# Patient Record
Sex: Female | Born: 1940 | Race: White | Hispanic: No | Smoking: Never smoker
Health system: Southern US, Community
[De-identification: ages and names within clinical notes are randomized; demographics above are authoritative.]

## PROBLEM LIST (undated history)

## (undated) DIAGNOSIS — D568 Other thalassemias: Secondary | ICD-10-CM

## (undated) DIAGNOSIS — Z9289 Personal history of other medical treatment: Secondary | ICD-10-CM

## (undated) DIAGNOSIS — E785 Hyperlipidemia, unspecified: Secondary | ICD-10-CM

## (undated) DIAGNOSIS — I350 Nonrheumatic aortic (valve) stenosis: Secondary | ICD-10-CM

## (undated) DIAGNOSIS — R0600 Dyspnea, unspecified: Secondary | ICD-10-CM

## (undated) DIAGNOSIS — I10 Essential (primary) hypertension: Secondary | ICD-10-CM

## (undated) DIAGNOSIS — D649 Anemia, unspecified: Secondary | ICD-10-CM

## (undated) DIAGNOSIS — Z85118 Personal history of other malignant neoplasm of bronchus and lung: Secondary | ICD-10-CM

## (undated) DIAGNOSIS — Z8489 Family history of other specified conditions: Secondary | ICD-10-CM

## (undated) DIAGNOSIS — M818 Other osteoporosis without current pathological fracture: Secondary | ICD-10-CM

## (undated) DIAGNOSIS — F419 Anxiety disorder, unspecified: Secondary | ICD-10-CM

## (undated) HISTORY — PX: LUMBAR DISC SURGERY: SHX700

## (undated) HISTORY — PX: PARTIAL HYSTERECTOMY: SHX80

## (undated) HISTORY — DX: Hyperlipidemia, unspecified: E78.5

## (undated) HISTORY — DX: Other osteoporosis without current pathological fracture: M81.8

## (undated) HISTORY — DX: Personal history of other malignant neoplasm of bronchus and lung: Z85.118

## (undated) HISTORY — DX: Nonrheumatic aortic (valve) stenosis: I35.0

## (undated) HISTORY — DX: Other thalassemias: D56.8

## (undated) HISTORY — DX: Essential (primary) hypertension: I10

## (undated) HISTORY — PX: INGUINAL HERNIA REPAIR: SUR1180

## (undated) HISTORY — PX: COLONOSCOPY: SHX174

---

## 1998-03-20 ENCOUNTER — Ambulatory Visit: Admission: RE | Admit: 1998-03-20 | Discharge: 1998-03-20 | Payer: Self-pay | Admitting: Internal Medicine

## 1999-09-17 ENCOUNTER — Ambulatory Visit (HOSPITAL_COMMUNITY): Admission: RE | Admit: 1999-09-17 | Discharge: 1999-09-17 | Payer: Self-pay | Admitting: Internal Medicine

## 1999-09-17 ENCOUNTER — Encounter: Payer: Self-pay | Admitting: Internal Medicine

## 2001-04-01 ENCOUNTER — Encounter: Payer: Self-pay | Admitting: Internal Medicine

## 2001-04-01 ENCOUNTER — Ambulatory Visit (HOSPITAL_COMMUNITY): Admission: RE | Admit: 2001-04-01 | Discharge: 2001-04-01 | Payer: Self-pay | Admitting: Internal Medicine

## 2001-08-25 ENCOUNTER — Emergency Department (HOSPITAL_COMMUNITY): Admission: EM | Admit: 2001-08-25 | Discharge: 2001-08-26 | Payer: Self-pay | Admitting: Emergency Medicine

## 2001-08-26 ENCOUNTER — Encounter: Payer: Self-pay | Admitting: Emergency Medicine

## 2002-08-24 ENCOUNTER — Ambulatory Visit (HOSPITAL_COMMUNITY): Admission: RE | Admit: 2002-08-24 | Discharge: 2002-08-24 | Payer: Self-pay | Admitting: Internal Medicine

## 2002-08-24 ENCOUNTER — Encounter: Payer: Self-pay | Admitting: Internal Medicine

## 2003-11-23 ENCOUNTER — Encounter: Payer: Self-pay | Admitting: Internal Medicine

## 2003-11-23 ENCOUNTER — Ambulatory Visit (HOSPITAL_COMMUNITY): Admission: RE | Admit: 2003-11-23 | Discharge: 2003-11-23 | Payer: Self-pay | Admitting: Internal Medicine

## 2004-09-11 ENCOUNTER — Ambulatory Visit (HOSPITAL_COMMUNITY): Admission: RE | Admit: 2004-09-11 | Discharge: 2004-09-11 | Payer: Self-pay | Admitting: Infectious Diseases

## 2006-10-13 LAB — HM COLONOSCOPY: HM Colonoscopy: NEGATIVE

## 2006-10-20 ENCOUNTER — Emergency Department (HOSPITAL_COMMUNITY): Admission: EM | Admit: 2006-10-20 | Discharge: 2006-10-20 | Payer: Self-pay | Admitting: Emergency Medicine

## 2007-12-27 ENCOUNTER — Encounter (INDEPENDENT_AMBULATORY_CARE_PROVIDER_SITE_OTHER): Payer: Self-pay | Admitting: Gastroenterology

## 2007-12-27 ENCOUNTER — Ambulatory Visit (HOSPITAL_COMMUNITY): Admission: RE | Admit: 2007-12-27 | Discharge: 2007-12-27 | Payer: Self-pay | Admitting: Gastroenterology

## 2009-10-15 ENCOUNTER — Encounter: Payer: Self-pay | Admitting: Internal Medicine

## 2009-11-27 ENCOUNTER — Ambulatory Visit: Payer: Self-pay | Admitting: Internal Medicine

## 2009-11-27 DIAGNOSIS — I10 Essential (primary) hypertension: Secondary | ICD-10-CM | POA: Insufficient documentation

## 2009-11-27 DIAGNOSIS — M542 Cervicalgia: Secondary | ICD-10-CM | POA: Insufficient documentation

## 2009-11-27 DIAGNOSIS — D568 Other thalassemias: Secondary | ICD-10-CM | POA: Insufficient documentation

## 2009-11-27 DIAGNOSIS — M818 Other osteoporosis without current pathological fracture: Secondary | ICD-10-CM | POA: Insufficient documentation

## 2009-11-29 ENCOUNTER — Encounter: Payer: Self-pay | Admitting: Internal Medicine

## 2009-12-11 ENCOUNTER — Telehealth: Payer: Self-pay | Admitting: Internal Medicine

## 2009-12-12 ENCOUNTER — Ambulatory Visit: Payer: Self-pay | Admitting: Infectious Diseases

## 2009-12-17 LAB — CONVERTED CEMR LAB
ALT: 11 units/L (ref 0–35)
AST: 23 units/L (ref 0–37)
Albumin: 4.4 g/dL (ref 3.5–5.2)
Alkaline Phosphatase: 52 units/L (ref 39–117)
BUN: 17 mg/dL (ref 6–23)
CO2: 29 meq/L (ref 19–32)
Calcium: 10.2 mg/dL (ref 8.4–10.5)
Chloride: 101 meq/L (ref 96–112)
Cholesterol: 227 mg/dL — ABNORMAL HIGH (ref 0–200)
Creatinine, Ser: 0.75 mg/dL (ref 0.40–1.20)
Glucose, Bld: 88 mg/dL (ref 70–99)
HCT: 37.8 % (ref 36.0–46.0)
HDL: 69 mg/dL (ref >39)
Hemoglobin: 12 g/dL (ref 12.0–15.0)
LDL Cholesterol: 134 mg/dL — ABNORMAL HIGH (ref 0–99)
MCHC: 31.7 g/dL (ref 30.0–36.0)
MCV: 64.5 fL — ABNORMAL LOW (ref >=78.0)
Platelets: 298 10*3/uL (ref 150–400)
Potassium: 4.1 meq/L (ref 3.5–5.3)
RBC: 5.86 M/uL — ABNORMAL HIGH (ref 3.87–5.11)
RDW: 16.2 % — ABNORMAL HIGH (ref 11.5–15.5)
Sodium: 139 meq/L (ref 135–145)
Total Bilirubin: 1.3 mg/dL — ABNORMAL HIGH (ref 0.3–1.2)
Total CHOL/HDL Ratio: 3.3
Total Protein: 7 g/dL (ref 6.0–8.3)
Triglycerides: 121 mg/dL (ref <150)
VLDL: 24 mg/dL (ref 0–40)
WBC: 6.1 10*3/uL (ref 4.0–10.5)

## 2009-12-21 ENCOUNTER — Encounter: Payer: Self-pay | Admitting: Internal Medicine

## 2010-02-22 LAB — HM MAMMOGRAPHY: HM Mammogram: NEGATIVE

## 2010-03-12 ENCOUNTER — Ambulatory Visit (HOSPITAL_COMMUNITY): Admission: RE | Admit: 2010-03-12 | Discharge: 2010-03-12 | Payer: Self-pay | Admitting: Internal Medicine

## 2010-05-14 ENCOUNTER — Ambulatory Visit: Payer: Self-pay | Admitting: Internal Medicine

## 2010-07-22 ENCOUNTER — Ambulatory Visit: Payer: Self-pay | Admitting: Internal Medicine

## 2010-09-03 ENCOUNTER — Ambulatory Visit: Payer: Self-pay | Admitting: Internal Medicine

## 2010-09-03 DIAGNOSIS — M549 Dorsalgia, unspecified: Secondary | ICD-10-CM | POA: Insufficient documentation

## 2010-09-04 LAB — CONVERTED CEMR LAB
ALT: 10 units/L (ref 0–35)
AST: 27 units/L (ref 0–37)
Albumin: 4.3 g/dL (ref 3.5–5.2)
Alkaline Phosphatase: 50 units/L (ref 39–117)
BUN: 17 mg/dL (ref 6–23)
CO2: 28 meq/L (ref 19–32)
Calcium: 9.7 mg/dL (ref 8.4–10.5)
Chloride: 99 meq/L (ref 96–112)
Creatinine, Ser: 0.8 mg/dL (ref 0.40–1.20)
Glucose, Bld: 87 mg/dL (ref 70–99)
Potassium: 4.3 meq/L (ref 3.5–5.3)
Sodium: 135 meq/L (ref 135–145)
Total Bilirubin: 1.3 mg/dL — ABNORMAL HIGH (ref 0.3–1.2)
Total Protein: 7 g/dL (ref 6.0–8.3)

## 2010-11-14 NOTE — Assessment & Plan Note (Signed)
Summary: FLU SHOT/CH  Nurse Visit   Allergies: No Known Drug Allergies  Immunizations Administered:  Influenza Vaccine # 1:    Vaccine Type: Fluvax MCR    Site: right deltoid    Mfr: GlaxoSmithKline    Dose: 0.5 ml    Route: IM    Given by: Angelina Ok RN    Exp. Date: 04/12/2011    Lot #: [id number]    VIS given: 05/07/10 version given July 23, 2010.  Flu Vaccine Consent Questions:    Do you have a history of severe allergic reactions to this vaccine? no    Any prior history of allergic reactions to egg and/or gelatin? no    Do you have a sensitivity to the preservative Thimersol? no    Do you have a past history of Guillan-Barre Syndrome? no    Do you currently have an acute febrile illness? no    Have you ever had a severe reaction to latex? no    Vaccine information given and explained to patient? yes    Are you currently pregnant? no  Orders Added: 1)  Influenza Vaccine MCR [00025]

## 2010-11-14 NOTE — Letter (Signed)
Summary: MAMMOGRAM  MAMMOGRAM   Imported By: Margie Billet 02/14/2010 11:43:21  _____________________________________________________________________  External Attachment:    Type:   Image     Comment:   External Document

## 2010-11-14 NOTE — Assessment & Plan Note (Signed)
Summary: CHECKUP/SB.   Vital Signs:  Patient profile:   70 year old female Height:      61 inches (154.94 cm) Weight:      126.0 pounds (57.27 kg) BMI:     23.89 Temp:     97.0 degrees F (36.11 degrees C) oral Pulse rate:   64 / minute BP sitting:   132 / 84  (left arm) Cuff size:   regular  Vitals Entered By: Cynda Familia Duncan Dull) (September 03, 2010 11:30 AM) CC: back pain  Is Patient Diabetic? No Pain Assessment Patient in pain? yes     Location: back pain Intensity: 4 Type: sharp Onset of pain  off and on x Nutritional Status BMI of 19 -24 = normal  Have you ever been in a relationship where you felt threatened, hurt or afraid?No   Does patient need assistance? Functional Status Self care Ambulation Normal   CC:  back pain .  History of Present Illness: 70 yr old who comes in for follow up and with new concerns.  1. HTN: She is just on HCTZ which usually controls it, but it was up a little when she first came in today.  2. Back pain: Both upper and lower back pain: Dawn Lin's husband decided to get more therapy for his cancer(s) and they have had side effects. Therefore she has been doing some lifting and a lot of cleaning of the bed, etc. She has a remote history of L5 disc surgery and had had some recurrent pain in that area. Now she has some pain in low back, L>R buttock, C spine. Lately this will sometimes keep her from getting a good night's sleep. No numbness or weakness in her arms or legs. She bought some aleve but wanted to make sure it was o.k. if she took it.   3. 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 As well she has not been exercising and she feels much better, mentally and physically, when she is exercising.   Depression History:      The patient denies a depressed mood most of the day and a diminished interest in her usual daily activities.         Preventive  Screening-Counseling & Management  Alcohol-Tobacco     Alcohol drinks/day: 0     Smoking Status: never  Current Medications (verified): 1)  Hydrochlorothiazide 25 Mg Tabs (Hydrochlorothiazide) .... One A Day 2)  Cyclobenzaprine Hcl 10 Mg Tabs (Cyclobenzaprine Hcl) .Marland Kitchen.. 1 Three Times A Day As Needed  Allergies (verified): No Known Drug Allergies  Past History:  Past Medical History: Reviewed history from 11/27/2009 and no changes required. unremarkablr and reviewed  Past Surgical History: Reviewed history from 11/27/2009 and no changes required. 1970's partial hysterectomy for bleeding and prolapse 1980's lumbar discectomy for severe pain and sciatica  Social History: Retired Married Never Smoked Alcohol use-no  Review of Systems General:  Denies fatigue, malaise, and weakness. CV:  Denies chest pain or discomfort and palpitations. Resp:  Denies cough and shortness of breath.  Physical Exam  General:  alert and well-developed.   Head:  normocephalic and atraumatic.   Eyes:  vision grossly intact.   Neck:  supple and no masses.   Lungs:  normal respiratory effort and normal breath sounds.   Heart:  normal rate, regular rhythm, and no murmur.   Abdomen:  soft, non-tender, and normal bowel sounds.   Msk:  normal ROM.   Extremities:  no edema   Detailed Back/Spine Exam  General:    no pain along the spinous processes, minimal tendernous along the paraspinal muscles in the C spine. negative SLR bilaterally   Impression & Recommendations:  Problem # 1:  BACK PAIN (ICD-724.5) I suspect this is secondary to some lifting and muscle spasm. She will begin flexeril 1 at night which will also help her to get a good night's sleep. As well starting back on regular aerobic exercise will help as well. I will see her back in a few weeks to see how she is doing. Her updated medication list for this problem includes:    Cyclobenzaprine Hcl 10 Mg Tabs (Cyclobenzaprine hcl) .Marland Kitchen...  1 three times a day as needed  Problem # 2:  ESSENTIAL HYPERTENSION, BENIGN (ICD-401.1) BP was up a little when first checked but on recheck it was normal. I have talked to her at length today about the importance of self care. She plans to start walking again today or tomorrow and to start back to [facility] on 09/12/10. Greater than 40 minutes spent with >50% spent with counseling and discussing the importance of this. Her updated medication list for this problem includes:    Hydrochlorothiazide 25 Mg Tabs (Hydrochlorothiazide) ..... One a day  Orders: T-CMP with Estimated GFR ([id number])  Complete Medication List: 1)  Hydrochlorothiazide 25 Mg Tabs (Hydrochlorothiazide) .... One a day 2)  Cyclobenzaprine Hcl 10 Mg Tabs (Cyclobenzaprine hcl) .Marland Kitchen.. 1 three times a day as needed  Patient Instructions: 1)  Please schedule a follow-up appointment in 5-6 weeks. 2)  Please take a flexeril every night for back pain and muscle spasm. 3)  Happy [employer] and walking. 4)  Take care! Prescriptions: HYDROCHLOROTHIAZIDE 25 MG TABS (HYDROCHLOROTHIAZIDE) one a day  #90 x 3   Entered and Authorized by:   Zoila Shutter MD   Signed by:   Zoila Shutter MD on 09/03/2010   Method used:   Electronically to        [facility] [phone]* (retail)       [address]       [place], [place]  [address]       Ph: [phone] or [phone]       Fax: [phone]   RxID:   [id number] CYCLOBENZAPRINE HCL 10 MG TABS (CYCLOBENZAPRINE HCL) 1 three times a day as needed  #40 x 4   Entered and Authorized by:   Zoila Shutter MD   Signed by:   Zoila Shutter MD on 09/03/2010   Method used:   Electronically to        [facility] [phone]* (retail)       [address]       [place], [place]  [address]       Ph: [phone] or [phone]       Fax: [phone]   RxID:   [id number]    Orders Added: 1)  T-CMP with Estimated GFR [[id number]] 2)  Est. Patient Level V  [28315] 3)  Est. Patient Level V [17616]   Process Orders Check Orders Results:     [facility]: ABN not required for this insurance Tests Sent for requisitioning (September 03, 2010 4:17 PM):     09/03/2010: [facility] -- T-CMP with Estimated GFR [[id number]] (signed)     Prevention & Chronic  Care Immunizations   Influenza vaccine: Fluvax MCR  (07/22/2010)    Tetanus booster: Not documented   Td booster deferral: Deferred  (11/27/2009)    Pneumococcal vaccine: Pneumovax  (11/27/2009)    H. zoster vaccine: Not documented  Colorectal Screening   Hemoccult: Not documented    Colonoscopy: Not documented  Other Screening   Pap smear: Not documented   Pap smear action/deferral: Not indicated S/P hysterectomy  (11/27/2009)    Mammogram: ASSESSMENT: Negative - BI-RADS 1^MM DIGITAL SCREENING  (03/12/2010)    DXA bone density scan: Not documented   Smoking status: never  (09/03/2010)  Lipids   Total Cholesterol: 227  (12/12/2009)   LDL: 134  (12/12/2009)   LDL Direct: Not documented   HDL: 69  (12/12/2009)   Triglycerides: 121  (12/12/2009)  Hypertension   Last Blood Pressure: 132 / 84  (09/03/2010)   Serum creatinine: 0.75  (12/12/2009)   BMP action: Deferred   Serum potassium 4.1  (12/12/2009)    Hypertension flowsheet reviewed?: Yes   Progress toward BP goal: At goal  Self-Management Support :   Personal Goals (by the next clinic visit) :      Personal blood pressure goal: 140/90  (05/14/2010)   Patient will work on the following items until the next clinic visit to reach self-care goals:     Medications and monitoring: take my medicines every day  (09/03/2010)     Eating: eat foods that are low in salt, eat baked foods instead of fried foods  (09/03/2010)     Activity: join a walking program  (05/14/2010)    Hypertension self-management support: Written self-care plan  (09/03/2010)   Hypertension self-care plan  printed.   Process Orders Check Orders Results:     [facility]: ABN not required for this insurance Tests Sent for requisitioning (September 03, 2010 4:17 PM):     09/03/2010: [facility] -- T-CMP with Estimated GFR [[id number]] (signed)

## 2010-11-14 NOTE — Progress Notes (Signed)
  Phone Note Call from Patient   Reason for Call: Lab or Test Results Summary of Call: Patient called in and sch an appointment for tomorrow for her labs.  Did not see a new order for any labs.  Patient states at last visit she was instructed to come back for some fasting labs.  Please put order in if needed. Initial call taken by: Shon Hough,  December 11, 2009 2:25 PM  Follow-up for Phone Call        will put in Follow-up by: Zoila Shutter MD,  December 11, 2009 4:50 PM

## 2010-11-14 NOTE — Miscellaneous (Signed)
Summary: RELEASE OF INFORMATION FORM  RELEASE OF INFORMATION FORM   Imported By: Margie Billet 01/10/2010 15:47:24  _____________________________________________________________________  External Attachment:    Type:   Image     Comment:   External Document

## 2010-11-14 NOTE — Miscellaneous (Signed)
Summary: HIPAA Restrictions  HIPAA Restrictions   Imported By: Florinda Marker 11/27/2009 14:53:39  _____________________________________________________________________  External Attachment:    Type:   Image     Comment:   External Document

## 2010-11-14 NOTE — Assessment & Plan Note (Signed)
Summary: NEW PT/OK'D BY MD/DS   Vital Signs:  Patient profile:   70 year old female Height:      61 inches (154.94 cm) Weight:      121.0 pounds (55.00 kg) BMI:     22.95 Temp:     96.7 degrees F oral Pulse rate:   60 / minute BP sitting:   136 / 76  (left arm)  Vitals Entered By: Chinita Pester RN (November 27, 2009 10:15 AM) CC: New  to MD/clinic.  Check-up. Neck stiffiness.  Hx ob back pain., Hypertension Management Is Patient Diabetic? No Pain Assessment Patient in pain? no      Nutritional Status BMI of 19 -24 = normal  Have you ever been in a relationship where you felt threatened, hurt or afraid?No   Does patient need assistance? Functional Status Self care Ambulation Normal   CC:  New  to MD/clinic.  Check-up. Neck stiffiness.  Hx ob back pain. and Hypertension Management.  History of Present Illness: 70 yr old patient who comes in to establish primary care. I know her because I care for her husband, Leoma Folds. As well, her sister is Harriett Sine in our office.  She has a history of HTN for about 20 years. She has beta thalassemia trait.  She also has osteoporosis and was given a medicine from [employer] that she got free and administered Subcutaneously every day for 13 months a couple of years ago. She did have a follow up bone density but said that the results were never clearly given to her.  She had a partial hysterectomy in the 70's, for bleeding and prolapse. in the 80's she had a lumbar diskectomy for severe pain and sciatica.  Her husband has been ill 6 years. He has metastic prostatic cancer and multiple myeloma. 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 She has alot of family support. She is sleeping, eating with WW help, likes to walk and plans to restart in the spring.  She is doing fairly well as far as her husbands terminal illness.  She did sign for old records from her last pratice but we have not been able to locate them.  She does  have some neck stiffness that she thinks is stress. There are no associated neurologic symptoms. Heat helps it feel better.  Hypertension History:      Positive major cardiovascular risk factors include female age 72 years old or older and hypertension.  Negative major cardiovascular risk factors include non-tobacco-user status.    Preventive Screening-Counseling & Management  Alcohol-Tobacco     Alcohol drinks/day: 0     Smoking Status: never  Caffeine-Diet-Exercise     Does Patient Exercise: yes     Type of exercise: walking  Current Medications (verified): 1)  Hydrochlorothiazide 25 Mg Tabs (Hydrochlorothiazide) .... One A Day  Allergies (verified): No Known Drug Allergies  Past History:  Past Medical History: unremarkablr and reviewed  Past Surgical History: 1970's partial hysterectomy for bleeding and prolapse 1980's lumbar discectomy for severe pain and sciatica  Family History: Reviewed history and no changes required.  Social History: Retired Married Never Smoked Alcohol use-no Smoking Status:  never Does Patient Exercise:  yes  Review of Systems General:  Denies fatigue, loss of appetite, sleep disorder, and weight loss. CV:  Denies chest pain or discomfort, palpitations, and shortness of breath with exertion. Resp:  Denies chest pain with inspiration, cough, and shortness of breath. GI:  Denies abdominal  pain, bloody stools, constipation, nausea, and vomiting. GU:  Denies abnormal vaginal bleeding. MS:  occasional neck pain helped with heat. Psych:  Denies anxiety and depression.  Physical Exam  General:  well-developed. well-healed scar beneath lower lip-faint  Head:  normocephalic and atraumatic.   Eyes:  vision grossly intact, pupils equal, and pupils round.   Ears:  no external deformities.   Nose:  no external deformity and no nasal discharge.   Mouth:  good dentition.   Neck:  supple and full ROM.   Lungs:  normal breath sounds.   Heart:   normal rate, regular rhythm, and no murmur.   Abdomen:  soft, non-tender, and normal bowel sounds.   Msk:  normal ROM. nopain along the C-spine but bilat. paraspinal muscle tenderness along C-spine Extremities:  no edema Neurologic:  alert & oriented X3.   Psych:  Oriented X3, normally interactive, and not depressed appearing.      Impression & Recommendations:  Problem # 1:  ESSENTIAL HYPERTENSION, BENIGN (ICD-401.1)  Greater than spent with >50% spent face to face with counseling and coordination of care discussing patients care, need for preventive medicine care, her past medical history, and especially discussing her self-care in the face of her husband's terminal illness and being told a couple months ago that he had less than 6 months to live. I think that she is doing well and she will let me know if I can help during this difficult time Her BP is well-controlled. She will return for fasting blood work. HCTZ was refilled for 1 year. Her updated medication list for this problem includes:    Hydrochlorothiazide 25 Mg Tabs (Hydrochlorothiazide) ..... 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 has beta thalassemia trait and can run a Hb around 11.   Orders: T-Lipid Profile ([id number]) T-CBC No Diff ([id number])  Problem # 3:  OTHER OSTEOPOROSIS (ICD-733.09) It sounds like she was treated aggressively for this. I will await old records to see results of last bone density and when it was done.  Problem # 4:  Preventive Health Care (ICD-V70.0) Flu shot and pneumovax done today. She has had a tetanus shot in the last 10 years.  She had a colonoscopy in 2008-2009 and was told she did not need another for 10years. She is due for a mammogram and will try to schedule it herself. She is s/p partial hysterectomy so does not need a pap.  Problem # 5:  NECK PAIN  (ICD-723.1) This is muscle strain and spasm and is probably related to stress, as this is a common area for it to go in woman. I have encouraged her to use the heat and do stretches.  Complete Medication List: 1)  Hydrochlorothiazide 25 Mg Tabs (Hydrochlorothiazide) .... One a day  Other Orders: Admin 1st Vaccine (63875) Flu Vaccine 14yrs + (64332) Pneumococcal Vaccine (95188) Admin of Any Addtl Vaccine (41660)  Hypertension Assessment/Plan:      The patient's hypertensive risk group is category B: At least one risk factor (excluding diabetes) with no target organ damage.  Today's blood pressure is 136/76.     Patient Instructions: 1)  Please schedule a follow-up appointment in 3 months. 2)  You got a flu shot and a pneumonia shot today. 3)  We will fax the hydrochlorothiazide presciption in with refills for a year. 4)  Let us know if you are unableto schedule your mammogram. 5)  We will wait for your old records to see when your last bone density was done. 6)  Keep taking good care of yourself.  7)  Come back in sometime soon for fasting bllod work. Prescriptions: HYDROCHLOROTHIAZIDE 25 MG TABS (HYDROCHLOROTHIAZIDE) one a day  #90 x 3   Entered and Authorized by:   Zoila Shutter MD   Signed by:   Zoila Shutter MD on 11/27/2009   Method used:   Faxed to ...       [facility] (mail-order)       [address]       [place], [place]  [address]       Ph: [phone]       Fax: [phone]   RxID:   [id number]   Prevention & Chronic Care Immunizations   Influenza vaccine: Fluvax 3+  (11/27/2009)    Tetanus booster: Not documented   Td booster deferral: Deferred  (11/27/2009)    Pneumococcal vaccine: Pneumovax  (11/27/2009)    H. zoster vaccine: Not documented  Colorectal Screening   Hemoccult: Not documented    Colonoscopy: Not documented  Other Screening   Pap smear: Not documented   Pap smear action/deferral: Not indicated S/P  hysterectomy  (11/27/2009)    Mammogram: Not documented    DXA bone density scan: Not documented   Smoking status: never  (11/27/2009)  Lipids   Total Cholesterol: Not documented   LDL: Not documented   LDL Direct: Not documented   HDL: Not documented   Triglycerides: Not documented  Hypertension   Last Blood Pressure: 136 / 76  (11/27/2009)   Serum creatinine: Not documented   BMP action: Deferred   Serum potassium Not documented CMP ordered     Hypertension flowsheet reviewed?: Yes   Progress toward BP goal: At goal  Self-Management Support :    Hypertension self-management support: Not documented   Nursing Instructions: Give Flu vaccine today Give Pneumovax today   Process Orders Check Orders Results:     [facility]: ABN not required for this insurance Tests Sent for requisitioning (November 27, 2009 12:28 PM):     11/27/2009: [facility] -- T-Comprehensive Metabolic Panel [[id number]] (signed)     11/27/2009: [facility] -- T-Lipid Profile [id number] (signed)     11/27/2009: [facility] -- T-CBC No Diff [[id number]] (signed)       Flu Vaccine Consent Questions     Do you have a history of severe allergic reactions to this vaccine? no    Any prior history of allergic reactions to egg and/or gelatin? no    Do you have a sensitivity to the preservative Thimersol? no    Do you have a past history of Guillan-Barre Syndrome? no    Do you currently have an acute febrile illness? no    Have you ever had a severe reaction to latex? no    Vaccine information given and explained to patient? yes    Are you currently pregnant? no    Lot [id number]   Exp Date:01/10/2010   Manufacturer: Novartis    Site Given  Left Deltoid IMchsflu Chinita Pester RN  November 27, 2009 11:35 AM    Pneumovax Vaccine    Vaccine Type: Pneumovax    Site: right deltoid    Mfr: Merck    Dose: 0.5 ml     Route: IM    Given by: Chinita Pester RN    Exp. Date: 05/18/2011  Lot #: [id number]    VIS given: 05/10/96 version given November 27, 2009.

## 2010-11-14 NOTE — Consult Note (Signed)
Summary: [facility]  [facility]   Imported By: Florinda Marker 11/30/2009 14:57:38  _____________________________________________________________________  External Attachment:    Type:   Image     Comment:   External Document  Appended Document: [facility] Can you let Dawn Lin know that I reviewed her records from her previous MD and they did not include the previous bone densities-which is the main thing tha we were waiting for. Therefore she probably needs to recontact them about those specifically.  Chart review: She did take Forteo in 2009, Simvastatin in the past, in 9/09 (on simvastatin) chol-244, LDL-153, HDL-66, TG-126.

## 2010-11-14 NOTE — Assessment & Plan Note (Signed)
Summary: Est-ck/fu/meds/cfb   Vital Signs:  Patient profile:   70 year old female Height:      61 inches (154.94 cm) Weight:      123.3 pounds (56.05 kg) BMI:     23.38 Temp:     97.2 degrees F (36.22 degrees C) oral Pulse rate:   63 / minute BP sitting:   136 / 70  (left arm) Cuff size:   regular  Vitals Entered By: Cynda Familia Duncan Dull) (May 14, 2010 9:26 AM) Is Patient Diabetic? No Pain Assessment Patient in pain? no      Nutritional Status BMI of 19 -24 = normal  Have you ever been in a relationship where you felt threatened, hurt or afraid?No   Does patient need assistance? Functional Status Self care Ambulation Normal   History of Present Illness: 70 year old who comes in for follow up of HTN, ostoeporosis, and situational stressors involving her  husband who has been ill with 2 cancers for 4 years.  We spent alot of time today talking about how she is doing. I am prould of her. [facility], leading [employer] classes, going to church, gardening and generally taking good care of herself.  Her hypertension is well controlled on just HCTZ. I went over her lipids with her today. Her HDL is good and I do not think she needs medication as her cholesterol/HDL ratio puts her at 1/2 the risk for CAD  disease.  As far as her osteoporosis we have still not gotten complete old records. From what I can tell, she was started on Forteo 6/09 and she says she took it for 13 months. The only bone density report that we received is from 9/09 which shows: spine-t score of -1.51, and femur-t score of -2.52. She is positive she had another bone density done after the course of forteo and she was told it was better. However at [facility] point she does not want to go on a bisphosphonate so she will just continue taking her calcium and doing upper body exercises.  Depression History:      The patient denies a depressed mood most of the day and a diminished interest in  her usual daily activities.         Current Medications (verified): 1)  Hydrochlorothiazide 25 Mg Tabs (Hydrochlorothiazide) .... One A Day  Allergies (verified): No Known Drug Allergies  Past History:  Past Medical History: Reviewed history from 11/27/2009 and no changes required. unremarkablr and reviewed  Past Surgical History: Reviewed history from 11/27/2009 and no changes required. 1970's partial hysterectomy for bleeding and prolapse 1980's lumbar discectomy for severe pain and sciatica  Social History: Retired Married Never Smoked Alcohol use-no 8/11-going to the [employer] and ecercising  Review of Systems       as per HPI  Physical Exam  General:  lovely, well-appearing Eyes:  vision grossly intact.   Neck:  supple, full ROM, and no masses.   Lungs:  normal respiratory effort and normal breath sounds.   Heart:  normal rate, regular rhythm, no murmur, and no gallop.   Abdomen:  soft, non-tender, and normal bowel sounds.   Extremities:  no edema   Impression & Recommendations:  Problem # 1:  ESSENTIAL HYPERTENSION, BENIGN (ICD-401.1) BP well controlled. continue just one agent. Her updated medication list for this problem includes:    Hydrochlorothiazide 25 Mg Tabs (Hydrochlorothiazide) ..... One a day  Problem # 2:  OTHER  OSTEOPOROSIS (ICD-733.09) As per note in HPI, will just continue calcium and upper body exercises for now, she is not interested in taking bisphosphonates.  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  Problem # 4:  Preventive Health Care (ICD-V70.0) She is up to date. She just had her mammogram done. Colonoscopy up to date. Will check fasting labs when she returns.  Complete Medication List: 1)  Hydrochlorothiazide 25 Mg Tabs (Hydrochlorothiazide) .... One a day  Patient Instructions: 1)  Please schedule a follow-up appointment in 3 months.   Prevention & Chronic Care Immunizations   Influenza vaccine: Fluvax 3+   (11/27/2009)    Tetanus booster: Not documented   Td booster deferral: Deferred  (11/27/2009)    Pneumococcal vaccine: Pneumovax  (11/27/2009)    H. zoster vaccine: Not documented  Colorectal Screening   Hemoccult: Not documented    Colonoscopy: Not documented  Other Screening   Pap smear: Not documented   Pap smear action/deferral: Not indicated S/P hysterectomy  (11/27/2009)    Mammogram: ASSESSMENT: Negative - BI-RADS 1^MM DIGITAL SCREENING  (03/12/2010)    DXA bone density scan: Not documented   Smoking status: never  (11/27/2009)  Lipids   Total Cholesterol: 227  (12/12/2009)   LDL: 134  (12/12/2009)   LDL Direct: Not documented   HDL: 69  (12/12/2009)   Triglycerides: 121  (12/12/2009)  Hypertension   Last Blood Pressure: 136 / 70  (05/14/2010)   Serum creatinine: 0.75  (12/12/2009)   BMP action: Deferred   Serum potassium 4.1  (12/12/2009)    Hypertension flowsheet reviewed?: Yes   Progress toward BP goal: At goal  Self-Management Support :   Personal Goals (by the next clinic visit) :      Personal blood pressure goal: 140/90  (05/14/2010)   Patient will work on the following items until the next clinic visit to reach self-care goals:     Medications and monitoring: take my medicines every day  (05/14/2010)     Eating: use fresh or frozen vegetables, eat foods that are low in salt  (05/14/2010)     Activity: join a walking program  (05/14/2010)    Hypertension self-management support: Pre-printed educational material  (05/14/2010)

## 2010-11-15 NOTE — Miscellaneous (Signed)
Summary: [facility]  [facility]   Imported By: Margie Billet 02/22/2010 11:58:00  _____________________________________________________________________  External Attachment:    Type:   Image     Comment:   External Document

## 2010-11-21 ENCOUNTER — Encounter: Payer: Self-pay | Admitting: Internal Medicine

## 2010-11-26 ENCOUNTER — Encounter: Payer: Self-pay | Admitting: Internal Medicine

## 2010-11-26 ENCOUNTER — Ambulatory Visit (INDEPENDENT_AMBULATORY_CARE_PROVIDER_SITE_OTHER): Payer: Medicare HMO | Admitting: Internal Medicine

## 2010-11-26 VITALS — BP 148/86 | HR 59 | Temp 97.0°F | Ht 60.0 in | Wt 128.7 lb

## 2010-11-26 DIAGNOSIS — M549 Dorsalgia, unspecified: Secondary | ICD-10-CM

## 2010-11-26 DIAGNOSIS — M542 Cervicalgia: Secondary | ICD-10-CM

## 2010-11-26 DIAGNOSIS — I10 Essential (primary) hypertension: Secondary | ICD-10-CM

## 2010-11-26 DIAGNOSIS — M818 Other osteoporosis without current pathological fracture: Secondary | ICD-10-CM

## 2010-11-26 DIAGNOSIS — E785 Hyperlipidemia, unspecified: Secondary | ICD-10-CM

## 2010-11-26 NOTE — Patient Instructions (Signed)
Remember you need to take care of YOU!!! PRESCRIPTION: [employer] at least 3 times a week! Get someone to stay with Ed while you Relax some as well.

## 2010-11-26 NOTE — Assessment & Plan Note (Signed)
This is improved and I think regular exercise will help as well. Continue flexeril prn.

## 2010-11-26 NOTE — Assessment & Plan Note (Signed)
Greater than 25 minutes spent with >50% spent with counseling discussing how important it is for Dawn Lin to take care of herself. Both her weight and her blood pressure are up. She is not taking time to care for herself because she is afraid to leave her husband alone. However I have told her I definitely think it is starting to affect her health. She has committed to going to the [employer] at least 3 times a week and getting someone to stay with her husband so she does not need to worry. She will continue to go to and lead her [employer]. She will follow up in 1 month for a quick visit to see how she doing and check her weight and blood pressure.

## 2010-11-26 NOTE — Progress Notes (Signed)
  Subjective:    Patient ID: Nicoletta Ba, female    DOB: 1941-07-13, 70 y.o.   MRN: [id number]  HPI 70 year old who comes in for follow up. Unfortunately she has not felt that she can take time for herself as her husband is chronically ill and he wants only her to care for her. She does have friends and family   This has started affecting her health. Both her weight and blood pressure are up today. She has been in and lead weight watchers for years and her weight is "almost 129" today which is very upsetting to her. Her BP which has always been well controlled but is also up today.    Her neck and back pain are better and the flexeril helps. She uses it prn. She still needs to lift her husband at times.     Review of Systems  Constitutional: Negative for fatigue.  Respiratory: Negative for shortness of breath.   Cardiovascular: Negative for chest pain.  Gastrointestinal: Negative for nausea, vomiting and abdominal pain.       Objective:   Physical Exam  Constitutional: She appears well-developed. No distress.  HENT:  Head: Normocephalic and atraumatic.  Cardiovascular: Normal rate, regular rhythm and normal heart sounds.   No murmur heard. Pulmonary/Chest: Breath sounds normal.  Abdominal: Soft. Bowel sounds are normal. There is no tenderness.  Musculoskeletal: Normal range of motion. She exhibits no edema.  Skin: Skin is warm and dry.  Psychiatric: She has a normal mood and affect.          Assessment & Plan:

## 2010-12-24 ENCOUNTER — Ambulatory Visit (INDEPENDENT_AMBULATORY_CARE_PROVIDER_SITE_OTHER): Payer: Medicare HMO | Admitting: Internal Medicine

## 2010-12-24 VITALS — BP 124/72 | HR 60 | Temp 97.1°F | Wt 129.2 lb

## 2010-12-24 DIAGNOSIS — I10 Essential (primary) hypertension: Secondary | ICD-10-CM

## 2010-12-24 NOTE — Progress Notes (Signed)
  Subjective:    Patient ID: Dawn Lin, female    DOB: 1941/04/02, 70 y.o.   MRN: [id number]  HPI Dawn Lin comes in for 1 month follow up. She is doing much better. She has been going to the [place] 3 times a week. She has been doing [employer], but is still "eating too many sweets!" Her husband has been fine at home. Her whole countenance is better.    Review of Systems  Constitutional: Negative for fatigue.  Respiratory: Negative for shortness of breath.   Cardiovascular: Negative for chest pain.  Gastrointestinal: Negative for nausea, vomiting and abdominal pain.       Objective:   Physical Exam  Constitutional: She appears well-developed. No distress.  HENT:  Head: Normocephalic and atraumatic.  Cardiovascular: Normal rate, regular rhythm and normal heart sounds.   No murmur heard. Pulmonary/Chest: Breath sounds normal.  Abdominal: Soft. Bowel sounds are normal. There is no tenderness.  Musculoskeletal: Normal range of motion. She exhibits no edema.  Skin: Skin is warm and dry.  Psychiatric: She has a normal mood and affect.          Assessment & Plan:

## 2010-12-24 NOTE — Patient Instructions (Signed)
Return to the clinic in 3 months. Please come fasting, nothing to eat after midnight. You can drink water, and black coffee if you like it. I am proud of you for taking care of yourself!!!

## 2010-12-24 NOTE — Assessment & Plan Note (Signed)
Dawn Lin's blood pressure is much better today, I got 124/72. I think a lot of this has to do with the fact that she is taking time to take care of herself. She is urged to continue doing so and I think that she will. Continue HCTZ. Labs were last checked 4 months ago. She will return in 3 months for follow up. I have asked her to come fasting that day.

## 2011-02-25 ENCOUNTER — Other Ambulatory Visit: Payer: Self-pay | Admitting: Internal Medicine

## 2011-02-25 DIAGNOSIS — Z139 Encounter for screening, unspecified: Secondary | ICD-10-CM

## 2011-02-25 NOTE — Op Note (Signed)
Dawn Lin, Dawn Lin           ACCOUNT NO.:  [id number]   MEDICAL RECORD NO.:  [id number]          PATIENT TYPE:  AMB   LOCATION:  ENDO                         FACILITY:  [facility]   PHYSICIAN:  Anselmo Rod, M.D.  DATE OF BIRTH:  1940-12-02   DATE OF PROCEDURE:  12/27/2007  DATE OF DISCHARGE:                               OPERATIVE REPORT   PROCEDURE PERFORMED:  Colonoscopy with cold biopsies x 5.   ENDOSCOPIST:  Dr. Anselmo Rod.   INSTRUMENT USED:  Pentax video colonoscope.   INDICATIONS FOR PROCEDURE:  A 70 year old, white female undergoing  screening colonoscopy. The patient has had worsening diarrhea in the  recent past.  She is not sure whether this is due to stress or GI  pathology.   PREPROCEDURE PREPARATION:  Informed consent was procured from the  patient. The patient fasted for 4 hours prior to the procedure and  prepped with 20 Osmoprep prep pills the night of and 12 OsmoPrep pills  the morning of the procedure. The risks and benefits of the procedure  including a 10% miss rate of cancer and polyp were discussed with the  patient as well.   PREPROCEDURE PHYSICAL:  The patient had stable vital signs.  Neck  supple.  Chest clear to auscultation.  S1, S2 regular.  Abdomen soft  with normal bowel sounds.   DESCRIPTION OF PROCEDURE:  The patient was placed in the left lateral  decubitus position and sedated with 70 mcg Fentanyl and 6 mg of Versed  given intravenously in slow incremental doses. Once the patient was  adequately sedated and maintained on low-flow oxygen and continuous  cardiac monitoring, the Pentax video colonoscope was advanced from the  rectum to the cecum with difficulty. The patient's position was changed  from the left lateral to the supine and the right lateral positions and  then back to the supine and the left lateral position with gentle  application of abdominal pressure to reach the cecal base. The  appendiceal orifice and cecal valve  were visualized and photographed  after multiple washes. There was some residual stool in the colon.  No  masses, polyps, erosions, ulcerations or diverticula were seen. Small  lesions could be missed. Cold biopsies were done x5 to rule out  microscopic colitis. The patient tolerated the procedure well and  without immediate complications.   IMPRESSION:  1. Very redundant tortuous colon. No masses, polyps, erosions,      ulcerations or diverticula noted.  2. Random biopsies done to rule out microscopic colitis.   RECOMMENDATIONS.:  1. Continue a high-fiber diet with liberal fluid intake.  2. Avoid all nonsteroidals including aspirin for the next 2 weeks.  3. Await pathology results.  4. Outpatient follow-up as need arises in the future.  5. Repeat colonoscopy has been recommended in the next 10 years unless      the patient has any abnormal symptoms in interim in which case she      should contact the office immediately for further recommendations.      Anselmo Rod, M.D.  Electronically Signed     JNM/MEDQ  D:  12/27/2007  T:  12/27/2007  Job:  [id number]   cc:   Merlene Laughter. Renae Gloss, M.D.  Fax: [phone]

## 2011-03-17 ENCOUNTER — Ambulatory Visit (HOSPITAL_COMMUNITY): Admission: RE | Admit: 2011-03-17 | Payer: Medicare HMO | Source: Ambulatory Visit

## 2011-03-25 ENCOUNTER — Ambulatory Visit: Payer: Medicare HMO | Admitting: Internal Medicine

## 2011-04-08 ENCOUNTER — Encounter: Payer: Self-pay | Admitting: Internal Medicine

## 2011-04-08 ENCOUNTER — Ambulatory Visit (INDEPENDENT_AMBULATORY_CARE_PROVIDER_SITE_OTHER): Payer: Medicare HMO | Admitting: Internal Medicine

## 2011-04-08 VITALS — BP 131/86 | HR 66 | Temp 97.8°F | Ht 60.0 in | Wt 127.7 lb

## 2011-04-08 DIAGNOSIS — F4321 Adjustment disorder with depressed mood: Secondary | ICD-10-CM | POA: Insufficient documentation

## 2011-04-08 DIAGNOSIS — I10 Essential (primary) hypertension: Secondary | ICD-10-CM

## 2011-04-08 NOTE — Assessment & Plan Note (Signed)
Blood pressure was well controlled when I rechecked it. Continue hydrochlorothiazide.

## 2011-04-08 NOTE — Assessment & Plan Note (Signed)
Dawn Lin Bible is adjusting well and seems to be doing well with an excellent support system. She will use the Flexeril for sleep. She plans to continue with her weight watchers and getting back to [employer] for exercise. I will see her back in 6 weeks to see how she's doing.

## 2011-04-08 NOTE — Progress Notes (Signed)
  Subjective:    Patient ID: Dawn Lin, female    DOB: [birth year], 70 y.o.   MRN: [id number]  HPIPat comes in today for followup. Her husband just passed away one week ago. He did have metastatic prostate cancer and multiple myeloma. He has lived over a year past which have been expected. He has had hospice at home and died comfortably. She is doing well she has not been sleeping too well at night but has Flexeril and that puts her to sleep well and she will use this for sleep. She has a lot of support with her 2 sisters and children as well as many friends and is doing well. Her husband has been very ill for 4 years so she has been grieving for a long time.   We spent a long time today talking about what she is going to do to take care of herself.    Review of Systems  Constitutional: Negative for fatigue.  Respiratory: Negative for shortness of breath.   Cardiovascular: Negative for chest pain.  Gastrointestinal: Negative for nausea, vomiting and abdominal pain.       Objective:   Physical Exam  Constitutional: She appears well-developed and well-nourished.  HENT:  Head: Normocephalic and atraumatic.  Pulmonary/Chest: Breath sounds normal.          Assessment & Plan:

## 2011-04-08 NOTE — Patient Instructions (Signed)
I will see you in 6 weeks. Take good care of yourself.

## 2011-05-27 ENCOUNTER — Ambulatory Visit (HOSPITAL_COMMUNITY)
Admission: RE | Admit: 2011-05-27 | Discharge: 2011-05-27 | Disposition: A | Discharge status: Home / Self Care | Payer: Medicare HMO | Source: Ambulatory Visit | Attending: Internal Medicine | Admitting: Internal Medicine

## 2011-05-27 DIAGNOSIS — Z1231 Encounter for screening mammogram for malignant neoplasm of breast: Secondary | ICD-10-CM | POA: Insufficient documentation

## 2011-05-27 DIAGNOSIS — Z139 Encounter for screening, unspecified: Secondary | ICD-10-CM

## 2011-06-03 ENCOUNTER — Ambulatory Visit (INDEPENDENT_AMBULATORY_CARE_PROVIDER_SITE_OTHER): Payer: Medicare HMO | Admitting: Internal Medicine

## 2011-06-03 ENCOUNTER — Encounter: Payer: Self-pay | Admitting: Internal Medicine

## 2011-06-03 VITALS — BP 158/76 | HR 77 | Temp 97.4°F | Wt 129.0 lb

## 2011-06-03 DIAGNOSIS — W57XXXA Bitten or stung by nonvenomous insect and other nonvenomous arthropods, initial encounter: Secondary | ICD-10-CM

## 2011-06-03 DIAGNOSIS — T148XXA Other injury of unspecified body region, initial encounter: Secondary | ICD-10-CM

## 2011-06-03 DIAGNOSIS — T148 Other injury of unspecified body region: Secondary | ICD-10-CM

## 2011-06-03 MED ORDER — HYDROCORTISONE 2.5 % EX CREA: TOPICAL_CREAM | Freq: Two times a day (BID) | CUTANEOUS | Status: DC

## 2011-06-03 NOTE — Patient Instructions (Signed)
You were seen today by Dr. Dorise Hiss for insect bites, they are caused by chiggers. To avoid further bites, after being outside take a hot shower within 4 hours. Try to avoid itching, use the hydrocortisone cream to reduce the itching. You have an appointment with your regular doctor, Dr. Coralee Pesa next Tuesday. Keep this appointment to check up on your other medical problems. If you have fevers, or worsening of your rash please feel free to call our office anytime. Our number is [phone].

## 2011-06-03 NOTE — Progress Notes (Signed)
  Subjective:    Patient ID: Dawn Lin, female    DOB: 03-Feb-1941, 70 y.o.   MRN: [id number]  HPI: Should the patient is a 70 year old female comes in today for an acute visit for some insect bites. She noticed about a week ago. They have been very pruritic. They are painless until she starts itching and then she gets a burning after spinach and them for a while. She was working outside before prior to getting these bites. She is not exposed any flea ridden dogs or cats. She has not had any other acute issues. Her husband did die recently and she's been working through that. She does appear to be doing well with the grief. She's had a lot of family support and friends. She has not had any fevers or chills at home.   Review of Systems  Constitutional: Negative for fever, chills, activity change, appetite change, fatigue and unexpected weight change.  HENT: Negative.   Eyes: Negative.   Respiratory: Negative.  Negative for cough, choking, chest tightness and shortness of breath.   Cardiovascular: Negative.  Negative for chest pain.  Gastrointestinal: Negative.  Negative for nausea, vomiting, diarrhea and constipation.  Musculoskeletal: Negative.   Skin: Positive for wound.  Neurological: Negative.   Hematological: Negative.   Psychiatric/Behavioral: Negative.     Vitals: Blood pressure: 158/76 Pulse: 77 Temperature 97.83F     Objective:   Physical Exam  Constitutional: She is oriented to person, place, and time. She appears well-developed and well-nourished.  HENT:  Head: Normocephalic and atraumatic.  Eyes: EOM are normal. Pupils are equal, round, and reactive to light.  Neck: Normal range of motion. Neck supple. No tracheal deviation present. No thyromegaly present.  Cardiovascular: Normal rate, regular rhythm and normal heart sounds.   Abdominal: Soft. Bowel sounds are normal. She exhibits no distension. There is no tenderness. There is no rebound and no guarding.    Musculoskeletal: Normal range of motion. She exhibits no edema and no tenderness.  Lymphadenopathy:    She has no cervical adenopathy.  Neurological: She is alert and oriented to person, place, and time. No cranial nerve deficit.  Skin: Skin is warm and dry.     Psychiatric: She has a normal mood and affect. Her behavior is normal. Judgment and thought content normal.          Assessment & Plan:   1. Insect bites-insect bites most likely caused by chiggers. I have advised the patient to take a really hot shower after exposure to the outdoors. I also advised her to clean any bedding or places that may have been exposed to bugs. I have given her hydrocortisone 2.5% cream to apply to the area to reduce the itching. I've advised her not to scratch if at all possible. She will followup with her PCP Dr. Coralee Pesa in one week for regular followup visit. I have told her that if she has increased fevers, chills, or worsening of the rash/bug bites she should call our office. I have given her our number.   2. Other medical problem is not addressed at this visit-hypertension, thalassemia trait, hyperlipidemia, neck/back pain.

## 2011-06-05 NOTE — Progress Notes (Signed)
Patient seen and examined. I agree with Dr. Lavonna Monarch assessment and plan.

## 2011-06-10 ENCOUNTER — Ambulatory Visit (INDEPENDENT_AMBULATORY_CARE_PROVIDER_SITE_OTHER): Payer: Medicare HMO | Admitting: Internal Medicine

## 2011-06-10 ENCOUNTER — Encounter: Payer: Self-pay | Admitting: Internal Medicine

## 2011-06-10 VITALS — BP 124/78 | HR 68 | Wt 128.6 lb

## 2011-06-10 DIAGNOSIS — E785 Hyperlipidemia, unspecified: Secondary | ICD-10-CM

## 2011-06-10 DIAGNOSIS — Z0000 Encounter for general adult medical examination without abnormal findings: Secondary | ICD-10-CM | POA: Insufficient documentation

## 2011-06-10 DIAGNOSIS — M818 Other osteoporosis without current pathological fracture: Secondary | ICD-10-CM

## 2011-06-10 DIAGNOSIS — I10 Essential (primary) hypertension: Secondary | ICD-10-CM

## 2011-06-10 DIAGNOSIS — Z23 Encounter for immunization: Secondary | ICD-10-CM

## 2011-06-10 NOTE — Assessment & Plan Note (Signed)
We discussed her doing upper body exercises as well at the [facility], in addition to her swimming, which will help with her bones. Again she is not interested in medication for this.

## 2011-06-10 NOTE — Patient Instructions (Signed)
I will see you back in 3 months. Happy travels!

## 2011-06-10 NOTE — Assessment & Plan Note (Signed)
Well controlled on 1agent. Also Weight watchers and exercise are helping. She is commended on good health habits and to continue.

## 2011-06-10 NOTE — Progress Notes (Signed)
  Subjective:    Patient ID: Dawn Lin, female    DOB: September 18, 1941, 70 y.o.   MRN: [id number]  HPI Dawn Lin returns for follow up of HTN. As well she was seen last week for a rash that was probably secondary to chiggers. The rash has resolved. It was helped symptomatically with the steroid cream.  As well she is doing well with her grieving. Her husband had been sick for many years. She has many trips planned and is very active with [employer] and the [facility].  As far as health maintenance she has not had a tetanus shot for at least 8 years and does want a flu shot today. She had been in a short study for forteo at her last md's office but does not want to go back on medication for that. She is otherwise up to date. She is fasting today and lipids have not been checked since 3/11.   Review of Systems  Constitutional: Negative for fatigue.  Respiratory: Negative for shortness of breath.   Cardiovascular: Negative for chest pain.  Gastrointestinal: Negative for nausea, vomiting and abdominal pain.       Objective:   Physical Exam  Constitutional: She appears well-developed. No distress.  HENT:  Head: Normocephalic and atraumatic.  Cardiovascular: Normal rate, regular rhythm and normal heart sounds.   No murmur heard. Pulmonary/Chest: Breath sounds normal.  Abdominal: Soft. Bowel sounds are normal. There is no tenderness.  Musculoskeletal: Normal range of motion. She exhibits no edema.  Skin: Skin is warm and dry.  Psychiatric: She has a normal mood and affect.          Assessment & Plan:

## 2011-06-10 NOTE — Assessment & Plan Note (Signed)
Tdap and flu shot given today. Fasting lipid profile and Cmet drawn. Follow up in 3 months.

## 2011-06-11 ENCOUNTER — Encounter: Payer: Self-pay | Admitting: *Deleted

## 2011-06-11 LAB — LIPID PANEL
Cholesterol: 222 mg/dL — ABNORMAL HIGH (ref 0–200)
HDL: 70 mg/dL (ref >39)
LDL Cholesterol: 134 mg/dL — ABNORMAL HIGH (ref 0–99)
Total CHOL/HDL Ratio: 3.2 Ratio
Triglycerides: 90 mg/dL (ref <150)
VLDL: 18 mg/dL (ref 0–40)

## 2011-06-11 LAB — COMPREHENSIVE METABOLIC PANEL
ALT: 9 U/L (ref 0–35)
AST: 23 U/L (ref 0–37)
Albumin: 4.3 g/dL (ref 3.5–5.2)
Alkaline Phosphatase: 48 U/L (ref 39–117)
BUN: 15 mg/dL (ref 6–23)
CO2: 25 mEq/L (ref 19–32)
Calcium: 9.5 mg/dL (ref 8.4–10.5)
Chloride: 104 mEq/L (ref 96–112)
Creat: 0.78 mg/dL (ref 0.50–1.10)
Glucose, Bld: 87 mg/dL (ref 70–99)
Potassium: 3.8 mEq/L (ref 3.5–5.3)
Sodium: 140 mEq/L (ref 135–145)
Total Bilirubin: 1.4 mg/dL — ABNORMAL HIGH (ref 0.3–1.2)
Total Protein: 6.7 g/dL (ref 6.0–8.3)

## 2011-07-23 ENCOUNTER — Telehealth: Payer: Self-pay | Admitting: *Deleted

## 2011-07-23 NOTE — Telephone Encounter (Signed)
Pt calls and states for appr 2 weeks she has had low back R hip pain, causing sleep disturbance. She request an appt and is given 10/18 w/ dr Loistine Chance.

## 2011-07-28 ENCOUNTER — Ambulatory Visit (INDEPENDENT_AMBULATORY_CARE_PROVIDER_SITE_OTHER): Payer: Medicare HMO | Admitting: Internal Medicine

## 2011-07-28 ENCOUNTER — Encounter: Payer: Self-pay | Admitting: Internal Medicine

## 2011-07-28 VITALS — BP 157/74 | HR 64 | Temp 97.6°F | Ht 60.0 in

## 2011-07-28 DIAGNOSIS — M5430 Sciatica, unspecified side: Secondary | ICD-10-CM

## 2011-07-28 DIAGNOSIS — B351 Tinea unguium: Secondary | ICD-10-CM | POA: Insufficient documentation

## 2011-07-28 MED ORDER — NAPROXEN 250 MG PO TABS: 250.0000 mg | ORAL_TABLET | Freq: Two times a day (BID) | ORAL | Status: AC

## 2011-07-28 NOTE — Progress Notes (Signed)
  Subjective:   Patient ID: Dawn Lin female   DOB: 03-16-1941 70 y.o.   MRN: [id number]  HPI: Dawn Lin is a 70 y.o. with PMH significant as outlined who presented to the clinic with  Buttock pain.  The pain started worsening since 2 weeks after she was working in the yard pulling ivy. The pain is  aching in character and radiates down to the right leg. She further noticed tingling in her foot. No fevers or chills, weakness. No changes in bowel habits or urinary habits. No recent history trauma. Taking aleve  which gave her some relieve.  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 The pain at that time was so sever that she had to crawl on the floor.  Right thumb: Noted some discoloration on the nail likely fungus infection.     Past Medical History  Diagnosis Date  . 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 Other osteoporosis   . Other thalassemia    Current Outpatient Prescriptions  Medication Sig Dispense Refill  . cyclobenzaprine (FLEXERIL) 10 MG tablet Take 10 mg by mouth 3 (three) times daily as needed.        . hydrochlorothiazide 25 MG tablet Take 25 mg by mouth daily.        . hydrocortisone (HYTONE) 2.5 % cream Apply topically 2 (two) times daily.  30 g  0   Review of Systems: Constitutional: Denies fever, chills, diaphoresis, appetite change and fatigue.  HEENT: Denies photophobia, eye pain, redness, hearing loss, ear pain, congestion, sore throat, rhinorrhea, sneezing, mouth sores, trouble swallowing, neck pain, neck stiffness and tinnitus.   Respiratory: Denies SOB, DOE, cough, chest tightness,  and wheezing.   Cardiovascular: Denies chest pain, palpitations and leg swelling.  Gastrointestinal: Denies nausea, vomiting, abdominal pain, diarrhea, constipation, blood in stool and abdominal distention.  Genitourinary: Denies dysuria, urgency, frequency, hematuria, flank pain and difficulty urinating.    Skin: Denies pallor, rash and wound.  Neurological: Denies dizziness, seizures, syncope, weakness, light-headedness, numbness and headaches.  Hematological: Denies adenopathy. Easy bruising, personal or family bleeding history    Objective:  Physical Exam: Filed Vitals:   07/28/11 1351  BP: 157/74  Pulse: 64  Temp: 97.6 F (36.4 C)  TempSrc: Oral  Height: 5' (1.524 m)   Constitutional: Vital signs reviewed.  Patient is a well-developed and well-nourished in no acute distress and cooperative with exam. Alert and oriented x3.   Neck: Supple.  Cardiovascular: RRR, S1 normal, S2 normal, no MRG, pulses symmetric and intact bilaterally Pulmonary/Chest: CTAB, no wheezes, rales, or rhonchi Abdominal: Soft. Non-tender, non-distended, bowel sounds are normal, no masses, organomegaly, or guarding present.  GU: no CVA tenderness Musculoskeletal: tenderness on palpation in the right buttocks area, positive straight leg test , no weakness noted.  Neurological: A&O x3, Strenght is normal and symmetric bilaterally, no focal motor deficit, sensory intact to light touch bilaterally.  Skin: Warm, dry and intact. No rash, cyanosis, or clubbing.

## 2011-07-28 NOTE — Patient Instructions (Signed)
Sciatica with Rehab   The sciatic nerve runs from the back down the leg and is responsible for sensation and control of the muscles in the back (posterior) side of the thigh, lower leg, and foot. Sciatica is a condition that is characterized by inflammation of this nerve.     SYMPTOMS  Signs of nerve damage, including numbness and/or weakness along the posterior side of the lower extremity.  Pain in the back of the thigh that may also travel down the leg.  Pain that worsens when sitting for long periods of time.  Occasionally, pain in the back or buttock.   CAUSES Inflammation of the sciatic nerve is the cause of sciatica. The inflammation is due to something irritating the nerve. Common sources of irritation include:  Sitting for long periods of time.  Direct trauma to the nerve.  Arthritis of the spine.  Herniated or ruptured disk.  Slipping of the vertebrae (spondylolithesis )  Pressure from soft tissues, such as muscles or ligament-like tissue (fascia).   RISK INCREASES WITH    Sports that place pressure or stress on the spine (football or weightlifting).  Poor strength and flexibility.  Failure to warm-up properly before activity.  Family history of low back pain or disk disorders.  Previous back injury or surgery.  Poor body mechanics, especially when lifting, or poor posture.   PREVENTIVE MEASURES    Warm up and stretch properly before activity.  Maintain physical fitness: l Strength, flexibility, and endurance.  Cardiovascular fitness.  Learn and use proper technique, especially with posture and lifting. When possible, have coach correct improper technique.  Avoid activities that place stress on the spine.   EXPECTED OUTCOME If treated properly, then sciatica usually resolves within 6 weeks. However, occasionally surgery is necessary.     POSSIBLE COMPLICATIONS    Permanent nerve damage, including pain, numbness, tingle, or weakness.  Chronic  back pain.  Risks of surgery: infection, bleeding, nerve damage, or damage to surrounding tissues.   GENERAL TREATMENT CONSIDERATIONS   Treatment initially involves resting from any activities that aggravate your symptoms. The use of ice and medication may help reduce pain and inflammation. The use of strengthening and stretching exercises may help reduce pain with activity. These exercises may be performed at home or with referral to a therapist. A therapist may recommend further treatments, such as transcutaneous electronic nerve stimulation (TENS) or ultrasound.  Your caregiver may recommend corticosteroid injections to help reduce inflammation of the sciatic nerve. If symptoms persist despite non-surgical (conservative) treatment, then surgery may be recommended.   MEDICATION:    If pain medication is necessary, then nonsteroidal anti-inflammatory medications, such as aspirin and ibuprofen, or other minor pain relievers, such as acetaminophen, are often recommended.    Do not take pain medication for 7 days before surgery.    Prescription pain relievers may be given if deemed necessary by your caregiver. Use only as directed and only as much as you need.  Ointments applied to the skin may be helpful.  Corticosteroid injections may be given by your caregiver. These injections should be reserved for the most serious cases, because they may only be given a certain number of times.   HEAT AND COLD:    Cold treatment (icing) relieves pain and reduces inflammation. Cold treatment should be applied for 10 to 15 minutes every 2 to 3 hours for inflammation and pain and immediately after any activity that aggravates your symptoms. Use ice packs or massage the area with a  piece of ice (ice massage).  Heat treatment may be used prior to performing the stretching and strengthening activities prescribed by your caregiver, physical therapist, or athletic trainer. Use a heat pack or soak the injury in  warm water.   SEEK MEDICAL TREATMENT IF:  Treatment seems to offer no benefit, or the condition worsens.  Any medications produce adverse side effects.   EXERCISES    RANGE OF MOTION AND STRETCHING EXERCISES - Sciatica Most people with sciatic will find that their symptoms worsen with either excessive bending forward (flexion) or arching at the low back (extension). The exercises which will help resolve your symptoms will focus on the opposite motion. Your physician, physical therapist or athletic trainer will help you determine which exercises will be most helpful to resolve your low back pain. Do not complete any exercises without first consulting with your clinician. Discontinue any exercises which worsen your symptoms until you speak to your clinician. If you have pain, numbness or tingling which travels down into your buttocks, leg or foot, the goal of the therapy is for these symptoms to move closer to your back and eventually resolve. Occasionally, these leg symptoms will get better, but your low back pain may worsen; this is typically an indication of progress in your rehabilitation. Be certain to be very alert to any changes in your symptoms and the activities in which you participated in the 24 hours prior to the change. Sharing this information with your clinician will allow him/her to most efficiently treat your condition.   These exercises may help you when beginning to rehabilitate your injury. Your symptoms may resolve with or without further involvement from your physician, physical therapist or athletic trainer. While completing these exercises, remember:    Restoring tissue flexibility helps normal motion to return to the joints. This allows healthier, less painful movement and activity.  An effective stretch should be held for at least 30 seconds.  A stretch should never be painful. You should only feel a gentle lengthening or release in the stretched tissue.       FLEXION  RANGE OF MOTION AND STRETCHING EXERCISES:   STRETCH - Flexion, Single Knee to Chest   Lie on a firm bed or floor with both legs extended in front of you.  Keeping one leg in contact with the floor, bring your opposite knee to your chest. Hold your leg in place by either grabbing behind your thigh or at your knee.  Pull until you feel a gentle stretch in your low back. Hold __________ seconds.  Slowly release your grasp and repeat the exercise with the opposite side. Repeat __________ times. Complete this exercise __________ times per day.       STRETCH - Flexion, Double Knee to Chest    Lie on a firm bed or floor with both legs extended in front of you.  Keeping one leg in contact with the floor, bring your opposite knee to your chest.    Tense your stomach muscles to support your back and then lift your other knee to your chest. Hold your legs in place by either grabbing behind your thighs or at your knees.  Pull both knees toward your chest until you feel a gentle stretch in your low back. Hold __________ seconds.  Tense your stomach muscles and slowly return one leg at a time to the floor. Repeat __________ times. Complete this exercise __________ times per day.       STRETCH - Low Trunk Rotation  Lie on a firm bed or floor. Keeping your legs in front of you, bend your knees so they are both pointed toward the ceiling and your feet are flat on the floor.  Extend your arms out to the side. This will stabilize your upper body by keeping your shoulders in contact with the floor.  Gently and slowly drop both knees together to one side until you feel a gentle stretch in your low back. Hold for __________ seconds.    Tense your stomach muscles to support your low back as you bring your knees back to the starting position.  Repeat the exercise to the other side. Repeat __________ times. Complete this exercise __________ times per day       EXTENSION RANGE OF MOTION AND  FLEXIBILITY EXERCISES:   STRETCH - Extension, Prone on Elbows    Lie on your stomach on the floor, a bed will be too soft. Place your palms about shoulder width apart and at the height of your head.  Place your elbows under your shoulders. If this is too painful, stack pillows under your chest.  Allow your body to relax so that your hips drop lower and make contact more completely with the floor.  Hold this position for __________ seconds.  Slowly return to lying flat on the floor. Repeat __________ times. Complete this exercise __________ times per day.       RANGE OF MOTION - Extension, Prone Press Ups    Lie on your stomach on the floor, a bed will be too soft. Place your palms about shoulder width apart and at the height of your head.  Keeping your back as relaxed as possible, slowly straighten your elbows while keeping your hips on the floor. You may adjust the placement of your hands to maximize your comfort. As you gain motion, your hands will come more underneath your shoulders.  Hold this position __________ seconds.  Slowly return to lying flat on the floor. Repeat __________ times. Complete this exercise __________ times per day.       STRENGTHENING EXERCISES - Sciatica  These exercises may help you when beginning to rehabilitate your injury. These exercises should be done near your "sweet spot." This is the neutral, low-back arch, somewhere between fully rounded and fully arched, that is your least painful position. When performed in this safe range of motion, these exercises can be used for people who have either a flexion or extension based injury. These exercises may resolve your symptoms with or without further involvement from your physician, physical therapist or athletic trainer. While completing these exercises, remember:    Muscles can gain both the endurance and the strength needed for everyday activities through controlled exercises.  Complete these exercises  as instructed by your physician, physical therapist or athletic trainer. Progress with the resistance and repetition exercises only as your caregiver advises.  You may experience muscle soreness or fatigue, but the pain or discomfort you are trying to eliminate should never worsen during these exercises.  If this pain does worsen, stop and make certain you are following the directions exactly. If the pain is still present after adjustments, discontinue the exercise until you can discuss the trouble with your clinician.    STRENGTHENING - Deep Abdominals, Pelvic Tilt   Lie on a firm bed or floor. Keeping your legs in front of you, bend your knees so they are both pointed toward the ceiling and your feet are flat on the floor.  Tense your lower  abdominal muscles to press your low back into the floor. This motion will rotate your pelvis so that your tail bone is scooping upwards rather than pointing at your feet or into the floor.  With a gentle tension and even breathing, hold this position for __________ seconds. Repeat __________ times. Complete this exercise __________ times per day.       STRENGTHENING - Abdominals, Crunches   Lie on a firm bed or floor. Keeping your legs in front of you, bend your knees so they are both pointed toward the ceiling and your feet are flat on the floor. Cross your arms over your chest.    Slightly tip your chin down without bending your neck.  Tense your abdominals and slowly lift your trunk high enough to just clear your shoulder blades. Lifting higher can put excessive stress on the low back and does not further strengthen your abdominal muscles.  Control your return to the starting position. Repeat __________ times. Complete this exercise __________ times per day.       STRENGTHENING - Quadruped, Opposite UE/LE Lift    Assume a hands and knees position on a firm surface. Keep your hands under your shoulders and your knees under your hips. You may place  padding under your knees for comfort.    Find your neutral spine and gently tense your abdominal muscles so that you can maintain this position. Your shoulders and hips should form a rectangle that is parallel with the floor and is not twisted.    Keeping your trunk steady, lift your right hand no higher than your shoulder and then your left leg no higher than your hip. Make sure you are not holding your breath. Hold this position __________ seconds.  Continuing to keep your abdominal muscles tense and your back steady, slowly return to your starting position. Repeat with the opposite arm and leg. Repeat __________ times. Complete this exercise __________ times per day.       STRENGTHENING - Abdominals and Quadriceps, Straight Leg Raise   Lie on a firm bed or floor with both legs extended in front of you.  Keeping one leg in contact with the floor, bend the other knee so that your foot can rest flat on the floor.  Find your neutral spine, and tense your abdominal muscles to maintain your spinal position throughout the exercise.  Slowly lift your straight leg off the floor about 6 inches for a count of 15, making sure to not hold your breath.  Still keeping your neutral spine, slowly lower your leg all the way to the floor.   Repeat this exercise with each leg __________ times. Complete this exercise __________ times per day.     POSTURE AND BODY MECHANICS CONSIDERATIONS - Sciatica Keeping correct posture when sitting, standing or completing your activities will reduce the stress put on different body tissues, allowing injured tissues a chance to heal and limiting painful experiences. The following are general guidelines for improved posture. Your physician or physical therapist will provide you with any instructions specific to your needs. While reading these guidelines, remember:  The exercises prescribed by your provider will help you have the flexibility and strength to maintain correct  postures.  The correct posture provides the optimal environment for your joints to work. All of your joints have less wear and tear when properly supported by a spine with good posture. This means you will experience a healthier, less painful body.  Correct posture must be practiced with all  of your activities, especially prolonged sitting and standing.  Correct posture is as important when doing repetitive low-stress activities (typing) as it is when doing a single heavy-load activity (lifting).       RESTING POSITIONS Consider which positions are most painful for you when choosing a resting position. If you have pain with flexion-based activities (sitting, bending, stooping, squatting), choose a position that allows you to rest in a less flexed posture. You would want to avoid curling into a fetal position on your side. If your pain worsens with extension-based activities (prolonged standing, working overhead), avoid resting in an extended position such as sleeping on your stomach. Most people will find more comfort when they rest with their spine in a more neutral position, neither too rounded nor too arched. Lying on a non-sagging bed on your side with a pillow between your knees, or on your back with a pillow under your knees will often provide some relief. Keep in mind, being in any one position for a prolonged period of time, no matter how correct your posture, can still lead to stiffness.  PROPER SITTING POSTURE In order to minimize stress and discomfort on your spine, you must sit with correct posture Sitting with good posture should be effortless for a healthy body.  Returning to good posture is a gradual process. Many people can work toward this most comfortably by using various supports until they have the flexibility and strength to maintain this posture on their own.   When sitting with proper posture, your ears will fall over your shoulders and your shoulders will fall over your hips. You  should use the back of the chair to support your upper back. Your low back will be in a neutral position, just slightly arched. You may place a small pillow or folded towel at the base of your low back for support.     When working at a desk, create an environment that supports good, upright posture. Without extra support, muscles fatigue and lead to excessive strain on joints and other tissues.  Keep these recommendations in mind:  CHAIR:    A chair should be able to slide under your desk when your back makes contact with the back of the chair. This allows you to work closely.  The chair's height should allow your eyes to be level with the upper part of your monitor and your hands to be slightly lower than your elbows.  BODY POSITION  Your feet should make contact with the floor. If this is not possible, use a foot rest.  Keep your ears over your shoulders. This will reduce stress on your neck and low back.     INCORRECT SITTING POSTURES   If you are feeling tired and unable to assume a healthy sitting posture, do not slouch or slump. This puts excessive strain on your back tissues, causing more damage and pain. Healthier options include:  Using more support, like a lumbar pillow.  Switching tasks to something that requires you to be upright or walking.  Talking a brief walk.  Lying down to rest in a neutral-spine position.  PROLONGED STANDING WHILE SLIGHTLY LEANING FORWARD  When completing a task that requires you to lean forward while standing in one place for a long time, place either foot up on a stationary 2-4 inch high object to help maintain the best posture. When both feet are on the ground, the low back tends to lose its slight inward curve.  If this curve flattens (  or becomes too large), then the back and your other joints will experience too much stress, fatigue more quickly and can cause pain.    CORRECT STANDING POSTURES Proper standing posture should be assumed with  all daily activities, even if they only take a few moments, like when brushing your teeth. As in sitting, your ears should fall over your shoulders and your shoulders should fall over your hips. You should keep a slight tension in your abdominal muscles to brace your spine. Your tailbone should point down to the ground, not behind your body, resulting in an over-extended swayback posture.     INCORRECT STANDING POSTURES  Common incorrect standing postures include a forward head, locked knees and/or an excessive swayback.  WALKING Walk with an upright posture. Your ears, shoulders and hips should all line-up.  PROLONGED ACTIVITY IN A FLEXED POSITION When completing a task that requires you to bend forward at your waist or lean over a low surface, try to find a way to stabilize 3 of 4 of your limbs. You can place a hand or elbow on your thigh or rest a knee on the surface you are reaching across. This will provide you more stability so that your muscles do not fatigue as quickly. By keeping your knees relaxed, or slightly bent, you will also reduce stress across your low back.    CORRECT LIFTING TECHNIQUES DO :   Assume a wide stance. This will provide you more stability and the opportunity to get as close as possible to the object which you are lifting.  Tense your abdominals to brace your spine; then bend at the knees and hips. Keeping your back locked in a neutral-spine position, lift using your leg muscles. Lift with your legs, keeping your back straight.  Test the weight of unknown objects before attempting to lift them.  Try to keep your elbows locked down at your sides in order get the best strength from your shoulders when carrying an object.  Always ask for help when lifting heavy or awkward objects.  INCORRECT LIFTING TECHNIQUES DO NOT:   Lock your knees when lifting, even if it is a small object.  Bend and twist. Pivot at your feet or move your feet when needing to change  directions.  Assume that you cannot safely pick up a paperclip without proper posture.    Document Released: 09/29/2005  Document Re-Released: 10/21/2009 [facility] Patient Information 2011 [place], [place].

## 2011-07-28 NOTE — Progress Notes (Deleted)
  Subjective:    Patient ID: Dawn Lin, female    DOB: 01/31/1941, 70 y.o.   MRN: [id number]  HPI    Review of Systems     Objective:   Physical Exam        Assessment & Plan:

## 2011-07-28 NOTE — Assessment & Plan Note (Signed)
Symptoms likely Sciatica . Will treat conservatively . Start naproxen BID and flexeril for 2 weeks. Provided exercise information. Will evaluated in 2 weeks. Informed the patient if she notices worsening symptoms , weakness changes in urinary or bowel habits she needs to be evaluated immediately either in the clinic or in the ED.

## 2011-07-28 NOTE — Assessment & Plan Note (Signed)
Will treat conservatively.

## 2011-08-12 ENCOUNTER — Encounter: Payer: Self-pay | Admitting: Internal Medicine

## 2011-08-12 ENCOUNTER — Ambulatory Visit (INDEPENDENT_AMBULATORY_CARE_PROVIDER_SITE_OTHER): Payer: Medicare HMO | Admitting: Internal Medicine

## 2011-08-12 VITALS — BP 161/80 | HR 77 | Temp 97.1°F | Wt 130.8 lb

## 2011-08-12 DIAGNOSIS — M818 Other osteoporosis without current pathological fracture: Secondary | ICD-10-CM

## 2011-08-12 DIAGNOSIS — M545 Low back pain, unspecified: Secondary | ICD-10-CM | POA: Insufficient documentation

## 2011-08-12 DIAGNOSIS — I10 Essential (primary) hypertension: Secondary | ICD-10-CM

## 2011-08-12 MED ORDER — HYDROCODONE-ACETAMINOPHEN 5-500 MG PO TABS: 1.0000 | ORAL_TABLET | Freq: Four times a day (QID) | ORAL | Status: AC | PRN

## 2011-08-12 NOTE — Assessment & Plan Note (Signed)
Patient has low back pain, right leg weakness that caused her to stumble, and pain that is causing her to awaken at night. She also has a history of osteoporosis. I am concerned about possible disc herniation, sacral insufficiency fracture, and feel she needs MRI of L-S spine and sacrum SOON to evaluate this. I am giving her some vicodin in the meantime so she can get some relief and rest. She may need referral to neurosurgery or orthopedics depending on the findings. I will see her back in one week for follow up.

## 2011-08-12 NOTE — Assessment & Plan Note (Signed)
Blood pressure is up a little today because of pain. Will recheck next week.

## 2011-08-12 NOTE — Assessment & Plan Note (Signed)
Depending on evaluation under back pain may need to treat this as well.

## 2011-08-12 NOTE — Progress Notes (Signed)
  Subjective:    Patient ID: Dawn Lin, female    DOB: 04/04/1941, 70 y.o.   MRN: [id number]  HPI 70 yr old who comes in with few week history of 3 weeks of low back/R leg pain that is causing her to awaken at night, right leg weakness leading her to stumble. She was seen 2 weeks ago and has been taking flexeril 10mg  three times a day as well as ibuprofen 2 200mg  pilll three times a day.    In the 1990's she had lumbar disc surgery. Though she is walking a bit better the pain is much worse and these medications really are not helping. She gets tingling in her left foot when she sits or stands for any amount of time. Laying down is the most comfortable position.  It has been uncomfortable turning over as well in bed.     Review of Systems  Constitutional: Positive for fatigue.  Respiratory: Negative for shortness of breath.   Cardiovascular: Negative for chest pain.  Musculoskeletal: Positive for back pain and arthralgias.  Neurological: Positive for weakness.       Objective:   Physical Exam  Cardiovascular: Normal rate and regular rhythm.   Pulmonary/Chest: Breath sounds normal.  Abdominal: Bowel sounds are normal.  Musculoskeletal: She exhibits no edema.  motor exam of lower extremities +5/5 bilaterally, sensory normal to light touch and pinprick, SLR-negative on left and equivocal on right today, however she is very slow getting up and down because of grabbing pain in right buttock, groin, low back area, tenderness along L-S spine, in right S-I joint area        Assessment & Plan:

## 2011-08-12 NOTE — Patient Instructions (Signed)
I will see you back in 1 week. We are schedluing an MRI of your back and sacrum. I am giving you vicodin for pain. Drink plenty of water.

## 2011-08-15 ENCOUNTER — Other Ambulatory Visit: Payer: Self-pay | Admitting: Internal Medicine

## 2011-08-15 ENCOUNTER — Ambulatory Visit (HOSPITAL_COMMUNITY)
Admission: RE | Admit: 2011-08-15 | Discharge: 2011-08-15 | Disposition: A | Discharge status: Home / Self Care | Payer: Medicare HMO | Source: Ambulatory Visit | Attending: Internal Medicine | Admitting: Internal Medicine

## 2011-08-15 DIAGNOSIS — M545 Low back pain, unspecified: Secondary | ICD-10-CM

## 2011-08-15 DIAGNOSIS — M25559 Pain in unspecified hip: Secondary | ICD-10-CM | POA: Insufficient documentation

## 2011-08-15 DIAGNOSIS — M79609 Pain in unspecified limb: Secondary | ICD-10-CM | POA: Insufficient documentation

## 2011-08-15 DIAGNOSIS — M47817 Spondylosis without myelopathy or radiculopathy, lumbosacral region: Secondary | ICD-10-CM | POA: Insufficient documentation

## 2011-08-15 DIAGNOSIS — M51379 Other intervertebral disc degeneration, lumbosacral region without mention of lumbar back pain or lower extremity pain: Secondary | ICD-10-CM | POA: Insufficient documentation

## 2011-08-15 DIAGNOSIS — M5137 Other intervertebral disc degeneration, lumbosacral region: Secondary | ICD-10-CM | POA: Insufficient documentation

## 2011-08-15 DIAGNOSIS — Z9889 Other specified postprocedural states: Secondary | ICD-10-CM | POA: Insufficient documentation

## 2011-08-15 DIAGNOSIS — M549 Dorsalgia, unspecified: Secondary | ICD-10-CM

## 2011-08-18 ENCOUNTER — Ambulatory Visit
Admission: RE | Admit: 2011-08-18 | Discharge: 2011-08-18 | Disposition: A | Discharge status: Home / Self Care | Payer: No Typology Code available for payment source | Source: Ambulatory Visit | Attending: Internal Medicine | Admitting: Internal Medicine

## 2011-08-18 DIAGNOSIS — M549 Dorsalgia, unspecified: Secondary | ICD-10-CM

## 2011-08-18 MED ORDER — METHYLPREDNISOLONE ACETATE 40 MG/ML INJ SUSP (RADIOLOG
120.0000 mg | Freq: Once | INTRAMUSCULAR | Status: AC
  Administered 2011-08-18: 120 mg via EPIDURAL

## 2011-08-18 MED ORDER — IOHEXOL 180 MG/ML  SOLN
1.0000 mL | Freq: Once | INTRAMUSCULAR | Status: AC | PRN
  Administered 2011-08-18: 1 mL via EPIDURAL

## 2011-08-18 NOTE — Patient Instructions (Signed)

## 2011-08-19 ENCOUNTER — Encounter: Payer: Medicare HMO | Admitting: Internal Medicine

## 2011-09-11 ENCOUNTER — Other Ambulatory Visit: Payer: Self-pay | Admitting: *Deleted

## 2011-09-11 MED ORDER — HYDROCHLOROTHIAZIDE 25 MG PO TABS: 25.0000 mg | ORAL_TABLET | Freq: Every day | ORAL | Status: DC

## 2011-10-31 ENCOUNTER — Ambulatory Visit (INDEPENDENT_AMBULATORY_CARE_PROVIDER_SITE_OTHER): Payer: Medicare Other | Admitting: Internal Medicine

## 2011-10-31 ENCOUNTER — Encounter: Payer: Self-pay | Admitting: Internal Medicine

## 2011-10-31 DIAGNOSIS — B351 Tinea unguium: Secondary | ICD-10-CM

## 2011-10-31 DIAGNOSIS — D568 Other thalassemias: Secondary | ICD-10-CM

## 2011-10-31 DIAGNOSIS — M549 Dorsalgia, unspecified: Secondary | ICD-10-CM

## 2011-10-31 DIAGNOSIS — I10 Essential (primary) hypertension: Secondary | ICD-10-CM

## 2011-10-31 DIAGNOSIS — E785 Hyperlipidemia, unspecified: Secondary | ICD-10-CM

## 2011-10-31 MED ORDER — LISINOPRIL 10 MG PO TABS
10.0000 mg | ORAL_TABLET | Freq: Every day | ORAL | Status: DC
Start: 2011-10-31 — End: 2011-11-28

## 2011-10-31 NOTE — Patient Instructions (Signed)
Please return to clinic in 1 month for a blood pressure check

## 2011-11-01 LAB — CBC WITH DIFFERENTIAL/PLATELET
Basophils Absolute: 0.1 K/uL (ref 0.0–0.1)
Basophils Relative: 2 % — ABNORMAL HIGH (ref 0–1)
Eosinophils Absolute: 0.1 K/uL (ref 0.0–0.7)
Eosinophils Relative: 2 % (ref 0–5)
HCT: 37.1 % (ref 36.0–46.0)
Hemoglobin: 11.9 g/dL — ABNORMAL LOW (ref 12.0–15.0)
Lymphocytes Relative: 31 % (ref 12–46)
Lymphs Abs: 1.9 K/uL (ref 0.7–4.0)
MCH: 20.2 pg — ABNORMAL LOW (ref 26.0–34.0)
MCHC: 32.1 g/dL (ref 30.0–36.0)
MCV: 63.1 fL — ABNORMAL LOW (ref 78.0–100.0)
Monocytes Absolute: 0.6 K/uL (ref 0.1–1.0)
Monocytes Relative: 9 % (ref 3–12)
Neutro Abs: 3.5 K/uL (ref 1.7–7.7)
Neutrophils Relative %: 56 % (ref 43–77)
Platelets: 323 K/uL (ref 150–400)
RBC: 5.88 MIL/uL — ABNORMAL HIGH (ref 3.87–5.11)
RDW: 17.3 % — ABNORMAL HIGH (ref 11.5–15.5)
WBC: 6.2 K/uL (ref 4.0–10.5)

## 2011-11-01 LAB — BASIC METABOLIC PANEL WITH GFR
BUN: 16 mg/dL (ref 6–23)
CO2: 28 mEq/L (ref 19–32)
Calcium: 10.6 mg/dL — ABNORMAL HIGH (ref 8.4–10.5)
Chloride: 101 mEq/L (ref 96–112)
Creat: 0.71 mg/dL (ref 0.50–1.10)
GFR, Est African American: >89 mL/min
GFR, Est Non African American: 87 mL/min
Glucose, Bld: 84 mg/dL (ref 70–99)
Potassium: 4.3 mEq/L (ref 3.5–5.3)
Sodium: 139 mEq/L (ref 135–145)

## 2011-11-03 ENCOUNTER — Encounter: Payer: Self-pay | Admitting: Internal Medicine

## 2011-11-03 NOTE — Assessment & Plan Note (Signed)
CBC today shows Hgb similar to 2 years ago.

## 2011-11-03 NOTE — Assessment & Plan Note (Signed)
Pressure has been too high.  Will add low dose lisinopril.  At follow-up, if she tolerates lisinopril, this likely will need to be changed to higher dose of lisinopril, at which a point combination pill can be prescribed.

## 2011-11-03 NOTE — Assessment & Plan Note (Signed)
Given her lipid profile and lack of a lot of risk factors, no medication at this time.

## 2011-11-03 NOTE — Assessment & Plan Note (Signed)
Suggested physical therapy but patient wants to stick to exercises given by Chiropractor for now.  Consider offering PT if she is having trouble in the future.  Continue current use of ibuprofen.

## 2011-11-03 NOTE — Progress Notes (Signed)
Subjective:   Patient ID: Dawn Lin female   DOB: October 08, 1941 71 y.o.   MRN: [id number]  HPI: Ms.Devanee Lacretia Nicks Lin is a 71 y.o. with HTN and chronic back pain here for follow-up.  She had injection of R L5-S1 joint on 08/18/11 by interventional radiology.  This resolved her sciatica pain within 10 days.  However,  Her chronic lumbar soreness and chronic R buttocks pain remains.  Her right buttocks pain is 3-4/10 when back.  She exacerbates it with certain activities.  She went to a chiropractor Dr. Arneta Cliche in [place].  He gave her a regimen of core strengthening exercises that she says have started to help her a lot.  She takes ibuprofen 400mg  PO once per day or twice per day, never more often.      Past Medical History  Diagnosis Date  . 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 Other osteoporosis   . Other thalassemia    Current Outpatient Prescriptions  Medication Sig Dispense Refill  . cyclobenzaprine (FLEXERIL) 10 MG tablet Take 10 mg by mouth 3 (three) times daily as needed.        . hydrochlorothiazide (HYDRODIURIL) 25 MG tablet Take 1 tablet (25 mg total) by mouth daily.  93 tablet  4  . lisinopril (PRINIVIL,ZESTRIL) 10 MG tablet Take 1 tablet (10 mg total) by mouth daily.  30 tablet  6  . naproxen (NAPROSYN) 250 MG tablet Take 1 tablet (250 mg total) by mouth 2 (two) times daily with a meal.  30 tablet  0   No family history on file. History   Social History  . Marital Status: Married    Spouse Name: N/A    Number of Children: N/A  . Years of Education: N/A   Occupational History  . retired    Social History Main Topics  . Smoking status: Never Smoker   . Smokeless tobacco: None  . Alcohol Use: No  . Drug Use: No  . Sexually Active: No   Other Topics Concern  . None   Social History Narrative   Dawn Lin is married. Her husband has been chronically ill for 6 years (2011). He has metastatic prostate cancer and multiple myeloma. 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 She has a lot of family support.    Review of Systems: Constitutional: Denies fever, chills, diaphoresis, appetite change and fatigue.  Respiratory: Denies SOB, DOE, cough, chest tightness,  and wheezing.   Cardiovascular: Denies chest pain, palpitations and leg swelling.  Gastrointestinal: Denies nausea, vomiting, abdominal pain, diarrhea, constipation, blood in stool and abdominal distention.   Skin: Denies pallor, rash and wound.  Neurological: Denies dizziness, seizures, syncope, weakness, light-headedness, numbness and headaches.     Objective:  Physical Exam: Filed Vitals:   10/31/11 1140  BP: 174/83  Pulse: 67  Temp: 96.5 F (35.8 C)  TempSrc: Oral  Height: 5' (1.524 m)  Weight: 129 lb 3.2 oz (58.605 kg)  SpO2: 100%   Constitutional: Vital signs reviewed.  Patient is a well-developed and well-nourished woman in no acute distress and cooperative with exam. Alert and oriented x3.  Head: Normocephalic and atraumatic Mouth: no erythema or exudates, MMM Eyes: PERRL, EOMI, conjunctivae normal, No scleral icterus.  Neck: No JVD Cardiovascular: RRR, S1 normal, S2 normal, no MRG, pulses symmetric and intact bilaterally Pulmonary/Chest: CTAB, no wheezes, rales, or rhonchi   Musculoskeletal:  Tender to palpation in lower lumbar area.  No R buttocks  tenderness.  Negative straight leg raise bilaterally.    Neurological: A&O x3, Strength is normal and symmetric bilaterally, cranial nerve II-XII are grossly intact, no focal motor deficit, sensory intact to light touch bilaterally.  Skin: Warm, dry and intact. No rash, cyanosis, or clubbing.     Assessment & Plan:

## 2011-11-03 NOTE — Assessment & Plan Note (Signed)
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  Discussed oral therapy and possible side effects with patient.  She decided to continue observation for now.

## 2011-11-05 NOTE — Progress Notes (Signed)
I saw patient and discussed her care with resident Dr. Yaakov Guthrie.  I agree with the plans as outlined in his note.

## 2011-11-28 ENCOUNTER — Encounter: Payer: Self-pay | Admitting: Internal Medicine

## 2011-11-28 ENCOUNTER — Ambulatory Visit (INDEPENDENT_AMBULATORY_CARE_PROVIDER_SITE_OTHER): Payer: Medicare Other | Admitting: Internal Medicine

## 2011-11-28 VITALS — BP 125/71 | HR 61 | Temp 97.5°F | Ht 60.0 in | Wt 128.9 lb

## 2011-11-28 DIAGNOSIS — M818 Other osteoporosis without current pathological fracture: Secondary | ICD-10-CM

## 2011-11-28 DIAGNOSIS — I10 Essential (primary) hypertension: Secondary | ICD-10-CM

## 2011-11-28 MED ORDER — LISINOPRIL 10 MG PO TABS: 10.0000 mg | ORAL_TABLET | Freq: Every day | ORAL | Status: DC

## 2011-11-28 NOTE — Patient Instructions (Signed)
Please schedule a follow up appointment in 1- 2 months whenever your PCP is available. . Please bring your medication bottles with your next appointment. Please take your medicines as prescribed. I will call you with your lab results if anything will be abnormal.

## 2011-11-28 NOTE — Progress Notes (Signed)
  Subjective:    Patient ID: Dawn Lin, female    DOB: [birth year], 71 y.o.   MRN: [id number]  HPI: 71 year old man with past medical history significant for hypertension, osteoporosis comes to the clinic for blood pressure followup.  1) HTN: She was seen about 2-3 weeks ago in the clinic for high blood pressure and she was started on lisinopril in addition to her HCTZ. She is tolerating lisinopril well, without any side effects. Also denies any headaches, weakness, numbness.  2) Osteoporosis: She states that when she was diagnosed with osteoporosis - she took forteo shots for a year. Her last dexa scan about 2 years ago. She was just wondering if she can get another scan. She continues to have hip pain but is trying weight bearing exercises, which is helping her.  She also wanted to discuss her labs from last visit.  Review of Systems  HENT: Negative for nosebleeds, rhinorrhea and sneezing.   Respiratory: Negative for cough, choking and wheezing.   Cardiovascular: Negative for chest pain, palpitations and leg swelling.  Neurological: Negative for numbness and headaches.       Objective:   Physical Exam  Constitutional: She is oriented to person, place, and time. She appears well-developed and well-nourished.  HENT:  Head: Normocephalic and atraumatic.  Mouth/Throat: No oropharyngeal exudate.  Eyes: Conjunctivae and EOM are normal. Pupils are equal, round, and reactive to light.  Neck: Normal range of motion. Neck supple. No JVD present. No tracheal deviation present. No thyromegaly present.  Cardiovascular: Normal rate, normal heart sounds and intact distal pulses.  Exam reveals no friction rub.   Pulmonary/Chest: Effort normal and breath sounds normal. No stridor. No respiratory distress. She has no wheezes. She has no rales. She exhibits no tenderness.  Abdominal: Bowel sounds are normal. She exhibits no distension. There is no tenderness. There is no rebound.    Musculoskeletal: Normal range of motion. She exhibits no edema and no tenderness.  Lymphadenopathy:    She has no cervical adenopathy.  Neurological: She is alert and oriented to person, place, and time. She has normal reflexes. She displays normal reflexes. No cranial nerve deficit.  Skin: Skin is warm.          Assessment & Plan:

## 2011-11-28 NOTE — Assessment & Plan Note (Signed)
Lab Results  Component Value Date   NA 139 10/31/2011   K 4.3 10/31/2011   CL 101 10/31/2011   CO2 28 10/31/2011   BUN 16 10/31/2011   CREATININE 0.71 10/31/2011   CREATININE 0.80 09/03/2010    BP Readings from Last 3 Encounters:  11/28/11 125/71  10/31/11 174/83  08/18/11 180/87    Assessment: Hypertension control:  controlled  Progress toward goals:  at goal Barriers to meeting goals:  no barriers identified  Plan: Hypertension treatment:  continue current medications

## 2011-11-28 NOTE — Assessment & Plan Note (Signed)
She wanted to get a repeat DEXA scan. Her old scan from 2009 was reviewed and it showed a T score of -2 .5 in the left femoral neck region,T score of -1.2 in lumbar spine. If the patient has osteopenia, then there is an indication for a repeat scan in 3-5 years to see the progression.  But if they have osteoporosis , then there is no  indication to repeat a scan. With her T score being borderline at the cutoff between osteopenia and osteoporosis we may consider getting a repeat scan. Patient was offered to get one, as per her request but then she wanted to discuss with her insurance company if they would be paying for the scan. -Advised her to continue using calcium and vitamin D. -Advised her to continue weight bearing exercises.

## 2012-03-12 ENCOUNTER — Encounter: Payer: Medicare Other | Admitting: Internal Medicine

## 2012-04-16 ENCOUNTER — Other Ambulatory Visit (HOSPITAL_COMMUNITY): Payer: Self-pay | Admitting: Internal Medicine

## 2012-04-16 DIAGNOSIS — Z139 Encounter for screening, unspecified: Secondary | ICD-10-CM

## 2012-05-26 ENCOUNTER — Other Ambulatory Visit: Payer: Self-pay | Admitting: *Deleted

## 2012-05-28 ENCOUNTER — Ambulatory Visit (HOSPITAL_COMMUNITY)
Admission: RE | Admit: 2012-05-28 | Discharge: 2012-05-28 | Disposition: A | Discharge status: Home / Self Care | Payer: Medicare Other | Source: Ambulatory Visit | Attending: Internal Medicine | Admitting: Internal Medicine

## 2012-05-28 DIAGNOSIS — Z139 Encounter for screening, unspecified: Secondary | ICD-10-CM

## 2012-05-28 DIAGNOSIS — Z1231 Encounter for screening mammogram for malignant neoplasm of breast: Secondary | ICD-10-CM | POA: Insufficient documentation

## 2012-09-14 ENCOUNTER — Other Ambulatory Visit: Payer: Self-pay | Admitting: Internal Medicine

## 2012-09-14 NOTE — Telephone Encounter (Signed)
This is not our patient.  Deny

## 2013-05-23 ENCOUNTER — Other Ambulatory Visit (HOSPITAL_COMMUNITY): Payer: Self-pay | Admitting: Internal Medicine

## 2013-05-23 DIAGNOSIS — Z139 Encounter for screening, unspecified: Secondary | ICD-10-CM

## 2013-05-30 ENCOUNTER — Ambulatory Visit (HOSPITAL_COMMUNITY): Payer: Medicare Other

## 2013-07-26 ENCOUNTER — Ambulatory Visit (HOSPITAL_COMMUNITY)
Admission: RE | Admit: 2013-07-26 | Discharge: 2013-07-26 | Disposition: A | Discharge status: Home / Self Care | Payer: Medicare Other | Source: Ambulatory Visit | Attending: Internal Medicine | Admitting: Internal Medicine

## 2013-07-26 DIAGNOSIS — Z139 Encounter for screening, unspecified: Secondary | ICD-10-CM

## 2013-07-26 DIAGNOSIS — Z1231 Encounter for screening mammogram for malignant neoplasm of breast: Secondary | ICD-10-CM | POA: Insufficient documentation

## 2014-07-10 ENCOUNTER — Other Ambulatory Visit (HOSPITAL_COMMUNITY): Payer: Self-pay | Admitting: Internal Medicine

## 2014-07-10 DIAGNOSIS — Z139 Encounter for screening, unspecified: Secondary | ICD-10-CM

## 2014-07-28 ENCOUNTER — Ambulatory Visit (HOSPITAL_COMMUNITY)
Admission: RE | Admit: 2014-07-28 | Discharge: 2014-07-28 | Disposition: A | Discharge status: Home / Self Care | Payer: Medicare HMO | Source: Ambulatory Visit | Attending: Internal Medicine | Admitting: Internal Medicine

## 2014-07-28 DIAGNOSIS — Z1231 Encounter for screening mammogram for malignant neoplasm of breast: Secondary | ICD-10-CM | POA: Diagnosis present

## 2014-07-28 DIAGNOSIS — Z139 Encounter for screening, unspecified: Secondary | ICD-10-CM

## 2015-01-01 ENCOUNTER — Encounter (HOSPITAL_COMMUNITY): Payer: Self-pay | Admitting: *Deleted

## 2015-01-01 ENCOUNTER — Emergency Department (HOSPITAL_COMMUNITY): Payer: Medicare HMO

## 2015-01-01 ENCOUNTER — Emergency Department (HOSPITAL_COMMUNITY)
Admission: EM | Admit: 2015-01-01 | Discharge: 2015-01-01 | Disposition: A | Discharge status: Home / Self Care | Payer: Medicare HMO | Attending: Emergency Medicine | Admitting: Emergency Medicine

## 2015-01-01 DIAGNOSIS — R5383 Other fatigue: Secondary | ICD-10-CM | POA: Insufficient documentation

## 2015-01-01 DIAGNOSIS — I10 Essential (primary) hypertension: Secondary | ICD-10-CM | POA: Diagnosis not present

## 2015-01-01 DIAGNOSIS — M818 Other osteoporosis without current pathological fracture: Secondary | ICD-10-CM | POA: Diagnosis not present

## 2015-01-01 DIAGNOSIS — Z862 Personal history of diseases of the blood and blood-forming organs and certain disorders involving the immune mechanism: Secondary | ICD-10-CM | POA: Diagnosis not present

## 2015-01-01 DIAGNOSIS — Z79899 Other long term (current) drug therapy: Secondary | ICD-10-CM | POA: Diagnosis not present

## 2015-01-01 DIAGNOSIS — R51 Headache: Secondary | ICD-10-CM | POA: Insufficient documentation

## 2015-01-01 DIAGNOSIS — R531 Weakness: Secondary | ICD-10-CM | POA: Insufficient documentation

## 2015-01-01 DIAGNOSIS — R509 Fever, unspecified: Secondary | ICD-10-CM | POA: Diagnosis present

## 2015-01-01 DIAGNOSIS — J40 Bronchitis, not specified as acute or chronic: Secondary | ICD-10-CM

## 2015-01-01 DIAGNOSIS — Z7982 Long term (current) use of aspirin: Secondary | ICD-10-CM | POA: Insufficient documentation

## 2015-01-01 DIAGNOSIS — H9209 Otalgia, unspecified ear: Secondary | ICD-10-CM | POA: Insufficient documentation

## 2015-01-01 DIAGNOSIS — J209 Acute bronchitis, unspecified: Secondary | ICD-10-CM | POA: Insufficient documentation

## 2015-01-01 LAB — RAPID STREP SCREEN (MED CTR MEBANE ONLY): Streptococcus, Group A Screen (Direct): NEGATIVE

## 2015-01-01 MED ORDER — ALBUTEROL SULFATE HFA 108 (90 BASE) MCG/ACT IN AERS
2.0000 | INHALATION_SPRAY | Freq: Once | RESPIRATORY_TRACT | Status: AC
  Administered 2015-01-01: 2 via RESPIRATORY_TRACT
  Filled 2015-01-01: qty 6.7

## 2015-01-01 MED ORDER — AZITHROMYCIN 250 MG PO TABS
500.0000 mg | ORAL_TABLET | Freq: Once | ORAL | Status: AC
  Administered 2015-01-01: 500 mg via ORAL
  Filled 2015-01-01: qty 2

## 2015-01-01 MED ORDER — HYDROCOD POLST-CHLORPHEN POLST 10-8 MG/5ML PO LQCR
5.0000 mL | Freq: Once | ORAL | Status: AC
  Administered 2015-01-01: 5 mL via ORAL
  Filled 2015-01-01: qty 5

## 2015-01-01 MED ORDER — AZITHROMYCIN 250 MG PO TABS: ORAL_TABLET | ORAL | Status: DC

## 2015-01-01 MED ORDER — HYDROCOD POLST-CHLORPHEN POLST 10-8 MG/5ML PO LQCR: 5.0000 mL | Freq: Two times a day (BID) | ORAL | Status: DC | PRN

## 2015-01-01 NOTE — Discharge Instructions (Signed)
Chest x-ray shows no pneumonia.  Increase fluids. Alternate Tylenol and ibuprofen every 3 hours. Prescription for antibiotic and cough syrup.  Use inhaler every 3-4 hours.

## 2015-01-01 NOTE — ED Provider Notes (Signed)
CSN: [id number]     Arrival date & time 01/01/15  2009 History   First MD Initiated Contact with Patient 01/01/15 2211    This chart was scribed for Nat Christen, MD by Terressa Koyanagi, ED Scribe. This patient was seen in room APA11/APA11 and the patient's care was started at 10:25 PM.  Chief Complaint  Patient presents with  . URI   The history is provided by the patient. No language interpreter was used.   PCP: [name]., MD HPI Comments: Dawn Lin is a 74 y.o. female, with PMH noted below, who presents to the Emergency Department complaining of URI including fever, chills, generalized body aches, sore throat, mild ear pain, facial congestion, weakness, fatigue, HA and cough. Pt denies receiving a flu shot this year. Pt reports eating and drinking today. Pt confirms she is able to ambulate. Pt denies abd pain. No stiff neck  Past Medical History  Diagnosis Date  . 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 Other osteoporosis   . Other thalassemia    Past Surgical History  Procedure Laterality Date  . Partial hysterectomy  1970's    for bleeding and prolapse  . Lumbar disc surgery  1990's    for severe pain and sciatica   History reviewed. No pertinent family history. History  Substance Use Topics  . Smoking status: Never Smoker   . Smokeless tobacco: Not on file  . Alcohol Use: No   OB History    No data available     Review of Systems  Constitutional: Positive for fever, chills and fatigue.  HENT: Positive for congestion, ear pain and sore throat.   Respiratory: Positive for cough.   Gastrointestinal: Negative for abdominal pain.  Musculoskeletal:       Generalized body aches  Neurological: Positive for weakness and headaches.  Psychiatric/Behavioral: Negative for confusion.    Allergies  Review of patient's allergies indicates no known allergies.  Home Medications   Prior to Admission medications   Medication Sig Start Date End Date Taking?  Authorizing Provider  amLODipine (NORVASC) 5 MG tablet Take 5 mg by mouth daily. 10/26/14  Yes Historical Provider, MD  aspirin EC 81 MG tablet Take 81 mg by mouth daily.   Yes Historical Provider, MD  Calcium-Phosphorus-Vitamin D [phone] MG-MG-UNIT CHEW Chew by mouth 2 (two) times daily.   Yes Historical Provider, MD  hydrochlorothiazide (HYDRODIURIL) 25 MG tablet Take 1 tablet (25 mg total) by mouth daily. 09/11/11  Yes Burman Freestone, MD  loratadine (CLARITIN) 10 MG tablet Take 10 mg by mouth daily.   Yes Historical Provider, MD  Multiple Vitamin (MULTIVITAMIN WITH MINERALS) TABS tablet Take 1 tablet by mouth daily.   Yes Historical Provider, MD  azithromycin (ZITHROMAX) 250 MG tablet 1 tablet daily starting Tuesday evening for 4 days 01/01/15   Nat Christen, MD  chlorpheniramine-HYDROcodone [facility]) 10-8 MG/5ML [facility] Take 5 mLs by mouth every 12 (twelve) hours as needed for cough. 01/01/15   Nat Christen, MD  lisinopril (PRINIVIL,ZESTRIL) 10 MG tablet Take 1 tablet (10 mg total) by mouth daily. 11/28/11 11/27/12  Pedro Earls, MD   Triage Vitals: BP 164/67 mmHg  Pulse 103  Temp(Src) 99.2 F (37.3 C) (Oral)  Resp 20  Ht 5' (1.524 m)  Wt 137 lb (62.143 kg)  BMI 26.76 kg/m2  SpO2 97% Physical Exam  Constitutional: She is oriented to person, place, and time. She appears well-developed and well-nourished.  coughing  HENT:  Head: Normocephalic  and atraumatic.  Eyes: Conjunctivae and EOM are normal. Pupils are equal, round, and reactive to light.  Neck: Normal range of motion. Neck supple.  Cardiovascular: Normal rate and regular rhythm.   Pulmonary/Chest: Effort normal and breath sounds normal.  Abdominal: Soft. Bowel sounds are normal.  Musculoskeletal: Normal range of motion.  Neurological: She is alert and oriented to person, place, and time.  Skin: Skin is warm and dry.  Psychiatric: She has a normal mood and affect. Her behavior is normal.  Nursing note and vitals  reviewed.   ED Course  Procedures (including critical care time) DIAGNOSTIC STUDIES: Oxygen Saturation is 97% on RA, nl by my interpretation.    COORDINATION OF CARE: 10:29 PM-Discussed treatment plan which includes antibiotics, fluids, alternating between ibuprofen and tylenol, inhaler, and cough syrup with pt at bedside and pt agreed to plan.   Labs Review Labs Reviewed  RAPID STREP SCREEN  CULTURE, GROUP A STREP    Imaging Review Dg Chest 2 View  01/01/2015   CLINICAL DATA:  Fever cough and generalized body aches  EXAM: CHEST  2 VIEW  COMPARISON:  09/11/2004  FINDINGS: There is mild unchanged right hemidiaphragm elevation. Lungs are clear. Hilar and mediastinal contours are normal. Heart size is normal and unchanged. There are no effusions.  IMPRESSION: No active cardiopulmonary disease.   Electronically Signed   By: Andreas Newport M.D.   On: 01/01/2015 21:36     EKG Interpretation None      MDM   Final diagnoses:  Bronchitis    History and physical consistent with viral illness. Chest x-ray negative. Patient is elderly and slightly immunocompromised. Will start antibiotic to prevent bacterial superinfection.  Also prescriptions for Tussionex and albuterol inhaler  I personally performed the services described in this documentation, which was scribed in my presence. The recorded information has been reviewed and is accurate.    Nat Christen, MD 01/01/15 [phone]

## 2015-01-01 NOTE — ED Notes (Signed)
Pt alert & oriented x4, stable gait. Patient given discharge instructions, paperwork & prescription(s). Patient informed not to drive, operate any equipment & handel any important documents 4 hours after taking pain medication. Patient  instructed to stop at the registration desk to finish any additional paperwork. Patient  verbalized understanding. Pt left department in wheelchair w/ no further questions.

## 2015-01-01 NOTE — ED Notes (Signed)
Pt reporting fever, cough and generalized body aches today.  Tylenol taken today, last dose aprox 6pm. Pt does have appointment with PCP tomorrow.

## 2015-01-04 LAB — CULTURE, GROUP A STREP: Strep A Culture: NEGATIVE

## 2015-12-18 DIAGNOSIS — M26602 Left temporomandibular joint disorder, unspecified: Secondary | ICD-10-CM | POA: Diagnosis not present

## 2015-12-18 DIAGNOSIS — Z299 Encounter for prophylactic measures, unspecified: Secondary | ICD-10-CM | POA: Diagnosis not present

## 2015-12-25 DIAGNOSIS — H04123 Dry eye syndrome of bilateral lacrimal glands: Secondary | ICD-10-CM | POA: Diagnosis not present

## 2015-12-25 DIAGNOSIS — H2513 Age-related nuclear cataract, bilateral: Secondary | ICD-10-CM | POA: Diagnosis not present

## 2015-12-25 DIAGNOSIS — H11153 Pinguecula, bilateral: Secondary | ICD-10-CM | POA: Diagnosis not present

## 2015-12-26 DIAGNOSIS — H6980 Other specified disorders of Eustachian tube, unspecified ear: Secondary | ICD-10-CM | POA: Diagnosis not present

## 2015-12-26 DIAGNOSIS — Z299 Encounter for prophylactic measures, unspecified: Secondary | ICD-10-CM | POA: Diagnosis not present

## 2015-12-27 ENCOUNTER — Other Ambulatory Visit (HOSPITAL_COMMUNITY): Payer: Self-pay | Admitting: Internal Medicine

## 2015-12-27 DIAGNOSIS — Z1231 Encounter for screening mammogram for malignant neoplasm of breast: Secondary | ICD-10-CM

## 2016-01-02 DIAGNOSIS — M159 Polyosteoarthritis, unspecified: Secondary | ICD-10-CM | POA: Diagnosis not present

## 2016-01-02 DIAGNOSIS — I10 Essential (primary) hypertension: Secondary | ICD-10-CM | POA: Diagnosis not present

## 2016-01-03 ENCOUNTER — Ambulatory Visit (HOSPITAL_COMMUNITY)
Admission: RE | Admit: 2016-01-03 | Discharge: 2016-01-03 | Disposition: A | Discharge status: Home / Self Care | Payer: Medicare HMO | Source: Ambulatory Visit | Attending: Internal Medicine | Admitting: Internal Medicine

## 2016-01-03 DIAGNOSIS — Z1231 Encounter for screening mammogram for malignant neoplasm of breast: Secondary | ICD-10-CM

## 2016-01-10 DIAGNOSIS — H25812 Combined forms of age-related cataract, left eye: Secondary | ICD-10-CM | POA: Diagnosis not present

## 2016-01-10 DIAGNOSIS — H2511 Age-related nuclear cataract, right eye: Secondary | ICD-10-CM | POA: Diagnosis not present

## 2016-01-10 DIAGNOSIS — H10413 Chronic giant papillary conjunctivitis, bilateral: Secondary | ICD-10-CM | POA: Diagnosis not present

## 2016-01-23 DIAGNOSIS — M26622 Arthralgia of left temporomandibular joint: Secondary | ICD-10-CM | POA: Diagnosis not present

## 2016-01-23 DIAGNOSIS — M26621 Arthralgia of right temporomandibular joint: Secondary | ICD-10-CM | POA: Insufficient documentation

## 2016-01-23 DIAGNOSIS — J302 Other seasonal allergic rhinitis: Secondary | ICD-10-CM | POA: Diagnosis not present

## 2016-01-23 DIAGNOSIS — H9202 Otalgia, left ear: Secondary | ICD-10-CM | POA: Diagnosis not present

## 2016-01-30 DIAGNOSIS — M1990 Unspecified osteoarthritis, unspecified site: Secondary | ICD-10-CM | POA: Diagnosis not present

## 2016-01-30 DIAGNOSIS — I10 Essential (primary) hypertension: Secondary | ICD-10-CM | POA: Diagnosis not present

## 2016-01-30 DIAGNOSIS — M159 Polyosteoarthritis, unspecified: Secondary | ICD-10-CM | POA: Diagnosis not present

## 2016-03-07 DIAGNOSIS — I10 Essential (primary) hypertension: Secondary | ICD-10-CM | POA: Diagnosis not present

## 2016-03-07 DIAGNOSIS — M159 Polyosteoarthritis, unspecified: Secondary | ICD-10-CM | POA: Diagnosis not present

## 2016-03-19 DIAGNOSIS — H2512 Age-related nuclear cataract, left eye: Secondary | ICD-10-CM | POA: Diagnosis not present

## 2016-03-19 DIAGNOSIS — H269 Unspecified cataract: Secondary | ICD-10-CM | POA: Diagnosis not present

## 2016-04-11 DIAGNOSIS — M159 Polyosteoarthritis, unspecified: Secondary | ICD-10-CM | POA: Diagnosis not present

## 2016-04-11 DIAGNOSIS — I10 Essential (primary) hypertension: Secondary | ICD-10-CM | POA: Diagnosis not present

## 2016-04-18 DIAGNOSIS — M159 Polyosteoarthritis, unspecified: Secondary | ICD-10-CM | POA: Diagnosis not present

## 2016-04-18 DIAGNOSIS — I10 Essential (primary) hypertension: Secondary | ICD-10-CM | POA: Diagnosis not present

## 2016-05-05 DIAGNOSIS — H2511 Age-related nuclear cataract, right eye: Secondary | ICD-10-CM | POA: Diagnosis not present

## 2016-05-07 DIAGNOSIS — H25811 Combined forms of age-related cataract, right eye: Secondary | ICD-10-CM | POA: Diagnosis not present

## 2016-05-07 DIAGNOSIS — H2589 Other age-related cataract: Secondary | ICD-10-CM | POA: Diagnosis not present

## 2016-05-07 DIAGNOSIS — H2511 Age-related nuclear cataract, right eye: Secondary | ICD-10-CM | POA: Diagnosis not present

## 2016-05-08 DIAGNOSIS — Z9889 Other specified postprocedural states: Secondary | ICD-10-CM | POA: Diagnosis not present

## 2016-05-08 DIAGNOSIS — H579 Unspecified disorder of eye and adnexa: Secondary | ICD-10-CM | POA: Diagnosis not present

## 2016-05-08 DIAGNOSIS — H2181 Floppy iris syndrome: Secondary | ICD-10-CM | POA: Diagnosis not present

## 2016-05-08 DIAGNOSIS — H59091 Other disorders of the right eye following cataract surgery: Secondary | ICD-10-CM | POA: Diagnosis not present

## 2016-06-05 DIAGNOSIS — Z79899 Other long term (current) drug therapy: Secondary | ICD-10-CM | POA: Diagnosis not present

## 2016-06-05 DIAGNOSIS — E559 Vitamin D deficiency, unspecified: Secondary | ICD-10-CM | POA: Diagnosis not present

## 2016-06-05 DIAGNOSIS — E7800 Pure hypercholesterolemia, unspecified: Secondary | ICD-10-CM | POA: Diagnosis not present

## 2016-06-05 DIAGNOSIS — Z0000 Encounter for general adult medical examination without abnormal findings: Secondary | ICD-10-CM | POA: Diagnosis not present

## 2016-06-05 DIAGNOSIS — Z299 Encounter for prophylactic measures, unspecified: Secondary | ICD-10-CM | POA: Diagnosis not present

## 2016-06-05 DIAGNOSIS — Z1389 Encounter for screening for other disorder: Secondary | ICD-10-CM | POA: Diagnosis not present

## 2016-06-05 DIAGNOSIS — Z1211 Encounter for screening for malignant neoplasm of colon: Secondary | ICD-10-CM | POA: Diagnosis not present

## 2016-06-05 DIAGNOSIS — R5383 Other fatigue: Secondary | ICD-10-CM | POA: Diagnosis not present

## 2016-06-05 DIAGNOSIS — Z7189 Other specified counseling: Secondary | ICD-10-CM | POA: Diagnosis not present

## 2016-07-30 DIAGNOSIS — M159 Polyosteoarthritis, unspecified: Secondary | ICD-10-CM | POA: Diagnosis not present

## 2016-07-30 DIAGNOSIS — I10 Essential (primary) hypertension: Secondary | ICD-10-CM | POA: Diagnosis not present

## 2016-08-19 DIAGNOSIS — H5213 Myopia, bilateral: Secondary | ICD-10-CM | POA: Diagnosis not present

## 2016-08-19 DIAGNOSIS — Z961 Presence of intraocular lens: Secondary | ICD-10-CM | POA: Diagnosis not present

## 2016-08-25 DIAGNOSIS — R69 Illness, unspecified: Secondary | ICD-10-CM | POA: Diagnosis not present

## 2016-09-03 DIAGNOSIS — M26629 Arthralgia of temporomandibular joint, unspecified side: Secondary | ICD-10-CM | POA: Diagnosis not present

## 2016-09-03 DIAGNOSIS — E7800 Pure hypercholesterolemia, unspecified: Secondary | ICD-10-CM | POA: Diagnosis not present

## 2016-09-03 DIAGNOSIS — M549 Dorsalgia, unspecified: Secondary | ICD-10-CM | POA: Diagnosis not present

## 2016-09-03 DIAGNOSIS — Z299 Encounter for prophylactic measures, unspecified: Secondary | ICD-10-CM | POA: Diagnosis not present

## 2016-09-03 DIAGNOSIS — R51 Headache: Secondary | ICD-10-CM | POA: Diagnosis not present

## 2016-09-10 DIAGNOSIS — E7800 Pure hypercholesterolemia, unspecified: Secondary | ICD-10-CM | POA: Diagnosis not present

## 2016-10-23 DIAGNOSIS — Z6827 Body mass index (BMI) 27.0-27.9, adult: Secondary | ICD-10-CM | POA: Diagnosis not present

## 2016-10-23 DIAGNOSIS — N952 Postmenopausal atrophic vaginitis: Secondary | ICD-10-CM | POA: Diagnosis not present

## 2016-10-23 DIAGNOSIS — E2839 Other primary ovarian failure: Secondary | ICD-10-CM | POA: Diagnosis not present

## 2016-10-23 DIAGNOSIS — Z299 Encounter for prophylactic measures, unspecified: Secondary | ICD-10-CM | POA: Diagnosis not present

## 2016-10-23 DIAGNOSIS — Z713 Dietary counseling and surveillance: Secondary | ICD-10-CM | POA: Diagnosis not present

## 2016-11-05 DIAGNOSIS — M159 Polyosteoarthritis, unspecified: Secondary | ICD-10-CM | POA: Diagnosis not present

## 2016-11-05 DIAGNOSIS — I10 Essential (primary) hypertension: Secondary | ICD-10-CM | POA: Diagnosis not present

## 2016-11-25 DIAGNOSIS — D649 Anemia, unspecified: Secondary | ICD-10-CM | POA: Diagnosis not present

## 2016-11-25 DIAGNOSIS — R2681 Unsteadiness on feet: Secondary | ICD-10-CM | POA: Diagnosis not present

## 2016-11-25 DIAGNOSIS — Z299 Encounter for prophylactic measures, unspecified: Secondary | ICD-10-CM | POA: Diagnosis not present

## 2016-11-25 DIAGNOSIS — Z713 Dietary counseling and surveillance: Secondary | ICD-10-CM | POA: Diagnosis not present

## 2016-11-25 DIAGNOSIS — R69 Illness, unspecified: Secondary | ICD-10-CM | POA: Diagnosis not present

## 2016-11-25 DIAGNOSIS — R079 Chest pain, unspecified: Secondary | ICD-10-CM | POA: Diagnosis not present

## 2016-11-25 DIAGNOSIS — E663 Overweight: Secondary | ICD-10-CM | POA: Diagnosis not present

## 2016-11-25 DIAGNOSIS — I10 Essential (primary) hypertension: Secondary | ICD-10-CM | POA: Diagnosis not present

## 2016-12-22 DIAGNOSIS — M159 Polyosteoarthritis, unspecified: Secondary | ICD-10-CM | POA: Diagnosis not present

## 2016-12-22 DIAGNOSIS — I10 Essential (primary) hypertension: Secondary | ICD-10-CM | POA: Diagnosis not present

## 2016-12-26 DIAGNOSIS — Z713 Dietary counseling and surveillance: Secondary | ICD-10-CM | POA: Diagnosis not present

## 2016-12-26 DIAGNOSIS — Z299 Encounter for prophylactic measures, unspecified: Secondary | ICD-10-CM | POA: Diagnosis not present

## 2016-12-26 DIAGNOSIS — Z6827 Body mass index (BMI) 27.0-27.9, adult: Secondary | ICD-10-CM | POA: Diagnosis not present

## 2016-12-26 DIAGNOSIS — Z1389 Encounter for screening for other disorder: Secondary | ICD-10-CM | POA: Diagnosis not present

## 2016-12-26 DIAGNOSIS — R69 Illness, unspecified: Secondary | ICD-10-CM | POA: Diagnosis not present

## 2016-12-26 DIAGNOSIS — I10 Essential (primary) hypertension: Secondary | ICD-10-CM | POA: Diagnosis not present

## 2017-01-02 DIAGNOSIS — M25461 Effusion, right knee: Secondary | ICD-10-CM | POA: Diagnosis not present

## 2017-01-02 DIAGNOSIS — Z299 Encounter for prophylactic measures, unspecified: Secondary | ICD-10-CM | POA: Diagnosis not present

## 2017-01-02 DIAGNOSIS — E785 Hyperlipidemia, unspecified: Secondary | ICD-10-CM | POA: Diagnosis not present

## 2017-01-02 DIAGNOSIS — I10 Essential (primary) hypertension: Secondary | ICD-10-CM | POA: Diagnosis not present

## 2017-01-02 DIAGNOSIS — Z6826 Body mass index (BMI) 26.0-26.9, adult: Secondary | ICD-10-CM | POA: Diagnosis not present

## 2017-01-02 DIAGNOSIS — Z713 Dietary counseling and surveillance: Secondary | ICD-10-CM | POA: Diagnosis not present

## 2017-01-05 DIAGNOSIS — E785 Hyperlipidemia, unspecified: Secondary | ICD-10-CM | POA: Diagnosis not present

## 2017-01-12 DIAGNOSIS — M25561 Pain in right knee: Secondary | ICD-10-CM | POA: Diagnosis not present

## 2017-01-12 DIAGNOSIS — S8001XA Contusion of right knee, initial encounter: Secondary | ICD-10-CM | POA: Diagnosis not present

## 2017-01-19 DIAGNOSIS — M79675 Pain in left toe(s): Secondary | ICD-10-CM | POA: Diagnosis not present

## 2017-01-19 DIAGNOSIS — L851 Acquired keratosis [keratoderma] palmaris et plantaris: Secondary | ICD-10-CM | POA: Diagnosis not present

## 2017-01-19 DIAGNOSIS — M2042 Other hammer toe(s) (acquired), left foot: Secondary | ICD-10-CM | POA: Diagnosis not present

## 2017-02-08 DIAGNOSIS — R110 Nausea: Secondary | ICD-10-CM | POA: Diagnosis not present

## 2017-02-08 DIAGNOSIS — M25511 Pain in right shoulder: Secondary | ICD-10-CM | POA: Diagnosis not present

## 2017-02-08 DIAGNOSIS — R42 Dizziness and giddiness: Secondary | ICD-10-CM | POA: Diagnosis not present

## 2017-02-08 DIAGNOSIS — Z79899 Other long term (current) drug therapy: Secondary | ICD-10-CM | POA: Diagnosis not present

## 2017-02-08 DIAGNOSIS — R51 Headache: Secondary | ICD-10-CM | POA: Diagnosis not present

## 2017-02-08 DIAGNOSIS — I10 Essential (primary) hypertension: Secondary | ICD-10-CM | POA: Diagnosis not present

## 2017-02-08 DIAGNOSIS — R531 Weakness: Secondary | ICD-10-CM | POA: Diagnosis not present

## 2017-02-08 DIAGNOSIS — Z7982 Long term (current) use of aspirin: Secondary | ICD-10-CM | POA: Diagnosis not present

## 2017-02-08 DIAGNOSIS — M542 Cervicalgia: Secondary | ICD-10-CM | POA: Diagnosis not present

## 2017-02-08 DIAGNOSIS — M1990 Unspecified osteoarthritis, unspecified site: Secondary | ICD-10-CM | POA: Diagnosis not present

## 2017-02-09 DIAGNOSIS — Z713 Dietary counseling and surveillance: Secondary | ICD-10-CM | POA: Diagnosis not present

## 2017-02-09 DIAGNOSIS — I10 Essential (primary) hypertension: Secondary | ICD-10-CM | POA: Diagnosis not present

## 2017-02-09 DIAGNOSIS — R911 Solitary pulmonary nodule: Secondary | ICD-10-CM | POA: Diagnosis not present

## 2017-02-09 DIAGNOSIS — Z6827 Body mass index (BMI) 27.0-27.9, adult: Secondary | ICD-10-CM | POA: Diagnosis not present

## 2017-02-09 DIAGNOSIS — Z299 Encounter for prophylactic measures, unspecified: Secondary | ICD-10-CM | POA: Diagnosis not present

## 2017-02-16 DIAGNOSIS — I10 Essential (primary) hypertension: Secondary | ICD-10-CM | POA: Diagnosis not present

## 2017-02-16 DIAGNOSIS — M159 Polyosteoarthritis, unspecified: Secondary | ICD-10-CM | POA: Diagnosis not present

## 2017-02-17 DIAGNOSIS — Z961 Presence of intraocular lens: Secondary | ICD-10-CM | POA: Diagnosis not present

## 2017-02-17 DIAGNOSIS — H43393 Other vitreous opacities, bilateral: Secondary | ICD-10-CM | POA: Diagnosis not present

## 2017-02-18 DIAGNOSIS — I2510 Atherosclerotic heart disease of native coronary artery without angina pectoris: Secondary | ICD-10-CM | POA: Diagnosis not present

## 2017-02-18 DIAGNOSIS — I700 Atherosclerosis of aorta: Secondary | ICD-10-CM | POA: Diagnosis not present

## 2017-02-18 DIAGNOSIS — R911 Solitary pulmonary nodule: Secondary | ICD-10-CM | POA: Diagnosis not present

## 2017-02-24 DIAGNOSIS — M810 Age-related osteoporosis without current pathological fracture: Secondary | ICD-10-CM | POA: Diagnosis not present

## 2017-02-24 DIAGNOSIS — R05 Cough: Secondary | ICD-10-CM | POA: Diagnosis not present

## 2017-02-24 DIAGNOSIS — G8929 Other chronic pain: Secondary | ICD-10-CM | POA: Diagnosis not present

## 2017-02-24 DIAGNOSIS — E663 Overweight: Secondary | ICD-10-CM | POA: Diagnosis not present

## 2017-02-24 DIAGNOSIS — R69 Illness, unspecified: Secondary | ICD-10-CM | POA: Diagnosis not present

## 2017-02-24 DIAGNOSIS — Z299 Encounter for prophylactic measures, unspecified: Secondary | ICD-10-CM | POA: Diagnosis not present

## 2017-02-24 DIAGNOSIS — R911 Solitary pulmonary nodule: Secondary | ICD-10-CM | POA: Diagnosis not present

## 2017-02-24 DIAGNOSIS — Z90710 Acquired absence of both cervix and uterus: Secondary | ICD-10-CM | POA: Diagnosis not present

## 2017-02-24 DIAGNOSIS — I10 Essential (primary) hypertension: Secondary | ICD-10-CM | POA: Diagnosis not present

## 2017-03-05 ENCOUNTER — Encounter: Payer: Self-pay | Admitting: Cardiothoracic Surgery

## 2017-03-05 ENCOUNTER — Institutional Professional Consult (permissible substitution) (INDEPENDENT_AMBULATORY_CARE_PROVIDER_SITE_OTHER): Payer: Medicare HMO | Admitting: Cardiothoracic Surgery

## 2017-03-05 ENCOUNTER — Other Ambulatory Visit: Payer: Self-pay | Admitting: *Deleted

## 2017-03-05 VITALS — BP 182/78 | HR 80 | Resp 16 | Ht 59.0 in | Wt 135.0 lb

## 2017-03-05 DIAGNOSIS — D381 Neoplasm of uncertain behavior of trachea, bronchus and lung: Secondary | ICD-10-CM | POA: Diagnosis not present

## 2017-03-05 DIAGNOSIS — R911 Solitary pulmonary nodule: Secondary | ICD-10-CM

## 2017-03-05 NOTE — Patient Instructions (Signed)
Pulmonary Nodule A pulmonary nodule is a small, round growth of tissue in the lung. Pulmonary nodules can range in size from less than 1/5 inch (4 mm) to a little bigger than an inch (25 mm). Most pulmonary nodules are detected when imaging tests of the lung are being performed for a different problem. Pulmonary nodules are usually not cancerous (benign). However, some pulmonary nodules are cancerous (malignant). Follow-up treatment or testing is based on the size of the pulmonary nodule and your risk of getting lung cancer. What are the causes? Benign pulmonary nodules can be caused by various things. Some of the causes include:  Bacterial, fungal, or viral infections. This is usually an old infection that is no longer active, but it can sometimes be a current, active infection.  A benign mass of tissue.  Inflammation from conditions such as rheumatoid arthritis.  Abnormal blood vessels in the lungs. Malignant pulmonary nodules can result from lung cancer or from cancers that spread to the lung from other places in the body. What are the signs or symptoms? Pulmonary nodules usually do not cause symptoms. How is this diagnosed? Most often, pulmonary nodules are found incidentally when an X-ray or CT scan is performed to look for some other problem in the lung area. To help determine whether a pulmonary nodule is benign or malignant, your health care provider will take a medical history and order a variety of tests. Tests done may include:  Blood tests.  A skin test called a tuberculin test. This test is used to determine if you have been exposed to the germ that causes tuberculosis.  Chest X-rays. If possible, a new X-ray may be compared with X-rays you have had in the past.  CT scan. This test shows smaller pulmonary nodules more clearly than an X-ray.  Positron emission tomography (PET) scan. In this test, a safe amount of a radioactive substance is injected into the bloodstream. Then,  the scan takes a picture of the pulmonary nodule. The radioactive substance is eliminated from your body in your urine.  Biopsy. A tiny piece of the pulmonary nodule is removed so it can be checked under a microscope. How is this treated? Pulmonary nodules that are benign normally do not require any treatment because they usually do not cause symptoms or breathing problems. Your health care provider may want to monitor the pulmonary nodule through follow-up CT scans. The frequency of these CT scans will vary based on the size of the nodule and the risk factors for lung cancer. For example, CT scans will need to be done more frequently if the pulmonary nodule is larger and if you have a history of smoking and a family history of cancer. Further testing or biopsies may be done if any follow-up CT scan shows that the size of the pulmonary nodule has increased. Follow these instructions at home:  Only take over-the-counter or prescription medicines as directed by your health care provider.  Keep all follow-up appointments with your health care provider. Contact a health care provider if:  You have trouble breathing when you are active.  You feel sick or unusually tired.  You do not feel like eating.  You lose weight without trying to.  You develop chills or night sweats. Get help right away if:  You cannot catch your breath, or you begin wheezing.  You cannot stop coughing.  You cough up blood.  You become dizzy or feel like you are going to pass out.  You have sudden  chest pain.  You have a fever or persistent symptoms for more than 2-3 days.  You have a fever and your symptoms suddenly get worse. This information is not intended to replace advice given to you by your health care provider. Make sure you discuss any questions you have with your health care provider. Document Released: 07/27/2009 Document Revised: 03/06/2016 Document Reviewed: 03/21/2013 Elsevier Interactive Patient  Education  2017 [place] Lung cancer occurs when abnormal cells in the lung grow out of control and form a mass (tumor). There are several types of lung cancer. The two most common types are:  Non-small cell. In this type of lung cancer, abnormal cells are larger and grow more slowly than those of small cell lung cancer.  Small cell. In this type of lung cancer, abnormal cells are smaller than those of non-small cell lung cancer. Small cell lung cancer gets worse faster than non-small cell lung cancer. What are the causes? The leading cause of lung cancer is smoking tobacco. The second leading cause is radon exposure. What increases the risk?  Smoking tobacco.  Exposure to secondhand tobacco smoke.  Exposure to radon gas.  Exposure to asbestos.  Exposure to arsenic in drinking water.  Air pollution.  Family or personal history of lung cancer.  Lung radiation therapy.  Being older than 64 years. What are the signs or symptoms? In the early stages, symptoms may not be present. As the cancer progresses, symptoms may include:  A lasting cough, possibly with blood.  Fatigue.  Unexplained weight loss.  Shortness of breath.  Wheezing.  Chest pain.  Loss of appetite. Symptoms of advanced lung cancer include:  Hoarseness.  Bone or joint pain.  Weakness.  Nail problems.  Face or arm swelling.  Paralysis of the face.  Drooping eyelids. How is this diagnosed? Lung cancer can be identified with a physical exam and with tests such as:  A chest X-ray.  A CT scan.  Blood tests.  A biopsy. After a diagnosis is made, you will have more tests to determine the stage of the cancer. The stages of non-small cell lung cancer are:  Stage 0, also called carcinoma in situ. At this stage, abnormal cells are found in the inner lining of your lung or lungs.  Stage I. At this stage, abnormal cells have grown into a tumor that is no larger than 5 cm  across. The cancer has entered the deeper lung tissue but has not yet entered the lymph nodes or other parts of the body.  Stage II. At this stage, the tumor is 7 cm across or smaller and has entered nearby lymph nodes. Or, the tumor is 5 cm across or smaller and has invaded surrounding tissue but is not found in nearby lymph nodes. There may be more than one tumor present.  Stage III. At this stage, the tumor may be any size. There may be more than one tumor in the lungs. The cancer cells have spread to the lymph nodes and possibly to other organs.  Stage IV. At this stage, there are tumors in both lungs and the cancer has spread to other areas of the body. The stages of small cell lung cancer are:  Limited. At this stage, the cancer is found only on one side of the chest.  Extensive. At this stage, the cancer is in the lungs and in tissues on the other side of the chest. The cancer has spread to other organs or  is found in the fluid between the layers of your lungs. How is this treated? Depending on the type and stage of your lung cancer, you may be treated with:  Surgery. This is done to remove a tumor.  Radiation therapy. This treatment destroys cancer cells using X-rays or other types of radiation.  Chemotherapy. This treatment uses medicines to destroy cancer cells.  Targeted therapy. This treatment aims to destroy only cancer cells instead of all cells as other therapies do. You may also have a combination of treatments. Follow these instructions at home:  Do not use any tobacco products. This includes cigarettes, chewing tobacco, and electronic cigarettes. If you need help quitting, ask your health care provider.  Take medicines only as directed by your health care provider.  Eat a healthy diet. Work with a dietitian to make sure you are getting the nutrition you need.  Consider joining a support group or seeking counseling to help you cope with the stress of having lung  cancer.  Let your cancer specialist (oncologist) know if you are admitted to the hospital.  Keep all follow-up visits as directed by your health care provider. This is important. Contact a health care provider if:  You lose weight without trying.  You have a persistent cough and wheezing.  You feel short of breath.  You tire easily.  You experience bone or joint pain.  You have difficulty swallowing.  You feel hoarse or notice your voice changing.  Your pain medicine is not helping. Get help right away if:  You cough up blood.  You have new breathing problems.  You develop chest pain.  You develop swelling in:  One or both ankles or legs.  Your face, neck, or arms.  You are confused.  You experience paralysis in your face or a drooping eyelid. This information is not intended to replace advice given to you by your health care provider. Make sure you discuss any questions you have with your health care provider. Document Released: 01/05/2001 Document Revised: 03/06/2016 Document Reviewed: 02/02/2014 Elsevier Interactive Patient Education  2017 [employer].

## 2017-03-05 NOTE — Progress Notes (Signed)
[address]       [place] [address]             [place] Record #[id number] Date of Birth: 04-08-1941  Referring: [name], MD Primary Care: [name], MD  Chief Complaint:    Chief Complaint  Patient presents with  . Lung Lesion    RULobe...CT CHEST 02/18/17 @ [facility]    History of Present Illness:    Dawn Lin 76 y.o. female is seen in the office  today for Right upper lobe lung nodule. The patient is a lifelong nonsmoker who recently presented to the [facility] with feeling weak flashing lights in her eyes the left eye. A CT scan of the head and neck was performed. No intracranial lesions were noted. The patient did have a right upper lobe lung nodule noted on CT of the neck. Subsequent CT of the chest was performed. She is now referred for evaluation. The patient although a lifelong nonsmoker does have a strong family history of COPD. She notes that she does get short of breath with exertion, denies any specific anginal chest pain. She's had no known cardiac history. She does have extensive calcification of the coronary arteries on the CT scan of the chest.  Patient has had no hemoptysis. Denies frequent pulmonary infections  Current Activity/ Functional Status:  Patient is independent with mobility/ambulation, transfers, ADL's, IADL's.   Zubrod Score: At the time of surgery this patient's most appropriate activity status/level should be described as: '[]'     0    Normal activity, no symptoms '[x]'     1    Restricted in physical strenuous activity but ambulatory, able to do out light work '[]'     2    Ambulatory and capable of self care, unable to do work activities, up and about               >50 % of waking hours                              '[]'     3    Only limited self care, in bed greater than 50% of waking hours '[]'     4    Completely disabled, no self care,  confined to bed or chair '[]'     5    Moribund   Past Medical History:  Diagnosis Date  . 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 Other osteoporosis   . Other thalassemia [facility])     Past Surgical History:  Procedure Laterality Date  . LUMBAR DISC SURGERY  1990's   for severe pain and sciatica  . PARTIAL HYSTERECTOMY  1970's   for bleeding and prolapse    Family History: Patient is on aware of her hours medical history, she does have 1 sister with breast cancer, has multiple sisters and aunts who had COPD or expired from COPD or smokers, patient's mother died of congestive heart failure age 67  Social History   Social History  . Marital status: Married    Spouse name: N/A  . Number of children: N/A  . Years of education: N/A   Occupational History  . retired    Social History Main Topics  . Smoking status: Never Smoker  .  Smokeless tobacco: Never Used  . Alcohol use No  . Drug use: No  . Sexual activity: No   Other Topics Concern  . Not on file   Social History Narrative   Dawn Lin is married. Her husband has been chronically ill for 6 years (2011). He has metastatic prostate cancer and multiple myeloma. 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 She has a lot of family support.     History  Smoking Status  . Never Smoker  Smokeless Tobacco  . Never Used    History  Alcohol Use No     No Known Allergies  Current Outpatient Prescriptions  Medication Sig Dispense Refill  . amLODipine (NORVASC) 10 MG tablet Take 10 mg by mouth daily.    Marland Kitchen aspirin EC 81 MG tablet Take 81 mg by mouth daily.    . benzonatate (TESSALON) 200 MG capsule Take 200 mg by mouth 3 (three) times daily as needed for cough.    . Calcium-Phosphorus-Vitamin D [id number] MG-MG-UNIT CHEW Chew by mouth 2 (two) times daily.    Marland Kitchen escitalopram (LEXAPRO) 10 MG tablet Take 10 mg by mouth daily.    Marland Kitchen glucosamine-chondroitin 500-400 MG tablet Take 1 tablet by mouth daily.     . hydrochlorothiazide (HYDRODIURIL) 25 MG tablet Take 1 tablet (25 mg total) by mouth daily. 93 tablet 4  . Krill Oil 350 MG CAPS Take 1 capsule by mouth daily.    . Multiple Vitamin (MULTIVITAMIN WITH MINERALS) TABS tablet Take 1 tablet by mouth daily.     No current facility-administered medications for this visit.       Review of Systems:     Cardiac Review of Systems: Y or N  Chest Pain [ n   ]  Resting SOB [ n  ] Exertional SOB  [  y]  Orthopnea [ n ]   Pedal Edema [ n  ]    Palpitations [ n ] Syncope  [n  ]   Presyncope [ y  ]  General Review of Systems: [Y] = yes [  ]=no Constitional: recent weight change [n  ];  Wt loss over the last 3 months [   ] anorexia [  ]; fatigue [  ]; nausea [  ]; night sweats [  ]; fever [  ]; or chills [  ];          Dental: poor dentition[  ]; Last Dentist visit:   Eye : blurred vision [ n ]; diplopia [  n ]; vision changes [n  ];  Amaurosis fugax[ n ]; Resp: cough [ y ];  wheezing[ n ];  hemoptysis[  ]; shortness of breath[  ]; paroxysmal nocturnal dyspnea[  ]; dyspnea on exertion[  ]; or orthopnea[  ];  GI:  gallstones[  ], vomiting[ n ];  dysphagia[  ]; melena[  ];  hematochezia [  ]; heartburn[  ];   Hx of  Colonoscopy[  ]; GU: kidney stones [  ]; hematuria[  ];   dysuria [  ];  nocturia[  ];  history of     obstruction [  ]; urinary frequency [  ]             Skin: rash, swelling[  ];, hair loss[  ];  peripheral edema[  ];  or itching[  ]; Musculosketetal: myalgias[  ];  joint swelling[  ];  joint erythema[  ];  joint pain[  ];  back pain[  ];  Heme/Lymph: bruising[  ];  bleeding[  ];  anemia[  ];  Neuro: TIA[ n ];  headaches[y  ];  stroke[  n];  vertigo[  ];  seizures[  ];   paresthesias[  ];  difficulty walking[  ];  Psych:depression[  ]; anxiety[  ];  Endocrine: diabetes[n  ];  thyroid dysfunction[ n ];  Immunizations: Flu up to date [ y ]; Pneumococcal up to date Blue.Reese  ];  Other:  Physical Exam: BP (!) 182/78 (BP Location: Right Arm,  Patient Position: Sitting, Cuff Size: Large)   Pulse 80   Resp 16   Ht '4\' 11"'  (1.499 m)   Wt 135 lb (61.2 kg)   SpO2 97% Comment: ON RA  BMI 27.27 kg/m   PHYSICAL EXAMINATION: General appearance: alert, cooperative, appears stated age and no distress Head: Normocephalic, without obvious abnormality, atraumatic Neck: no adenopathy, no carotid bruit, no JVD, supple, symmetrical, trachea midline and thyroid not enlarged, symmetric, no tenderness/mass/nodules Lymph nodes: Cervical, supraclavicular, and axillary nodes normal. Resp: clear to auscultation bilaterally Back: symmetric, no curvature. ROM normal. No CVA tenderness. Cardio: regular rate and rhythm, S1, S2 normal, no murmur, click, rub or gallop GI: soft, non-tender; bowel sounds normal; no masses,  no organomegaly Extremities: extremities normal, atraumatic, no cyanosis or edema and Homans sign is negative, no sign of DVT Neurologic: Grossly normal Patient has no cervical or supraclavicular adenopathy Diagnostic Studies & Laboratory data:     Recent Radiology Findings:   Final Report  CLINICAL DATA: 76 year old female with history of right upper lobe pulmonary nodule. Followup study.  EXAM: CT CHEST WITH CONTRAST  TECHNIQUE: Multidetector CT imaging of the chest was performed during intravenous contrast administration.  CONTRAST: 60 mL of Isovue 370.  COMPARISON: No prior chest CT. Cervical spine CT 02/08/2017.  FINDINGS: Cardiovascular: Heart size is normal. There is no significant pericardial fluid, thickening or pericardial calcification. There is aortic atherosclerosis, as well as atherosclerosis of the great vessels of the mediastinum and the coronary arteries, including calcified atherosclerotic plaque in the left anterior descending coronary artery.  Mediastinum/Nodes: No pathologically enlarged mediastinal or hilar lymph nodes. Esophagus is unremarkable in appearance. No  axillary lymphadenopathy.  Lungs/Pleura: In the periphery of the right upper lobe near the apex there is a 1.8 x 1.4 x 1.2 cm macrolobulated nodule with spiculated margins (axial image 21 of series 4 and coronal image 51 of series 8030), which is highly suspicious for primary pulmonary neoplasm. This lesion makes contact with the overlying pleura superiorly. No other definite suspicious appearing pulmonary nodules or masses. No acute consolidative airspace disease. No pleural effusions. Scattered areas of mild linear scarring are noted throughout the lung bases bilaterally.  Upper Abdomen: Diffuse low attenuation throughout the visualized hepatic parenchyma, suspicious for hepatic steatosis (difficult to say for certain on today's contrast enhanced study). Calcified granuloma in the left lobe of the liver. Aortic atherosclerosis.  Musculoskeletal: There are no aggressive appearing lytic or blastic lesions noted in the visualized portions of the skeleton.  IMPRESSION: 1. 1.8 x 1.4 x 1.2 cm macrolobulated nodule with spiculated margins making contact with the overlying pleura in the right upper lobe, highly suspicious for primary pulmonary neoplasm. No mediastinal or hilar lymphadenopathy is noted at this time. Further evaluation with PET-CT is recommended in the near future for diagnostic and staging purposes. 2. Aortic atherosclerosis, in addition to left anterior descending coronary artery disease. Assessment for potential risk factor 3. Probable hepatic steatosis.  modification, dietary therapy or pharmacologic  therapy may be warranted, if clinically indicated.These results will be called to the ordering clinician or representative by the Radiologist Assistant, and communication documented in the PACS or zVision Dashboard.   Electronically Signed By: Vinnie Langton M.D. On: 02/19/2017 09:05   I have independently reviewed the above radiology studies  and reviewed the  findings with the patient.   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 We've arranged to proceed with pulmonary function studies and a PET scan. Following this we'll see her back and if suitable for surgical resection we'll have her seen by cardiology for preop clearance.   I  spent 40 minutes counseling the patient face to face and 50% or more the  time was spent in counseling and coordination of care. The total time spent in the appointment was 60 minutes.  Grace Isaac MD      [address] [place] [address] Office [phone]   Beeper [phone]  03/05/2017 2:28 PM

## 2017-03-18 ENCOUNTER — Ambulatory Visit (HOSPITAL_COMMUNITY)
Admission: RE | Admit: 2017-03-18 | Discharge: 2017-03-18 | Disposition: A | Discharge status: Home / Self Care | Payer: Medicare HMO | Source: Ambulatory Visit | Attending: Cardiothoracic Surgery | Admitting: Cardiothoracic Surgery

## 2017-03-18 DIAGNOSIS — R911 Solitary pulmonary nodule: Secondary | ICD-10-CM

## 2017-03-18 DIAGNOSIS — I700 Atherosclerosis of aorta: Secondary | ICD-10-CM | POA: Diagnosis not present

## 2017-03-18 DIAGNOSIS — I2510 Atherosclerotic heart disease of native coronary artery without angina pectoris: Secondary | ICD-10-CM | POA: Insufficient documentation

## 2017-03-18 DIAGNOSIS — J984 Other disorders of lung: Secondary | ICD-10-CM | POA: Insufficient documentation

## 2017-03-18 LAB — GLUCOSE, CAPILLARY: Glucose-Capillary: 99 mg/dL (ref 65–99)

## 2017-03-18 LAB — PULMONARY FUNCTION TEST
DL/VA % pred: 100 %
DL/VA: 4.1 ml/min/mmHg/L
DLCO unc % pred: 72 %
DLCO unc: 12.75 ml/min/mmHg
FEF 25-75 Post: 2.73 L/sec
FEF 25-75 Pre: 2 L/sec
FEF2575-%Change-Post: 37 %
FEF2575-%Pred-Post: 200 %
FEF2575-%Pred-Pre: 146 %
FEV1-%Change-Post: 9 %
FEV1-%Pred-Post: 122 %
FEV1-%Pred-Pre: 111 %
FEV1-Post: 2 L
FEV1-Pre: 1.83 L
FEV1FVC-%Change-Post: 2 %
FEV1FVC-%Pred-Pre: 108 %
FEV6-%Change-Post: 7 %
FEV6-%Pred-Post: 114 %
FEV6-%Pred-Pre: 106 %
FEV6-Post: 2.39 L
FEV6-Pre: 2.23 L
FEV6FVC-%Pred-Post: 105 %
FEV6FVC-%Pred-Pre: 105 %
FVC-%Change-Post: 6 %
FVC-%Pred-Post: 108 %
FVC-%Pred-Pre: 101 %
FVC-Post: 2.39 L
FVC-Pre: 2.24 L
Post FEV1/FVC ratio: 84 %
Post FEV6/FVC ratio: 100 %
Pre FEV1/FVC ratio: 82 %
Pre FEV6/FVC Ratio: 100 %
RV % pred: 86 %
RV: 1.77 L
TLC % pred: 98 %
TLC: 4.23 L

## 2017-03-18 MED ORDER — ALBUTEROL SULFATE (2.5 MG/3ML) 0.083% IN NEBU
2.5000 mg | INHALATION_SOLUTION | Freq: Once | RESPIRATORY_TRACT | Status: AC
  Administered 2017-03-18: 2.5 mg via RESPIRATORY_TRACT

## 2017-03-18 MED ORDER — FLUDEOXYGLUCOSE F - 18 (FDG) INJECTION
6.6900 | Freq: Once | INTRAVENOUS | Status: AC | PRN
  Administered 2017-03-18: 6.69 via INTRAVENOUS

## 2017-03-24 ENCOUNTER — Ambulatory Visit (INDEPENDENT_AMBULATORY_CARE_PROVIDER_SITE_OTHER): Payer: Medicare HMO | Admitting: Cardiothoracic Surgery

## 2017-03-24 ENCOUNTER — Encounter: Payer: Self-pay | Admitting: Cardiothoracic Surgery

## 2017-03-24 ENCOUNTER — Other Ambulatory Visit: Payer: Self-pay | Admitting: *Deleted

## 2017-03-24 VITALS — BP 168/77 | HR 68 | Resp 16 | Ht 59.0 in | Wt 135.0 lb

## 2017-03-24 DIAGNOSIS — R911 Solitary pulmonary nodule: Secondary | ICD-10-CM

## 2017-03-24 DIAGNOSIS — D381 Neoplasm of uncertain behavior of trachea, bronchus and lung: Secondary | ICD-10-CM | POA: Diagnosis not present

## 2017-03-24 NOTE — Progress Notes (Signed)
[address]       [place], [id number]             [place] Record #[id number] Date of Birth: Dec 30, 1940  Referring: [name], MD Primary Care: [name], MD  Chief Complaint:    Chief Complaint  Patient presents with  . Follow-up    after PET/PFT 03/18/17    History of Present Illness:    Dawn Lin 76 y.o. female is seen in the office  today for Right upper lobe lung nodule. The patient is a lifelong nonsmoker who recently presented to the [facility] with feeling weak flashing lights in her eyes the left eye. A CT scan of the head and neck was performed. No intracranial lesions were noted. The patient did have a right upper lobe lung nodule noted on CT of the neck. Subsequent CT of the chest was performed. She is now referred for evaluation. The patient although a lifelong nonsmoker does have a strong family history of COPD. She notes that she does get short of breath with exertion, denies any specific anginal chest pain. She's had no known cardiac history. She does have extensive calcification of the coronary arteries on the CT scan of the chest.  Patient has had no hemoptysis. Denies frequent pulmonary infections  Current Activity/ Functional Status:  Patient is independent with mobility/ambulation, transfers, ADL's, IADL's.   Zubrod Score: At the time of surgery this patient's most appropriate activity status/level should be described as: '[]'     0    Normal activity, no symptoms '[x]'     1    Restricted in physical strenuous activity but ambulatory, able to do out light work '[]'     2    Ambulatory and capable of self care, unable to do work activities, up and about               >50 % of waking hours                              '[]'     3    Only limited self care, in bed greater than 50% of waking hours '[]'     4    Completely disabled, no self care, confined to bed or chair '[]'      5    Moribund   Past Medical History:  Diagnosis Date  . 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 Other osteoporosis   . Other thalassemia [facility])     Past Surgical History:  Procedure Laterality Date  . LUMBAR DISC SURGERY  1990's   for severe pain and sciatica  . PARTIAL HYSTERECTOMY  1970's   for bleeding and prolapse    Family History: Patient is on aware of her hours medical history, she does have 1 sister with breast cancer, has multiple sisters and aunts who had COPD or expired from COPD or smokers, patient's mother died of congestive heart failure age 66  Social History   Social History  . Marital status: Married    Spouse name: N/A  . Number of children: N/A  . Years of education: N/A   Occupational History  . retired    Social History Main Topics  . Smoking status: Never Smoker  . Smokeless tobacco:  Never Used  . Alcohol use No  . Drug use: No  . Sexual activity: No   Other Topics Concern  . Not on file   Social History Narrative   Mrs. Dionisio is married. Her husband has been chronically ill for 6 years (2011). He has metastatic prostate cancer and multiple myeloma. 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 She has a lot of family support.     History  Smoking Status  . Never Smoker  Smokeless Tobacco  . Never Used    History  Alcohol Use No     No Known Allergies  Current Outpatient Prescriptions  Medication Sig Dispense Refill  . amLODipine (NORVASC) 10 MG tablet Take 10 mg by mouth daily.    Marland Kitchen aspirin EC 81 MG tablet Take 81 mg by mouth daily.    . Calcium-Phosphorus-Vitamin D [phone] MG-MG-UNIT CHEW Chew by mouth 2 (two) times daily.    Marland Kitchen escitalopram (LEXAPRO) 10 MG tablet Take 10 mg by mouth daily.    Marland Kitchen glucosamine-chondroitin 500-400 MG tablet Take 1 tablet by mouth daily.    . hydrochlorothiazide (HYDRODIURIL) 25 MG tablet Take 1 tablet (25 mg total) by mouth daily. 93 tablet 4  . Krill Oil 350 MG CAPS  Take 1 capsule by mouth daily.    . Multiple Vitamin (MULTIVITAMIN WITH MINERALS) TABS tablet Take 1 tablet by mouth daily.    . benzonatate (TESSALON) 200 MG capsule Take 200 mg by mouth 3 (three) times daily as needed for cough.     No current facility-administered medications for this visit.       Review of Systems:     Cardiac Review of Systems: Y or N  Chest Pain [ n   ]  Resting SOB [ n  ] Exertional SOB  [  y]  Orthopnea [ n ]   Pedal Edema [ n  ]    Palpitations [ n ] Syncope  [n  ]   Presyncope [ y  ]  General Review of Systems: [Y] = yes [  ]=no Constitional: recent weight change [n  ];  Wt loss over the last 3 months [   ] anorexia [  ]; fatigue [  ]; nausea [  ]; night sweats [  ]; fever [  ]; or chills [  ];          Dental: poor dentition[  ]; Last Dentist visit:   Eye : blurred vision [ n ]; diplopia [  n ]; vision changes [n  ];  Amaurosis fugax[ n ]; Resp: cough [ y ];  wheezing[ n ];  hemoptysis[  ]; shortness of breath[  ]; paroxysmal nocturnal dyspnea[  ]; dyspnea on exertion[  ]; or orthopnea[  ];  GI:  gallstones[  ], vomiting[ n ];  dysphagia[  ]; melena[  ];  hematochezia [  ]; heartburn[  ];   Hx of  Colonoscopy[  ]; GU: kidney stones [  ]; hematuria[  ];   dysuria [  ];  nocturia[  ];  history of     obstruction [  ]; urinary frequency [  ]             Skin: rash, swelling[  ];, hair loss[  ];  peripheral edema[  ];  or itching[  ]; Musculosketetal: myalgias[  ];  joint swelling[  ];  joint erythema[  ];  joint pain[  ];  back pain[  ];  Heme/Lymph: bruising[  ];  bleeding[  ];  anemia[  ];  Neuro: TIA[ n ];  headaches[y  ];  stroke[  n];  vertigo[  ];  seizures[  ];   paresthesias[  ];  difficulty walking[  ];  Psych:depression[  ]; anxiety[  ];  Endocrine: diabetes[n  ];  thyroid dysfunction[ n ];  Immunizations: Flu up to date [ y ]; Pneumococcal up to date Blue.Reese  ];  Other:  Physical Exam: BP (!) 168/77 (BP Location: Left Arm, Patient Position: Sitting, Cuff  Size: Normal)   Pulse 68   Resp 16   Ht '4\' 11"'  (1.499 m)   Wt 135 lb (61.2 kg)   SpO2 97% Comment: ON RA  BMI 27.27 kg/m   PHYSICAL EXAMINATION: General appearance: alert, cooperative, appears stated age and no distress Head: normal Neck: no adenopathy, no carotid bruit, no JVD, supple, symmetrical, trachea midline and thyroid not enlarged, symmetric, no tenderness/mass/nodules Lymph nodes: No enlarged cervical or supraventricular axillary lymph nodes are present Resp: clear to auscultation bilaterally Cardio: regular rate and rhythm, S1, S2 normal, no murmur, click, rub or gallop, no rub GI: soft, non-tender; bowel sounds normal; no masses,  no organomegaly Extremities: extremities normal, atraumatic, no cyanosis or edema and Homans sign is negative, no sign of DVT Neurologic: Grossly normal   Diagnostic Studies & Laboratory data:     Recent Radiology Findings:  Nm Pet Image Initial (pi) Skull Base To Thigh  Result Date: 03/18/2017 CLINICAL DATA:  Initial treatment strategy for right upper lobe pulmonary nodule. EXAM: NUCLEAR MEDICINE PET SKULL BASE TO THIGH TECHNIQUE: 6.7 mCi F-18 FDG was injected intravenously. Full-ring PET imaging was performed from the skull base to thigh after the radiotracer. CT data was obtained and used for attenuation correction and anatomic localization. FASTING BLOOD GLUCOSE:  Value: 99 mg/dl COMPARISON:  Chest CT on 02/18/2017 FINDINGS: NECK No hypermetabolic lymph nodes in the neck. CHEST No hypermetabolic mediastinal or hilar nodes. 1.7 cm pulmonary nodule in the superior right upper lobe shows FDG uptake, with SUV max of 3.3. No other suspicious pulmonary nodules seen on CT. No evidence of pleural effusion. Aortic and coronary artery atherosclerosis noted. ABDOMEN/PELVIS No abnormal hypermetabolic activity within the liver, pancreas, adrenal glands, or spleen. No hypermetabolic lymph nodes in the abdomen or pelvis. Prior hysterectomy noted.  Aortic  atherosclerosis. SKELETON No focal hypermetabolic activity to suggest skeletal metastasis. IMPRESSION: 1.7 cm superior right upper lobe pulmonary nodule shows FDG uptake, highly suspicious for primary bronchogenic carcinoma. No evidence of thoracic nodal or distant metastatic disease. Aortic and coronary artery atherosclerosis. Electronically Signed   By: Earle Gell M.D.   On: 03/18/2017 14:42      Final Report  CLINICAL DATA: 76 year old female with history of right upper lobe pulmonary nodule. Followup study.  EXAM: CT CHEST WITH CONTRAST  TECHNIQUE: Multidetector CT imaging of the chest was performed during intravenous contrast administration.  CONTRAST: 60 mL of Isovue 370.  COMPARISON: No prior chest CT. Cervical spine CT 02/08/2017.  FINDINGS: Cardiovascular: Heart size is normal. There is no significant pericardial fluid, thickening or pericardial calcification. There is aortic atherosclerosis, as well as atherosclerosis of the great vessels of the mediastinum and the coronary arteries, including calcified atherosclerotic plaque in the left anterior descending coronary artery.  Mediastinum/Nodes: No pathologically enlarged mediastinal or hilar lymph nodes. Esophagus is unremarkable in appearance. No axillary lymphadenopathy.  Lungs/Pleura: In the periphery of the right upper lobe near the apex there is a 1.8  x 1.4 x 1.2 cm macrolobulated nodule with spiculated margins (axial image 21 of series 4 and coronal image 51 of series 8030), which is highly suspicious for primary pulmonary neoplasm. This lesion makes contact with the overlying pleura superiorly. No other definite suspicious appearing pulmonary nodules or masses. No acute consolidative airspace disease. No pleural effusions. Scattered areas of mild linear scarring are noted throughout the lung bases bilaterally.  Upper Abdomen: Diffuse low attenuation throughout the visualized hepatic parenchyma, suspicious  for hepatic steatosis (difficult to say for certain on today's contrast enhanced study). Calcified granuloma in the left lobe of the liver. Aortic atherosclerosis.  Musculoskeletal: There are no aggressive appearing lytic or blastic lesions noted in the visualized portions of the skeleton.  IMPRESSION: 1. 1.8 x 1.4 x 1.2 cm macrolobulated nodule with spiculated margins making contact with the overlying pleura in the right upper lobe, highly suspicious for primary pulmonary neoplasm. No mediastinal or hilar lymphadenopathy is noted at this time. Further evaluation with PET-CT is recommended in the near future for diagnostic and staging purposes. 2. Aortic atherosclerosis, in addition to left anterior descending coronary artery disease. Assessment for potential risk factor 3. Probable hepatic steatosis.  modification, dietary therapy or pharmacologic therapy may be warranted, if clinically indicated.These results will be called to the ordering clinician or representative by the Radiologist Assistant, and communication documented in the PACS or zVision Dashboard.   Electronically Signed By: Vinnie Langton M.D. On: 02/19/2017 09:05   I have independently reviewed the above radiology studies  and reviewed the findings with the patient.  PFT's  FEV1 1.83 111% DLCO 12.75 72% Interpretation: The FVC, FEV1, FEV1/FVC ratio and FEF25-75% are within normal limits. . While the TLC and RV are with normal limits, the FRC is reduced. Following administration of bronchodilators, there is no significant response. The reduced diffusing capacity indicates a minimal loss of functional alveolar capillary surface. However, the diffusing capacity was not corrected for the patient's hemoglobin. 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 If this is a carcinoma of the lung would be stage I. I discussed the treatment options with the patient and her family. Before proceeding with surgical resection she would like to know pathologic diagnosis. In this regard we will arrange for a CT-guided needle biopsy of the right upper lobe lung lesion and if it is malignant proceed with surgical resection. She will hold her aspirin and Krill oil pending biopsy.  I will see her soon after the needle biopsy is performed  in discuss the next phase of treatment, possibly involving right video-assisted thoracoscopy and lobectomy with node dissection.  #2  calcified coronary arteries with no previous cardiac history #3 hypercholesterolemia Grace Isaac MD      [address] [place] [address] Office [phone]   Beeper [phone]  03/24/2017 3:38 PM

## 2017-04-03 DIAGNOSIS — E785 Hyperlipidemia, unspecified: Secondary | ICD-10-CM | POA: Diagnosis not present

## 2017-04-03 DIAGNOSIS — I10 Essential (primary) hypertension: Secondary | ICD-10-CM | POA: Diagnosis not present

## 2017-04-03 DIAGNOSIS — Z6826 Body mass index (BMI) 26.0-26.9, adult: Secondary | ICD-10-CM | POA: Diagnosis not present

## 2017-04-03 DIAGNOSIS — Z713 Dietary counseling and surveillance: Secondary | ICD-10-CM | POA: Diagnosis not present

## 2017-04-03 DIAGNOSIS — R911 Solitary pulmonary nodule: Secondary | ICD-10-CM | POA: Diagnosis not present

## 2017-04-03 DIAGNOSIS — Z299 Encounter for prophylactic measures, unspecified: Secondary | ICD-10-CM | POA: Diagnosis not present

## 2017-04-03 DIAGNOSIS — M810 Age-related osteoporosis without current pathological fracture: Secondary | ICD-10-CM | POA: Diagnosis not present

## 2017-04-03 DIAGNOSIS — R69 Illness, unspecified: Secondary | ICD-10-CM | POA: Diagnosis not present

## 2017-04-04 ENCOUNTER — Other Ambulatory Visit: Payer: Self-pay | Admitting: Radiology

## 2017-04-06 ENCOUNTER — Other Ambulatory Visit: Payer: Self-pay | Admitting: General Surgery

## 2017-04-06 ENCOUNTER — Other Ambulatory Visit: Payer: Self-pay | Admitting: Student

## 2017-04-07 ENCOUNTER — Encounter (HOSPITAL_COMMUNITY): Payer: Self-pay

## 2017-04-07 ENCOUNTER — Ambulatory Visit (HOSPITAL_COMMUNITY)
Admission: RE | Admit: 2017-04-07 | Discharge: 2017-04-07 | Disposition: A | Discharge status: Home / Self Care | Payer: Medicare HMO | Source: Ambulatory Visit | Attending: Cardiothoracic Surgery | Admitting: Cardiothoracic Surgery

## 2017-04-07 DIAGNOSIS — Z7982 Long term (current) use of aspirin: Secondary | ICD-10-CM | POA: Diagnosis not present

## 2017-04-07 DIAGNOSIS — M818 Other osteoporosis without current pathological fracture: Secondary | ICD-10-CM | POA: Diagnosis not present

## 2017-04-07 DIAGNOSIS — C3410 Malignant neoplasm of upper lobe, unspecified bronchus or lung: Secondary | ICD-10-CM | POA: Insufficient documentation

## 2017-04-07 DIAGNOSIS — C3411 Malignant neoplasm of upper lobe, right bronchus or lung: Secondary | ICD-10-CM | POA: Diagnosis not present

## 2017-04-07 DIAGNOSIS — R911 Solitary pulmonary nodule: Secondary | ICD-10-CM

## 2017-04-07 DIAGNOSIS — I10 Essential (primary) hypertension: Secondary | ICD-10-CM | POA: Diagnosis not present

## 2017-04-07 DIAGNOSIS — J939 Pneumothorax, unspecified: Secondary | ICD-10-CM | POA: Diagnosis not present

## 2017-04-07 DIAGNOSIS — Z90710 Acquired absence of both cervix and uterus: Secondary | ICD-10-CM | POA: Diagnosis not present

## 2017-04-07 DIAGNOSIS — R918 Other nonspecific abnormal finding of lung field: Secondary | ICD-10-CM | POA: Diagnosis present

## 2017-04-07 LAB — CBC
HCT: 33.6 % — ABNORMAL LOW (ref 36.0–46.0)
Hemoglobin: 10.8 g/dL — ABNORMAL LOW (ref 12.0–15.0)
MCH: 20.3 pg — ABNORMAL LOW (ref 26.0–34.0)
MCHC: 32.1 g/dL (ref 30.0–36.0)
MCV: 63.2 fL — ABNORMAL LOW (ref 78.0–100.0)
Platelets: 235 10*3/uL (ref 150–400)
RBC: 5.32 MIL/uL — ABNORMAL HIGH (ref 3.87–5.11)
RDW: 16.6 % — ABNORMAL HIGH (ref 11.5–15.5)
WBC: 5.9 10*3/uL (ref 4.0–10.5)

## 2017-04-07 LAB — PROTIME-INR
INR: 0.98
Prothrombin Time: 13 seconds (ref 11.4–15.2)

## 2017-04-07 LAB — APTT: aPTT: 28 seconds (ref 24–36)

## 2017-04-07 MED ORDER — FENTANYL CITRATE (PF) 100 MCG/2ML IJ SOLN
INTRAMUSCULAR | Status: AC | PRN
  Administered 2017-04-07: 50 ug via INTRAVENOUS

## 2017-04-07 MED ORDER — MIDAZOLAM HCL 2 MG/2ML IJ SOLN
INTRAMUSCULAR | Status: AC
  Filled 2017-04-07: qty 2

## 2017-04-07 MED ORDER — FENTANYL CITRATE (PF) 100 MCG/2ML IJ SOLN
INTRAMUSCULAR | Status: AC
  Filled 2017-04-07: qty 2

## 2017-04-07 MED ORDER — MIDAZOLAM HCL 2 MG/2ML IJ SOLN
INTRAMUSCULAR | Status: AC | PRN
  Administered 2017-04-07 (×2): 0.5 mg via INTRAVENOUS

## 2017-04-07 MED ORDER — SODIUM CHLORIDE 0.9 % IV SOLN: INTRAVENOUS | Status: DC

## 2017-04-07 MED ORDER — LIDOCAINE HCL (PF) 1 % IJ SOLN
INTRAMUSCULAR | Status: AC
  Filled 2017-04-07: qty 30

## 2017-04-07 NOTE — Procedures (Signed)
RUL NODULE   S/P CT BX  SMALL apical ptx EBL 5CC PATH PENDING FULL REPORT IN pacs

## 2017-04-07 NOTE — Sedation Documentation (Signed)
Patient denies pain and is resting comfortably.  

## 2017-04-07 NOTE — Discharge Instructions (Signed)
Needle Biopsy of the Lung, Care After °This sheet gives you information about how to care for yourself after your procedure. Your health care provider may also give you more specific instructions. If you have problems or questions, contact your health care provider. °What can I expect after the procedure? °After the procedure, it is common to have: °· Soreness, pain, and tenderness where a tissue sample was taken (biopsy site). °· A cough. °· A sore throat. ° °Follow these instructions at home: °Biopsy site care °· Follow instructions from your health care provider about when to remove the bandage that was placed on the biopsy site. °· Keep the bandage dry until it has been removed. °· Check your biopsy site every day for signs of infection. Check for: °? More redness, swelling, or pain. °? More fluid or blood. °? Warmth to the touch. °? Pus or a bad smell. °General instructions °· Rest as directed by your health care provider. Ask your health care provider what activities are safe for you. °· Do not take baths, swim, or use a hot tub until your health care provider approves. °· Take over-the-counter and prescription medicines only as told by your health care provider. °· If you have airplane travel scheduled, talk with your health care provider about when it is safe for you to travel by airplane. °· It is up to you to get the results of your procedure. Ask your health care provider, or the department that is doing the procedure, when your results will be ready. °· Keep all follow-up visits as told by your health care provider. This is important. °Contact a health care provider if: °· You have more redness, swelling, or pain around your biopsy site. °· You have more fluid or blood coming from your biopsy site. °· Your biopsy site feels warm to the touch. °· You have pus or a bad smell coming from your biopsy site. °· You have a fever. °· You have pain that does not get better with medicine. °Get help right away  if: °· You have problems breathing. °· You have chest pain. °· You cough up blood. °· You faint. °· You have a fast heart rate. °Summary °· After a needle biopsy of the lung, it is common to have a cough, a sore throat, or soreness, pain, and tenderness where a tissue sample was taken (biopsy site). °· You should check your biopsy area every day for signs of infection, including pus or a bad smell, warmth, more fluid or blood, or more redness, swelling, or pain. °· You should not take baths, swim, or use a hot tub until your health care provider approves. °· It is up to you to get the results of your procedure. Ask your health care provider, or the department that is doing the procedure, when your results will be ready. °This information is not intended to replace advice given to you by your health care provider. Make sure you discuss any questions you have with your health care provider. °Document Released: 07/27/2007 Document Revised: 08/20/2016 Document Reviewed: 08/20/2016 °Elsevier Interactive Patient Education © 2017 [employer] ° °Moderate Conscious Sedation, Adult, Care After °These instructions provide you with information about caring for yourself after your procedure. Your health care provider may also give you more specific instructions. Your treatment has been planned according to current medical practices, but problems sometimes occur. Call your health care provider if you have any problems or questions after your procedure. °What can I expect after the   procedure? °After your procedure, it is common: °· To feel sleepy for several hours. °· To feel clumsy and have poor balance for several hours. °· To have poor judgment for several hours. °· To vomit if you eat too soon. ° °Follow these instructions at home: °For at least 24 hours after the procedure: ° °· Do not: °? Participate in activities where you could fall or become injured. °? Drive. °? Use heavy machinery. °? Drink alcohol. °? Take sleeping  pills or medicines that cause drowsiness. °? Make important decisions or sign legal documents. °? Take care of children on your own. °· Rest. °Eating and drinking °· Follow the diet recommended by your health care provider. °· If you vomit: °? Drink water, juice, or soup when you can drink without vomiting. °? Make sure you have little or no nausea before eating solid foods. °General instructions °· Have a responsible adult stay with you until you are awake and alert. °· Take over-the-counter and prescription medicines only as told by your health care provider. °· If you smoke, do not smoke without supervision. °· Keep all follow-up visits as told by your health care provider. This is important. °Contact a health care provider if: °· You keep feeling nauseous or you keep vomiting. °· You feel light-headed. °· You develop a rash. °· You have a fever. °Get help right away if: °· You have trouble breathing. °This information is not intended to replace advice given to you by your health care provider. Make sure you discuss any questions you have with your health care provider. °Document Released: 07/20/2013 Document Revised: 03/03/2016 Document Reviewed: 01/19/2016 °Elsevier Interactive Patient Education © 2018 [employer] ° °

## 2017-04-07 NOTE — Sedation Documentation (Signed)
Pt. Presents small pneumo, <20%, on final scan. Follow up cxr in 1hour. Stay on 2Lnc. No distress or pain reported.

## 2017-04-07 NOTE — H&P (Signed)
Chief Complaint: Patient was seen in consultation today for right lung mass biopsy at the request of Gerhardt,Edward B  Referring Physician(s): Grace Isaac  Supervising Physician: Daryll Brod  Patient Status: [facility] - Out-pt  History of Present Illness: Dawn Lin is a 76 y.o. female   Non smoker Presented to [facility] with weakness and flashing lights in eyes. Work up showed incidental finding of RUL mass CT confirmed finding.  6/6/PET: IMPRESSION: 1.7 cm superior right upper lobe pulmonary nodule shows FDG uptake, highly suspicious for primary bronchogenic carcinoma. No evidence of thoracic nodal or distant metastatic disease. Aortic and coronary artery atherosclerosis.  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 If this is a carcinoma of the lung would be stage I. I discussed the treatment options with the patient and her family. Before proceeding with surgical resection she would like to know pathologic diagnosis. In this regard we will arrange for a CT-guided needle biopsy of the right upper lobe lung lesion and if it is malignant proceed with surgical resection. She will hold her aspirin and Krill oil pending biopsy. I will see her soon after the needle biopsy is performed in discuss the next phase of treatment, possibly involving right video-assisted thoracoscopy and lobectomy with node dissection.  Scheduled now for Right lung mass biopsy   Past Medical History:  Diagnosis Date  . 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 Other  osteoporosis   . Other thalassemia [facility])     Past Surgical History:  Procedure Laterality Date  . LUMBAR DISC SURGERY  1990's   for severe pain and sciatica  . PARTIAL HYSTERECTOMY  1970's   for bleeding and prolapse    Allergies: Patient has no known allergies.  Medications: Prior to Admission medications   Medication Sig Start Date End Date Taking? Authorizing Provider  amLODipine (NORVASC) 10 MG tablet Take 10 mg by mouth daily.   Yes [provider]  aspirin EC 81 MG tablet Take 81 mg by mouth daily.   Yes [provider]  benzonatate (TESSALON) 200 MG capsule Take 200 mg by mouth 3 (three) times daily as needed for cough.   Yes [provider]  Calcium-Phosphorus-Vitamin D [id number] MG-MG-UNIT CHEW Chew 1 each by mouth 2 (two) times daily.    Yes [provider]  escitalopram (LEXAPRO) 10 MG tablet Take 10 mg by mouth daily.   Yes [provider]  hydrochlorothiazide (HYDRODIURIL) 25 MG tablet Take 1 tablet (25 mg total) by mouth daily. 09/11/11  Yes Burman Freestone, MD  Krill Oil 350 MG CAPS Take 1 capsule by mouth daily.   Yes [provider]  Multiple Vitamin (MULTIVITAMIN WITH MINERALS) TABS tablet Take 1 tablet by mouth daily.   Yes [provider]     History reviewed. No pertinent family history.  Social History   Social History  . Marital status: Married    Spouse name: N/A  . Number of children: N/A  . Years of education: N/A   Occupational History  . retired    Social History Main Topics  .  Smoking status: Never Smoker  . Smokeless tobacco: Never Used  . Alcohol use No  . Drug use: No  . Sexual activity: No   Other Topics Concern  . None   Social History Narrative   Dawn Lin is married. Her husband has been chronically ill for 6 years (2011). He has metastatic prostate cancer and multiple myeloma. 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 She has a lot of family  support.      Review of Systems: A 12 point ROS discussed and pertinent positives are indicated in the HPI above.  All other systems are negative.  Review of Systems  Constitutional: Positive for fatigue. Negative for activity change, appetite change and fever.  Respiratory: Negative for cough and shortness of breath.   Gastrointestinal: Negative for abdominal pain.  Neurological: Positive for weakness.  Psychiatric/Behavioral: Negative for behavioral problems and confusion.    Vital Signs: BP (!) 150/63   Pulse 60   Temp 97.9 F (36.6 C)   Resp 20   Ht '4\' 11"'  (1.499 m)   Wt 131 lb (59.4 kg)   SpO2 100%   BMI 26.46 kg/m   Physical Exam  Constitutional: She is oriented to person, place, and time. She appears well-nourished.  Cardiovascular: Normal rate, regular rhythm and normal heart sounds.   Pulmonary/Chest: Effort normal and breath sounds normal.  Abdominal: Soft. Bowel sounds are normal.  Musculoskeletal: Normal range of motion.  Neurological: She is alert and oriented to person, place, and time.  Skin: Skin is warm and dry.  Psychiatric: She has a normal mood and affect. Her behavior is normal. Judgment and thought content normal.  Nursing note and vitals reviewed.   Mallampati Score:  MD Evaluation Airway: WNL Heart: WNL Abdomen: WNL Chest/ Lungs: WNL Mallampati/Airway Score: Two  Imaging: Nm Pet Image Initial (pi) Skull Base To Thigh  Result Date: 03/18/2017 CLINICAL DATA:  Initial treatment strategy for right upper lobe pulmonary nodule. EXAM: NUCLEAR MEDICINE PET SKULL BASE TO THIGH TECHNIQUE: 6.7 mCi F-18 FDG was injected intravenously. Full-ring PET imaging was performed from the skull base to thigh after the radiotracer. CT data was obtained and used for attenuation correction and anatomic localization. FASTING BLOOD GLUCOSE:  Value: 99 mg/dl COMPARISON:  Chest CT on 02/18/2017 FINDINGS: NECK No hypermetabolic lymph nodes in the neck. CHEST No  hypermetabolic mediastinal or hilar nodes. 1.7 cm pulmonary nodule in the superior right upper lobe shows FDG uptake, with SUV max of 3.3. No other suspicious pulmonary nodules seen on CT. No evidence of pleural effusion. Aortic and coronary artery atherosclerosis noted. ABDOMEN/PELVIS No abnormal hypermetabolic activity within the liver, pancreas, adrenal glands, or spleen. No hypermetabolic lymph nodes in the abdomen or pelvis. Prior hysterectomy noted.  Aortic atherosclerosis. SKELETON No focal hypermetabolic activity to suggest skeletal metastasis. IMPRESSION: 1.7 cm superior right upper lobe pulmonary nodule shows FDG uptake, highly suspicious for primary bronchogenic carcinoma. No evidence of thoracic nodal or distant metastatic disease. Aortic and coronary artery atherosclerosis. Electronically Signed   By: Earle Gell M.D.   On: 03/18/2017 14:42    Labs:  CBC:  Recent Labs  04/07/17 0932  WBC 5.9  HGB 10.8*  HCT 33.6*  PLT 235    COAGS: No results for input(s): INR, APTT in the last 8760 hours.  BMP: No results for input(s): NA, K, CL, CO2, GLUCOSE, BUN, CALCIUM, CREATININE, GFRNONAA, GFRAA in the last 8760 hours.  Invalid input(s): CMP  LIVER FUNCTION TESTS:  No results for input(s): BILITOT, AST, ALT, ALKPHOS, PROT, ALBUMIN in the last 8760 hours.  TUMOR MARKERS: No results for input(s): AFPTM, CEA, CA199, CHROMGRNA in the last 8760 hours.  Assessment and Plan:  Right lung mass +PET Dr Servando Snare requesting biopsy Risks and Benefits discussed with the patient including, but not limited to bleeding, hemoptysis, respiratory failure requiring intubation, infection, pneumothorax requiring chest tube placement, stroke from air embolism or even death. All of the patient's questions were answered, patient is agreeable to proceed. Consent signed and in chart.   Thank you for this interesting consult.  I greatly enjoyed meeting Dawn Lin and look forward to  participating in their care.  A copy of this report was sent to the requesting provider on this date.  Electronically Signed: Lavonia Drafts, PA-C 04/07/2017, 10:18 AM   I spent a total of  30 Minutes   in face to face in clinical consultation, greater than 50% of which was counseling/coordinating care for right lung mass biopsy

## 2017-04-07 NOTE — Sedation Documentation (Signed)
Patient is resting comfortably. 

## 2017-04-09 DIAGNOSIS — M159 Polyosteoarthritis, unspecified: Secondary | ICD-10-CM | POA: Diagnosis not present

## 2017-04-09 DIAGNOSIS — I10 Essential (primary) hypertension: Secondary | ICD-10-CM | POA: Diagnosis not present

## 2017-04-10 ENCOUNTER — Encounter: Payer: Self-pay | Admitting: Cardiothoracic Surgery

## 2017-04-10 ENCOUNTER — Ambulatory Visit (INDEPENDENT_AMBULATORY_CARE_PROVIDER_SITE_OTHER): Payer: Medicare HMO | Admitting: Cardiothoracic Surgery

## 2017-04-10 ENCOUNTER — Other Ambulatory Visit: Payer: Self-pay | Admitting: *Deleted

## 2017-04-10 VITALS — BP 158/76 | HR 82 | Resp 20 | Ht 59.0 in | Wt 130.0 lb

## 2017-04-10 DIAGNOSIS — H538 Other visual disturbances: Secondary | ICD-10-CM

## 2017-04-10 DIAGNOSIS — Z9889 Other specified postprocedural states: Secondary | ICD-10-CM | POA: Diagnosis not present

## 2017-04-10 DIAGNOSIS — C3411 Malignant neoplasm of upper lobe, right bronchus or lung: Secondary | ICD-10-CM

## 2017-04-10 DIAGNOSIS — D381 Neoplasm of uncertain behavior of trachea, bronchus and lung: Secondary | ICD-10-CM | POA: Diagnosis not present

## 2017-04-10 DIAGNOSIS — C7931 Secondary malignant neoplasm of brain: Secondary | ICD-10-CM

## 2017-04-10 NOTE — Patient Instructions (Signed)
Lung Resection A lung resection is a procedure to remove part or all of a lung. When an entire lung is removed, the procedure is called a pneumonectomy. When only part of a lung is removed, the procedure is called a lobectomy. A lung resection is typically done to get rid of a tumor or cancer, but it may be done to treat other conditions. This procedure can help relieve some or all of your symptoms and can also help keep the problem from getting worse. Lung resection may provide the best chance for curing your disease. However, the procedure may not necessarily cure lung cancer if that is the problem. Tell a health care provider about:  Any allergies you have.  All medicines you are taking, including vitamins, herbs, eye drops, creams, and over-the-counter medicines.  Any problems you or family members have had with anesthetic medicines.  Any blood disorders you have.  Any surgeries you have had.  Any medical conditions you have. What are the risks? Generally, lung resection is a safe procedure. However, problems can occur and include:  Excessive bleeding.  Infection.  Inability to breathe without a ventilator.  Persistent shortness of breath.  Heart problems, including abnormal rhythms and a risk of heart attack or heart failure.  Blood clots.  Injury to a blood vessel.  Injury to a nerve.  Failure to heal properly.  Stroke.  Bronchopleural fistula. This is a small hole between one of the main breathing tubes (bronchus) and the lining of the lungs. This is rare.  Reaction to anesthesia.  What happens before the procedure? You may have tests done before the procedure, including:  Blood tests.  Urine tests.  X-rays.  Other imaging tests (such as CT scans, MRI scans, and PET scans). These tests are done to find the exact size and location of the condition being treated with this surgery.  Pulmonary function tests. These are breathing tests to assess the function of  your lungs before surgery and to decide how to best help your breathing after surgery.  Heart testing. This is done to make sure your heart is strong enough for the procedure.  Bronchoscopy. This is a technique that allows your health care provider to look at the inside of your airways. This is done using a soft, flexible tube (bronchoscope). Along with imaging tests, this can help your health care provider know the exact location and size of the area that will be removed during surgery.  Lymph node sampling. This may need to be done to see if the tumor has spread. It may be done as a separate surgery or right before your lung resection procedure.  What happens during the procedure?  An IV tube will be placed in your arm. You will be given a medicine that makes you fall asleep (general anesthetic). You may also get pain medicine through a thin, flexible tube (catheter) in your back.  A breathing tube will be placed in your throat.  Once the surgical team has prepared you for surgery, your surgeon will make an incision on your side. Some resections are done through large incisions, while others can be done through small incisions using smaller instruments and assisted with small cameras (laparoscopic surgery).  Your surgeon will carefully cut the veins, arteries, and bronchus leading to your lung. After being cut, each of these pieces will be sewn or stapled closed. The lung or part of the lung will then be removed.  Your surgeon will check inside your chest to  make sure there is no bleeding in or around the lungs. Lymph nodes near the lung may also be removed for later tests.  Your surgeon may put tubes into your chest to drain extra fluid and air after surgery.  Your incision will be closed. This may be done using: ? Stitches that absorb into your body and do not need to be removed. ? Stitches that must be removed. ? Staples that must be removed. What happens after the procedure?  You  will be taken to the recovery area and your progress will be monitored. You may still have a breathing tube and other tubes or catheters in your body immediately after surgery. These will be removed during your recovery. You may be put on a respirator following surgery if some assistance is needed to help your breathing. When you are awake and not experiencing immediate problems from surgery, you will be moved to the intensive care unit (ICU) where you will continue your recovery.  You may feel pain in your chest and throat. Sometimes during recovery, patients may shiver or feel nauseous. You will be given medicine to help with pain and nausea.  The breathing tube will be taken out as soon as your health care providers feel you can breathe on your own. For most people, this happens on the same day as the surgery.  If your surgery and time in the ICU go well, most of the tubes and equipment will be taken out within 1-2 days after surgery. This is about how long most people stay in the ICU. You may need to stay longer, depending on how you are doing.  You should also start respiratory therapy in the ICU. This therapy uses breathing exercises to help your other lung stay healthy and get stronger.  As you improve, you will be moved to a regular hospital room for continued respiratory therapy, help with your bladder and bowels, and to continue medicines.  After your lung or part of your lung is taken out, there will be a space inside your chest. This space will often fill up with fluid over time. The amount of time this takes is different for each person.  You will receive care until you are doing well and your health care provider feels it is safe for you to go home or to transfer to an extended care facility. This information is not intended to replace advice given to you by your health care provider. Make sure you discuss any questions you have with your health care provider. Document Released:  12/20/2002 Document Revised: 03/06/2016 Document Reviewed: 11/18/2013 Elsevier Interactive Patient Education  2018 [employer] Lung Resection, Care After Refer to this sheet in the next few weeks. These instructions provide you with information on caring for yourself after your procedure. Your health care provider may also give you more specific instructions. Your treatment has been planned according to current medical practices, but problems sometimes occur. Call your health care provider if you have any problems or questions after your procedure. What can I expect after the procedure? After your procedure, it is typical to have the following:  You may feel pain in your chest and throat.  Patients may sometimes shiver or feel nauseous during recovery.  Follow these instructions at home:  You may resume a normal diet and activities as directed by your health care provider.  Do not use any tobacco products, including cigarettes, chewing tobacco, or electronic cigarettes. If you need help quitting, ask your health  care provider.  There are many different ways to close and cover an incision, including stitches, skin glue, and adhesive strips. Follow your health care provider's instructions on: ? Incision care. ? Bandage (dressing) changes and removal. ? Incision closure removal.  Take medicines only as directed by your health care provider.  Keep all follow-up visits as directed by your health care provider. This is important.  Try to breathe deeply and cough as directed. Holding a pillow firmly over your ribs may help with discomfort.  If you were given an incentive spirometer in the hospital, continue to use it as directed by your health care provider.  Walk as directed by your health care provider.  You may take a shower and gently wash the area of your incision with water and soap as directed by your health care provider. Do not use anything else to clean your incision except as  directed by your health care provider. Do not take baths, swim, or use a hot tub until your health care provider approves. Contact a health care provider if:  You notice redness, swelling, or increasing pain at the incision site.  You are bleeding at the incision site.  You see pus coming from the incision site.  You notice a bad smell coming from the incision site or bandage.  Your incision breaks open.  You cough up blood or pus, or you develop a cough that produces bad-smelling sputum.  You have pain or swelling in your legs.  You have increasing pain that is not controlled with medicine.  You have trouble managing any of the tubes that have been left in place after surgery.  You have fever or chills. Get help right away if:  You have chest pain or an irregular or rapid heartbeat.  You have dizzy episodes or faint.  You have shortness of breath or difficulty breathing.  You have persistent nausea or vomiting.  You have a rash. This information is not intended to replace advice given to you by your health care provider. Make sure you discuss any questions you have with your health care provider. Document Released: 04/18/2005 Document Revised: 03/06/2016 Document Reviewed: 11/18/2013 Elsevier Interactive Patient Education  2018 [place] Lung cancer occurs when abnormal cells in the lung grow out of control and form a mass (tumor). There are several types of lung cancer. The two most common types are:  Non-small cell. In this type of lung cancer, abnormal cells are larger and grow more slowly than those of small cell lung cancer.  Small cell. In this type of lung cancer, abnormal cells are smaller than those of non-small cell lung cancer. Small cell lung cancer gets worse faster than non-small cell lung cancer.  What are the causes? The leading cause of lung cancer is smoking tobacco. The second leading cause is radon exposure. What increases the  risk?  Smoking tobacco.  Exposure to secondhand tobacco smoke.  Exposure to radon gas.  Exposure to asbestos.  Exposure to arsenic in drinking water.  Air pollution.  Family or personal history of lung cancer.  Lung radiation therapy.  Being older than 86 years. What are the signs or symptoms? In the early stages, symptoms may not be present. As the cancer progresses, symptoms may include:  A lasting cough, possibly with blood.  Fatigue.  Unexplained weight loss.  Shortness of breath.  Wheezing.  Chest pain.  Loss of appetite.  Symptoms of advanced lung cancer include:  Hoarseness.  Bone or joint pain.  Weakness.  Nail problems.  Face or arm swelling.  Paralysis of the face.  Drooping eyelids.  How is this diagnosed? Lung cancer can be identified with a physical exam and with tests such as:  A chest X-ray.  A CT scan.  Blood tests.  A biopsy.  After a diagnosis is made, you will have more tests to determine the stage of the cancer. The stages of non-small cell lung cancer are:  Stage 0, also called carcinoma in situ. At this stage, abnormal cells are found in the inner lining of your lung or lungs.  Stage I. At this stage, abnormal cells have grown into a tumor that is no larger than 5 cm across. The cancer has entered the deeper lung tissue but has not yet entered the lymph nodes or other parts of the body.  Stage II. At this stage, the tumor is 7 cm across or smaller and has entered nearby lymph nodes. Or, the tumor is 5 cm across or smaller and has invaded surrounding tissue but is not found in nearby lymph nodes. There may be more than one tumor present.  Stage III. At this stage, the tumor may be any size. There may be more than one tumor in the lungs. The cancer cells have spread to the lymph nodes and possibly to other organs.  Stage IV. At this stage, there are tumors in both lungs and the cancer has spread to other areas of the  body.  The stages of small cell lung cancer are:  Limited. At this stage, the cancer is found only on one side of the chest.  Extensive. At this stage, the cancer is in the lungs and in tissues on the other side of the chest. The cancer has spread to other organs or is found in the fluid between the layers of your lungs.  How is this treated? Depending on the type and stage of your lung cancer, you may be treated with:  Surgery. This is done to remove a tumor.  Radiation therapy. This treatment destroys cancer cells using X-rays or other types of radiation.  Chemotherapy. This treatment uses medicines to destroy cancer cells.  Targeted therapy. This treatment aims to destroy only cancer cells instead of all cells as other therapies do.  You may also have a combination of treatments. Follow these instructions at home:  Do not use any tobacco products. This includes cigarettes, chewing tobacco, and electronic cigarettes. If you need help quitting, ask your health care provider.  Take medicines only as directed by your health care provider.  Eat a healthy diet. Work with a dietitian to make sure you are getting the nutrition you need.  Consider joining a support group or seeking counseling to help you cope with the stress of having lung cancer.  Let your cancer specialist (oncologist) know if you are admitted to the hospital.  Keep all follow-up visits as directed by your health care provider. This is important. Contact a health care provider if:  You lose weight without trying.  You have a persistent cough and wheezing.  You feel short of breath.  You tire easily.  You experience bone or joint pain.  You have difficulty swallowing.  You feel hoarse or notice your voice changing.  Your pain medicine is not helping. Get help right away if:  You cough up blood.  You have new breathing problems.  You develop chest pain.  You develop swelling in: ? One  or both  ankles or legs. ? Your face, neck, or arms.  You are confused.  You experience paralysis in your face or a drooping eyelid. This information is not intended to replace advice given to you by your health care provider. Make sure you discuss any questions you have with your health care provider. Document Released: 01/05/2001 Document Revised: 03/06/2016 Document Reviewed: 02/02/2014 Elsevier Interactive Patient Education  2017 [employer].

## 2017-04-10 NOTE — Progress Notes (Signed)
[address]       [place] [address]             [facility]en Record #[id number] Date of Birth: 05-19-1941  Referring: [name], MD Primary Care: [name], MD  Chief Complaint:    Chief Complaint  Patient presents with  . Lung Lesion    f/u after CT Biopsy 04/07/17, review results    History of Present Illness:    Dawn Lin 76 y.o. female is seen in the office  today for Follow-up after needle biopsy of Right upper lobe lung nodule.   The patient is a lifelong nonsmoker who recently presented to the [facility] with feeling weak flashing lights in her eyes the left eye. A CT scan of the head and neck was performed. No intracranial lesions were noted. The patient did have a right upper lobe lung nodule noted on CT of the neck. Subsequent CT of the chest was performed. She is now referred for evaluation. The patient although a lifelong nonsmoker does have a strong family history of COPD. She notes that she does get short of breath with exertion, denies any specific anginal chest pain. She's had no known cardiac history. She does have extensive calcification of the coronary arteries on the CT scan of the chest.  Patient has had no hemoptysis. Denies frequent pulmonary infections  Current Activity/ Functional Status:  Patient is independent with mobility/ambulation, transfers, ADL's, IADL's.   Zubrod Score: At the time of surgery this patient's most appropriate activity status/level should be described as: '[]'     0    Normal activity, no symptoms '[x]'     1    Restricted in physical strenuous activity but ambulatory, able to do out light work '[]'     2    Ambulatory and capable of self care, unable to do work activities, up and about               >50 % of waking hours                              '[]'     3    Only limited self care, in bed greater than 50% of waking hours '[]'     4     Completely disabled, no self care, confined to bed or chair '[]'     5    Moribund   Past Medical History:  Diagnosis Date  . 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 Other osteoporosis   . Other thalassemia [facility])     Past Surgical History:  Procedure Laterality Date  . LUMBAR DISC SURGERY  1990's   for severe pain and sciatica  . PARTIAL HYSTERECTOMY  1970's   for bleeding and prolapse    Family History:Family history is reviewed no further details are added Patient is on aware of her hours medical history, she does have 1 sister with breast cancer, has multiple sisters and aunts who had COPD or expired from COPD or smokers, patient's mother died of congestive heart failure age 28  Social History   Social History  . Marital status: Married    Spouse name: N/A  . Number of children: N/A  . Years of education: N/A   Occupational History  .  retired    Social History Main Topics  . Smoking status: Never Smoker  . Smokeless tobacco: Never Used  . Alcohol use No  . Drug use: No  . Sexual activity: No   Other Topics Concern  . Not on file   Social History Narrative   [name] is married. Her husband has been chronically ill for 6 years (2011). He has metastatic prostate cancer and multiple myeloma. 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 She has a lot of family support.     History  Smoking Status  . Never Smoker  Smokeless Tobacco  . Never Used    History  Alcohol Use No     No Known Allergies  Current Outpatient Prescriptions  Medication Sig Dispense Refill  . amLODipine (NORVASC) 10 MG tablet Take 10 mg by mouth daily.    Marland Kitchen aspirin EC 81 MG tablet Take 81 mg by mouth daily.    . benzonatate (TESSALON) 200 MG capsule Take 200 mg by mouth 3 (three) times daily as needed for cough.    . Calcium-Phosphorus-Vitamin D [id number] MG-MG-UNIT CHEW Chew 1 each by mouth 2 (two) times daily.     Marland Kitchen escitalopram (LEXAPRO) 10 MG tablet Take  10 mg by mouth daily.    . hydrochlorothiazide (HYDRODIURIL) 25 MG tablet Take 1 tablet (25 mg total) by mouth daily. 93 tablet 4  . Krill Oil 350 MG CAPS Take 1 capsule by mouth daily.    . Multiple Vitamin (MULTIVITAMIN WITH MINERALS) TABS tablet Take 1 tablet by mouth daily.     No current facility-administered medications for this visit.       Review of Systems:     Cardiac Review of Systems: Y or N  Chest Pain [ n   ]  Resting SOB [ n  ] Exertional SOB  [  y]  Orthopnea [ n ]   Pedal Edema [ n  ]    Palpitations [ n ] Syncope  [n  ]   Presyncope [ y  ]  General Review of Systems: [Y] = yes [  ]=no Constitional: recent weight change [n  ];  Wt loss over the last 3 months [   ] anorexia [  ]; fatigue [  ]; nausea [  ]; night sweats [  ]; fever [  ]; or chills [  ];          Dental: poor dentition[  ]; Last Dentist visit:   Eye : blurred vision [ n ]; diplopia [  n ]; vision changes [n  ];  Amaurosis fugax[ n ]; Resp: cough [ y ];  wheezing[ n ];  hemoptysis[  ]; shortness of breath[  ]; paroxysmal nocturnal dyspnea[  ]; dyspnea on exertion[  ]; or orthopnea[  ];  GI:  gallstones[  ], vomiting[ n ];  dysphagia[  ]; melena[  ];  hematochezia [  ]; heartburn[  ];   Hx of  Colonoscopy[  ]; GU: kidney stones [  ]; hematuria[  ];   dysuria [  ];  nocturia[  ];  history of     obstruction [  ]; urinary frequency [  ]             Skin: rash, swelling[  ];, hair loss[  ];  peripheral edema[  ];  or itching[  ]; Musculosketetal: myalgias[  ];  joint swelling[  ];  joint erythema[  ];  joint pain[  ];  back pain[  ];  Heme/Lymph: bruising[  ];  bleeding[  ];  anemia[  ];  Neuro: TIA[ n ];  headaches[y  ];  stroke[  n];  vertigo[  ];  seizures[  ];   paresthesias[  ];  difficulty walking[  ];  Psych:depression[  ]; anxiety[  ];  Endocrine: diabetes[n  ];  thyroid dysfunction[ n ];  Immunizations: Flu up to date [ y ]; Pneumococcal up to date Blue.Reese  ];  Other:  Physical Exam: Ht '4\' 11"'  (1.499 m)    Wt 130 lb (59 kg)   BMI 26.26 kg/m   PHYSICAL EXAMINATION: General appearance: alert, cooperative, appears stated age and no distress Head: normal Neck: no adenopathy, no carotid bruit, no JVD, supple, symmetrical, trachea midline and thyroid not enlarged, symmetric, no tenderness/mass/nodules Lymph nodes: No enlarged cervical or supraventricular axillary lymph nodes are present Resp: clear to auscultation bilaterally Cardio: regular rate and rhythm, S1, S2 normal, no murmur, click, rub or gallop, no rub GI: soft, non-tender; bowel sounds normal; no masses,  no organomegaly Extremities: extremities normal, atraumatic, no cyanosis or edema and Homans sign is negative, no sign of DVT Neurologic: Grossly normal   Diagnostic Studies & Laboratory data:     Recent Radiology Findings:  Nm Pet Image Initial (pi) Skull Base To Thigh  Result Date: 03/18/2017 CLINICAL DATA:  Initial treatment strategy for right upper lobe pulmonary nodule. EXAM: NUCLEAR MEDICINE PET SKULL BASE TO THIGH TECHNIQUE: 6.7 mCi F-18 FDG was injected intravenously. Full-ring PET imaging was performed from the skull base to thigh after the radiotracer. CT data was obtained and used for attenuation correction and anatomic localization. FASTING BLOOD GLUCOSE:  Value: 99 mg/dl COMPARISON:  Chest CT on 02/18/2017 FINDINGS: NECK No hypermetabolic lymph nodes in the neck. CHEST No hypermetabolic mediastinal or hilar nodes. 1.7 cm pulmonary nodule in the superior right upper lobe shows FDG uptake, with SUV max of 3.3. No other suspicious pulmonary nodules seen on CT. No evidence of pleural effusion. Aortic and coronary artery atherosclerosis noted. ABDOMEN/PELVIS No abnormal hypermetabolic activity within the liver, pancreas, adrenal glands, or spleen. No hypermetabolic lymph nodes in the abdomen or pelvis. Prior hysterectomy noted.  Aortic atherosclerosis. SKELETON No focal hypermetabolic activity to suggest skeletal metastasis.  IMPRESSION: 1.7 cm superior right upper lobe pulmonary nodule shows FDG uptake, highly suspicious for primary bronchogenic carcinoma. No evidence of thoracic nodal or distant metastatic disease. Aortic and coronary artery atherosclerosis. Electronically Signed   By: Earle Gell M.D.   On: 03/18/2017 14:42      Final Report  CLINICAL DATA: 76 year old female with history of right upper lobe pulmonary nodule. Followup study.  EXAM: CT CHEST WITH CONTRAST  TECHNIQUE: Multidetector CT imaging of the chest was performed during intravenous contrast administration.  CONTRAST: 60 mL of Isovue 370.  COMPARISON: No prior chest CT. Cervical spine CT 02/08/2017.  FINDINGS: Cardiovascular: Heart size is normal. There is no significant pericardial fluid, thickening or pericardial calcification. There is aortic atherosclerosis, as well as atherosclerosis of the great vessels of the mediastinum and the coronary arteries, including calcified atherosclerotic plaque in the left anterior descending coronary artery.  Mediastinum/Nodes: No pathologically enlarged mediastinal or hilar lymph nodes. Esophagus is unremarkable in appearance. No axillary lymphadenopathy.  Lungs/Pleura: In the periphery of the right upper lobe near the apex there is a 1.8 x 1.4 x 1.2 cm macrolobulated nodule with spiculated margins (axial image 21 of series 4 and coronal image 51 of series 8030), which  is highly suspicious for primary pulmonary neoplasm. This lesion makes contact with the overlying pleura superiorly. No other definite suspicious appearing pulmonary nodules or masses. No acute consolidative airspace disease. No pleural effusions. Scattered areas of mild linear scarring are noted throughout the lung bases bilaterally.  Upper Abdomen: Diffuse low attenuation throughout the visualized hepatic parenchyma, suspicious for hepatic steatosis (difficult to say for certain on today's contrast enhanced study).  Calcified granuloma in the left lobe of the liver. Aortic atherosclerosis.  Musculoskeletal: There are no aggressive appearing lytic or blastic lesions noted in the visualized portions of the skeleton.  IMPRESSION: 1. 1.8 x 1.4 x 1.2 cm macrolobulated nodule with spiculated margins making contact with the overlying pleura in the right upper lobe, highly suspicious for primary pulmonary neoplasm. No mediastinal or hilar lymphadenopathy is noted at this time. Further evaluation with PET-CT is recommended in the near future for diagnostic and staging purposes. 2. Aortic atherosclerosis, in addition to left anterior descending coronary artery disease. Assessment for potential risk factor 3. Probable hepatic steatosis.  modification, dietary therapy or pharmacologic therapy may be warranted, if clinically indicated.These results will be called to the ordering clinician or representative by the Radiologist Assistant, and communication documented in the PACS or zVision Dashboard.   Electronically Signed By: Vinnie Langton M.D. On: 02/19/2017 09:05   I have independently reviewed the above radiology studies  and reviewed the findings with the patient.  PFT's  FEV1 1.83 111% DLCO 12.75 72% Interpretation: The FVC, FEV1, FEV1/FVC ratio and FEF25-75% are within normal limits. . While the TLC and RV are with normal limits, the FRC is reduced. Following administration of bronchodilators, there is no significant response. The reduced diffusing capacity indicates a minimal loss of functional alveolar capillary surface. However, the diffusing capacity was not corrected for the patient's hemoglobin. Pulmonary Function Diagnosis: Minimal Diffusion Defect   Recent Lab Findings: Lab Results  Component Value Date   WBC 5.9 04/07/2017   HGB 10.8 (L) 04/07/2017   HCT 33.6 (L) 04/07/2017   PLT 235 04/07/2017   GLUCOSE 84 10/31/2011   CHOL 222 (H) 06/10/2011   TRIG 90 06/10/2011   HDL 70  06/10/2011   LDLCALC 134 (H) 06/10/2011   ALT 9 06/10/2011   AST 23 06/10/2011   NA 139 10/31/2011   K 4.3 10/31/2011   CL 101 10/31/2011   CREATININE 0.71 10/31/2011   BUN 16 10/31/2011   CO2 28 10/31/2011   INR 0.98 04/07/2017   PATH: FINAL DIAGNOSIS Diagnosis Lung, needle/core biopsy(ies), Right Upper Lobe - ADENOCARCINOMA. Microscopic Comment There is sufficient material for ancillary testing if requested (Block 1A). Dr. Lyndon Code has reviewed the case. Dr. Julien Nordmann was paged on 04/08/2017. 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 Appears to be clinical stage I disease.  #2  calcified coronary arteries with no previous cardiac history  #3 hypercholesterolemia   I discussed with the patient and reviewed with her the needle biopsy results confirming adenocarcinoma the lung and a lifelong nonsmoker. We discussed surgical resection versus stereotactic radiotherapy. The patient has adequate functional reserve and pulmonary function studies to tolerate  surgical resection. I have recommended to her that would be the preferred treatment. We did discuss alternative therapy with stereotactic radiotherapy. Because of the patient's neurologic symptoms that originally brought to light her right lung mass we will obtain an MRI to be sure there is no brain metastasis, though most likely this is stage I carcinoma of the lung.  Patient is willing to proceed and we'll plan for surgery on July 9, her sister and son were here with her.  The patient has no previous cardiac history and is a nonsmoker, but with  her age and extensive calcification of her coronary arteries we will have her see cardiology for clearance.     Grace Isaac MD      [address] [place] [address] Office [phone]   Beeper [phone]  04/10/2017 3:32 PM

## 2017-04-13 ENCOUNTER — Other Ambulatory Visit: Payer: Self-pay | Admitting: *Deleted

## 2017-04-13 ENCOUNTER — Encounter: Payer: Self-pay | Admitting: *Deleted

## 2017-04-13 DIAGNOSIS — C3411 Malignant neoplasm of upper lobe, right bronchus or lung: Secondary | ICD-10-CM | POA: Insufficient documentation

## 2017-04-13 DIAGNOSIS — R911 Solitary pulmonary nodule: Secondary | ICD-10-CM

## 2017-04-14 ENCOUNTER — Ambulatory Visit
Admission: RE | Admit: 2017-04-14 | Discharge: 2017-04-14 | Disposition: A | Discharge status: Home / Self Care | Payer: Medicare HMO | Source: Ambulatory Visit | Attending: Cardiothoracic Surgery | Admitting: Cardiothoracic Surgery

## 2017-04-14 DIAGNOSIS — R918 Other nonspecific abnormal finding of lung field: Secondary | ICD-10-CM | POA: Diagnosis not present

## 2017-04-14 DIAGNOSIS — C7931 Secondary malignant neoplasm of brain: Secondary | ICD-10-CM

## 2017-04-14 DIAGNOSIS — C3411 Malignant neoplasm of upper lobe, right bronchus or lung: Secondary | ICD-10-CM

## 2017-04-14 DIAGNOSIS — H538 Other visual disturbances: Secondary | ICD-10-CM

## 2017-04-14 MED ORDER — GADOBENATE DIMEGLUMINE 529 MG/ML IV SOLN
13.0000 mL | Freq: Once | INTRAVENOUS | Status: AC | PRN
  Administered 2017-04-14: 13 mL via INTRAVENOUS

## 2017-04-14 NOTE — Pre-Procedure Instructions (Signed)
Dawn Lin  04/14/2017      [id number] - [place], [place] - [address] [place] [address] Phone: [phone] Fax: [phone]    Your procedure is scheduled on April 20, 2017  Report to [facility] Admitting at 5:30 A.M.  Call this number if you have problems the morning of surgery:  [phone]   Remember:  Do not eat food or drink liquids after midnight April 19, 2017  Take these medicines the morning of surgery with A SIP OF WATER  amLODipine (NORVASC) and escitalopram (LEXAPRO). Stop taking all Aspirin, Vitamins, Fish oils, and Herbal medicaitons. Also all NSAIDS i.e. Advil, Motrin, Aleve, Anaprox, Naproxen, BC and Goody Powders.    Do not wear jewelry, make-up or nail polish.  Do not wear lotions, powders, or perfumes, or deoderant.  Do not shave 48 hours prior to surgery.   Do not bring valuables to the hospital.  [facility] is not responsible for any belongings or valuables.  Contacts, dentures or bridgework may not be worn into surgery.  Leave your suitcase in the car.  After surgery it may be brought to your room.  For patients admitted to the hospital, discharge time will be determined by your treatment team.   Special instructions:    [facility]- Preparing For Surgery  Before surgery, you can play an important role. Because skin is not sterile, your skin needs to be as free of germs as possible. You can reduce the number of germs on your skin by washing with CHG (chlorahexidine gluconate) Soap before surgery.  CHG is an antiseptic cleaner which kills germs and bonds with the skin to continue killing germs even after washing.  Please do not use if you have an allergy to CHG or antibacterial soaps. If your skin becomes reddened/irritated stop using the CHG.  Do not shave (including legs and underarms) for at least 48 hours prior to first CHG shower. It is OK to shave your face.  Please follow  these instructions carefully.   1. Shower the NIGHT BEFORE SURGERY and the MORNING OF SURGERY with CHG.   2. If you chose to wash your hair, wash your hair first as usual with your normal shampoo.  3. After you shampoo, rinse your hair and body thoroughly to remove the shampoo.  4. Use CHG as you would any other liquid soap. You can apply CHG directly to the skin and wash gently with a scrungie or a clean washcloth.   5. Apply the CHG Soap to your body ONLY FROM THE NECK DOWN.  Do not use on open wounds or open sores. Avoid contact with your eyes, ears, mouth and genitals (private parts). Wash genitals (private parts) with your normal soap.  6. Wash thoroughly, paying special attention to the area where your surgery will be performed.  7. Thoroughly rinse your body with warm water from the neck down.  8. DO NOT shower/wash with your normal soap after using and rinsing off the CHG Soap.  9. Pat yourself dry with a CLEAN TOWEL.   10. Wear CLEAN PAJAMAS   11. Place CLEAN SHEETS on your bed the night of your first shower and DO NOT SLEEP WITH PETS.  Day of Surgery: Do not apply any deodorants/lotions. Please wear clean clothes to the hospital/surgery center.    Please read over the following fact sheets that you were given. Pain Booklet, Coughing and Deep Breathing and Surgical  Site Infection Prevention

## 2017-04-16 ENCOUNTER — Encounter (HOSPITAL_COMMUNITY)
Admission: RE | Admit: 2017-04-16 | Discharge: 2017-04-16 | Disposition: A | Discharge status: Home / Self Care | Payer: Medicare HMO | Source: Ambulatory Visit | Attending: Cardiothoracic Surgery | Admitting: Cardiothoracic Surgery

## 2017-04-16 ENCOUNTER — Encounter (HOSPITAL_COMMUNITY): Payer: Self-pay

## 2017-04-16 ENCOUNTER — Ambulatory Visit: Payer: Medicare HMO | Admitting: Cardiothoracic Surgery

## 2017-04-16 DIAGNOSIS — Z01812 Encounter for preprocedural laboratory examination: Secondary | ICD-10-CM | POA: Insufficient documentation

## 2017-04-16 DIAGNOSIS — R911 Solitary pulmonary nodule: Secondary | ICD-10-CM | POA: Diagnosis not present

## 2017-04-16 HISTORY — DX: Personal history of other medical treatment: Z92.89

## 2017-04-16 HISTORY — DX: Family history of other specified conditions: Z84.89

## 2017-04-16 HISTORY — DX: Anxiety disorder, unspecified: F41.9

## 2017-04-16 HISTORY — DX: Dyspnea, unspecified: R06.00

## 2017-04-16 LAB — URINALYSIS, ROUTINE W REFLEX MICROSCOPIC
Bilirubin Urine: NEGATIVE
Glucose, UA: NEGATIVE mg/dL
Hgb urine dipstick: NEGATIVE
Ketones, ur: NEGATIVE mg/dL
Leukocytes, UA: NEGATIVE
Nitrite: NEGATIVE
Protein, ur: NEGATIVE mg/dL
Specific Gravity, Urine: 1.005 (ref 1.005–1.030)
pH: 6 (ref 5.0–8.0)

## 2017-04-16 LAB — BLOOD GAS, ARTERIAL
Acid-Base Excess: 0.5 mmol/L (ref 0.0–2.0)
Bicarbonate: 23.8 mmol/L (ref 20.0–28.0)
Drawn by: 449841
O2 Saturation: 95.7 %
Patient temperature: 98.6
pCO2 arterial: 32.7 mmHg (ref 32.0–48.0)
pH, Arterial: 7.475 — ABNORMAL HIGH (ref 7.350–7.450)
pO2, Arterial: 78.7 mmHg — ABNORMAL LOW (ref 83.0–108.0)

## 2017-04-16 LAB — COMPREHENSIVE METABOLIC PANEL
ALT: 9 U/L — ABNORMAL LOW (ref 14–54)
AST: 23 U/L (ref 15–41)
Albumin: 4.3 g/dL (ref 3.5–5.0)
Alkaline Phosphatase: 56 U/L (ref 38–126)
Anion gap: 8 (ref 5–15)
BUN: 17 mg/dL (ref 6–20)
CO2: 28 mmol/L (ref 22–32)
Calcium: 9.4 mg/dL (ref 8.9–10.3)
Chloride: 104 mmol/L (ref 101–111)
Creatinine, Ser: 0.77 mg/dL (ref 0.44–1.00)
GFR calc Af Amer: >60 mL/min (ref >60)
GFR calc non Af Amer: >60 mL/min (ref >60)
Glucose, Bld: 94 mg/dL (ref 65–99)
Potassium: 3.7 mmol/L (ref 3.5–5.1)
Sodium: 140 mmol/L (ref 135–145)
Total Bilirubin: 1.3 mg/dL — ABNORMAL HIGH (ref 0.3–1.2)
Total Protein: 7.2 g/dL (ref 6.5–8.1)

## 2017-04-16 LAB — CBC
HCT: 34.7 % — ABNORMAL LOW (ref 36.0–46.0)
Hemoglobin: 11.1 g/dL — ABNORMAL LOW (ref 12.0–15.0)
MCH: 20.1 pg — ABNORMAL LOW (ref 26.0–34.0)
MCHC: 32 g/dL (ref 30.0–36.0)
MCV: 63 fL — ABNORMAL LOW (ref 78.0–100.0)
Platelets: 280 10*3/uL (ref 150–400)
RBC: 5.51 MIL/uL — ABNORMAL HIGH (ref 3.87–5.11)
RDW: 16.2 % — ABNORMAL HIGH (ref 11.5–15.5)
WBC: 6.4 10*3/uL (ref 4.0–10.5)

## 2017-04-16 LAB — SURGICAL PCR SCREEN
MRSA, PCR: NEGATIVE
Staphylococcus aureus: NEGATIVE

## 2017-04-16 LAB — TYPE AND SCREEN
ABO/RH(D): O POS
Antibody Screen: NEGATIVE

## 2017-04-16 LAB — PROTIME-INR
INR: 0.96
Prothrombin Time: 12.7 seconds (ref 11.4–15.2)

## 2017-04-16 LAB — ABO/RH: ABO/RH(D): O POS

## 2017-04-16 LAB — APTT: aPTT: 29 seconds (ref 24–36)

## 2017-04-16 NOTE — Progress Notes (Signed)
New Outpatient Visit Date: 04/17/2017  Referring Provider: Grace Isaac, MD [address] [place], [place] [address]  Chief Complaint: Preoperative evaluation  HPI:  Dawn Lin is a 76 y.o. female who is being seen today for preoperative cardiovascular evaluation at the request of Dr. Servando Snare in anticipation of lung cancer surgery. 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 The mass was incidentally found during hospitalization at [facility] this spring. Patient had experienced flashing lights in her left eye as well as generalized fatigue. CT of the neck and head revealed the right upper lobe nodule and ultimately led to further evaluation by Dr. Servando Snare. Biopsy last month confirmed adenocarcinoma with no evidence of spread by PETCT and brain MRI.  Dawn Lin reports feeling relatively well. Her weakness has resolved, though she continues to have some exertional dyspnea that has progressed over the last year. She and her family attribute this to decreased activity as well as stress. She denies chest pain, palpitations, lightheadedness, edema, orthopnea, and PND. She reports undergoing stripping a stress test many years ago, which he believes was normal. She does not exercise on a regular basis but works out in her yard without  --------------------------------------------------------------------------------------------------  Cardiovascular History & Procedures: Cardiovascular Problems:  Fatigue and dyspnea on exertion  Risk Factors:  Coronary artery calcification on chest CT, hypertension, hyperlipidemia, family history, and age greater than 45  Cath/PCI:  None  CV Surgery:  None  EP Procedures and Devices:  None  Non-Invasive Evaluation(s):  None  Recent CV Pertinent Labs: Lab Results  Component Value Date   CHOL 222 (H) 06/10/2011   HDL 70 06/10/2011   LDLCALC 134 (H)  06/10/2011   TRIG 90 06/10/2011   CHOLHDL 3.2 06/10/2011   INR 0.96 04/16/2017   K 3.7 04/16/2017   BUN 17 04/16/2017   CREATININE 0.77 04/16/2017   CREATININE 0.71 10/31/2011    --------------------------------------------------------------------------------------------------  Past Medical History:  Diagnosis Date  . Anxiety   . Dyspnea    with exertion  . 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 Family history of adverse reaction to anesthesia    Children - N/V  . History of blood transfusion   . Hyperlipidemia   . Other osteoporosis   . Other thalassemia [facility])     Past Surgical History:  Procedure Laterality Date  . COLONOSCOPY    . INGUINAL HERNIA REPAIR Left   . LUMBAR DISC SURGERY  1990's   for severe pain and sciatica  . PARTIAL HYSTERECTOMY  1970's   for bleeding and prolapse    Current Meds  Medication Sig  . amLODipine (NORVASC) 10 MG tablet Take 10 mg by mouth daily.  Marland Kitchen aspirin EC 81 MG tablet Take 81 mg by mouth daily.  . benzonatate (TESSALON) 200 MG capsule Take 200 mg by mouth 3 (three) times daily as needed for cough.  . Calcium-Phosphorus-Vitamin D [id number] MG-MG-UNIT CHEW Chew 1 each by mouth 2 (two) times daily.   Marland Kitchen escitalopram (LEXAPRO) 10 MG tablet Take 10 mg by mouth daily.  . hydrochlorothiazide (HYDRODIURIL) 25 MG tablet Take 1 tablet (25 mg total) by mouth daily.  Javier Docker Oil 350 MG CAPS Take 1 capsule by mouth daily.  . Multiple Vitamin (MULTIVITAMIN WITH MINERALS) TABS tablet Take 1 tablet by mouth daily.    Allergies: Patient has no known allergies.  Social History   Social History  . Marital status: Widowed  Spouse name: N/A  . Number of children: N/A  . Years of education: N/A   Occupational History  . retired    Social History Main Topics  . Smoking status: Never Smoker  . Smokeless tobacco: Never Used  . Alcohol use No  . Drug use: No  . Sexual activity: No   Other Topics Concern  . Not on file    Social History Narrative   Dawn Lin is married. Her husband has been chronically ill for 6 years (2011). He has metastatic prostate cancer and multiple myeloma. 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 She has a lot of family support.     Family History  Problem Relation Age of Onset  . Hypertension Mother   . Heart attack Sister 64    Review of Systems: A 12-system review of systems was performed and was negative except as noted in the HPI.  --------------------------------------------------------------------------------------------------  Physical Exam: BP (!) 142/82 (BP Location: Left Arm)   Pulse 69   Ht '4\' 11"'  (1.499 m)   Wt 129 lb 6.4 oz (58.7 kg)   LMP  (LMP Unknown)   BMI 26.14 kg/m   General:  Well-developed, well-nourished, elderly woman, seated comfortably in the exam room. She is accompanied by her son and daughter-in-law. HEENT: No conjunctival pallor or scleral icterus. Moist mucous membranes. OP clear. Neck: Supple without lymphadenopathy, thyromegaly, JVD, or HJR. No carotid bruit. Lungs: Normal work of breathing. Clear to auscultation bilaterally without wheezes or crackles. Heart: Regular rate and rhythm without murmurs, rubs, or gallops. Non-displaced PMI. Abd: Bowel sounds present. Soft, NT/ND without hepatosplenomegaly Ext: No lower extremity edema. Radial, PT, and DP pulses are 2+ bilaterally Skin: Warm and dry without rash. Neuro: CNIII-XII intact. Strength and fine-touch sensation intact in upper and lower extremities bilaterally. Psych: Normal mood and affect.  I personally observed the patient climbed 2 flights of stairs, which she did without angina. Mild shortness of breath noted.  EKG:  Normal sinus rhythm without abnormalities.  Lab Results  Component Value Date   WBC 6.4 04/16/2017   HGB 11.1 (L) 04/16/2017   HCT 34.7 (L) 04/16/2017   MCV 63.0 (L) 04/16/2017   PLT 280 04/16/2017    Lab Results  Component Value Date    NA 140 04/16/2017   K 3.7 04/16/2017   CL 104 04/16/2017   CO2 28 04/16/2017   BUN 17 04/16/2017   CREATININE 0.77 04/16/2017   GLUCOSE 94 04/16/2017   ALT 9 (L) 04/16/2017    Lab Results  Component Value Date   CHOL 222 (H) 06/10/2011   HDL 70 06/10/2011   LDLCALC 134 (H) 06/10/2011   TRIG 90 06/10/2011   CHOLHDL 3.2 06/10/2011   Outside lipid panel (01/07/17): Total cholesterol 202, triglyceride 93, HDL 66, LDL 117  --------------------------------------------------------------------------------------------------  ASSESSMENT AND PLAN: Preoperative cardiovascular risk assessment in anticipation of surgical resection of lung cancer Ms. Febres has not reported any unstable cardiovascular symptoms. She notes some fatigue and exertional shortness of breath that has developed gradually over the last year. She is able to perform more than 4 METs of activity without angina or significant dyspnea. I feel that it is reasonable to proceed with lung cancer surgery as planned without additional cardiac testing, as additional testing/intervention is unlikely to mitigate her perioperative cardiovascular risk. Based on the combined clinical/surgical risk calculator, her risk for perioperative cardiovascular complications less than 1%.  Dyspnea on exertion and fatigue This is been gradually  progressing over several months. If she continues to have progression of symptoms or develops chest pain, ischemia evaluation will need to be considered, particularly given coronary artery calcification noted on prior PET/CT.  Coronary artery calcification This was incidentally noted on recent PET/CT. I think the patient would benefit from addition of a statin, given her mildly elevated LDL. However, we will defer this until after completion of her lung cancer surgery.  Hypertension Blood pressure is mildly elevated today. I will defer medication changes to Dr. Woody Seller.  Follow-up: Return to clinic in 3  months.  Dawn Bush, MD 04/17/2017 3:21 PM

## 2017-04-16 NOTE — Progress Notes (Signed)
Mrs Fales denies chest pain.  Patient has an appointment with Dr End at [facility] tomorrow- - due to aortic and coronary artery atherosclerosis was seen on PET scan.  Patient had an EKG in February 2018 at PCP Dr Woody Seller' office, I requested records.

## 2017-04-16 NOTE — Pre-Procedure Instructions (Signed)
Dawn Lin  04/16/2017      Your procedure is scheduled on April 20, 2017  Report to [facility] Admitting at 5:30 A.M.                Your surgery or procedure is scheduled for 7:30 AM   Call this number if you have problems the morning of surgery:[phone]                    Remember:  Do not eat food or drink liquids after midnight April 19, 2017   Take these medicines the morning of surgery with A SIP OF WATER :amLODipine (NORVASC) and escitalopram (LEXAPRO).  Stop taking all Aspirin, Vitamins, Fish oils, and Herbal medicaitons. Also all NSAIDS i.e. Advil, Motrin, Aleve, Anaprox, Naproxen, BC and Goody Powders.    Do not wear jewelry, make-up or nail polish.  Do not wear lotions, powders, or perfumes, or deodorant.  Do not shave 48 hours prior to surgery.   Do not bring valuables to the hospital.  [facility] is not responsible for any belongings or valuables.  Contacts, dentures or bridgework may not be worn into surgery.  Leave your suitcase in the car.  After surgery it may be brought to your room.  For patients admitted to the hospital, discharge time will be determined by your treatment team.   Special instructions:    [place]- Preparing For Surgery  Before surgery, you can play an important role. Because skin is not sterile, your skin needs to be as free of germs as possible. You can reduce the number of germs on your skin by washing with CHG (chlorahexidine gluconate) Soap before surgery.  CHG is an antiseptic cleaner which kills germs and bonds with the skin to continue killing germs even after washing.  Please do not use if you have an allergy to CHG or antibacterial soaps. If your skin becomes reddened/irritated stop using the CHG.  Do not shave (including legs and underarms) for at least 48 hours prior to first CHG shower. It is OK to shave your face.  Please follow these instructions carefully.   1. Shower the NIGHT BEFORE SURGERY and  the MORNING OF SURGERY with CHG.   2. If you chose to wash your hair, wash your hair first as usual with your normal shampoo.  3. After you shampoo, rinse your hair and body thoroughly to remove the shampoo.  4. Use CHG as you would any other liquid soap. You can apply CHG directly to the skin and wash gently with a scrungie or a clean washcloth.   5. Apply the CHG Soap to your body ONLY FROM THE NECK DOWN.  Do not use on open wounds or open sores. Avoid contact with your eyes, ears, mouth and genitals (private parts). Wash genitals (private parts) with your normal soap.  6. Wash thoroughly, paying special attention to the area where your surgery will be performed.  7. Thoroughly rinse your body with warm water from the neck down.  8. DO NOT shower/wash with your normal soap after using and rinsing off the CHG Soap.  9. Pat yourself dry with a CLEAN TOWEL.   10. Wear CLEAN PAJAMAS   11. Place CLEAN SHEETS on your bed the night of your first shower and DO NOT SLEEP WITH PETS.  Day of Surgery: Do not apply any deodorants/lotions. Please wear clean clothes to the hospital/surgery center.    Please read over the  following fact sheets that you were given. Pain Booklet, Coughing and Deep Breathing and Surgical Site Infection Prevention

## 2017-04-17 ENCOUNTER — Encounter: Payer: Self-pay | Admitting: Internal Medicine

## 2017-04-17 ENCOUNTER — Ambulatory Visit (INDEPENDENT_AMBULATORY_CARE_PROVIDER_SITE_OTHER): Payer: Medicare HMO | Admitting: Internal Medicine

## 2017-04-17 VITALS — BP 142/82 | HR 69 | Ht 59.0 in | Wt 129.4 lb

## 2017-04-17 DIAGNOSIS — I2510 Atherosclerotic heart disease of native coronary artery without angina pectoris: Secondary | ICD-10-CM

## 2017-04-17 DIAGNOSIS — I2584 Coronary atherosclerosis due to calcified coronary lesion: Secondary | ICD-10-CM | POA: Diagnosis not present

## 2017-04-17 DIAGNOSIS — R0600 Dyspnea, unspecified: Secondary | ICD-10-CM

## 2017-04-17 DIAGNOSIS — R0609 Other forms of dyspnea: Secondary | ICD-10-CM

## 2017-04-17 DIAGNOSIS — Z01810 Encounter for preprocedural cardiovascular examination: Secondary | ICD-10-CM | POA: Diagnosis not present

## 2017-04-17 DIAGNOSIS — I10 Essential (primary) hypertension: Secondary | ICD-10-CM

## 2017-04-17 DIAGNOSIS — C3411 Malignant neoplasm of upper lobe, right bronchus or lung: Secondary | ICD-10-CM

## 2017-04-17 NOTE — Patient Instructions (Signed)
Medication Instructions:  Your physician recommends that you continue on your current medications as directed. Please refer to the Current Medication list given to you today.  Labwork: No new orders.   Testing/Procedures: No new orders.   Follow-Up: Your physician recommends that you schedule a follow-up appointment in: 3 MONTHS with Dr End   Any Other Special Instructions Will Be Listed Below (If Applicable).     If you need a refill on your cardiac medications before your next appointment, please call your pharmacy.

## 2017-04-17 NOTE — Progress Notes (Signed)
Anesthesia Chart Review:  Pt is a 76 year old female scheduled for video bronchoscopy, VATS, R upper lobectomy on 04/20/2017 with Lanelle Bal, MD  - PCP is Jerene Bears, MD - Pt saw cardiologist Nelva Bush, MD for pre-op eval 04/17/17 and was cleared for surgery.   PMH includes:  HTN, thalassemia. Never smoker. BMI 26.5  Medications include: Amlodipine, ASA 81 mg, HCTZ. Last dose ASA 04/10/17.   Preoperative labs reviewed.    1 view CXR 04/07/17: Small less than 10% right apical pneumothorax after biopsy.  EKG 04/17/17 (done at Dr. Darnelle Bos office, not yet scanned into Epic): NSR  If no changes, I anticipate pt can proceed with surgery as scheduled.   Willeen Cass, FNP-BC [facility] Short Stay Surgical Center/Anesthesiology Phone: [phone] 04/17/2017 3:45 PM

## 2017-04-19 NOTE — Anesthesia Preprocedure Evaluation (Addendum)
Anesthesia Evaluation  Patient identified by MRN, date of birth, ID band Patient awake    Reviewed: Allergy & Precautions, NPO status , Patient's Chart, lab work & pertinent test results  Airway Mallampati: II  TM Distance: >3 FB Neck ROM: Full    Dental  (+) Dental Advisory Given   Pulmonary shortness of breath,  RUL lung mass   breath sounds clear to auscultation       Cardiovascular hypertension, Pt. on medications  Rhythm:Regular Rate:Normal     Neuro/Psych Anxiety negative neurological ROS     GI/Hepatic negative GI ROS, Neg liver ROS,   Endo/Other  negative endocrine ROS  Renal/GU negative Renal ROS     Musculoskeletal   Abdominal   Peds  Hematology negative hematology ROS (+) anemia ,   Anesthesia Other Findings   Reproductive/Obstetrics                            Lab Results  Component Value Date   WBC 6.4 04/16/2017   HGB 11.1 (L) 04/16/2017   HCT 34.7 (L) 04/16/2017   MCV 63.0 (L) 04/16/2017   PLT 280 04/16/2017   Lab Results  Component Value Date   CREATININE 0.77 04/16/2017   BUN 17 04/16/2017   NA 140 04/16/2017   K 3.7 04/16/2017   CL 104 04/16/2017   CO2 28 04/16/2017    Anesthesia Physical Anesthesia Plan  ASA: III  Anesthesia Plan: General   Post-op Pain Management:    Induction: Intravenous  PONV Risk Score and Plan: 4 or greater and Ondansetron, Dexamethasone, Propofol, Treatment may vary due to age or medical condition and Midazolam  Airway Management Planned: Double Lumen EBT  Additional Equipment: Arterial line, CVP and Ultrasound Guidance Line Placement  Intra-op Plan:   Post-operative Plan: Extubation in OR and Possible Post-op intubation/ventilation  Informed Consent: I have reviewed the patients History and Physical, chart, labs and discussed the procedure including the risks, benefits and alternatives for the proposed anesthesia with  the patient or authorized representative who has indicated his/her understanding and acceptance.   Dental advisory given  Plan Discussed with: CRNA  Anesthesia Plan Comments:        Anesthesia Quick Evaluation

## 2017-04-20 ENCOUNTER — Inpatient Hospital Stay (HOSPITAL_COMMUNITY): Payer: Medicare HMO | Admitting: Certified Registered Nurse Anesthetist

## 2017-04-20 ENCOUNTER — Inpatient Hospital Stay (HOSPITAL_COMMUNITY): Payer: Medicare HMO

## 2017-04-20 ENCOUNTER — Inpatient Hospital Stay (HOSPITAL_COMMUNITY)
Admission: RE | Admit: 2017-04-20 | Discharge: 2017-05-04 | DRG: 163 | Disposition: A | Discharge status: Home Health | Payer: Medicare HMO | Source: Ambulatory Visit | Attending: Cardiothoracic Surgery | Admitting: Cardiothoracic Surgery

## 2017-04-20 ENCOUNTER — Encounter (HOSPITAL_COMMUNITY)
Admission: RE | Disposition: A | Discharge status: Home Health | Payer: Self-pay | Source: Ambulatory Visit | Attending: Cardiothoracic Surgery

## 2017-04-20 ENCOUNTER — Encounter (HOSPITAL_COMMUNITY): Payer: Self-pay | Admitting: *Deleted

## 2017-04-20 DIAGNOSIS — J60 Coalworker's pneumoconiosis: Secondary | ICD-10-CM | POA: Diagnosis not present

## 2017-04-20 DIAGNOSIS — D62 Acute posthemorrhagic anemia: Secondary | ICD-10-CM | POA: Diagnosis not present

## 2017-04-20 DIAGNOSIS — R0602 Shortness of breath: Secondary | ICD-10-CM | POA: Diagnosis not present

## 2017-04-20 DIAGNOSIS — J939 Pneumothorax, unspecified: Secondary | ICD-10-CM | POA: Diagnosis not present

## 2017-04-20 DIAGNOSIS — J869 Pyothorax without fistula: Secondary | ICD-10-CM | POA: Diagnosis not present

## 2017-04-20 DIAGNOSIS — R911 Solitary pulmonary nodule: Secondary | ICD-10-CM

## 2017-04-20 DIAGNOSIS — Z09 Encounter for follow-up examination after completed treatment for conditions other than malignant neoplasm: Secondary | ICD-10-CM

## 2017-04-20 DIAGNOSIS — B9562 Methicillin resistant Staphylococcus aureus infection as the cause of diseases classified elsewhere: Secondary | ICD-10-CM | POA: Diagnosis not present

## 2017-04-20 DIAGNOSIS — C3411 Malignant neoplasm of upper lobe, right bronchus or lung: Principal | ICD-10-CM | POA: Diagnosis present

## 2017-04-20 DIAGNOSIS — I10 Essential (primary) hypertension: Secondary | ICD-10-CM | POA: Diagnosis present

## 2017-04-20 DIAGNOSIS — E876 Hypokalemia: Secondary | ICD-10-CM | POA: Diagnosis present

## 2017-04-20 DIAGNOSIS — Z4682 Encounter for fitting and adjustment of non-vascular catheter: Secondary | ICD-10-CM

## 2017-04-20 DIAGNOSIS — E785 Hyperlipidemia, unspecified: Secondary | ICD-10-CM | POA: Diagnosis not present

## 2017-04-20 DIAGNOSIS — D568 Other thalassemias: Secondary | ICD-10-CM | POA: Diagnosis not present

## 2017-04-20 DIAGNOSIS — T814XXA Infection following a procedure, initial encounter: Secondary | ICD-10-CM | POA: Diagnosis not present

## 2017-04-20 DIAGNOSIS — I700 Atherosclerosis of aorta: Secondary | ICD-10-CM | POA: Diagnosis not present

## 2017-04-20 DIAGNOSIS — E7800 Pure hypercholesterolemia, unspecified: Secondary | ICD-10-CM | POA: Diagnosis not present

## 2017-04-20 DIAGNOSIS — Z902 Acquired absence of lung [part of]: Secondary | ICD-10-CM

## 2017-04-20 DIAGNOSIS — Z8249 Family history of ischemic heart disease and other diseases of the circulatory system: Secondary | ICD-10-CM

## 2017-04-20 DIAGNOSIS — J449 Chronic obstructive pulmonary disease, unspecified: Secondary | ICD-10-CM | POA: Diagnosis not present

## 2017-04-20 DIAGNOSIS — Z9689 Presence of other specified functional implants: Secondary | ICD-10-CM | POA: Diagnosis not present

## 2017-04-20 DIAGNOSIS — J9811 Atelectasis: Secondary | ICD-10-CM | POA: Diagnosis not present

## 2017-04-20 DIAGNOSIS — I2510 Atherosclerotic heart disease of native coronary artery without angina pectoris: Secondary | ICD-10-CM | POA: Diagnosis present

## 2017-04-20 DIAGNOSIS — R0600 Dyspnea, unspecified: Secondary | ICD-10-CM | POA: Diagnosis not present

## 2017-04-20 DIAGNOSIS — Z01811 Encounter for preprocedural respiratory examination: Secondary | ICD-10-CM

## 2017-04-20 DIAGNOSIS — T8182XA Emphysema (subcutaneous) resulting from a procedure, initial encounter: Secondary | ICD-10-CM | POA: Diagnosis not present

## 2017-04-20 DIAGNOSIS — Z803 Family history of malignant neoplasm of breast: Secondary | ICD-10-CM | POA: Diagnosis not present

## 2017-04-20 DIAGNOSIS — Z9889 Other specified postprocedural states: Secondary | ICD-10-CM

## 2017-04-20 DIAGNOSIS — R918 Other nonspecific abnormal finding of lung field: Secondary | ICD-10-CM | POA: Diagnosis present

## 2017-04-20 DIAGNOSIS — R079 Chest pain, unspecified: Secondary | ICD-10-CM | POA: Diagnosis not present

## 2017-04-20 DIAGNOSIS — Z452 Encounter for adjustment and management of vascular access device: Secondary | ICD-10-CM | POA: Diagnosis not present

## 2017-04-20 DIAGNOSIS — D569 Thalassemia, unspecified: Secondary | ICD-10-CM | POA: Diagnosis not present

## 2017-04-20 DIAGNOSIS — D72829 Elevated white blood cell count, unspecified: Secondary | ICD-10-CM | POA: Diagnosis not present

## 2017-04-20 DIAGNOSIS — R0789 Other chest pain: Secondary | ICD-10-CM | POA: Diagnosis not present

## 2017-04-20 DIAGNOSIS — T8182XS Emphysema (subcutaneous) resulting from a procedure, sequela: Secondary | ICD-10-CM

## 2017-04-20 HISTORY — PX: VIDEO ASSISTED THORACOSCOPY (VATS)/ LOBECTOMY: SHX6169

## 2017-04-20 HISTORY — PX: VIDEO BRONCHOSCOPY: SHX5072

## 2017-04-20 LAB — GLUCOSE, CAPILLARY: Glucose-Capillary: 208 mg/dL — ABNORMAL HIGH (ref 65–99)

## 2017-04-20 SURGERY — BRONCHOSCOPY, VIDEO-ASSISTED
Anesthesia: General | Site: Chest | Laterality: Right

## 2017-04-20 MED ORDER — DEXTROSE-NACL 5-0.9 % IV SOLN
INTRAVENOUS | Status: DC
  Administered 2017-04-20 – 2017-04-21 (×2): via INTRAVENOUS

## 2017-04-20 MED ORDER — SODIUM CHLORIDE 0.9% FLUSH: 9.0000 mL | INTRAVENOUS | Status: DC | PRN

## 2017-04-20 MED ORDER — DIPHENHYDRAMINE HCL 12.5 MG/5ML PO ELIX: 12.5000 mg | ORAL_SOLUTION | Freq: Four times a day (QID) | ORAL | Status: DC | PRN

## 2017-04-20 MED ORDER — LIDOCAINE 2% (20 MG/ML) 5 ML SYRINGE
INTRAMUSCULAR | Status: DC | PRN
  Administered 2017-04-20: 60 mg via INTRAVENOUS

## 2017-04-20 MED ORDER — PROPOFOL 10 MG/ML IV BOLUS
INTRAVENOUS | Status: DC | PRN
  Administered 2017-04-20: 20 mg via INTRAVENOUS
  Administered 2017-04-20: 30 mg via INTRAVENOUS
  Administered 2017-04-20: 150 mg via INTRAVENOUS
  Administered 2017-04-20: 50 mg via INTRAVENOUS
  Administered 2017-04-20: 20 mg via INTRAVENOUS

## 2017-04-20 MED ORDER — PANTOPRAZOLE SODIUM 40 MG PO TBEC
40.0000 mg | DELAYED_RELEASE_TABLET | Freq: Every day | ORAL | Status: DC
  Administered 2017-04-21 – 2017-05-04 (×14): 40 mg via ORAL
  Filled 2017-04-20 (×14): qty 1

## 2017-04-20 MED ORDER — FENTANYL CITRATE (PF) 250 MCG/5ML IJ SOLN
INTRAMUSCULAR | Status: AC
  Filled 2017-04-20: qty 5

## 2017-04-20 MED ORDER — BUPIVACAINE 0.5 % ON-Q PUMP SINGLE CATH 400 ML
400.0000 mL | INJECTION | Status: AC
  Administered 2017-04-20: 400 mL
  Filled 2017-04-20: qty 400

## 2017-04-20 MED ORDER — TRAMADOL HCL 50 MG PO TABS
50.0000 mg | ORAL_TABLET | Freq: Four times a day (QID) | ORAL | Status: DC | PRN
  Administered 2017-04-23 – 2017-04-24 (×2): 100 mg via ORAL
  Administered 2017-04-25 (×2): 50 mg via ORAL
  Administered 2017-04-25 – 2017-05-03 (×12): 100 mg via ORAL
  Filled 2017-04-20: qty 2
  Filled 2017-04-20: qty 1
  Filled 2017-04-20 (×5): qty 2
  Filled 2017-04-20: qty 1
  Filled 2017-04-20 (×8): qty 2

## 2017-04-20 MED ORDER — HEMOSTATIC AGENTS (NO CHARGE) OPTIME
TOPICAL | Status: DC | PRN
  Administered 2017-04-20: 1 via TOPICAL

## 2017-04-20 MED ORDER — ALBUTEROL SULFATE (2.5 MG/3ML) 0.083% IN NEBU
2.5000 mg | INHALATION_SOLUTION | RESPIRATORY_TRACT | Status: DC
  Administered 2017-04-20 – 2017-04-21 (×3): 2.5 mg via RESPIRATORY_TRACT
  Filled 2017-04-20 (×3): qty 3

## 2017-04-20 MED ORDER — SUGAMMADEX SODIUM 200 MG/2ML IV SOLN
INTRAVENOUS | Status: DC | PRN
  Administered 2017-04-20: 200 mg via INTRAVENOUS

## 2017-04-20 MED ORDER — ONDANSETRON HCL 4 MG/2ML IJ SOLN
INTRAMUSCULAR | Status: AC
  Filled 2017-04-20: qty 2

## 2017-04-20 MED ORDER — MIDAZOLAM HCL 2 MG/2ML IJ SOLN
INTRAMUSCULAR | Status: AC
  Filled 2017-04-20: qty 2

## 2017-04-20 MED ORDER — DEXTROSE 5 % IV SOLN
1.5000 g | INTRAVENOUS | Status: DC
  Filled 2017-04-20: qty 1.5

## 2017-04-20 MED ORDER — ESCITALOPRAM OXALATE 10 MG PO TABS
10.0000 mg | ORAL_TABLET | Freq: Every day | ORAL | Status: DC
  Administered 2017-04-21 – 2017-05-04 (×14): 10 mg via ORAL
  Filled 2017-04-20 (×14): qty 1

## 2017-04-20 MED ORDER — MIDAZOLAM HCL 5 MG/5ML IJ SOLN
INTRAMUSCULAR | Status: DC | PRN
  Administered 2017-04-20 (×2): .5 mg via INTRAVENOUS

## 2017-04-20 MED ORDER — DEXTROSE 5 % IV SOLN
1.5000 g | INTRAVENOUS | Status: AC
  Administered 2017-04-20 (×2): 1.5 g via INTRAVENOUS
  Filled 2017-04-20: qty 1.5

## 2017-04-20 MED ORDER — BUPIVACAINE HCL (PF) 0.5 % IJ SOLN
INTRAMUSCULAR | Status: AC
  Filled 2017-04-20: qty 10

## 2017-04-20 MED ORDER — ONDANSETRON HCL 4 MG/2ML IJ SOLN
4.0000 mg | Freq: Four times a day (QID) | INTRAMUSCULAR | Status: DC | PRN
  Administered 2017-04-21: 4 mg via INTRAVENOUS

## 2017-04-20 MED ORDER — ONDANSETRON HCL 4 MG/2ML IJ SOLN
4.0000 mg | Freq: Four times a day (QID) | INTRAMUSCULAR | Status: DC | PRN
  Filled 2017-04-20: qty 2

## 2017-04-20 MED ORDER — BUPIVACAINE HCL (PF) 0.5 % IJ SOLN
INTRAMUSCULAR | Status: DC | PRN
  Administered 2017-04-20: 5 mL

## 2017-04-20 MED ORDER — LACTATED RINGERS IV SOLN
INTRAVENOUS | Status: DC | PRN
  Administered 2017-04-20 (×2): via INTRAVENOUS

## 2017-04-20 MED ORDER — SENNOSIDES-DOCUSATE SODIUM 8.6-50 MG PO TABS
1.0000 | ORAL_TABLET | Freq: Every day | ORAL | Status: DC
  Administered 2017-04-20 – 2017-05-02 (×12): 1 via ORAL
  Filled 2017-04-20 (×13): qty 1

## 2017-04-20 MED ORDER — PHENYLEPHRINE 40 MCG/ML (10ML) SYRINGE FOR IV PUSH (FOR BLOOD PRESSURE SUPPORT)
PREFILLED_SYRINGE | INTRAVENOUS | Status: DC | PRN
  Administered 2017-04-20 (×3): 40 ug via INTRAVENOUS
  Administered 2017-04-20: 20 ug via INTRAVENOUS

## 2017-04-20 MED ORDER — DEXAMETHASONE SODIUM PHOSPHATE 10 MG/ML IJ SOLN
INTRAMUSCULAR | Status: DC | PRN
  Administered 2017-04-20: 10 mg via INTRAVENOUS

## 2017-04-20 MED ORDER — LIDOCAINE HCL (CARDIAC) 20 MG/ML IV SOLN
INTRAVENOUS | Status: AC
Start: 2017-04-20 — End: 2017-04-20
  Filled 2017-04-20: qty 5

## 2017-04-20 MED ORDER — HYDROCHLOROTHIAZIDE 25 MG PO TABS
25.0000 mg | ORAL_TABLET | Freq: Every day | ORAL | Status: DC
  Administered 2017-04-21 – 2017-05-04 (×14): 25 mg via ORAL
  Filled 2017-04-20 (×14): qty 1

## 2017-04-20 MED ORDER — PROMETHAZINE HCL 25 MG/ML IJ SOLN: 6.2500 mg | INTRAMUSCULAR | Status: DC | PRN

## 2017-04-20 MED ORDER — NALOXONE HCL 0.4 MG/ML IJ SOLN: 0.4000 mg | INTRAMUSCULAR | Status: DC | PRN

## 2017-04-20 MED ORDER — ACETAMINOPHEN 500 MG PO TABS
1000.0000 mg | ORAL_TABLET | Freq: Four times a day (QID) | ORAL | Status: DC
  Administered 2017-04-20 – 2017-04-24 (×16): 1000 mg via ORAL
  Filled 2017-04-20 (×18): qty 2

## 2017-04-20 MED ORDER — DEXTROSE 5 % IV SOLN
1.5000 g | Freq: Two times a day (BID) | INTRAVENOUS | Status: AC
  Administered 2017-04-21 (×2): 1.5 g via INTRAVENOUS
  Filled 2017-04-20 (×2): qty 1.5

## 2017-04-20 MED ORDER — EPHEDRINE SULFATE-NACL 50-0.9 MG/10ML-% IV SOSY
PREFILLED_SYRINGE | INTRAVENOUS | Status: DC | PRN
  Administered 2017-04-20: 10 mg via INTRAVENOUS
  Administered 2017-04-20 (×4): 5 mg via INTRAVENOUS

## 2017-04-20 MED ORDER — EPHEDRINE 5 MG/ML INJ
INTRAVENOUS | Status: AC
  Filled 2017-04-20: qty 10

## 2017-04-20 MED ORDER — ASPIRIN EC 81 MG PO TBEC
81.0000 mg | DELAYED_RELEASE_TABLET | Freq: Every day | ORAL | Status: DC
  Administered 2017-04-21 – 2017-05-04 (×14): 81 mg via ORAL
  Filled 2017-04-20 (×14): qty 1

## 2017-04-20 MED ORDER — FENTANYL CITRATE (PF) 100 MCG/2ML IJ SOLN
INTRAMUSCULAR | Status: DC | PRN
  Administered 2017-04-20: 25 ug via INTRAVENOUS
  Administered 2017-04-20: 50 ug via INTRAVENOUS
  Administered 2017-04-20 (×2): 25 ug via INTRAVENOUS
  Administered 2017-04-20: 75 ug via INTRAVENOUS
  Administered 2017-04-20: 25 ug via INTRAVENOUS

## 2017-04-20 MED ORDER — AMLODIPINE BESYLATE 10 MG PO TABS
10.0000 mg | ORAL_TABLET | Freq: Every day | ORAL | Status: DC
  Administered 2017-04-21 – 2017-05-04 (×14): 10 mg via ORAL
  Filled 2017-04-20 (×14): qty 1

## 2017-04-20 MED ORDER — FENTANYL 40 MCG/ML IV SOLN
INTRAVENOUS | Status: DC
  Administered 2017-04-20: 30 ug via INTRAVENOUS
  Administered 2017-04-20: 1000 ug via INTRAVENOUS
  Administered 2017-04-21: 20 ug via INTRAVENOUS
  Administered 2017-04-21 (×2): 40 ug via INTRAVENOUS
  Administered 2017-04-21: 10 ug via INTRAVENOUS
  Administered 2017-04-21: 60 ug via INTRAVENOUS
  Administered 2017-04-21: 0 ug via INTRAVENOUS
  Administered 2017-04-21: 40 ug via INTRAVENOUS
  Administered 2017-04-22: 30 ug via INTRAVENOUS
  Administered 2017-04-22: 0 ug via INTRAVENOUS
  Administered 2017-04-22: 20 ug via INTRAVENOUS
  Administered 2017-04-22: 10 ug via INTRAVENOUS
  Administered 2017-04-22 – 2017-04-23 (×3): 30 ug via INTRAVENOUS
  Administered 2017-04-23: 10 ug via INTRAVENOUS
  Filled 2017-04-20: qty 25

## 2017-04-20 MED ORDER — POTASSIUM CHLORIDE 10 MEQ/50ML IV SOLN
10.0000 meq | Freq: Every day | INTRAVENOUS | Status: DC | PRN
  Administered 2017-04-24 (×3): 10 meq via INTRAVENOUS
  Filled 2017-04-20: qty 50

## 2017-04-20 MED ORDER — DEXAMETHASONE SODIUM PHOSPHATE 10 MG/ML IJ SOLN
INTRAMUSCULAR | Status: AC
  Filled 2017-04-20: qty 1

## 2017-04-20 MED ORDER — ROCURONIUM BROMIDE 50 MG/5ML IV SOLN
INTRAVENOUS | Status: AC
  Filled 2017-04-20: qty 1

## 2017-04-20 MED ORDER — ONDANSETRON HCL 4 MG/2ML IJ SOLN
INTRAMUSCULAR | Status: DC | PRN
  Administered 2017-04-20: 4 mg via INTRAVENOUS

## 2017-04-20 MED ORDER — BISACODYL 5 MG PO TBEC
10.0000 mg | DELAYED_RELEASE_TABLET | Freq: Every day | ORAL | Status: DC
  Administered 2017-04-20 – 2017-05-04 (×14): 10 mg via ORAL
  Filled 2017-04-20 (×14): qty 2

## 2017-04-20 MED ORDER — ROCURONIUM BROMIDE 10 MG/ML (PF) SYRINGE
PREFILLED_SYRINGE | INTRAVENOUS | Status: DC | PRN
  Administered 2017-04-20 (×6): 5 mg via INTRAVENOUS
  Administered 2017-04-20: 40 mg via INTRAVENOUS
  Administered 2017-04-20: 10 mg via INTRAVENOUS
  Administered 2017-04-20: 5 mg via INTRAVENOUS

## 2017-04-20 MED ORDER — HYDROMORPHONE HCL 1 MG/ML IJ SOLN: 0.2500 mg | INTRAMUSCULAR | Status: DC | PRN

## 2017-04-20 MED ORDER — PROPOFOL 10 MG/ML IV BOLUS
INTRAVENOUS | Status: AC
  Filled 2017-04-20: qty 20

## 2017-04-20 MED ORDER — ACETAMINOPHEN 160 MG/5ML PO SOLN: 1000.0000 mg | Freq: Four times a day (QID) | ORAL | Status: DC

## 2017-04-20 MED ORDER — DEXTROSE 5 % IV SOLN
INTRAVENOUS | Status: DC | PRN
  Administered 2017-04-20: 15 ug/min via INTRAVENOUS

## 2017-04-20 MED ORDER — DIPHENHYDRAMINE HCL 50 MG/ML IJ SOLN: 12.5000 mg | Freq: Four times a day (QID) | INTRAMUSCULAR | Status: DC | PRN

## 2017-04-20 MED ORDER — 0.9 % SODIUM CHLORIDE (POUR BTL) OPTIME
TOPICAL | Status: DC | PRN
  Administered 2017-04-20: 2000 mL

## 2017-04-20 MED ORDER — LACTATED RINGERS IV SOLN
INTRAVENOUS | Status: DC | PRN
  Administered 2017-04-20: 07:00:00 via INTRAVENOUS

## 2017-04-20 SURGICAL SUPPLY — 92 items
APPLICATOR TIP EXT COSEAL (VASCULAR PRODUCTS) IMPLANT
APPLIER CLIP 5 13 M/L LIGAMAX5 (MISCELLANEOUS) ×3
BLADE SURG 11 STRL SS (BLADE) ×3 IMPLANT
BRUSH CYTOL CELLEBRITY 1.5X140 (MISCELLANEOUS) IMPLANT
CANISTER SUCT 3000ML PPV (MISCELLANEOUS) ×3 IMPLANT
CATH KIT ON Q 5IN SLV (PAIN MANAGEMENT) IMPLANT
CATH KIT ON-Q SILVERSOAK 5IN (CATHETERS) ×3 IMPLANT
CATH THORACIC 28FR (CATHETERS) ×3 IMPLANT
CATH THORACIC 36FR (CATHETERS) IMPLANT
CATH THORACIC 36FR RT ANG (CATHETERS) IMPLANT
CLIP APPLIE 5 13 M/L LIGAMAX5 (MISCELLANEOUS) ×2 IMPLANT
CONN ST 1/4X3/8  BEN (MISCELLANEOUS)
CONN ST 1/4X3/8 BEN (MISCELLANEOUS) IMPLANT
CONN Y 3/8X3/8X3/8  BEN (MISCELLANEOUS)
CONN Y 3/8X3/8X3/8 BEN (MISCELLANEOUS) IMPLANT
CONT SPEC 4OZ CLIKSEAL STRL BL (MISCELLANEOUS) ×21 IMPLANT
COVER BACK TABLE 60X90IN (DRAPES) ×3 IMPLANT
COVER SURGICAL LIGHT HANDLE (MISCELLANEOUS) ×3 IMPLANT
DERMABOND ADVANCED (GAUZE/BANDAGES/DRESSINGS) ×1
DERMABOND ADVANCED .7 DNX12 (GAUZE/BANDAGES/DRESSINGS) ×2 IMPLANT
DRAIN CHANNEL 28F RND 3/8 FF (WOUND CARE) ×3 IMPLANT
DRAPE LAPAROSCOPIC ABDOMINAL (DRAPES) ×3 IMPLANT
DRAPE WARM FLUID 44X44 (DRAPE) ×3 IMPLANT
DRILL BIT 7/64X5 (BIT) IMPLANT
ELECT BLADE 4.0 EZ CLEAN MEGAD (MISCELLANEOUS) ×3
ELECT BLADE 6.5 EXT (BLADE) ×3 IMPLANT
ELECT REM PT RETURN 9FT ADLT (ELECTROSURGICAL) ×3
ELECTRODE BLDE 4.0 EZ CLN MEGD (MISCELLANEOUS) ×2 IMPLANT
ELECTRODE REM PT RTRN 9FT ADLT (ELECTROSURGICAL) ×2 IMPLANT
FORCEPS BIOP RJ4 1.8 (CUTTING FORCEPS) IMPLANT
GAUZE SPONGE 4X4 12PLY STRL (GAUZE/BANDAGES/DRESSINGS) ×3 IMPLANT
GLOVE BIO SURGEON STRL SZ 6.5 (GLOVE) ×6 IMPLANT
GLOVE BIOGEL M 6.5 STRL (GLOVE) ×6 IMPLANT
GLOVE BIOGEL M 7.0 STRL (GLOVE) ×3 IMPLANT
GLOVE BIOGEL M STRL SZ7.5 (GLOVE) ×3 IMPLANT
GLOVE BIOGEL PI IND STRL 6.5 (GLOVE) ×2 IMPLANT
GLOVE BIOGEL PI INDICATOR 6.5 (GLOVE) ×1
GOWN STRL REUS W/ TWL LRG LVL3 (GOWN DISPOSABLE) ×8 IMPLANT
GOWN STRL REUS W/TWL LRG LVL3 (GOWN DISPOSABLE) ×4
KIT BASIN OR (CUSTOM PROCEDURE TRAY) ×3 IMPLANT
KIT CLEAN ENDO COMPLIANCE (KITS) ×3 IMPLANT
KIT ROOM TURNOVER OR (KITS) ×3 IMPLANT
MARKER SKIN DUAL TIP RULER LAB (MISCELLANEOUS) IMPLANT
NS IRRIG 1000ML POUR BTL (IV SOLUTION) ×9 IMPLANT
OIL SILICONE PENTAX (PARTS (SERVICE/REPAIRS)) ×3 IMPLANT
PACK CHEST (CUSTOM PROCEDURE TRAY) ×3 IMPLANT
PAD ARMBOARD 7.5X6 YLW CONV (MISCELLANEOUS) ×6 IMPLANT
PASSER SUT SWANSON 36MM LOOP (INSTRUMENTS) IMPLANT
POUCH ENDO CATCH II 15MM (MISCELLANEOUS) ×3 IMPLANT
RELOAD GREEN ECHELON 45 (STAPLE) ×3 IMPLANT
SEALANT PROGEL (MISCELLANEOUS) IMPLANT
SEALANT SURG COSEAL 4ML (VASCULAR PRODUCTS) IMPLANT
SEALANT SURG COSEAL 8ML (VASCULAR PRODUCTS) IMPLANT
SOLUTION ANTI FOG 6CC (MISCELLANEOUS) ×3 IMPLANT
SPONGE INTESTINAL PEANUT (DISPOSABLE) ×6 IMPLANT
STAPLE RELOAD 2.5MM WHITE (STAPLE) ×12 IMPLANT
STAPLE RELOAD 45MM GOLD (STAPLE) ×12 IMPLANT
STAPLER ECHELON POWERED (MISCELLANEOUS) ×3 IMPLANT
STAPLER VASCULAR ECHELON 35 (CUTTER) ×3 IMPLANT
SUT PROLENE 3 0 SH DA (SUTURE) IMPLANT
SUT PROLENE 4 0 RB 1 (SUTURE)
SUT PROLENE 4-0 RB1 .5 CRCL 36 (SUTURE) IMPLANT
SUT SILK  1 MH (SUTURE) ×5
SUT SILK 1 MH (SUTURE) ×10 IMPLANT
SUT SILK 1 TIES 10X30 (SUTURE) IMPLANT
SUT SILK 2 0 SH (SUTURE) ×3 IMPLANT
SUT SILK 2 0SH CR/8 30 (SUTURE) IMPLANT
SUT SILK 3 0SH CR/8 30 (SUTURE) ×3 IMPLANT
SUT VIC AB 1 CTX 18 (SUTURE) ×3 IMPLANT
SUT VIC AB 1 CTX 36 (SUTURE)
SUT VIC AB 1 CTX36XBRD ANBCTR (SUTURE) IMPLANT
SUT VIC AB 2-0 CTX 36 (SUTURE) ×3 IMPLANT
SUT VIC AB 3-0 SH 27 (SUTURE) ×3
SUT VIC AB 3-0 SH 27X BRD (SUTURE) ×6 IMPLANT
SUT VIC AB 3-0 X1 27 (SUTURE) ×3 IMPLANT
SUT VICRYL 0 UR6 27IN ABS (SUTURE) IMPLANT
SUT VICRYL 2 TP 1 (SUTURE) IMPLANT
SYR 20ML ECCENTRIC (SYRINGE) ×3 IMPLANT
SYSTEM SAHARA CHEST DRAIN RE-I (WOUND CARE) ×3 IMPLANT
TAPE CLOTH 4X10 WHT NS (GAUZE/BANDAGES/DRESSINGS) ×3 IMPLANT
TAPE UMBILICAL COTTON 1/8X30 (MISCELLANEOUS) ×3 IMPLANT
TIP APPLICATOR SPRAY EXTEND 16 (VASCULAR PRODUCTS) IMPLANT
TOWEL GREEN STERILE (TOWEL DISPOSABLE) ×12 IMPLANT
TOWEL GREEN STERILE FF (TOWEL DISPOSABLE) ×6 IMPLANT
TOWEL OR 17X24 6PK STRL BLUE (TOWEL DISPOSABLE) ×6 IMPLANT
TOWEL OR 17X26 10 PK STRL BLUE (TOWEL DISPOSABLE) ×6 IMPLANT
TRAP SPECIMEN MUCOUS 40CC (MISCELLANEOUS) IMPLANT
TRAY FOLEY W/METER SILVER 16FR (SET/KITS/TRAYS/PACK) ×3 IMPLANT
TROCAR BLADELESS 12MM (ENDOMECHANICALS) ×3 IMPLANT
TUBE CONNECTING 20X1/4 (TUBING) ×3 IMPLANT
TUNNELER SHEATH ON-Q 11GX8 DSP (PAIN MANAGEMENT) ×3 IMPLANT
WATER STERILE IRR 1000ML POUR (IV SOLUTION) ×6 IMPLANT

## 2017-04-20 NOTE — Anesthesia Postprocedure Evaluation (Signed)
Anesthesia Post Note  Patient: [name]  Procedure(s) Performed: Procedure(s) (LRB): VIDEO BRONCHOSCOPY (N/A) VIDEO ASSISTED THORACOSCOPY (VATS)/RIGHT UPPER LOBECTOMY (Right)     Patient location during evaluation: PACU Anesthesia Type: General Level of consciousness: awake and alert Pain management: pain level controlled Vital Signs Assessment: post-procedure vital signs reviewed and stable Respiratory status: spontaneous breathing, nonlabored ventilation, respiratory function stable and patient connected to nasal cannula oxygen Cardiovascular status: blood pressure returned to baseline and stable Postop Assessment: no signs of nausea or vomiting Anesthetic complications: no    Last Vitals:  Vitals:   04/20/17 1345 04/20/17 1400  BP: (!) 97/49 (!) 97/49  Pulse: 76 74  Resp: 16 16  Temp:      Last Pain:  Vitals:   04/20/17 1400  TempSrc:   PainSc: Tyler Deis

## 2017-04-20 NOTE — Anesthesia Procedure Notes (Signed)
Arterial Line Insertion Performed by: Kathrin Ruddy W  Patient location: Pre-op. radial was placed  Attempts: 2 Procedure performed without using ultrasound guided technique. Following insertion, dressing applied and Biopatch. Post procedure assessment: unchanged  Patient tolerated the procedure well with no immediate complications. Additional procedure comments: Performed by diana huggins srna.

## 2017-04-20 NOTE — Transfer of Care (Signed)
Immediate Anesthesia Transfer of Care Note  Patient: Dawn Lin  Procedure(s) Performed: Procedure(s): VIDEO BRONCHOSCOPY (N/A) VIDEO ASSISTED THORACOSCOPY (VATS)/RIGHT UPPER LOBECTOMY (Right)  Patient Location: PACU  Anesthesia Type:General  Level of Consciousness: drowsy  Airway & Oxygen Therapy: Patient Spontanous Breathing and Patient connected to face mask oxygen  Post-op Assessment: Report given to RN and Post -op Vital signs reviewed and stable  Post vital signs: Reviewed and stable  Last Vitals:  Vitals:   04/20/17 1305 04/20/17 1315  BP: (!) 116/57   Pulse: 74 74  Resp: (!) 24 18  Temp:      Last Pain:  Vitals:   04/20/17 0617  TempSrc: Oral      Patients Stated Pain Goal: 3 (68/12/75 1700)  Complications: No apparent anesthesia complications

## 2017-04-20 NOTE — Anesthesia Procedure Notes (Signed)
Central Venous Catheter Insertion Performed by: Suzette Battiest, anesthesiologist Start/End7/06/2017 7:00 AM, 04/20/2017 7:15 AM Patient location: Pre-op. Preanesthetic checklist: patient identified, IV checked, site marked, risks and benefits discussed, surgical consent, monitors and equipment checked, pre-op evaluation, timeout performed and anesthesia consent Position: Trendelenburg Lidocaine 1% used for infiltration and patient sedated Hand hygiene performed , maximum sterile barriers used  and Seldinger technique used Catheter size: 8 Fr Total catheter length 16. Central line was placed.Double lumen Procedure performed using ultrasound guided technique. Ultrasound Notes:anatomy identified, needle tip was noted to be adjacent to the nerve/plexus identified, no ultrasound evidence of intravascular and/or intraneural injection and image(s) printed for medical record Attempts: 1 Following insertion, dressing applied, line sutured and Biopatch. Post procedure assessment: blood return through all ports  Patient tolerated the procedure well with no immediate complications.

## 2017-04-20 NOTE — Anesthesia Procedure Notes (Addendum)
Procedure Name: Intubation Date/Time: 04/20/2017 8:02 AM Performed by: Merdis Delay Pre-anesthesia Checklist: Patient identified, Emergency Drugs available, Suction available, Patient being monitored and Timeout performed Patient Re-evaluated:Patient Re-evaluated prior to inductionOxygen Delivery Method: Circle system utilized Preoxygenation: Pre-oxygenation with 100% oxygen Intubation Type: IV induction Ventilation: Mask ventilation without difficulty and Oral airway inserted - appropriate to patient size Laryngoscope Size: Mac and 3 Grade View: Grade III Tube type: Oral Tube size: 8.5 mm Number of attempts: 1 Airway Equipment and Method: Stylet Placement Confirmation: ETT inserted through vocal cords under direct vision,  positive ETCO2 and breath sounds checked- equal and bilateral Secured at: 22 cm Tube secured with: Tape Dental Injury: Teeth and Oropharynx as per pre-operative assessment  Difficulty Due To: Difficulty was unanticipated Comments: Performed by dianna huggins srna

## 2017-04-20 NOTE — Progress Notes (Signed)
CT surgery p.m. Rounds  Patient had a right lobectomy today Resting comfortably Mild air leak with cough minimal serosanguineous drainage from tubes Saturation 98% Good breath sounds on right side

## 2017-04-20 NOTE — Brief Op Note (Signed)
04/20/2017  12:30 PM  PATIENT:  Dawn Lin  75 y.o. female  PRE-OPERATIVE DIAGNOSIS:  RUL LUNG NODULE  POST-OPERATIVE DIAGNOSIS:  RUL LUNG NODULE  PROCEDURE:  Procedure(s): VIDEO BRONCHOSCOPY (N/A) VIDEO ASSISTED THORACOSCOPY (VATS)/RIGHT UPPER LOBECTOMY (Right)  SURGEON:  Surgeon(s) and Role:    * Grace Isaac, MD - Primary  PHYSICIAN ASSISTANT: Brison Fiumara PA-C  ANESTHESIA:   general  EBL:  Total I/O In: 1000 [I.V.:1000] Out: 580 [Urine:330; Blood:250]  BLOOD ADMINISTERED:none  DRAINS: 2 Chest Tube(s) in the RIGHT HEMITHORAX   LOCAL MEDICATIONS USED:  MARCAINE     SPECIMEN:  Source of Specimen:  RUL AND LN SAMPLES  DISPOSITION OF SPECIMEN:  PATHOLOGY  COUNTS:  YES  TOURNIQUET:  * No tourniquets in log *  DICTATION: .Other Dictation: Dictation Number [id number]  PLAN OF CARE: Admit to inpatient   PATIENT DISPOSITION:  ICU - intubated and hemodynamically stable.   Delay start of Pharmacological VTE agent (>24hrs) due to surgical blood loss or risk of bleeding: yes \ COMPLICATIONS: NO KNOWN  FROZEN, ADENOCARCINOMA, NEG MARGINS

## 2017-04-20 NOTE — H&P (Signed)
[address]       [place] [address]             [place] Record #[id number] Date of Birth: [birth year]  Referring: No ref. provider found Primary Care: [name], MD  Chief Complaint:    Right Lung Mass     History of Present Illness:    Dawn Lin 76 y.o. female with right lung mass.  Follow-up after needle biopsy of Right upper lobe lung nodule was done last week.   The patient is a lifelong nonsmoker who recently presented to the [facility] in February with feeling weak flashing lights in her eyes the left eye. A CT scan of the head and neck was performed. No intracranial lesions were noted. The patient did have a right upper lobe lung nodule noted on CT of the neck. Subsequent CT of the chest was performed.  The patient although a lifelong nonsmoker does have a strong family history of COPD. She notes that she does get short of breath with exertion, denies any specific anginal chest pain. She's had no known cardiac history. She does have extensive calcification of the coronary arteries on the CT scan of the chest.  Patient has had no hemoptysis. Denies frequent pulmonary infections  Current Activity/ Functional Status:  Patient is independent with mobility/ambulation, transfers, ADL's, IADL's.   Zubrod Score: At the time of surgery this patient's most appropriate activity status/level should be described as: '[]'     0    Normal activity, no symptoms '[x]'     1    Restricted in physical strenuous activity but ambulatory, able to do out light work '[]'     2    Ambulatory and capable of self care, unable to do work activities, up and about               >50 % of waking hours                              '[]'     3    Only limited self care, in bed greater than 50% of waking hours '[]'     4    Completely disabled, no self care, confined to bed or chair '[]'     5    Moribund   Past Medical  History:  Diagnosis Date  . Anxiety   . Dyspnea    with exertion  . 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 Family history of adverse reaction to anesthesia    Children - N/V  . History of blood transfusion   . Hyperlipidemia   . Other osteoporosis   . Other thalassemia [facility])     Past Surgical History:  Procedure Laterality Date  . COLONOSCOPY    . INGUINAL HERNIA REPAIR Left   . LUMBAR DISC SURGERY  1990's   for severe pain and sciatica  . PARTIAL HYSTERECTOMY  1970's   for bleeding and prolapse    Family History:Family history is reviewed no further details are added Patient is on aware of her hours medical history, she does have 1 sister with breast cancer, has multiple sisters and aunts who had COPD or expired from COPD or smokers, patient's mother died of congestive heart failure age 103  Social  History   Social History  . Marital status: Widowed    Spouse name: N/A  . Number of children: N/A  . Years of education: N/A   Occupational History  . retired    Social History Main Topics  . Smoking status: Never Smoker  . Smokeless tobacco: Never Used  . Alcohol use No  . Drug use: No  . Sexual activity: No   Other Topics Concern  . Not on file   Social History Narrative   Dawn Lin is married. Her husband has been chronically ill for 6 years (2011). He has metastatic prostate cancer and multiple myeloma. 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 She has a lot of family support.     History  Smoking Status  . Never Smoker  Smokeless Tobacco  . Never Used    History  Alcohol Use No     No Known Allergies  Current Facility-Administered Medications  Medication Dose Route Frequency Provider Last Rate Last Dose  . cefUROXime (ZINACEF) 1.5 g in dextrose 5 % 50 mL IVPB  1.5 g Intravenous 60 min Pre-Op Grace Isaac, MD       Facility-Administered Medications Ordered in Other Encounters  Medication Dose Route Frequency  Provider Last Rate Last Dose  . fentaNYL (SUBLIMAZE) injection    Anesthesia Intra-op Merdis Delay, CRNA   25 mcg at 04/20/17 0645  . lactated ringers infusion   Intravenous Continuous PRN Merdis Delay, CRNA      . midazolam (VERSED) 5 MG/5ML injection   Intravenous Anesthesia Intra-op Merdis Delay, CRNA   0.5 mg at 04/20/17 0701      Review of Systems:     Cardiac Review of Systems: Y or N  Chest Pain [ n   ]  Resting SOB [ n  ] Exertional SOB  [  y]  Orthopnea [ n ]   Pedal Edema [ n  ]    Palpitations [ n ] Syncope  [n  ]   Presyncope [ y  ]  General Review of Systems: [Y] = yes [  ]=no Constitional: recent weight change [n  ];  Wt loss over the last 3 months [   ] anorexia [  ]; fatigue [  ]; nausea [  ]; night sweats [  ]; fever [  ]; or chills [  ];          Dental: poor dentition[  ]; Last Dentist visit:   Eye : blurred vision [ n ]; diplopia [  n ]; vision changes [n  ];  Amaurosis fugax[ n ]; Resp: cough [ y ];  wheezing[ n ];  hemoptysis[  ]; shortness of breath[  ]; paroxysmal nocturnal dyspnea[  ]; dyspnea on exertion[  ]; or orthopnea[  ];  GI:  gallstones[  ], vomiting[ n ];  dysphagia[  ]; melena[  ];  hematochezia [  ]; heartburn[  ];   Hx of  Colonoscopy[  ]; GU: kidney stones [  ]; hematuria[  ];   dysuria [  ];  nocturia[  ];  history of     obstruction [  ]; urinary frequency [  ]             Skin: rash, swelling[  ];, hair loss[  ];  peripheral edema[  ];  or itching[  ]; Musculosketetal: myalgias[  ];  joint swelling[  ];  joint erythema[  ];  joint pain[  ];  back pain[  ];  Heme/Lymph: bruising[  ];  bleeding[  ];  anemia[  ];  Neuro: TIA[ n ];  headaches[y  ];  stroke[  n];  vertigo[  ];  seizures[  ];   paresthesias[  ];  difficulty walking[  ];  Psych:depression[  ]; anxiety[  ];  Endocrine: diabetes[n  ];  thyroid dysfunction[ n ];  Immunizations: Flu up to date [ y ]; Pneumococcal up to date Blue.Reese  ];  Other:  Physical Exam: BP (!) 156/66    Pulse 62   Temp 97.6 F (36.4 C) (Oral)   Resp 18   Ht '4\' 11"'  (1.499 m)   Wt 129 lb (58.5 kg)   SpO2 100%   BMI 26.05 kg/m   PHYSICAL EXAMINATION: General appearance: alert, cooperative, appears stated age and no distress Head: normal Neck: no adenopathy, no carotid bruit, no JVD, supple, symmetrical, trachea midline and thyroid not enlarged, symmetric, no tenderness/mass/nodules Lymph nodes: No enlarged cervical or supraventricular axillary lymph nodes are present Resp: clear to auscultation bilaterally Cardio: regular rate and rhythm, S1, S2 normal, no murmur, click, rub or gallop, no rub GI: soft, non-tender; bowel sounds normal; no masses,  no organomegaly Extremities: extremities normal, atraumatic, no cyanosis or edema and Homans sign is negative, no sign of DVT Neurologic: Grossly normal   Diagnostic Studies & Laboratory data:     Recent Radiology Findings:  Nm Pet Image Initial (pi) Skull Base To Thigh  Result Date: 03/18/2017 CLINICAL DATA:  Initial treatment strategy for right upper lobe pulmonary nodule. EXAM: NUCLEAR MEDICINE PET SKULL BASE TO THIGH TECHNIQUE: 6.7 mCi F-18 FDG was injected intravenously. Full-ring PET imaging was performed from the skull base to thigh after the radiotracer. CT data was obtained and used for attenuation correction and anatomic localization. FASTING BLOOD GLUCOSE:  Value: 99 mg/dl COMPARISON:  Chest CT on 02/18/2017 FINDINGS: NECK No hypermetabolic lymph nodes in the neck. CHEST No hypermetabolic mediastinal or hilar nodes. 1.7 cm pulmonary nodule in the superior right upper lobe shows FDG uptake, with SUV max of 3.3. No other suspicious pulmonary nodules seen on CT. No evidence of pleural effusion. Aortic and coronary artery atherosclerosis noted. ABDOMEN/PELVIS No abnormal hypermetabolic activity within the liver, pancreas, adrenal glands, or spleen. No hypermetabolic lymph nodes in the abdomen or pelvis. Prior hysterectomy noted.  Aortic  atherosclerosis. SKELETON No focal hypermetabolic activity to suggest skeletal metastasis. IMPRESSION: 1.7 cm superior right upper lobe pulmonary nodule shows FDG uptake, highly suspicious for primary bronchogenic carcinoma. No evidence of thoracic nodal or distant metastatic disease. Aortic and coronary artery atherosclerosis. Electronically Signed   By: Earle Gell M.D.   On: 03/18/2017 14:42      Final Report  CLINICAL DATA: 76 year old female with history of right upper lobe pulmonary nodule. Followup study.  EXAM: CT CHEST WITH CONTRAST  TECHNIQUE: Multidetector CT imaging of the chest was performed during intravenous contrast administration.  CONTRAST: 60 mL of Isovue 370.  COMPARISON: No prior chest CT. Cervical spine CT 02/08/2017.  FINDINGS: Cardiovascular: Heart size is normal. There is no significant pericardial fluid, thickening or pericardial calcification. There is aortic atherosclerosis, as well as atherosclerosis of the great vessels of the mediastinum and the coronary arteries, including calcified atherosclerotic plaque in the left anterior descending coronary artery.  Mediastinum/Nodes: No pathologically enlarged mediastinal or hilar lymph nodes. Esophagus is unremarkable in appearance. No axillary lymphadenopathy.  Lungs/Pleura: In the periphery of the right upper lobe near the apex there is a  1.8 x 1.4 x 1.2 cm macrolobulated nodule with spiculated margins (axial image 21 of series 4 and coronal image 51 of series 8030), which is highly suspicious for primary pulmonary neoplasm. This lesion makes contact with the overlying pleura superiorly. No other definite suspicious appearing pulmonary nodules or masses. No acute consolidative airspace disease. No pleural effusions. Scattered areas of mild linear scarring are noted throughout the lung bases bilaterally.  Upper Abdomen: Diffuse low attenuation throughout the visualized hepatic parenchyma, suspicious  for hepatic steatosis (difficult to say for certain on today's contrast enhanced study). Calcified granuloma in the left lobe of the liver. Aortic atherosclerosis.  Musculoskeletal: There are no aggressive appearing lytic or blastic lesions noted in the visualized portions of the skeleton.  IMPRESSION: 1. 1.8 x 1.4 x 1.2 cm macrolobulated nodule with spiculated margins making contact with the overlying pleura in the right upper lobe, highly suspicious for primary pulmonary neoplasm. No mediastinal or hilar lymphadenopathy is noted at this time. Further evaluation with PET-CT is recommended in the near future for diagnostic and staging purposes. 2. Aortic atherosclerosis, in addition to left anterior descending coronary artery disease. Assessment for potential risk factor 3. Probable hepatic steatosis.  modification, dietary therapy or pharmacologic therapy may be warranted, if clinically indicated.These results will be called to the ordering clinician or representative by the Radiologist Assistant, and communication documented in the PACS or zVision Dashboard.   Electronically Signed By: Vinnie Langton M.D. On: 02/19/2017 09:05 Dg Chest 2 View  Result Date: 04/20/2017 CLINICAL DATA:  76 year old female with preop exam. Right lung nodule. EXAM: CHEST  2 VIEW COMPARISON:  Chest radiograph dated 04/07/2017, CT dated 04/07/2017 and PETCT dated 03/18/2017. FINDINGS: There is a 16 mm right upper lobe nodule corresponding to the nodule seen on the prior CT. The lungs are otherwise clear. There is no pleural effusion or pneumothorax. The cardiac silhouette is within normal limits. No acute osseous pathology. IMPRESSION: 1. No acute cardiopulmonary process. 2. Right upper lobe pulmonary nodule. Electronically Signed   By: Anner Crete M.D.   On: 04/20/2017 06:21   Mr Jeri Cos GL Contrast  Result Date: 04/14/2017 CLINICAL DATA:  Right upper lobe pulmonary mass.  Staging. EXAM: MRI HEAD  WITHOUT AND WITH CONTRAST TECHNIQUE: Multiplanar, multiecho pulse sequences of the brain and surrounding structures were obtained without and with intravenous contrast. CONTRAST:  27m MULTIHANCE GADOBENATE DIMEGLUMINE 529 MG/ML IV SOLN COMPARISON:  08/19/2013 FINDINGS: Brain: Diffusion imaging does not show any acute or subacute infarction. There is mild generalized brain atrophy with mild chronic small-vessel ischemic change of the mesenteric white matter. No sign of primary or metastatic mass lesion. No hemorrhage or hydrocephalus. Ventricular prominence felt secondary to central atrophy. Vascular: Major vessels at the base of the brain show flow. Skull and upper cervical spine: Negative Sinuses/Orbits: Clear/normal Other: None significant IMPRESSION: No evidence of metastatic disease. 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 Electronically Signed   By: MNelson ChimesM.D.   On: 04/14/2017 08:24     I have independently reviewed the above radiology studies  and reviewed the findings with the patient.  PFT's  FEV1 1.83 111% DLCO 12.75 72% Interpretation: The FVC, FEV1, FEV1/FVC ratio and FEF25-75% are within normal limits. . While the TLC and RV are with normal limits, the FRC is reduced. Following administration of bronchodilators, there is no significant response. The reduced diffusing capacity indicates a minimal loss of functional alveolar capillary surface. However, the diffusing capacity was not corrected  for the patient's hemoglobin. Pulmonary Function Diagnosis: Minimal Diffusion Defect   Recent Lab Findings: Lab Results  Component Value Date   WBC 6.4 04/16/2017   HGB 11.1 (L) 04/16/2017   HCT 34.7 (L) 04/16/2017   PLT 280 04/16/2017   GLUCOSE 94 04/16/2017   CHOL 222 (H) 06/10/2011   TRIG 90 06/10/2011   HDL 70 06/10/2011   LDLCALC 134 (H) 06/10/2011   ALT 9 (L) 04/16/2017   AST 23 04/16/2017   NA 140 04/16/2017   K 3.7 04/16/2017   CL 104  04/16/2017   CREATININE 0.77 04/16/2017   BUN 17 04/16/2017   CO2 28 04/16/2017   INR 0.96 04/16/2017   PATH: FINAL DIAGNOSIS Diagnosis Lung, needle/core biopsy(ies), Right Upper Lobe - ADENOCARCINOMA. Microscopic Comment There is sufficient material for ancillary testing if requested (Block 1A). Dr. Lyndon Code has reviewed the case. Dr. Julien Nordmann was paged on 04/08/2017. 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 Appears to be clinical stage I disease.  #2  calcified coronary arteries with no previous cardiac history  #3 hypercholesterolemia   I discussed with the patient and reviewed with her the needle biopsy results confirming adenocarcinoma the lung in a  lifelong nonsmoker. We discussed surgical resection versus stereotactic radiotherapy. The patient has adequate functional reserve and pulmonary function studies to tolerate surgical resection. I have recommended to her that would be the preferred treatment. We did discuss alternative therapy with stereotactic radiotherapy. Because of the patient's neurologic symptoms that originally brought to light her right lung mass we obtained an MRI  R/o  brain metastasis, though most likely this is stage I carcinoma of the lung.   Patient is willing to proceed and we'll plan for surgery on July 9, her sister and son were here with her. Cardiology cleared was obtained after see saw Dr End last week  The goals risks and alternatives of the planned surgical procedure Procedure(s): VIDEO BRONCHOSCOPY (N/A) [place] (VATS)/RIGHT UPPER  LOBECTOMY (Right)  have been discussed with the patient in detail. The risks of the procedure including death, infection, stroke, myocardial infarction, bleeding, blood transfusion have all been discussed specifically.  I have quoted Dawn Lin a 2% of perioperative mortality and a complication rate as high as 40%. The patient's questions have been answered.Dawn Lin is willing  to proceed with the planned procedure.     Grace Isaac MD      [address] [place] [address] Office [phone]   Beeper [phone]  04/20/2017 7:14 AM

## 2017-04-20 NOTE — Progress Notes (Signed)
Patient dangled at side of bed. Pt alert and oriented times 4. Patient coughed, took deep breathes and used incentive spirometer. Pt IS to 1000 x 8. Pt remains on [facility] will wean and monitor closely. MD Gerhardt to bedside for evening rounds.   Lucius Conn, RN

## 2017-04-20 NOTE — Anesthesia Procedure Notes (Addendum)
Procedure Name: Intubation Date/Time: 04/20/2017 8:03 AM Performed by: Merdis Delay Pre-anesthesia Checklist: Patient identified, Emergency Drugs available, Suction available, Patient being monitored and Timeout performed Patient Re-evaluated:Patient Re-evaluated prior to inductionOxygen Delivery Method: Circle system utilized Preoxygenation: Pre-oxygenation with 100% oxygen Intubation Type: Inhalational induction with existing ETT Laryngoscope Size: Glidescope and 4 Grade View: Grade I Tube type: Oral Endobronchial tube: Left and 35 Fr Number of attempts: 1 Airway Equipment and Method: Stylet,  Video-laryngoscopy and Fiberoptic brochoscope Placement Confirmation: ETT inserted through vocal cords under direct vision,  positive ETCO2 and breath sounds checked- equal and bilateral Secured at: 30 cm Tube secured with: Tape Dental Injury: Teeth and Oropharynx as per pre-operative assessment  Comments: Performed by Beverlee Nims huggins srna

## 2017-04-21 ENCOUNTER — Inpatient Hospital Stay (HOSPITAL_COMMUNITY): Payer: Medicare HMO

## 2017-04-21 LAB — BLOOD GAS, ARTERIAL
Acid-Base Excess: 0.9 mmol/L (ref 0.0–2.0)
Bicarbonate: 25.1 mmol/L (ref 20.0–28.0)
Drawn by: 426241
O2 Content: 2 L/min
O2 Saturation: 95.3 %
Patient temperature: 98.6
pCO2 arterial: 40.9 mmHg (ref 32.0–48.0)
pH, Arterial: 7.405 (ref 7.350–7.450)
pO2, Arterial: 76.1 mmHg — ABNORMAL LOW (ref 83.0–108.0)

## 2017-04-21 LAB — CBC
HCT: 27.6 % — ABNORMAL LOW (ref 36.0–46.0)
Hemoglobin: 8.9 g/dL — ABNORMAL LOW (ref 12.0–15.0)
MCH: 20.5 pg — ABNORMAL LOW (ref 26.0–34.0)
MCHC: 32.2 g/dL (ref 30.0–36.0)
MCV: 63.6 fL — ABNORMAL LOW (ref 78.0–100.0)
Platelets: 203 10*3/uL (ref 150–400)
RBC: 4.34 MIL/uL (ref 3.87–5.11)
RDW: 16.4 % — ABNORMAL HIGH (ref 11.5–15.5)
WBC: 12.7 10*3/uL — ABNORMAL HIGH (ref 4.0–10.5)

## 2017-04-21 LAB — GLUCOSE, CAPILLARY
Glucose-Capillary: 169 mg/dL — ABNORMAL HIGH (ref 65–99)
Glucose-Capillary: 126 mg/dL — ABNORMAL HIGH (ref 65–99)

## 2017-04-21 LAB — BASIC METABOLIC PANEL
Anion gap: 8 (ref 5–15)
BUN: 10 mg/dL (ref 6–20)
CO2: 23 mmol/L (ref 22–32)
Calcium: 8.3 mg/dL — ABNORMAL LOW (ref 8.9–10.3)
Chloride: 105 mmol/L (ref 101–111)
Creatinine, Ser: 0.74 mg/dL (ref 0.44–1.00)
GFR calc Af Amer: >60 mL/min (ref >60)
GFR calc non Af Amer: >60 mL/min (ref >60)
Glucose, Bld: 179 mg/dL — ABNORMAL HIGH (ref 65–99)
Potassium: 3.2 mmol/L — ABNORMAL LOW (ref 3.5–5.1)
Sodium: 136 mmol/L (ref 135–145)

## 2017-04-21 MED ORDER — ALBUTEROL SULFATE (2.5 MG/3ML) 0.083% IN NEBU: 2.5000 mg | INHALATION_SOLUTION | RESPIRATORY_TRACT | Status: DC | PRN

## 2017-04-21 MED ORDER — POTASSIUM CHLORIDE 10 MEQ/50ML IV SOLN
10.0000 meq | INTRAVENOUS | Status: AC
  Administered 2017-04-21 (×3): 10 meq via INTRAVENOUS
  Filled 2017-04-21 (×3): qty 50

## 2017-04-21 NOTE — Op Note (Signed)
NAME:  Dawn Lin, Dawn Lin NO.:  MEDICAL RECORD NO.:  [id number]  LOCATION:                                 FACILITY:  PHYSICIAN:  Lanelle Bal, MD         DATE OF BIRTH:  DATE OF PROCEDURE:  04/20/2017 DATE OF DISCHARGE:                              OPERATIVE REPORT   POSTOPERATIVE DIAGNOSIS:  Right upper lobe adenocarcinoma of the lung.  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  SURGICAL PROCEDURE:  Video bronchoscopy, right video-assisted thoracoscopy, right upper lobectomy with lymph node dissection, and placement of On-Q.  SURGEON:  Lanelle Bal, MD  FIRST ASSISTANT:  John Giovanni, PA-C  BRIEF HISTORY:  The patient is a 76 year old female, who presented in March 2018 to [facility] with flashes of light in her left eye. Further evaluation of this incidentally found a right upper lobe lung nodule.  This was further evaluated with a CT scan of the chest and ultimately a PET scan.  The patient was then referred to Thoracic Surgery.  Needle biopsy of the right upper lobe mass was performed that confirmed adenocarcinoma.  In addition, because of the patient's symptoms of flashing light in her left eye which had not recurred, we did obtain an MRI of the brain that showed no evidence of malignancy. Risks and options of surgical treatment versus stereotactic radiotherapy were discussed with the patient, which clinically appeared to be a stage I adenocarcinoma of the lung.  The patient is a lifelong nonsmoker.  She agreed to proceed with surgical resection, which had been recommended. Preop clearance was obtained.  DESCRIPTION OF PROCEDURE:  The patient with central line and arterial line in place underwent general endotracheal anesthesia with a single- lumen endotracheal tube.  Appropriate time-out was performed, and we proceeded with a 2.8-mm video bronchoscope and exam of the tracheobronchial tree to the  subsegmental level, both the right and left tracheobronchial tree without evidence of endobronchial lesions.  The scope was removed.  A double-lumen endotracheal tube was placed.  The patient was turned in the lateral decubitus position with the right side up.  The right side had preoperatively been marked.  The right chest was prepped with Betadine and draped in usual sterile manner.  A second time- out was performed and we proceeded with right video-assisted thoracoscopy.  Approximately the 4th intercostal space, a small port incision was made and the chest entered.  The left lung was partially inflated, but there were no significant adhesions.  We then made 2 additional port incisions, one more anterior and one more posteriorly and enlarged the initial incision via our working port site.  We then proceeded first with isolating the right superior pulmonary vein.  This was carefully dissected around and a vascular stapler was used to divide the vein.  Adhesions and tissue along the hiatus and hemiazygos vein were dissected free and the pulmonary artery branch to the right upper lobe was identified.  Again, this was carefully dissected around and in a similar fashion the vascular stapler was used to divide the vessel.  A second more posterior arterial branch was also divided.  This  gave access to the right upper lobe bronchus, which was encircled.  A green load Ethicon powered stapler was then placed across the right upper lobe bronchus.  We test inflated the lung and middle and lower lobe inflated well.  The bronchus was divided.  We then proceeded with dividing the major and minor fissures.  The major fissure was fairly complete, the minor fissure less so.  We divided specimen and it was placed in a specimen bag and brought out through the incision.  In addition to the nodes in the main specimen, we dissected nodes above the hemiazygos and labeled R2, also R4, R10/11 nodes.  We then  divided the inferior pulmonary ligament to some degree to free up the lower lobe.  The bronchus and cut edges of the lung were tested for air leak.  We then tacked the middle lobe to the lower lobe to prevent torsion.  An On-Q device was tunneled subpleurally along the posterior ribs.  Two 28 chest tubes were left in place, a Blake drain posteriorly and a standard chest tube anteriorly.  The lung reinflated well.  The incision was then closed with interrupted 0 Vicryl on the deep layers and a running 2-0 Vicryl on subcutaneous tissue and a 3-0 subcuticular stitch on skin edges.  Sponge and needle count was reported as correct at completion of procedure.  The patient tolerated the procedure without obvious complication.  She was extubated in the operating room and transferred to the recovery room for postoperative care.  Estimated blood loss was approximately 100 mL.     Lanelle Bal, MD     EG/MEDQ  D:  04/21/2017  T:  04/21/2017  Job:  [id number]

## 2017-04-21 NOTE — Care Management Note (Signed)
Case Management Note Marvetta Gibbons RN, BSN Unit 2W-Case Manager-- [place] coverage [phone]  Patient Details  Name: KATALENA MALVEAUX MRN: [id number] Date of Birth: Dec 22, 1940  Subjective/Objective:    Pt admitted s/p Video bronchoscopy, right video-assisted thoracoscopy, right upper lobectomy with lymph node dissection, and placement of On-Q.  On 04/20/17                Action/Plan: PTA pt lived at home independent- CM to follow for d/c needs  Expected Discharge Date:                  Expected Discharge Plan:  [place]  In-House Referral:     Discharge planning Services  CM Consult  Post Acute Care Choice:    Choice offered to:     DME Arranged:    DME Agency:     HH Arranged:    HH Agency:     Status of Service:  In process, will continue to follow  If discussed at Long Length of Stay Meetings, dates discussed:    Discharge Disposition:   Additional Comments:  Dawayne Patricia, RN 04/21/2017, 8:58 AM

## 2017-04-21 NOTE — Progress Notes (Signed)
Patient ID: Dawn Lin, female   DOB: September 19, 1941, 76 y.o.   MRN: [id number] TCTS DAILY ICU PROGRESS NOTE                   [address]            [place] [address]          [phone]   1 Day Post-Op Procedure(s) (LRB): VIDEO BRONCHOSCOPY (N/A) VIDEO ASSISTED THORACOSCOPY (VATS)/RIGHT UPPER LOBECTOMY (Right)  Total Length of Stay:  LOS: 1 day   Subjective: Awake and alert, stood this am  Objective: Vital signs in last 24 hours: Temp:  [96.9 F (36.1 C)-99.9 F (37.7 C)] 99.9 F (37.7 C) (07/10 0400) Pulse Rate:  [72-90] 80 (07/10 0600) Cardiac Rhythm: Normal sinus rhythm (07/10 0000) Resp:  [14-34] 18 (07/10 0600) BP: (96-141)/(36-61) 125/61 (07/10 0600) SpO2:  [90 %-100 %] 97 % (07/10 0600) Arterial Line BP: (99-169)/(42-66) 146/59 (07/10 0600)  Filed Weights   04/20/17 0617  Weight: 129 lb (58.5 kg)    Weight change:    Hemodynamic parameters for last 24 hours:    Intake/Output from previous day: 07/09 0701 - 07/10 0700 In: 2640 [P.O.:50; I.V.:2540; IV Piggyback:50] Out: 2290 [BPZWC:5852; Blood:250; Chest Tube:245]  Intake/Output this shift: No intake/output data recorded.  Current Meds: Scheduled Meds: . acetaminophen  1,000 mg Oral Q6H   Or  . acetaminophen (TYLENOL) oral liquid 160 mg/5 mL  1,000 mg Oral Q6H  . albuterol  2.5 mg Nebulization Q4H while awake  . amLODipine  10 mg Oral Daily  . aspirin EC  81 mg Oral Daily  . bisacodyl  10 mg Oral Daily  . escitalopram  10 mg Oral Daily  . fentaNYL   Intravenous Q4H  . hydrochlorothiazide  25 mg Oral Daily  . pantoprazole  40 mg Oral Daily  . senna-docusate  1 tablet Oral QHS   Continuous Infusions: . cefUROXime (ZINACEF)  IV Stopped (04/21/17 0050)  . dextrose 5 % and 0.9% NaCl 100 mL/hr at 04/21/17 0220  . potassium chloride    . potassium chloride 10 mEq (04/21/17 0655)   PRN Meds:.diphenhydrAMINE **OR** diphenhydrAMINE, naloxone **AND** sodium chloride flush,  ondansetron (ZOFRAN) IV, ondansetron (ZOFRAN) IV, potassium chloride, traMADol  General appearance: alert and cooperative Neurologic: intact Heart: regular rate and rhythm, S1, S2 normal, no murmur, click, rub or gallop Lungs: diminished breath sounds bibasilar Abdomen: soft, non-tender; bowel sounds normal; no masses,  no organomegaly Extremities: extremities normal, atraumatic, no cyanosis or edema and Homans sign is negative, no sign of DVT Wound: very small air leak with cough  Lab Results: CBC: Recent Labs  04/21/17 0348  WBC 12.7*  HGB 8.9*  HCT 27.6*  PLT 203   BMET:  Recent Labs  04/21/17 0348  NA 136  K 3.2*  CL 105  CO2 23  GLUCOSE 179*  BUN 10  CREATININE 0.74  CALCIUM 8.3*    CMET: Lab Results  Component Value Date   WBC 12.7 (H) 04/21/2017   HGB 8.9 (L) 04/21/2017   HCT 27.6 (L) 04/21/2017   PLT 203 04/21/2017   GLUCOSE 179 (H) 04/21/2017   CHOL 222 (H) 06/10/2011   TRIG 90 06/10/2011   HDL 70 06/10/2011   LDLCALC 134 (H) 06/10/2011   ALT 9 (L) 04/16/2017   AST 23 04/16/2017   NA 136 04/21/2017   K 3.2 (L) 04/21/2017   CL 105 04/21/2017   CREATININE 0.74 04/21/2017   BUN  10 04/21/2017   CO2 23 04/21/2017   INR 0.96 04/16/2017      PT/INR: No results for input(s): LABPROT, INR in the last 72 hours. Radiology: Dg Chest Port 1 View  Result Date: 04/20/2017 CLINICAL DATA:  Central line placement and RIGHT chest tube. Status post VATS/RIGHT upper lobectomy. EXAM: PORTABLE CHEST 1 VIEW COMPARISON:  Chest radiograph April 20, 2017 at 0506 hours. FINDINGS: RIGHT internal jugular central venous catheter distal tip projects an RIGHT atrium. Two RIGHT chest tubes, side port projecting within the chest wall. Linear density at location of RIGHT apical nodule. No pneumothorax. Low inspiratory examination with crowded vascular markings, bibasilar strandy densities. Cardiomediastinal silhouette is normal. No pneumothorax. RIGHT chest wall subcutaneous gas.  Osseous structures are nonsuspicious. IMPRESSION: RIGHT internal jugular central venous catheter tip projects in RIGHT atrium, recommend 2 cm retraction. 2 RIGHT chest tubes, no pneumothorax. Low inspiratory examination with bibasilar atelectasis. Interval resection of RIGHT apical nodule. Electronically Signed   By: Elon Alas M.D.   On: 04/20/2017 14:14     Assessment/Plan: S/P Procedure(s) (LRB): VIDEO BRONCHOSCOPY (N/A) VIDEO ASSISTED THORACOSCOPY (VATS)/RIGHT UPPER LOBECTOMY (Right) Mobilize Diuresis See progression orders Expected Acute  Blood - loss Anemia Ct to water seal    Grace Isaac 04/21/2017 7:25 AM

## 2017-04-21 NOTE — Progress Notes (Signed)
TCTS BRIEF SICU PROGRESS NOTE  1 Day Post-Op  S/P Procedure(s) (LRB): VIDEO BRONCHOSCOPY (N/A) VIDEO ASSISTED THORACOSCOPY (VATS)/RIGHT UPPER LOBECTOMY (Right)   Stable day  Plan: Continue current plan  Rexene Alberts, MD 04/21/2017 7:14 PM

## 2017-04-22 ENCOUNTER — Inpatient Hospital Stay (HOSPITAL_COMMUNITY): Payer: Medicare HMO

## 2017-04-22 LAB — GLUCOSE, CAPILLARY
Glucose-Capillary: 122 mg/dL — ABNORMAL HIGH (ref 65–99)
Glucose-Capillary: 133 mg/dL — ABNORMAL HIGH (ref 65–99)

## 2017-04-22 LAB — CBC
HCT: 25.9 % — ABNORMAL LOW (ref 36.0–46.0)
Hemoglobin: 8.3 g/dL — ABNORMAL LOW (ref 12.0–15.0)
MCH: 20.5 pg — ABNORMAL LOW (ref 26.0–34.0)
MCHC: 32 g/dL (ref 30.0–36.0)
MCV: 64.1 fL — ABNORMAL LOW (ref 78.0–100.0)
Platelets: 187 10*3/uL (ref 150–400)
RBC: 4.04 MIL/uL (ref 3.87–5.11)
RDW: 16.8 % — ABNORMAL HIGH (ref 11.5–15.5)
WBC: 13.9 10*3/uL — ABNORMAL HIGH (ref 4.0–10.5)

## 2017-04-22 LAB — COMPREHENSIVE METABOLIC PANEL
ALT: 9 U/L — ABNORMAL LOW (ref 14–54)
AST: 27 U/L (ref 15–41)
Albumin: 3 g/dL — ABNORMAL LOW (ref 3.5–5.0)
Alkaline Phosphatase: 43 U/L (ref 38–126)
Anion gap: 4 — ABNORMAL LOW (ref 5–15)
BUN: 5 mg/dL — ABNORMAL LOW (ref 6–20)
CO2: 29 mmol/L (ref 22–32)
Calcium: 8.3 mg/dL — ABNORMAL LOW (ref 8.9–10.3)
Chloride: 106 mmol/L (ref 101–111)
Creatinine, Ser: 0.53 mg/dL (ref 0.44–1.00)
GFR calc Af Amer: >60 mL/min (ref >60)
GFR calc non Af Amer: >60 mL/min (ref >60)
Glucose, Bld: 116 mg/dL — ABNORMAL HIGH (ref 65–99)
Potassium: 2.9 mmol/L — ABNORMAL LOW (ref 3.5–5.1)
Sodium: 139 mmol/L (ref 135–145)
Total Bilirubin: 1 mg/dL (ref 0.3–1.2)
Total Protein: 5.2 g/dL — ABNORMAL LOW (ref 6.5–8.1)

## 2017-04-22 MED ORDER — POTASSIUM CHLORIDE 10 MEQ/50ML IV SOLN
10.0000 meq | INTRAVENOUS | Status: AC
  Administered 2017-04-22 (×3): 10 meq via INTRAVENOUS
  Filled 2017-04-22 (×3): qty 50

## 2017-04-22 MED ORDER — LIDOCAINE HCL (PF) 1 % IJ SOLN
INTRAMUSCULAR | Status: AC
  Administered 2017-04-22: 5 mL
  Filled 2017-04-22: qty 5

## 2017-04-22 MED ORDER — SODIUM CHLORIDE 0.9% FLUSH
10.0000 mL | INTRAVENOUS | Status: DC | PRN
  Administered 2017-04-26: 10 mL
  Filled 2017-04-22: qty 40

## 2017-04-22 MED ORDER — CHLORHEXIDINE GLUCONATE CLOTH 2 % EX PADS
6.0000 | MEDICATED_PAD | Freq: Every day | CUTANEOUS | Status: DC
  Administered 2017-04-22 – 2017-04-25 (×4): 6 via TOPICAL

## 2017-04-22 MED ORDER — SODIUM CHLORIDE 0.9% FLUSH
10.0000 mL | Freq: Two times a day (BID) | INTRAVENOUS | Status: DC
  Administered 2017-04-22 – 2017-04-24 (×4): 10 mL
  Administered 2017-04-25: 20 mL
  Administered 2017-04-25: 10 mL
  Administered 2017-04-29: 3 mL
  Administered 2017-04-29 – 2017-05-01 (×2): 10 mL

## 2017-04-22 NOTE — Progress Notes (Signed)
Patient ID: Dawn Lin, female   DOB: [birth year], 76 y.o.   MRN: [id number] TCTS DAILY ICU PROGRESS NOTE                   [address]            [place] [address]          [phone]   2 Days Post-Op Procedure(s) (LRB): VIDEO BRONCHOSCOPY (N/A) VIDEO ASSISTED THORACOSCOPY (VATS)/RIGHT UPPER LOBECTOMY (Right)  Total Length of Stay:  LOS: 2 days   Subjective: Patient awake and alert, slow moving around   Objective: Vital signs in last 24 hours: Temp:  [98.4 F (36.9 C)-98.7 F (37.1 C)] 98.6 F (37 C) (07/11 0000) Pulse Rate:  [69-85] 76 (07/11 0600) Cardiac Rhythm: Normal sinus rhythm (07/11 0400) Resp:  [12-97] 97 (07/11 0800) BP: (123-153)/(51-65) 142/63 (07/11 0600) SpO2:  [94 %-99 %] 97 % (07/11 0800) Weight:  [138 lb 8 oz (62.8 kg)] 138 lb 8 oz (62.8 kg) (07/11 0600)  Filed Weights   04/20/17 0617 04/22/17 0600  Weight: 129 lb (58.5 kg) 138 lb 8 oz (62.8 kg)    Weight change:    Hemodynamic parameters for last 24 hours:    Intake/Output from previous day: 07/10 0701 - 07/11 0700 In: 1830 [P.O.:480; I.V.:1150; IV Piggyback:200] Out: 1281 [Urine:1091; Chest Tube:190]  Intake/Output this shift: No intake/output data recorded.  Current Meds: Scheduled Meds: . acetaminophen  1,000 mg Oral Q6H   Or  . acetaminophen (TYLENOL) oral liquid 160 mg/5 mL  1,000 mg Oral Q6H  . amLODipine  10 mg Oral Daily  . aspirin EC  81 mg Oral Daily  . bisacodyl  10 mg Oral Daily  . escitalopram  10 mg Oral Daily  . fentaNYL   Intravenous Q4H  . hydrochlorothiazide  25 mg Oral Daily  . pantoprazole  40 mg Oral Daily  . senna-docusate  1 tablet Oral QHS   Continuous Infusions: . dextrose 5 % and 0.9% NaCl 50 mL/hr at 04/21/17 1900  . potassium chloride    . potassium chloride 10 mEq (04/22/17 0809)   PRN Meds:.albuterol, diphenhydrAMINE **OR** diphenhydrAMINE, naloxone **AND** sodium chloride flush, ondansetron (ZOFRAN) IV, potassium chloride,  traMADol  General appearance: alert and cooperative Neurologic: intact Heart: regular rate and rhythm, S1, S2 normal, no murmur, click, rub or gallop Lungs: diminished breath sounds bibasilar Abdomen: soft, non-tender; bowel sounds normal; no masses,  no organomegaly Extremities: extremities normal, atraumatic, no cyanosis or edema and Homans sign is negative, no sign of DVT Wound: very small air leak with cough, small amt sub q air   Lab Results: CBC:  Recent Labs  04/21/17 0348 04/22/17 0350  WBC 12.7* 13.9*  HGB 8.9* 8.3*  HCT 27.6* 25.9*  PLT 203 187   BMET:   Recent Labs  04/21/17 0348 04/22/17 0350  NA 136 139  K 3.2* 2.9*  CL 105 106  CO2 23 29  GLUCOSE 179* 116*  BUN 10 5*  CREATININE 0.74 0.53  CALCIUM 8.3* 8.3*    CMET: Lab Results  Component Value Date   WBC 13.9 (H) 04/22/2017   HGB 8.3 (L) 04/22/2017   HCT 25.9 (L) 04/22/2017   PLT 187 04/22/2017   GLUCOSE 116 (H) 04/22/2017   CHOL 222 (H) 06/10/2011   TRIG 90 06/10/2011   HDL 70 06/10/2011   LDLCALC 134 (H) 06/10/2011   ALT 9 (L) 04/22/2017   AST 27 04/22/2017   NA  139 04/22/2017   K 2.9 (L) 04/22/2017   CL 106 04/22/2017   CREATININE 0.53 04/22/2017   BUN 5 (L) 04/22/2017   CO2 29 04/22/2017   INR 0.96 04/16/2017      PT/INR: No results for input(s): LABPROT, INR in the last 72 hours. Radiology: No results found.  Cancer Staging Malignant neoplasm of right upper lobe of lung ([place]) Staging form: Lung, AJCC 8th Edition - Pathologic stage from 04/21/2017: Stage IA2 (pT1b, pN0, cM0) - Signed by Grace Isaac, MD on 04/22/2017 - Pathologic: No stage assigned - Unsigned    Assessment/Plan: S/P Procedure(s) (LRB): VIDEO BRONCHOSCOPY (N/A) VIDEO ASSISTED THORACOSCOPY (VATS)/RIGHT UPPER LOBECTOMY (Right) Mobilize Diuresis Ct to suction , Pt consult  Stage reviewed with patient and family    Grace Isaac 04/22/2017 8:21 AM

## 2017-04-22 NOTE — Progress Notes (Signed)
Patient ID: Dawn Lin, female   DOB: 02-Sep-1941, 76 y.o.   MRN: [id number] EVENING ROUNDS NOTE :     [address]       [place] [address]             [phone]                 2 Days Post-Op Procedure(s) (LRB): VIDEO BRONCHOSCOPY (N/A) VIDEO ASSISTED THORACOSCOPY (VATS)/RIGHT UPPER LOBECTOMY (Right)  Total Length of Stay:  LOS: 2 days  BP (!) 161/53   Pulse 84   Temp 98.6 F (37 C) (Oral)   Resp 20   Ht 4\' 11"  (1.499 m)   Wt 138 lb 8 oz (62.8 kg)   SpO2 93%   BMI 27.97 kg/m   .Intake/Output      07/11 0701 - 07/12 0700   P.O. 480   I.V. (mL/kg) 600 (9.6)   IV Piggyback 50   Total Intake(mL/kg) 1130 (18)   Urine (mL/kg/hr) 1100 (1.4)   Stool 0 (0)   Chest Tube 130 (0.2)   Total Output 1230   Net -100       Urine Occurrence 1 x   Stool Occurrence 2 x     . dextrose 5 % and 0.9% NaCl 50 mL/hr at 04/22/17 1900  . potassium chloride       Lab Results  Component Value Date   WBC 13.9 (H) 04/22/2017   HGB 8.3 (L) 04/22/2017   HCT 25.9 (L) 04/22/2017   PLT 187 04/22/2017   GLUCOSE 116 (H) 04/22/2017   CHOL 222 (H) 06/10/2011   TRIG 90 06/10/2011   HDL 70 06/10/2011   LDLCALC 134 (H) 06/10/2011   ALT 9 (L) 04/22/2017   AST 27 04/22/2017   NA 139 04/22/2017   K 2.9 (L) 04/22/2017   CL 106 04/22/2017   CREATININE 0.53 04/22/2017   BUN 5 (L) 04/22/2017   CO2 29 04/22/2017   INR 0.96 04/16/2017   Stable day, still slow moving around   Grace Isaac MD  Beeper [phone] Office [phone] 04/22/2017 7:42 PM

## 2017-04-23 ENCOUNTER — Encounter (HOSPITAL_COMMUNITY): Payer: Self-pay | Admitting: Cardiothoracic Surgery

## 2017-04-23 ENCOUNTER — Inpatient Hospital Stay (HOSPITAL_COMMUNITY): Payer: Medicare HMO

## 2017-04-23 LAB — CBC
HCT: 26.9 % — ABNORMAL LOW (ref 36.0–46.0)
Hemoglobin: 8.6 g/dL — ABNORMAL LOW (ref 12.0–15.0)
MCH: 20.3 pg — ABNORMAL LOW (ref 26.0–34.0)
MCHC: 32 g/dL (ref 30.0–36.0)
MCV: 63.6 fL — ABNORMAL LOW (ref 78.0–100.0)
Platelets: 164 10*3/uL (ref 150–400)
RBC: 4.23 MIL/uL (ref 3.87–5.11)
RDW: 16.5 % — ABNORMAL HIGH (ref 11.5–15.5)
WBC: 11.5 10*3/uL — ABNORMAL HIGH (ref 4.0–10.5)

## 2017-04-23 LAB — BASIC METABOLIC PANEL
Anion gap: 6 (ref 5–15)
BUN: 5 mg/dL — ABNORMAL LOW (ref 6–20)
CO2: 30 mmol/L (ref 22–32)
Calcium: 8.3 mg/dL — ABNORMAL LOW (ref 8.9–10.3)
Chloride: 102 mmol/L (ref 101–111)
Creatinine, Ser: 0.51 mg/dL (ref 0.44–1.00)
GFR calc Af Amer: >60 mL/min (ref >60)
GFR calc non Af Amer: >60 mL/min (ref >60)
Glucose, Bld: 110 mg/dL — ABNORMAL HIGH (ref 65–99)
Potassium: 2.8 mmol/L — ABNORMAL LOW (ref 3.5–5.1)
Sodium: 138 mmol/L (ref 135–145)

## 2017-04-23 MED ORDER — POTASSIUM CHLORIDE 10 MEQ/50ML IV SOLN
10.0000 meq | INTRAVENOUS | Status: AC
  Administered 2017-04-23 (×3): 10 meq via INTRAVENOUS
  Filled 2017-04-23 (×3): qty 50

## 2017-04-23 MED ORDER — POTASSIUM CHLORIDE CRYS ER 20 MEQ PO TBCR
20.0000 meq | EXTENDED_RELEASE_TABLET | Freq: Every day | ORAL | Status: DC
Start: 2017-04-24 — End: 2017-04-24

## 2017-04-23 NOTE — Progress Notes (Signed)
On-Q d/c'd per protocol. Insertion site clean, dry and intact.

## 2017-04-23 NOTE — NC FL2 (Signed)
[place] LEVEL OF CARE SCREENING TOOL     IDENTIFICATION  Patient Name: Dawn Lin Birthdate: 10/31/1940 Sex: female Admission Date (Current Location): 04/20/2017  [facility] and [place] Number:  Engineering geologist and Address:  [facility], [place] [address] [place], [place] [address]      Provider Number: [id number]  Attending Physician Name and Address:  Grace Isaac, MD  Relative Name and Phone Number:       Current Level of Care: Hospital Recommended Level of Care: [place] Prior Approval Number:    Date Approved/Denied:   PASRR Number: [id number]  Discharge Plan: SNF    Current Diagnoses: Patient Active Problem List   Diagnosis Date Noted  . S/P lobectomy of lung 04/20/2017  . Malignant neoplasm of right upper lobe of lung ([place]) 04/13/2017  . Low back pain 08/12/2011  . Sciatica 07/28/2011  . Nail fungus 07/28/2011  . Preventative health care 06/10/2011  . Grief 04/08/2011  . Hyperlipidemia 11/26/2010  . BACK PAIN 09/03/2010  . Other thalassemia ([place]) 11/27/2009  . Essential hypertension, benign 11/27/2009  . NECK PAIN 11/27/2009  . Other osteoporosis 11/27/2009    Orientation RESPIRATION BLADDER Height & Weight     Self, Time, Situation, Place  Normal Incontinent, External catheter Weight: 133 lb 13.1 oz (60.7 kg) Height:  4\' 11"  (149.9 cm)  BEHAVIORAL SYMPTOMS/MOOD NEUROLOGICAL BOWEL NUTRITION STATUS      Continent Diet (cardiac)  AMBULATORY STATUS COMMUNICATION OF NEEDS Skin   Limited Assist Verbally Surgical wounds                       Personal Care Assistance Level of Assistance  Bathing, Dressing Bathing Assistance: Limited assistance   Dressing Assistance: Limited assistance     Functional Limitations Info             SPECIAL CARE FACTORS FREQUENCY  PT (By licensed PT), OT (By licensed OT)     PT Frequency: 5/wk OT Frequency: 5/wk             Contractures      Additional Factors Info  Code Status, Allergies Code Status Info: FULL Allergies Info: NKA           Current Medications (04/23/2017):  This is the current hospital active medication list Current Facility-Administered Medications  Medication Dose Route Frequency Provider Last Rate Last Dose  . acetaminophen (TYLENOL) tablet 1,000 mg  1,000 mg Oral Q6H Gold, Wayne E, PA-C   1,000 mg at 04/23/17 1046   Or  . acetaminophen (TYLENOL) solution 1,000 mg  1,000 mg Oral Q6H Gold, Wayne E, PA-C      . albuterol (PROVENTIL) (2.5 MG/3ML) 0.083% nebulizer solution 2.5 mg  2.5 mg Nebulization Q4H PRN Gold, Wayne E, PA-C      . amLODipine (NORVASC) tablet 10 mg  10 mg Oral Daily Gold, Wayne E, PA-C   10 mg at 04/23/17 0933  . aspirin EC tablet 81 mg  81 mg Oral Daily Gold, Wayne E, PA-C   81 mg at 04/23/17 3295  . bisacodyl (DULCOLAX) EC tablet 10 mg  10 mg Oral Daily Gold, Wayne E, PA-C   10 mg at 04/22/17 1022  . Chlorhexidine Gluconate Cloth 2 % PADS 6 each  6 each Topical Daily Grace Isaac, MD   6 each at 04/23/17 1000  . dextrose 5 %-0.9 % sodium chloride infusion   Intravenous Continuous Lanelle Bal  B, MD 10 mL/hr at 04/23/17 0933    . escitalopram (LEXAPRO) tablet 10 mg  10 mg Oral Daily Gold, Wayne E, PA-C   10 mg at 04/23/17 0933  . hydrochlorothiazide (HYDRODIURIL) tablet 25 mg  25 mg Oral Daily Gold, Wayne E, PA-C   25 mg at 04/23/17 0933  . pantoprazole (PROTONIX) EC tablet 40 mg  40 mg Oral Daily Gold, Wayne E, PA-C   40 mg at 04/23/17 0933  . potassium chloride 10 mEq in 50 mL *CENTRAL LINE* IVPB  10 mEq Intravenous Daily PRN Gold, Wayne E, PA-C      . [START ON 04/24/2017] potassium chloride SA (K-DUR,KLOR-CON) CR tablet 20 mEq  20 mEq Oral Daily Grace Isaac, MD      . senna-docusate (Senokot-S) tablet 1 tablet  1 tablet Oral QHS Jadene Pierini E, PA-C   1 tablet at 04/21/17 2207  . sodium chloride flush (NS) 0.9 % injection 10-40 mL  10-40 mL  Intracatheter Q12H Grace Isaac, MD   10 mL at 04/22/17 2228  . sodium chloride flush (NS) 0.9 % injection 10-40 mL  10-40 mL Intracatheter PRN Grace Isaac, MD      . traMADol Veatrice Bourbon) tablet 50-100 mg  50-100 mg Oral Q6H PRN John Giovanni, PA-C         Discharge Medications: Please see discharge summary for a list of discharge medications.  Relevant Imaging Results:  Relevant Lab Results:   Additional Information SS#: [id number]  Jorge Ny, LCSW

## 2017-04-23 NOTE — Plan of Care (Signed)
Problem: Pain Managment: Goal: General experience of comfort will improve Outcome: Progressing PCA pump discontinued today per MD orders. Pt has done well transitioning off of it. Scheduled pain medication given as ordered and prior to any activity with good result.   Problem: Activity: Goal: Risk for activity intolerance will decrease Outcome: Progressing Pt ambulation much improved from yesterday: 1 assist with walker & ambulating to the bathroom.

## 2017-04-23 NOTE — Progress Notes (Signed)
PCA pump d/c'd per order. ~15cc of Fent from PCA syringe wasted in the sink with San Jetty RN, and Maddie Himes RN.

## 2017-04-23 NOTE — Evaluation (Signed)
Physical Therapy Evaluation Patient Details Name: Dawn Lin MRN: [id number] DOB: [birth year] Today's Date: 04/23/2017   History of Present Illness  Patient is a 76 y/o female who presents with right lung mass s/p VATS and right upper lobectomy. Chest CT- small left PTX. s/p chest tube placement. PMH includes HLD, HTN, anxiety.  Clinical Impression  Patient presents with dyspnea on exertion, pain, generalized weakness and impaired mobility s/p above. Tolerated gait training with min guard assist for balance/safety. Needs assist with standing from surfaces. Encouraged ambulation 2 more times today. VSS throughout mobility. Pt is independent PTA and lives alone but has the option to stay with son if needed. Because pt was so highly independent and active PTA, might benefit from ST SNF to maximize independence and mobility prior to return home so pt can return to PLOF.     Follow Up Recommendations SNF;Supervision for mobility/OOB    Equipment Recommendations  Rolling walker with 5" wheels    Recommendations for Other Services OT consult     Precautions / Restrictions Precautions Precautions: Fall Precaution Comments: chest tube to water seal Restrictions Weight Bearing Restrictions: No      Mobility  Bed Mobility               General bed mobility comments: Up in chair upon PT arrival.   Transfers Overall transfer level: Needs assistance Equipment used: Rolling walker (2 wheeled) Transfers: Sit to/from Stand Sit to Stand: Min assist         General transfer comment: Assist to power to standing using body momentum. Stood from [employer], from toilet x1.   Ambulation/Gait Ambulation/Gait assistance: Min guard Ambulation Distance (Feet): 400 Feet Assistive device: Rolling walker (2 wheeled) Gait Pattern/deviations: Decreased stride length;Step-through pattern;Shuffle;Narrow base of support Gait velocity: decreased Gait velocity interpretation: <1.8 ft/sec,  indicative of risk for recurrent falls General Gait Details: SLow, shuffling like gait pattern with 2/4 DOE. 2 standing rest breaks. HR stable in 90s throughout and SP02 stable >95%. Cues for turns using RW.   Stairs            Wheelchair Mobility    Modified Rankin (Stroke Patients Only)       Balance Overall balance assessment: Needs assistance Sitting-balance support: Feet supported;No upper extremity supported Sitting balance-Leahy Scale: Good Sitting balance - Comments: Able to perform pericare without difficulty reaching outside BoS.   Standing balance support: During functional activity;Bilateral upper extremity supported Standing balance-Leahy Scale: Poor Standing balance comment: Reliant on BUEs for support in standing.                             Pertinent Vitals/Pain Pain Assessment: Faces Faces Pain Scale: Hurts little more Pain Location: chest tube area Pain Descriptors / Indicators: Sore Pain Intervention(s): Monitored during session;Repositioned;Premedicated before session    Home Living Family/patient expects to be discharged to:: Private residence Living Arrangements: Alone Available Help at Discharge: Family;Available 24 hours/day (if needed- sister or son) Type of Home: House Home Access: Ramped entrance     Home Layout: One level Home Equipment: None      Prior Function Level of Independence: Independent         Comments: Works in garden, drives, cooks, Microbiologist.      Hand Dominance        Extremity/Trunk Assessment   Upper Extremity Assessment Upper Extremity Assessment: Defer to OT evaluation    Lower Extremity Assessment Lower Extremity Assessment: Generalized  weakness       Communication   Communication: No difficulties  Cognition Arousal/Alertness: Awake/alert Behavior During Therapy: WFL for tasks assessed/performed Overall Cognitive Status: Within Functional Limits for tasks assessed                                         General Comments      Exercises     Assessment/Plan    PT Assessment Patient needs continued PT services  PT Problem List Decreased strength;Decreased mobility;Cardiopulmonary status limiting activity;Pain;Decreased balance;Decreased activity tolerance;Decreased knowledge of use of DME       PT Treatment Interventions Therapeutic activities;Therapeutic exercise;Gait training;Patient/family education;Balance training;Functional mobility training    PT Goals (Current goals can be found in the Care Plan section)  Acute Rehab PT Goals Patient Stated Goal: to go on a trip in Sept PT Goal Formulation: With patient Time For Goal Achievement: 05/07/17 Potential to Achieve Goals: Good    Frequency Min 3X/week   Barriers to discharge Decreased caregiver support however can stay with son if needed.    Co-evaluation               AM-PAC PT "6 Clicks" Daily Activity  Outcome Measure Difficulty turning over in bed (including adjusting bedclothes, sheets and blankets)?: None Difficulty moving from lying on back to sitting on the side of the bed? : Total Difficulty sitting down on and standing up from a chair with arms (e.g., wheelchair, bedside commode, etc,.)?: Total Help needed moving to and from a bed to chair (including a wheelchair)?: A Little Help needed walking in hospital room?: A Little Help needed climbing 3-5 steps with a railing? : A Lot 6 Click Score: 14    End of Session   Activity Tolerance: Patient tolerated treatment well Patient left: in chair;with call bell/phone within reach Nurse Communication: Mobility status PT Visit Diagnosis: Pain;Unsteadiness on feet (R26.81) Pain - part of body:  (rib cage)    Time: 1040-1104 PT Time Calculation (min) (ACUTE ONLY): 24 min   Charges:   PT Evaluation $PT Eval Moderate Complexity: 1 Procedure PT Treatments $Gait Training: 8-22 mins   PT G Codes:        Wray Kearns,  PT, DPT [phone]    Marguarite Arbour A Emauri Krygier 04/23/2017, 11:36 AM

## 2017-04-23 NOTE — Clinical Social Work Note (Signed)
Clinical Social Work Assessment  Patient Details  Name: Dawn Lin MRN: [id number] Date of Birth: 08-08-1941  Date of referral:  04/23/17               Reason for consult:  Facility Placement                Permission sought to share information with:  Facility Sport and exercise psychologist, Family Supports Permission granted to share information::  Yes, Verbal Permission Granted  Name::        Agency::  SNF  Relationship::  sister and brother in law at bedside  Contact Information:     Housing/Transportation Living arrangements for the past 2 months:  [place] of Information:  Patient Patient Interpreter Needed:  None Criminal Activity/Legal Involvement Pertinent to Current Situation/Hospitalization:  No - Comment as needed Significant Relationships:  Other Family Members, Siblings, Adult Children Lives with:  Self Do you feel safe going back to the place where you live?  No Need for family participation in patient care:  No (Coment)  Care giving concerns:  Pt lives at home alone- current with issues with transferring independently and concerned about managing at home alone.   Social Worker assessment / plan:  CSW spoke with pt concerning PT recommendation for SNF. PT acknowledges feeling weaker than normal and would be concerned about returning home alone at this time.    CSW and pt discussed SNF and SNF referral process.  CSW also discussed insurance prior approval and possibility of pt not being approved for rehab based on current condition.  Pt was already aware of this possibility and will stay with her son and dtr in law temporarily if she cannot go to rehab.   Employment status:  Retired Nurse, adult PT Recommendations:  [place] / Referral to community resources:  [place]  Patient/Family's Response to care:  Pt very realistic about current needs and available support  agreeable to SNF search but also to return home if needed.  Patient/Family's Understanding of and Emotional Response to Diagnosis, Current Treatment, and Prognosis:  Pt has good understanding of current condition and needs- hopeful she will improve enough in the hospital not to require further inpatient treatment/rehab.  Emotional Assessment Appearance:  Appears stated age Attitude/Demeanor/Rapport:    Affect (typically observed):  Appropriate, Accepting Orientation:  Oriented to Self, Oriented to Place, Oriented to  Time, Oriented to Situation Alcohol / Substance use:  Not Applicable Psych involvement (Current and /or in the community):  No (Comment)  Discharge Needs  Concerns to be addressed:  Care Coordination Readmission within the last 30 days:  No Current discharge risk:  Physical Impairment Barriers to Discharge:  Continued Medical Work up   Jorge Ny, LCSW 04/23/2017, 3:16 PM

## 2017-04-23 NOTE — Progress Notes (Signed)
      [address]       [place] [address]             [phone]      POD # 3 right upper lobectomy  Comfortable  BP 135/61 (BP Location: Right Arm)   Pulse 74   Temp 98.1 F (36.7 C) (Oral)   Resp 19   Ht 4\' 11"  (1.499 m)   Wt 133 lb 13.1 oz (60.7 kg)   SpO2 95%   BMI 27.03 kg/m    Intake/Output Summary (Last 24 hours) at 04/23/17 1825 Last data filed at 04/23/17 1730  Gross per 24 hour  Intake             1392 ml  Output             2380 ml  Net             -988 ml    Continue present care  Remo Lipps C. Roxan Hockey, MD [facility] [phone]

## 2017-04-23 NOTE — Progress Notes (Signed)
Patient ID: Dawn Lin, female   DOB: 1941/08/25, 76 y.o.   MRN: [id number] TCTS DAILY ICU PROGRESS NOTE                   [address]            [place] [address]          [phone]   3 Days Post-Op Procedure(s) (LRB): VIDEO BRONCHOSCOPY (N/A) VIDEO ASSISTED THORACOSCOPY (VATS)/RIGHT UPPER LOBECTOMY (Right)  Total Length of Stay:  LOS: 3 days   Subjective: Feels better walked in hall this am  Objective: Vital signs in last 24 hours: Temp:  [97.8 F (36.6 C)-99.1 F (37.3 C)] 97.8 F (36.6 C) (07/12 0700) Pulse Rate:  [61-87] 71 (07/12 0700) Cardiac Rhythm: Normal sinus rhythm (07/12 0400) Resp:  [14-30] 18 (07/12 0800) BP: (126-165)/(53-88) 152/79 (07/12 0700) SpO2:  [93 %-99 %] 98 % (07/12 0800) Weight:  [133 lb 13.1 oz (60.7 kg)] 133 lb 13.1 oz (60.7 kg) (07/12 0600)  Filed Weights   04/20/17 0617 04/22/17 0600 04/23/17 0600  Weight: 129 lb (58.5 kg) 138 lb 8 oz (62.8 kg) 133 lb 13.1 oz (60.7 kg)    Weight change: -4 lb 10.9 oz (-2.123 kg)   Hemodynamic parameters for last 24 hours:    Intake/Output from previous day: 07/11 0701 - 07/12 0700 In: 2070 [P.O.:720; I.V.:1200; IV Piggyback:150] Out: 2970 [Urine:2650; Chest Tube:320]  Intake/Output this shift: No intake/output data recorded.  Current Meds: Scheduled Meds: . acetaminophen  1,000 mg Oral Q6H   Or  . acetaminophen (TYLENOL) oral liquid 160 mg/5 mL  1,000 mg Oral Q6H  . amLODipine  10 mg Oral Daily  . aspirin EC  81 mg Oral Daily  . bisacodyl  10 mg Oral Daily  . Chlorhexidine Gluconate Cloth  6 each Topical Daily  . escitalopram  10 mg Oral Daily  . fentaNYL   Intravenous Q4H  . hydrochlorothiazide  25 mg Oral Daily  . pantoprazole  40 mg Oral Daily  . senna-docusate  1 tablet Oral QHS  . sodium chloride flush  10-40 mL Intracatheter Q12H   Continuous Infusions: . dextrose 5 % and 0.9% NaCl 50 mL/hr at 04/22/17 2000  . potassium chloride    . potassium  chloride 10 mEq (04/23/17 0758)   PRN Meds:.albuterol, diphenhydrAMINE **OR** diphenhydrAMINE, naloxone **AND** sodium chloride flush, ondansetron (ZOFRAN) IV, potassium chloride, sodium chloride flush, traMADol  General appearance: alert and cooperative Neurologic: intact Heart: regular rate and rhythm, S1, S2 normal, no murmur, click, rub or gallop Lungs: diminished breath sounds bibasilar Abdomen: soft, non-tender; bowel sounds normal; no masses,  no organomegaly Extremities: extremities normal, atraumatic, no cyanosis or edema and Homans sign is negative, no sign of DVT Wound: no air leak today, still sub q air limited amount no more then yesterday  Lab Results: CBC:  Recent Labs  04/22/17 0350 04/23/17 0409  WBC 13.9* 11.5*  HGB 8.3* 8.6*  HCT 25.9* 26.9*  PLT 187 164   BMET:   Recent Labs  04/22/17 0350 04/23/17 0409  NA 139 138  K 2.9* 2.8*  CL 106 102  CO2 29 30  GLUCOSE 116* 110*  BUN 5* 5*  CREATININE 0.53 0.51  CALCIUM 8.3* 8.3*    CMET: Lab Results  Component Value Date   WBC 11.5 (H) 04/23/2017   HGB 8.6 (L) 04/23/2017   HCT 26.9 (L) 04/23/2017   PLT 164 04/23/2017   GLUCOSE 110 (H)  04/23/2017   CHOL 222 (H) 06/10/2011   TRIG 90 06/10/2011   HDL 70 06/10/2011   LDLCALC 134 (H) 06/10/2011   ALT 9 (L) 04/22/2017   AST 27 04/22/2017   NA 138 04/23/2017   K 2.8 (L) 04/23/2017   CL 102 04/23/2017   CREATININE 0.51 04/23/2017   BUN 5 (L) 04/23/2017   CO2 30 04/23/2017   INR 0.96 04/16/2017      PT/INR: No results for input(s): LABPROT, INR in the last 72 hours. Radiology: No results found. No report on chest xray yet, no pneumo on todays film   Assessment/Plan: S/P Procedure(s) (LRB): VIDEO BRONCHOSCOPY (N/A) VIDEO ASSISTED THORACOSCOPY (VATS)/RIGHT UPPER LOBECTOMY (Right) Mobilize Diuresis Replace k for hypokalemia  Ct to water seal Change to po pain meds d/c pca pump  Cancer Staging Malignant neoplasm of right upper lobe of lung  ([place]) Staging form: Lung, AJCC 8th Edition - Pathologic stage from 04/21/2017: Stage IA2 (pT1b, pN0, cM0) - Signed by Grace Isaac, MD on 04/22/2017   Grace Isaac 04/23/2017 8:06 AM

## 2017-04-24 ENCOUNTER — Inpatient Hospital Stay (HOSPITAL_COMMUNITY): Payer: Medicare HMO

## 2017-04-24 LAB — BASIC METABOLIC PANEL
Anion gap: 5 (ref 5–15)
BUN: 9 mg/dL (ref 6–20)
CO2: 29 mmol/L (ref 22–32)
Calcium: 8.2 mg/dL — ABNORMAL LOW (ref 8.9–10.3)
Chloride: 105 mmol/L (ref 101–111)
Creatinine, Ser: 0.57 mg/dL (ref 0.44–1.00)
GFR calc Af Amer: >60 mL/min (ref >60)
GFR calc non Af Amer: >60 mL/min (ref >60)
Glucose, Bld: 102 mg/dL — ABNORMAL HIGH (ref 65–99)
Potassium: 2.9 mmol/L — ABNORMAL LOW (ref 3.5–5.1)
Sodium: 139 mmol/L (ref 135–145)

## 2017-04-24 LAB — CBC
HCT: 26.2 % — ABNORMAL LOW (ref 36.0–46.0)
Hemoglobin: 8.2 g/dL — ABNORMAL LOW (ref 12.0–15.0)
MCH: 20.6 pg — ABNORMAL LOW (ref 26.0–34.0)
MCHC: 31.3 g/dL (ref 30.0–36.0)
MCV: 65.7 fL — ABNORMAL LOW (ref 78.0–100.0)
Platelets: 197 10*3/uL (ref 150–400)
RBC: 3.99 MIL/uL (ref 3.87–5.11)
RDW: 16.6 % — ABNORMAL HIGH (ref 11.5–15.5)
WBC: 9.3 10*3/uL (ref 4.0–10.5)

## 2017-04-24 MED ORDER — POTASSIUM CHLORIDE CRYS ER 20 MEQ PO TBCR
20.0000 meq | EXTENDED_RELEASE_TABLET | Freq: Three times a day (TID) | ORAL | Status: DC
Start: 2017-04-24 — End: 2017-04-25
  Administered 2017-04-24 (×3): 20 meq via ORAL
  Filled 2017-04-24 (×3): qty 1

## 2017-04-24 NOTE — Discharge Summary (Signed)
Physician Discharge Summary  Patient ID: IA LEEB MRN: [id number] DOB/AGE: 05/26/1941 76 y.o.  Admit date: 04/20/2017 Discharge date: 05/04/2017  Admission Diagnoses:  Patient Active Problem List   Diagnosis Date Noted  . Malignant neoplasm of right upper lobe of lung ([place]) 04/13/2017  . Low back pain 08/12/2011  . Sciatica 07/28/2011  . Nail fungus 07/28/2011  . Preventative health care 06/10/2011  . Grief 04/08/2011  . Hyperlipidemia 11/26/2010  . BACK PAIN 09/03/2010  . Other thalassemia ([place]) 11/27/2009  . Essential hypertension, benign 11/27/2009  . NECK PAIN 11/27/2009  . Other osteoporosis 11/27/2009   Discharge Diagnoses:   Patient Active Problem List   Diagnosis Date Noted  . S/P lobectomy of lung 04/20/2017  . Malignant neoplasm of right upper lobe of lung ([place]) 04/13/2017  . Low back pain 08/12/2011  . Sciatica 07/28/2011  . Nail fungus 07/28/2011  . Preventative health care 06/10/2011  . Grief 04/08/2011  . Hyperlipidemia 11/26/2010  . BACK PAIN 09/03/2010  . Other thalassemia ([place]) 11/27/2009  . Essential hypertension, benign 11/27/2009  . NECK PAIN 11/27/2009  . Other osteoporosis 11/27/2009   Discharged Condition: good  History of Present Illness:  Dawn Lin is a 76 yo female who is a lifelong non-smoker.  The patient presented to [facility] in February with complaints of feeling weak with flashing lights in her eyes.  CT scan of the head and neck was performed which was negative.  She was incidentally noted to have a nodule in the right upper lobe of her lung.  Due to that CT scan of the chest was obtained and confirmed the presence of a right upper lung nodule.  She was also noted to have extensive coronary calcification.  She denied frequent pulmonary symptoms, weight loss, and hemoptysis.  PET CT scan found the lesion to be hypermetabolic.  Lung Biopsy was performed and the lesion was identified as Adenocarcinoma.  She was  referred to Dr. Servando Snare for lung resection.  He was in agreement lung resection would be indicated.  The risks and benefits of the procedure were explained to the patient and he was agreeable to proceed.   Hospital Course:   She presented to [facility] on 04/20/2017.  She underwent Video Bronchoscopy, right VATS with right upper lobectomy with lymph node dissection.  She tolerated the procedure without difficulty, was extubated and taken to the SICU in stable condition.  Her chest tube was free from air leak and transitioned to water seal on POD #2.  She was found to be hypokalemic and her potassium level was supplemented.  She was deconditioned and evaluated by PT who recommended SNF placement.  Her CXR has remained stable without pneumothorax.  Her chest tube was removed on 04/24/2017.  Follow up CXR was free from pneumothorax, but there was development of subcutaneous emphysema.  There was no evidence of air leak.  The patient continued to do well, but unexpectedly developed fever and leukocytosis.  Her surgical incisions were all healing well.  A UA was obtained and was negative.  She was treated with empiric Zosyn.  Her pleural fluid ultimately turned purulent and was sent for gram stain and culture.  Results revealed MRSA.  She was treated with Vancomycin for this and Infectious disease consult was obtained.  They recommended continued Vancomycin and 2 weeks of Doxycycline post hospital discharge.  Her chest tube output turned serous and her chest tube was removed on POD # 11.  Follow up CXR showed  stable appearance of sub q emphysema.  No pneumothorax.   She continued to do well.  She remains afebrile and her leukocytosis as resolved.  She is ambulating without difficulty and felt medically stable for discharge today.     Significant Diagnostic Studies: nuclear medicine:   1.7 cm superior right upper lobe pulmonary nodule shows FDG uptake, highly suspicious for primary bronchogenic  carcinoma.  No evidence of thoracic nodal or distant metastatic disease.  Aortic and coronary artery atherosclerosis.  Treatments: surgery:   Video bronchoscopy, right video-assisted thoracoscopy, right upper lobectomy with lymph node dissection, and placement of On-Q.  Disposition: 01-Home or Self Care   Discharge medications:   Allergies as of 05/04/2017   No Known Allergies     Medication List    TAKE these medications   acetaminophen 325 MG tablet Commonly known as:  TYLENOL Take 2 tablets (650 mg total) by mouth every 6 (six) hours as needed for mild pain.   amLODipine 10 MG tablet Commonly known as:  NORVASC Take 10 mg by mouth daily.   aspirin EC 81 MG tablet Take 81 mg by mouth daily.   benzonatate 200 MG capsule Commonly known as:  TESSALON Take 200 mg by mouth 3 (three) times daily as needed for cough.   Calcium-Phosphorus-Vitamin D 250-115-250 MG-MG-UNIT Chew Chew 1 each by mouth 2 (two) times daily.   doxycycline 100 MG capsule Commonly known as:  VIBRAMYCIN Take 1 capsule (100 mg total) by mouth 2 (two) times daily. For 14 days   hydrochlorothiazide 25 MG tablet Commonly known as:  HYDRODIURIL Take 1 tablet (25 mg total) by mouth daily.   Krill Oil 350 MG Caps Take 1 capsule by mouth daily.   LEXAPRO 10 MG tablet Generic drug:  escitalopram Take 10 mg by mouth daily.   multivitamin with minerals Tabs tablet Take 1 tablet by mouth daily.   traMADol 50 MG tablet Commonly known as:  ULTRAM Take 1-2 tablets (50-100 mg total) by mouth every 6 (six) hours as needed (mild pain).            Durable Medical Equipment        Start     Ordered   05/04/17 1053  For home use only DME Shower stool  Once    Comments:  Plan for d/c to home later today.   05/04/17 1053      Contact information for follow-up providers    Grace Isaac, MD Follow up on 05/14/2017.   Specialty:  Cardiothoracic Surgery Why:  Appoointment is at 2:30, please  get CXR at 2:00 at [facility] located on first floor of our office building Contact information: [address] [place] [address] [phone]                      Signed: Ellwood Handler 05/04/2017, 12:09 PM

## 2017-04-24 NOTE — Discharge Instructions (Signed)
Thoracotomy, Care After This sheet gives you information about how to care for yourself after your procedure. Your health care provider may also give you more specific instructions. If you have problems or questions, contact your health care provider. What can I expect after the procedure? After your procedure, it is common to have:  Pain and swelling around the incision area.  Pain when you breathe in (inhale).  Constipation.  Fatigue.  Loss of appetite.  Trouble sleeping.  Mood swings and depression.  Follow these instructions at home: Preventing pneumonia  Take deep breaths or do breathing exercises as instructed by your health care provider.  Cough frequently. Coughing may cause discomfort, but it is important to clear mucus (phlegm) and expand your lungs. If coughing hurts, hold a pillow against your chest or place both hands flat on top of the incision (splinting) when you cough. This may help relieve discomfort.  Continue to use an incentive spirometer as directed. This is a tool that measures how well you fill your lungs with each breath.  Participate in pulmonary rehabilitation as directed. This is a program that combines education, exercise, and support from a team of specialists. The goal is to help you heal and return to normal activities as soon as possible. Medicines  Take over-the-counter or prescription medicines only as told by your health care provider.  If you have pain, take pain-relieving medicine before your pain becomes severe. This is important because if your pain is under control, you will be able to breathe and cough more comfortably.  If you were prescribed an antibiotic medicine, take it as told by your health care provider. Do not stop taking the antibiotic even if you start to feel better. Activity  Ask your health care provider what activities are safe for you.  Do not travel by airplane for 2 weeks after your chest tube is removed, or until  your health care provider says that this is safe.  Do not lift anything that is heavier than 10 lb (4.5 kg), or the limit that your health care provider tells you, until he or she says that it is safe.  Do not drive until your health care provider approves. ? Do not drive or use heavy machinery while taking prescription pain medicine. Incision care  Follow instructions from your health care provider about how to take care of your incision. Make sure you: ? Wash your hands with soap and water before you change your bandage (dressing). If soap and water are not available, use hand sanitizer. ? Change your dressing as told by your health care provider. ? Leave stitches (sutures), skin glue, or adhesive strips in place. These skin closures may need to stay in place for 2 weeks or longer. If adhesive strip edges start to loosen and curl up, you may trim the loose edges. Do not remove adhesive strips completely unless your health care provider tells you to do that.  Keep your dressing dry.  Check your incision area every day for signs of infection. Check for: ? More redness, swelling, or pain. ? More fluid or blood. ? Warmth. ? Pus or a bad smell. Bathing  Do not take baths, swim, or use a hot tub until your health care provider approves. You may take showers.  After your dressing has been removed, use soap and water to gently wash your incision area. Do not use anything else to clean your incision unless your health care provider tells you to do that. Eating and  drinking  Eat a healthy diet as instructed by your health care provider. A healthy diet includes plenty of fresh fruits and vegetables, whole grains, and low-fat (lean) proteins.  Drink enough fluid to keep your urine clear or pale yellow. General instructions  To prevent or treat constipation while you are taking prescription pain medicine, your health care provider may recommend that you: ? Take over-the-counter or prescription  medicines. ? Eat foods that are high in fiber, such as fresh fruits and vegetables, whole grains, and beans. ? Limit foods that are high in fat and processed sugars, such as fried and sweet foods.  Do not use any products that contain nicotine or tobacco, such as cigarettes and e-cigarettes. If you need help quitting, ask your health care provider.  Avoid secondhand smoke.  Wear compression stockings as told by your health care provider. These stockings help to prevent blood clots and reduce swelling in your legs.  If you have a chest tube, care for it as instructed.  Keep all follow-up visits as told by your health care provider. This is important. Contact a health care provider if:  You have more redness, swelling, or pain around your incision.  You have more fluid or blood coming from your incision.  Your incision feels warm to the touch.  You have pus or a bad smell coming from your incision.  You have a fever or chills.  Your heartbeat seems irregular.  You have nausea or vomiting.  You have muscle aches.  You are constipated. This may mean that you have: ? Fewer bowel movements in a week than normal. ? Difficulty having a bowel movement. ? Stools that are dry, hard, or larger than normal. Get help right away if:  You develop a rash.  You feel light-headed or feel like you are going to faint.  You have shortness of breath or trouble breathing.  You are confused.  You have trouble speaking.  You have vision problems.  You are not able to move.  You have numbness in your face, arms, or legs.  You lose consciousness.  You have a sudden, severe headache.  You feel weak.  You have chest pain.  You have pain that: ? Is severe. ? Gets worse, even with medicine. Summary  To prevent pneumonia, take deep breaths, do breathing exercises, and cough frequently, as instructed by your health care provider.  Do not drive until your health care provider  approves. Do not travel by airplane for 2 weeks after your chest tube is removed, or until your health care provider approves.  Check your incision area every day for signs of infection.  Eat a healthy diet that includes plenty of fresh fruits and vegetables, whole grains, and low-fat (lean) proteins. This information is not intended to replace advice given to you by your health care provider. Make sure you discuss any questions you have with your health care provider. Document Released: 03/14/2011 Document Revised: 06/23/2016 Document Reviewed: 06/23/2016 Elsevier Interactive Patient Education  2017 [employer].

## 2017-04-24 NOTE — Care Management Note (Signed)
Case Management Note  Patient Details  Name: Dawn Lin MRN: [id number] Date of Birth: 26-Jul-1941  Subjective/Objective:     Pt admitted s/p Video bronchoscopy, right video-assistedthoracoscopy, right upper lobectomy with lymph node dissection and placement of ON-Q on 04/20/17.    7/13 1054 Dawn Bamberger RN, BSN - two chest tubes ,will dc one today, has sub q emphysema from leak, will probably stay one more day in ICU.  plan is for SNF, but if can not go to SNF she will stay with her son  and daughter n law.                          Action/Plan: NCM will follow for dc needs.   Expected Discharge Date:                  Expected Discharge Plan:  [place]  In-House Referral:     Discharge planning Services  CM Consult  Post Acute Care Choice:    Choice offered to:     DME Arranged:    DME Agency:     HH Arranged:    [place] Agency:     Status of Service:  In process, will continue to follow  If discussed at Long Length of Stay Meetings, dates discussed:    Additional Comments:  Zenon Mayo, RN 04/24/2017, 10:50 AM

## 2017-04-24 NOTE — Progress Notes (Signed)
CSW provided pt with bed offers- no choice at this time  CSW will continue to follow  Jorge Ny, [place] Social Worker [phone]

## 2017-04-24 NOTE — Progress Notes (Signed)
Patient ID: Dawn Lin, female   DOB: 1941/08/01, 76 y.o.   MRN: [id number] TCTS DAILY ICU PROGRESS NOTE                   [address]            ,[place] [address]          [phone]   4 Days Post-Op Procedure(s) (LRB): VIDEO BRONCHOSCOPY (N/A) VIDEO ASSISTED THORACOSCOPY (VATS)/RIGHT UPPER LOBECTOMY (Right)  Total Length of Stay:  LOS: 4 days   Subjective: Feels ok today, one chest tube out this am  Objective: Vital signs in last 24 hours: Temp:  [97.9 F (36.6 C)-98.6 F (37 C)] 98.5 F (36.9 C) (07/13 0355) Pulse Rate:  [68-82] 68 (07/13 0600) Cardiac Rhythm: Normal sinus rhythm (07/12 2000) Resp:  [15-25] 17 (07/13 0600) BP: (121-147)/(42-79) 123/42 (07/13 0600) SpO2:  [90 %-99 %] 93 % (07/13 0600)  Filed Weights   04/20/17 0617 04/22/17 0600 04/23/17 0600  Weight: 129 lb (58.5 kg) 138 lb 8 oz (62.8 kg) 133 lb 13.1 oz (60.7 kg)    Weight change:    Hemodynamic parameters for last 24 hours:    Intake/Output from previous day: 07/12 0701 - 07/13 0700 In: 532 [I.V.:332; IV Piggyback:200] Out: 1390 [Urine:1300; Chest Tube:90]  Intake/Output this shift: No intake/output data recorded.  Current Meds: Scheduled Meds: . acetaminophen  1,000 mg Oral Q6H   Or  . acetaminophen (TYLENOL) oral liquid 160 mg/5 mL  1,000 mg Oral Q6H  . amLODipine  10 mg Oral Daily  . aspirin EC  81 mg Oral Daily  . bisacodyl  10 mg Oral Daily  . Chlorhexidine Gluconate Cloth  6 each Topical Daily  . escitalopram  10 mg Oral Daily  . hydrochlorothiazide  25 mg Oral Daily  . pantoprazole  40 mg Oral Daily  . potassium chloride  20 mEq Oral Daily  . senna-docusate  1 tablet Oral QHS  . sodium chloride flush  10-40 mL Intracatheter Q12H   Continuous Infusions: . dextrose 5 % and 0.9% NaCl 10 mL/hr at 04/24/17 0600  . potassium chloride 10 mEq (04/24/17 0723)   PRN Meds:.albuterol, potassium chloride, sodium chloride flush, traMADol  General  appearance: alert and cooperative Neurologic: intact Heart: regular rate and rhythm, S1, S2 normal, no murmur, click, rub or gallop Lungs: diminished breath sounds RLL Abdomen: soft, non-tender; bowel sounds normal; no masses,  no organomegaly Extremities: extremities normal, atraumatic, no cyanosis or edema and Homans sign is negative, no sign of DVT Wound: no air leak   Lab Results: CBC: Recent Labs  04/23/17 0409 04/24/17 0340  WBC 11.5* 9.3  HGB 8.6* 8.2*  HCT 26.9* 26.2*  PLT 164 197   BMET:  Recent Labs  04/23/17 0409 04/24/17 0340  NA 138 139  K 2.8* 2.9*  CL 102 105  CO2 30 29  GLUCOSE 110* 102*  BUN 5* 9  CREATININE 0.51 0.57  CALCIUM 8.3* 8.2*    CMET: Lab Results  Component Value Date   WBC 9.3 04/24/2017   HGB 8.2 (L) 04/24/2017   HCT 26.2 (L) 04/24/2017   PLT 197 04/24/2017   GLUCOSE 102 (H) 04/24/2017   CHOL 222 (H) 06/10/2011   TRIG 90 06/10/2011   HDL 70 06/10/2011   LDLCALC 134 (H) 06/10/2011   ALT 9 (L) 04/22/2017   AST 27 04/22/2017   NA 139 04/24/2017   K 2.9 (L) 04/24/2017   CL  105 04/24/2017   CREATININE 0.57 04/24/2017   BUN 9 04/24/2017   CO2 29 04/24/2017   INR 0.96 04/16/2017      PT/INR: No results for input(s): LABPROT, INR in the last 72 hours. Radiology: No results found.   Assessment/Plan: S/P Procedure(s) (LRB): VIDEO BRONCHOSCOPY (N/A) VIDEO ASSISTED THORACOSCOPY (VATS)/RIGHT UPPER LOBECTOMY (Right) Mobilize d/c tubes/lines One chest tube out Continue to replace KCL  To step down     Grace Isaac 04/24/2017 7:41 AM

## 2017-04-24 NOTE — Progress Notes (Signed)
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 Chest CT- small left PTX. s/p chest tube placement. PMH includes HLD, HTN, anxiety.    PT Comments    Patient is progressing well toward mobility goals. Pt with 1/4 DOE with ambulation. VSS throughout session. Pt required assistance for transfers. Continue to progress as tolerated.    Follow Up Recommendations  SNF;Supervision for mobility/OOB     Equipment Recommendations  Rolling walker with 5" wheels    Recommendations for Other Services OT consult     Precautions / Restrictions Precautions Precautions: Fall Precaution Comments: chest tube to water seal    Mobility  Bed Mobility               General bed mobility comments: Up in chair upon arrival.   Transfers Overall transfer level: Needs assistance Equipment used: Rolling walker (2 wheeled) Transfers: Sit to/from Stand Sit to Stand: Min assist         General transfer comment: assist to power up into standing and to descend due to poor eccentric load  Ambulation/Gait Ambulation/Gait assistance: Min guard Ambulation Distance (Feet): 400 Feet Assistive device: Rolling walker (2 wheeled) Gait Pattern/deviations: Step-through pattern;Decreased stride length;Narrow base of support Gait velocity: decreased   General Gait Details: shuffling gait initially; pt with improved stride length with cues; pt with 1/4 DOE   Stairs            Wheelchair Mobility    Modified Rankin (Stroke Patients Only)       Balance   Sitting-balance support: Feet supported;No upper extremity supported Sitting balance-Leahy Scale: Good     Standing balance support: During functional activity;Bilateral upper extremity supported Standing balance-Leahy Scale: Poor                               Cognition Arousal/Alertness: Awake/alert Behavior During Therapy: WFL for tasks assessed/performed Overall Cognitive Status: Within Functional Limits for tasks assessed                                        Exercises      General Comments General comments (skin integrity, edema, etc.): VSS      Pertinent Vitals/Pain Pain Assessment: Faces Faces Pain Scale: Hurts little more Pain Location: chest tube area Pain Descriptors / Indicators: Sore Pain Intervention(s): Monitored during session    Home Living                      Prior Function            PT Goals (current goals can now be found in the care plan section) Progress towards PT goals: Progressing toward goals    Frequency    Min 3X/week      PT Plan Current plan remains appropriate    Co-evaluation              AM-PAC PT "6 Clicks" Daily Activity  Outcome Measure  Difficulty turning over in bed (including adjusting bedclothes, sheets and blankets)?: None Difficulty moving from lying on back to sitting on the side of the bed? : Total Difficulty sitting down on and standing up from  a chair with arms (e.g., wheelchair, bedside commode, etc,.)?: Total Help needed moving to and from a bed to chair (including a wheelchair)?: A Little Help needed walking in hospital room?: A Little Help needed climbing 3-5 steps with a railing? : A Little 6 Click Score: 15    End of Session   Activity Tolerance: Patient tolerated treatment well Patient left: in chair;with call bell/phone within reach;with nursing/sitter in room;with family/visitor present Nurse Communication: Mobility status PT Visit Diagnosis: Pain;Unsteadiness on feet (R26.81) Pain - part of body:  (rib cage)     Time: 6553-7482 PT Time Calculation (min) (ACUTE ONLY): 23 min  Charges:  $Gait Training: 8-22 mins $Therapeutic Activity: 8-22 mins                    G Codes:        Earney Navy, PTA Pager: [phone]     Darliss Cheney 04/24/2017, 4:31 PM

## 2017-04-24 NOTE — Progress Notes (Signed)
TCTS BRIEF SICU PROGRESS NOTE  4 Days Post-Op  S/P Procedure(s) (LRB): VIDEO BRONCHOSCOPY (N/A) VIDEO ASSISTED THORACOSCOPY (VATS)/RIGHT UPPER LOBECTOMY (Right)   Stable day  Plan: Awaiting bed on step down unit for transfer  Rexene Alberts, MD 04/24/2017 7:32 PM

## 2017-04-25 ENCOUNTER — Inpatient Hospital Stay (HOSPITAL_COMMUNITY): Payer: Medicare HMO

## 2017-04-25 LAB — BASIC METABOLIC PANEL
Anion gap: 5 (ref 5–15)
BUN: 10 mg/dL (ref 6–20)
CO2: 28 mmol/L (ref 22–32)
Calcium: 8.4 mg/dL — ABNORMAL LOW (ref 8.9–10.3)
Chloride: 103 mmol/L (ref 101–111)
Creatinine, Ser: 0.6 mg/dL (ref 0.44–1.00)
GFR calc Af Amer: >60 mL/min (ref >60)
GFR calc non Af Amer: >60 mL/min (ref >60)
Glucose, Bld: 103 mg/dL — ABNORMAL HIGH (ref 65–99)
Potassium: 3.8 mmol/L (ref 3.5–5.1)
Sodium: 136 mmol/L (ref 135–145)

## 2017-04-25 LAB — MAGNESIUM: Magnesium: 1.7 mg/dL (ref 1.7–2.4)

## 2017-04-25 MED ORDER — ACETAMINOPHEN 325 MG PO TABS
650.0000 mg | ORAL_TABLET | Freq: Four times a day (QID) | ORAL | Status: DC | PRN
  Administered 2017-04-25 – 2017-05-02 (×4): 650 mg via ORAL
  Filled 2017-04-25 (×4): qty 2

## 2017-04-25 MED ORDER — POTASSIUM CHLORIDE CRYS ER 20 MEQ PO TBCR
20.0000 meq | EXTENDED_RELEASE_TABLET | Freq: Every day | ORAL | Status: DC
  Administered 2017-04-25 – 2017-04-27 (×3): 20 meq via ORAL
  Filled 2017-04-25 (×3): qty 1

## 2017-04-25 NOTE — Progress Notes (Signed)
Patient ID: STOREY STANGELAND, female   DOB: Feb 19, 1941, 76 y.o.   MRN: [id number] TCTS DAILY ICU PROGRESS NOTE                   [address]            [place] [address]          [phone]   5 Days Post-Op Procedure(s) (LRB): VIDEO BRONCHOSCOPY (N/A) VIDEO ASSISTED THORACOSCOPY (VATS)/RIGHT UPPER LOBECTOMY (Right)  Total Length of Stay:  LOS: 5 days   Subjective: Up to chair   Objective: Vital signs in last 24 hours: Temp:  [97.7 F (36.5 C)-99.2 F (37.3 C)] 99.2 F (37.3 C) (07/14 0813) Pulse Rate:  [66-88] 88 (07/14 0800) Cardiac Rhythm: Normal sinus rhythm (07/14 0800) Resp:  [14-25] 22 (07/14 0800) BP: (107-137)/(43-111) 129/57 (07/14 0800) SpO2:  [91 %-97 %] 96 % (07/14 0800)  Filed Weights   04/20/17 0617 04/22/17 0600 04/23/17 0600  Weight: 129 lb (58.5 kg) 138 lb 8 oz (62.8 kg) 133 lb 13.1 oz (60.7 kg)    Weight change:    Hemodynamic parameters for last 24 hours:    Intake/Output from previous day: 07/13 0701 - 07/14 0700 In: 730 [P.O.:480; I.V.:250] Out: 2170 [Urine:1950; Chest Tube:220]  Intake/Output this shift: No intake/output data recorded.  Current Meds: Scheduled Meds: . amLODipine  10 mg Oral Daily  . aspirin EC  81 mg Oral Daily  . bisacodyl  10 mg Oral Daily  . Chlorhexidine Gluconate Cloth  6 each Topical Daily  . escitalopram  10 mg Oral Daily  . hydrochlorothiazide  25 mg Oral Daily  . pantoprazole  40 mg Oral Daily  . potassium chloride  20 mEq Oral TID  . senna-docusate  1 tablet Oral QHS  . sodium chloride flush  10-40 mL Intracatheter Q12H   Continuous Infusions: . dextrose 5 % and 0.9% NaCl Stopped (04/25/17 0700)  . potassium chloride 10 mEq (04/24/17 0837)   PRN Meds:.albuterol, potassium chloride, sodium chloride flush, traMADol  General appearance: alert and cooperative Neurologic: intact Heart: regular rate and rhythm, S1, S2 normal, no murmur, click, rub or gallop Lungs: diminished breath  sounds RLL Abdomen: soft, non-tender; bowel sounds normal; no masses,  no organomegaly Extremities: extremities normal, atraumatic, no cyanosis or edema and Homans sign is negative, no sign of DVT Wound: no air leak this am  Lab Results: CBC: Recent Labs  04/23/17 0409 04/24/17 0340  WBC 11.5* 9.3  HGB 8.6* 8.2*  HCT 26.9* 26.2*  PLT 164 197   BMET:  Recent Labs  04/24/17 0340 04/25/17 0500  NA 139 136  K 2.9* 3.8  CL 105 103  CO2 29 28  GLUCOSE 102* 103*  BUN 9 10  CREATININE 0.57 0.60  CALCIUM 8.2* 8.4*    CMET: Lab Results  Component Value Date   WBC 9.3 04/24/2017   HGB 8.2 (L) 04/24/2017   HCT 26.2 (L) 04/24/2017   PLT 197 04/24/2017   GLUCOSE 103 (H) 04/25/2017   CHOL 222 (H) 06/10/2011   TRIG 90 06/10/2011   HDL 70 06/10/2011   LDLCALC 134 (H) 06/10/2011   ALT 9 (L) 04/22/2017   AST 27 04/22/2017   NA 136 04/25/2017   K 3.8 04/25/2017   CL 103 04/25/2017   CREATININE 0.60 04/25/2017   BUN 10 04/25/2017   CO2 28 04/25/2017   INR 0.96 04/16/2017      PT/INR: No results for input(s): LABPROT,  INR in the last 72 hours. Radiology: No results found.  Xray not done this am yet- poss remove remaining chest tube after next xray   Assessment/Plan: S/P Procedure(s) (LRB): VIDEO BRONCHOSCOPY (N/A) VIDEO ASSISTED THORACOSCOPY (VATS)/RIGHT UPPER LOBECTOMY (Right) Mobilize Plan for transfer to step-down: see transfer orders Transferred yesterday, waiting for bed     Grace Isaac 04/25/2017 9:02 AM

## 2017-04-26 ENCOUNTER — Inpatient Hospital Stay (HOSPITAL_COMMUNITY): Payer: Medicare HMO

## 2017-04-26 LAB — URINALYSIS, ROUTINE W REFLEX MICROSCOPIC
Bacteria, UA: NONE SEEN
Bilirubin Urine: NEGATIVE
Glucose, UA: NEGATIVE mg/dL
Hgb urine dipstick: NEGATIVE
Ketones, ur: NEGATIVE mg/dL
Leukocytes, UA: NEGATIVE
Nitrite: NEGATIVE
Protein, ur: 30 mg/dL — AB
Specific Gravity, Urine: 1.021 (ref 1.005–1.030)
pH: 6 (ref 5.0–8.0)

## 2017-04-26 LAB — BASIC METABOLIC PANEL WITH GFR
Anion gap: 10 (ref 5–15)
BUN: 9 mg/dL (ref 6–20)
CO2: 25 mmol/L (ref 22–32)
Calcium: 8.6 mg/dL — ABNORMAL LOW (ref 8.9–10.3)
Chloride: 99 mmol/L — ABNORMAL LOW (ref 101–111)
Creatinine, Ser: 0.61 mg/dL (ref 0.44–1.00)
GFR calc Af Amer: >60 mL/min
GFR calc non Af Amer: >60 mL/min
Glucose, Bld: 118 mg/dL — ABNORMAL HIGH (ref 65–99)
Potassium: 3.4 mmol/L — ABNORMAL LOW (ref 3.5–5.1)
Sodium: 134 mmol/L — ABNORMAL LOW (ref 135–145)

## 2017-04-26 LAB — CBC
HCT: 27.3 % — ABNORMAL LOW (ref 36.0–46.0)
Hemoglobin: 9.1 g/dL — ABNORMAL LOW (ref 12.0–15.0)
MCH: 20.9 pg — ABNORMAL LOW (ref 26.0–34.0)
MCHC: 33.3 g/dL (ref 30.0–36.0)
MCV: 62.6 fL — ABNORMAL LOW (ref 78.0–100.0)
Platelets: 217 K/uL (ref 150–400)
RBC: 4.36 MIL/uL (ref 3.87–5.11)
RDW: 16.1 % — ABNORMAL HIGH (ref 11.5–15.5)
WBC: 19.1 K/uL — ABNORMAL HIGH (ref 4.0–10.5)

## 2017-04-26 LAB — GLUCOSE, CAPILLARY: Glucose-Capillary: 133 mg/dL — ABNORMAL HIGH (ref 65–99)

## 2017-04-26 MED ORDER — PIPERACILLIN-TAZOBACTAM 3.375 G IVPB 30 MIN: 3.3750 g | Freq: Three times a day (TID) | INTRAVENOUS | Status: DC

## 2017-04-26 MED ORDER — PIPERACILLIN-TAZOBACTAM 3.375 G IVPB
3.3750 g | Freq: Three times a day (TID) | INTRAVENOUS | Status: DC
  Administered 2017-04-26 – 2017-04-30 (×12): 3.375 g via INTRAVENOUS
  Filled 2017-04-26 (×13): qty 50

## 2017-04-26 MED ORDER — GUAIFENESIN ER 600 MG PO TB12
600.0000 mg | ORAL_TABLET | Freq: Two times a day (BID) | ORAL | Status: DC
  Administered 2017-04-26 – 2017-05-04 (×17): 600 mg via ORAL
  Filled 2017-04-26 (×17): qty 1

## 2017-04-26 MED ORDER — POTASSIUM CHLORIDE CRYS ER 20 MEQ PO TBCR
40.0000 meq | EXTENDED_RELEASE_TABLET | Freq: Once | ORAL | Status: AC
  Administered 2017-04-26: 40 meq via ORAL
  Filled 2017-04-26: qty 2

## 2017-04-26 MED ORDER — SODIUM CHLORIDE 0.9 % IV BOLUS (SEPSIS): 250.0000 mL | Freq: Once | INTRAVENOUS | Status: DC

## 2017-04-26 NOTE — Progress Notes (Signed)
Attempted to assist pt to [facility], though she was too weak to get OOB.  Was able to void on bedpan, and external female catheter placed.  Urine noted to be concentrated, tea colored.  Sent to lab for UA.  IJ line removed.  Pt resting in bed, family at bedside, call bell in reach.

## 2017-04-26 NOTE — Progress Notes (Signed)
Pt temp 100.1*F.  Family concerned that pt is lethargic and has not voided since this morning.  Bladder scan showed <100cc urine.  Urinalysis pending when pt is able to provide sample.  Awaiting sputum cultures as well.  IS encouraged and 650mg  tylenol administered.  Josie Saunders, PA notified of pt symptoms.  Orders received for 250cc fluid bolus.  IJ to be removed.

## 2017-04-26 NOTE — Progress Notes (Addendum)
      [address]       [place] [address]             [phone]       6 Days Post-Op Procedure(s) (LRB): VIDEO BRONCHOSCOPY (N/A) VIDEO ASSISTED THORACOSCOPY (VATS)/RIGHT UPPER LOBECTOMY (Right)  Subjective: Patient sitting up eating breakfast.   Objective: Vital signs in last 24 hours: Temp:  [98.4 F (36.9 C)-101.4 F (38.6 C)] 99.7 F (37.6 C) (07/15 0442) Pulse Rate:  [80-95] 91 (07/15 0442) Cardiac Rhythm: Normal sinus rhythm (07/14 2122) Resp:  [19-30] 23 (07/15 0442) BP: (112-131)/(51-81) 112/58 (07/15 0442) SpO2:  [94 %-99 %] 94 % (07/15 0442)  Intake/Output from previous day: 07/14 0701 - 07/15 0700 In: 360 [P.O.:360] Out: 860 [Urine:850; Chest Tube:10]   Physical Exam:  Cardiovascular: RRR Pulmonary: Coarse breath sounds on the right;clear on the left Abdomen: Soft, non tender, bowel sounds present. Extremities: Trace bilateral lower extremity edema. Wounds: Clean and dry.  No erythema or signs of infection. Chest Tube: to water seal, minor tidling with cough  Lab Results: CBC: Recent Labs  04/24/17 0340 04/26/17 0337  WBC 9.3 19.1*  HGB 8.2* 9.1*  HCT 26.2* 27.3*  PLT 197 217   BMET:  Recent Labs  04/25/17 0500 04/26/17 0337  NA 136 134*  K 3.8 3.4*  CL 103 99*  CO2 28 25  GLUCOSE 103* 118*  BUN 10 9  CREATININE 0.60 0.61  CALCIUM 8.4* 8.6*    PT/INR: No results for input(s): LABPROT, INR in the last 72 hours. ABG:  INR: Will add last result for INR, ABG once components are confirmed Will add last 4 CBG results once components are confirmed  Assessment/Plan:  1. CV - SR in the 80-90's. 2.  Pulmonary - On room air. CXR this am shows no pneumothorax, extensive subcutaneous emphysema. Hope to remove remaining chest tube soon. Check CXR in am. Encourage incentive spirometer and flutter valve. Mucinex for cough. 3. Supplement potassium 4. ABL anemia-H and H stable at 9.1 and 27.3 5. Leukocytosis-WBC increased  to 19,100. Fever to 101.4. No sign of wound infection. Will check sputum culture, UA, CXR in am. Empiric Zosyn.  ZIMMERMAN,DONIELLE MPA-C 04/26/2017,8:12 AM  With fever and elevated wbc have cultured and resumed iv antibiotics - no obvious source , no pneumonia on chest xray , wounds ok  Will d/c central line , leave chest tube one more day  I have seen and examined Dawn Lin and agree with the above assessment  and plan.  Grace Isaac MD Beeper [phone] Office [phone] 04/26/2017 12:39 PM

## 2017-04-27 ENCOUNTER — Inpatient Hospital Stay (HOSPITAL_COMMUNITY): Payer: Medicare HMO

## 2017-04-27 LAB — GRAM STAIN: Special Requests: NORMAL

## 2017-04-27 LAB — BASIC METABOLIC PANEL
Anion gap: 8 (ref 5–15)
BUN: 12 mg/dL (ref 6–20)
CO2: 26 mmol/L (ref 22–32)
Calcium: 8.3 mg/dL — ABNORMAL LOW (ref 8.9–10.3)
Chloride: 99 mmol/L — ABNORMAL LOW (ref 101–111)
Creatinine, Ser: 0.76 mg/dL (ref 0.44–1.00)
GFR calc Af Amer: >60 mL/min (ref >60)
GFR calc non Af Amer: >60 mL/min (ref >60)
Glucose, Bld: 117 mg/dL — ABNORMAL HIGH (ref 65–99)
Potassium: 4 mmol/L (ref 3.5–5.1)
Sodium: 133 mmol/L — ABNORMAL LOW (ref 135–145)

## 2017-04-27 LAB — CBC
HCT: 24.9 % — ABNORMAL LOW (ref 36.0–46.0)
Hemoglobin: 7.9 g/dL — ABNORMAL LOW (ref 12.0–15.0)
MCH: 19.6 pg — ABNORMAL LOW (ref 26.0–34.0)
MCHC: 31.7 g/dL (ref 30.0–36.0)
MCV: 61.8 fL — ABNORMAL LOW (ref 78.0–100.0)
Platelets: 216 10*3/uL (ref 150–400)
RBC: 4.03 MIL/uL (ref 3.87–5.11)
RDW: 15.7 % — ABNORMAL HIGH (ref 11.5–15.5)
WBC: 18.7 10*3/uL — ABNORMAL HIGH (ref 4.0–10.5)

## 2017-04-27 LAB — GLUCOSE, CAPILLARY: Glucose-Capillary: 88 mg/dL (ref 65–99)

## 2017-04-27 MED ORDER — ACETYLCYSTEINE 20 % IN SOLN
2.0000 mL | RESPIRATORY_TRACT | Status: DC
  Administered 2017-04-27 (×2): 2 mL via RESPIRATORY_TRACT
  Filled 2017-04-27 (×2): qty 4

## 2017-04-27 MED ORDER — LEVALBUTEROL HCL 0.63 MG/3ML IN NEBU: 0.6300 mg | INHALATION_SOLUTION | Freq: Four times a day (QID) | RESPIRATORY_TRACT | Status: DC

## 2017-04-27 MED ORDER — LEVALBUTEROL HCL 0.63 MG/3ML IN NEBU
0.6300 mg | INHALATION_SOLUTION | Freq: Two times a day (BID) | RESPIRATORY_TRACT | Status: DC
  Administered 2017-04-27 – 2017-04-29 (×5): 0.63 mg via RESPIRATORY_TRACT
  Filled 2017-04-27 (×5): qty 3

## 2017-04-27 MED ORDER — ACETYLCYSTEINE 20 % IN SOLN
2.0000 mL | Freq: Two times a day (BID) | RESPIRATORY_TRACT | Status: DC
  Administered 2017-04-28 – 2017-04-29 (×4): 2 mL via RESPIRATORY_TRACT
  Filled 2017-04-27 (×4): qty 4

## 2017-04-27 MED ORDER — VANCOMYCIN HCL 10 G IV SOLR
1250.0000 mg | Freq: Once | INTRAVENOUS | Status: AC
  Administered 2017-04-28: 1250 mg via INTRAVENOUS
  Filled 2017-04-27: qty 1250

## 2017-04-27 MED ORDER — VANCOMYCIN HCL 500 MG IV SOLR
500.0000 mg | Freq: Two times a day (BID) | INTRAVENOUS | Status: DC
  Administered 2017-04-28 – 2017-05-04 (×13): 500 mg via INTRAVENOUS
  Filled 2017-04-27 (×14): qty 500

## 2017-04-27 NOTE — Progress Notes (Addendum)
      [address]       [place] [address]             [phone]      7 Days Post-Op Procedure(s) (LRB): VIDEO BRONCHOSCOPY (N/A) VIDEO ASSISTED THORACOSCOPY (VATS)/RIGHT UPPER LOBECTOMY (Right)   Subjective:  No new complaints.  Asks when she will be walked and requesting pain medication prior to walk.  Objective: Vital signs in last 24 hours: Temp:  [98 F (36.7 C)-100.1 F (37.8 C)] 98.6 F (37 C) (07/16 0425) Pulse Rate:  [80-98] 80 (07/16 0425) Cardiac Rhythm: Normal sinus rhythm (07/16 0700) Resp:  [16-31] 16 (07/16 0425) BP: (96-129)/(41-80) 96/61 (07/16 0425) SpO2:  [92 %-97 %] 96 % (07/16 0425)  Intake/Output from previous day: 07/15 0701 - 07/16 0700 In: 500 [P.O.:480; I.V.:20] Out: 1670 [Urine:1650; Chest Tube:20]  General appearance: alert, cooperative and no distress Heart: regular rate and rhythm Lungs: clear to auscultation bilaterally Abdomen: soft, non-tender; bowel sounds normal; no masses,  no organomegaly Extremities: crepitus left side, mild edema Wound: clean and dry  Lab Results:  Recent Labs  04/26/17 0337 04/27/17 0228  WBC 19.1* 18.7*  HGB 9.1* 7.9*  HCT 27.3* 24.9*  PLT 217 216   BMET:  Recent Labs  04/26/17 0337 04/27/17 0228  NA 134* 133*  K 3.4* 4.0  CL 99* 99*  CO2 25 26  GLUCOSE 118* 117*  BUN 9 12  CREATININE 0.61 0.76  CALCIUM 8.6* 8.3*    PT/INR: No results for input(s): LABPROT, INR in the last 72 hours. ABG    Component Value Date/Time   PHART 7.405 04/21/2017 0410   HCO3 25.1 04/21/2017 0410   O2SAT 95.3 04/21/2017 0410   CBG (last 3)   Recent Labs  04/26/17 1510  GLUCAP 133*    Assessment/Plan: S/P Procedure(s) (LRB): VIDEO BRONCHOSCOPY (N/A) VIDEO ASSISTED THORACOSCOPY (VATS)/RIGHT UPPER LOBECTOMY (Right)  1. Chest tube- no air leak present, sub q air remains present- CXR free from pneumothorax 2. Pulm- no acute complaints, off oxygen, continue IS, Mucinex for cough 3.  ID- afebrile, leukocytosis mildly reduced, UA negative, sputum culture remains pending- on empiric zosyn 4. Dispo- patient stable, for SNF when discharge appropriate, continue zosyn for now, no acute source of infection identified, defer chest tube removal to Dr. Servando Snare   LOS: 7 days    Ellwood Handler 04/27/2017  Dg Chest Port 1 View  Result Date: 04/27/2017 CLINICAL DATA:  Pneumothorax.  Chest tube . EXAM: PORTABLE CHEST 1 VIEW COMPARISON:  04/26/2017. FINDINGS: Postsurgical changes right chest. Right chest tube in stable position. Interim removal of right IJ line. No definite pneumothorax identified. Heart size stable . Extensive right chest wall subcutaneous emphysema again noted. IMPRESSION: Postsurgical changes right chest. Right chest tube in stable position. No definite pneumothorax identified. Extensive right chest wall subcutaneous emphysema again noted. Chest is stable from prior exam. Electronically Signed   By: Marcello Moores  Register   On: 04/27/2017 07:19    Central line out , wbc up cultures pending started on zosyn Wound intact without infection  Will leave ct in place and cultures ct drainage  I have seen and examined Dawn Lin and agree with the above assessment  and plan.  Grace Isaac MD Beeper [phone] Office [phone] 04/27/2017 2:04 PM

## 2017-04-27 NOTE — Care Management Important Message (Signed)
Important Message  Patient Details  Name: Dawn Lin MRN: [id number] Date of Birth: Oct 07, 1941   Medicare Important Message Given:  Yes    Orbie Pyo 04/27/2017, 2:31 PM

## 2017-04-27 NOTE — Care Management Note (Addendum)
Case Management Note  Patient Details  Name: Dawn Lin MRN: [id number] Date of Birth: [birth year]  Subjective/Objective: Pt presented for Video bronchoscopy, right video-assistedthoracoscopy, right upper lobectomy 04-20-17. Continues with Chest tube in place. Pt with increased WBC/ temp >100. Continues on Empiric Zosyn- cultures pending. Plan for SNF once stable. CSW is following.                    Action/Plan: CM will continue to monitor for additional needs.   Expected Discharge Date:                  Expected Discharge Plan:  [place]  In-House Referral:  Clinical Social Work  Discharge planning Services  CM Consult  Post Acute Care Choice:  NA Choice offered to:  NA  DME Arranged:  N/A DME Agency:  NA  HH Arranged:  NA HH Agency:  NA  Status of Service:  Completed, signed off  If discussed at [place] of Stay Meetings, dates discussed:  04-28-17  Additional Comments:  Bethena Roys, RN 04/27/2017, 12:26 PM

## 2017-04-27 NOTE — Progress Notes (Signed)
Pharmacy Antibiotic Note  Dawn Lin is a 76 y.o. female s/p VATS and noted with recent fever and pleural fluid showing GPC/clusters .  Pharmacy has been consulted for vancomycin dosing (patient is also on zosyn D2) -WBC= 18.7, tmax= 101.3, [place]- 0.76 and CrCL ~ 50  Plan: -Vancomycin 1250mg  IV x1 followed by 500mg  IV q12h -Will follow renal function, cultures and clinical progress   Height: 4\' 11"  (149.9 cm) Weight: 133 lb 13.1 oz (60.7 kg) IBW/kg (Calculated) : 43.2  Temp (24hrs), Avg:99.3 F (37.4 C), Min:98 F (36.7 C), Max:101.3 F (38.5 C)   Recent Labs Lab 04/22/17 0350 04/23/17 0409 04/24/17 0340 04/25/17 0500 04/26/17 0337 04/27/17 0228  WBC 13.9* 11.5* 9.3  --  19.1* 18.7*  CREATININE 0.53 0.51 0.57 0.60 0.61 0.76    Estimated Creatinine Clearance: 48.2 mL/min (by C-G formula based on SCr of 0.76 mg/dL).    No Known Allergies  Antimicrobials this admission: 7/15 zosyn 7/16 vanc  Dose adjustments this admission:   Microbiology results: 7/16 pleural fluid: GPC/clusters  Thank you for allowing pharmacy to be a part of this patient's care.  Hildred Laser, Pharm D 04/27/2017 9:51 PM

## 2017-04-27 NOTE — Progress Notes (Signed)
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 Chest CT- small left PTX. s/p chest tube placement. PMH includes HLD, HTN, anxiety.    PT Comments    Pt declined from previous session and required increased assistance and cueing for safety. Pt limited due to fatigue and feeling "wobbly", stated she felt like she was "wheezing". Pt presents with increased DOE HR elevated to 115 BPM, SP02 within normal limits. VC's required for transfers and gait training. Next Tx to increase ambulation distance and complete exercises.   Follow Up Recommendations  SNF;Supervision for mobility/OOB     Equipment Recommendations  Rolling walker with 5" wheels    Recommendations for Other Services OT consult     Precautions / Restrictions Precautions Precautions: Fall Restrictions Weight Bearing Restrictions: No    Mobility  Bed Mobility               General bed mobility comments: Up in chair upon arrival.   Transfers Overall transfer level: Needs assistance Equipment used: Rolling walker (2 wheeled) Transfers: Sit to/from Stand Sit to Stand: Min assist         General transfer comment: Required a boost into standing, VC's for hand placement, mod assist from stand to sit- pt reported feeling afraid to fall when sitting to use BSC and in chair therefore requiring increase assist  Ambulation/Gait Ambulation/Gait assistance: Min assist Ambulation Distance (Feet): 70 Feet Assistive device: Rolling walker (2 wheeled) Gait Pattern/deviations: Step-to pattern;Decreased step length - right;Decreased step length - left;Decreased stride length;Trunk flexed;Shuffle;Drifts right/left;Decreased dorsiflexion - right;Decreased dorsiflexion - left Gait velocity: decreased   General Gait Details:  VC's for posture, to look forward, increase step length, decrease drift to the left, initiate step through pattern, increase dorsiflexion to pick of feet higher. Pt required min assist for turning and managing RW. VC's to step closer to RW to improve position.    Stairs            Wheelchair Mobility    Modified Rankin (Stroke Patients Only)       Balance Overall balance assessment: Needs assistance Sitting-balance support: Feet supported;No upper extremity supported Sitting balance-Leahy Scale: Good     Standing balance support: Bilateral upper extremity supported;During functional activity Standing balance-Leahy Scale: Poor Standing balance comment: Reliant on AD for support in standing.                            Cognition Arousal/Alertness: Awake/alert Behavior During Therapy: WFL for tasks assessed/performed Overall Cognitive Status: Within Functional Limits for tasks assessed                                        Exercises      General Comments        Pertinent Vitals/Pain Pain Assessment: Faces Faces Pain Scale: Hurts a little bit Pain Location: chest tube area Pain Descriptors / Indicators: Sore Pain Intervention(s): Monitored during session    Home Living                      Prior Function            PT Goals (current goals can  now be found in the care plan section) Progress towards PT goals: Progressing toward goals    Frequency    Min 3X/week      PT Plan Current plan remains appropriate    Co-evaluation              AM-PAC PT "6 Clicks" Daily Activity  Outcome Measure  Difficulty turning over in bed (including adjusting bedclothes, sheets and blankets)?: None Difficulty moving from lying on back to sitting on the side of the bed? : A Little Difficulty sitting down on and standing up from a chair with arms (e.g., wheelchair, bedside commode, etc,.)?: A Lot Help needed moving to and from  a bed to chair (including a wheelchair)?: A Lot Help needed walking in hospital room?: A Little Help needed climbing 3-5 steps with a railing? : A Lot 6 Click Score: 16    End of Session Equipment Utilized During Treatment: Other (comment) (No gait belt due to drainage tube, close supervision ) Activity Tolerance: Patient limited by fatigue Patient left: in chair;with call bell/phone within reach;with nursing/sitter in room Nurse Communication: Mobility status PT Visit Diagnosis: Pain;Unsteadiness on feet (R26.81)     Time: 0626-9485 PT Time Calculation (min) (ACUTE ONLY): 30 min  Charges:  $Gait Training: 8-22 mins $Therapeutic Activity: 8-22 mins                    G Codes:       Abayomi Pattison, SPTA [id number]   Lyndsay Talamante 04/27/2017, 4:18 PM

## 2017-04-28 ENCOUNTER — Inpatient Hospital Stay (HOSPITAL_COMMUNITY): Payer: Medicare HMO

## 2017-04-28 LAB — BASIC METABOLIC PANEL
Anion gap: 9 (ref 5–15)
BUN: 10 mg/dL (ref 6–20)
CO2: 27 mmol/L (ref 22–32)
Calcium: 8.4 mg/dL — ABNORMAL LOW (ref 8.9–10.3)
Chloride: 96 mmol/L — ABNORMAL LOW (ref 101–111)
Creatinine, Ser: 0.75 mg/dL (ref 0.44–1.00)
GFR calc Af Amer: >60 mL/min (ref >60)
GFR calc non Af Amer: >60 mL/min (ref >60)
Glucose, Bld: 116 mg/dL — ABNORMAL HIGH (ref 65–99)
Potassium: 3.3 mmol/L — ABNORMAL LOW (ref 3.5–5.1)
Sodium: 132 mmol/L — ABNORMAL LOW (ref 135–145)

## 2017-04-28 LAB — CBC
HCT: 22.1 % — ABNORMAL LOW (ref 36.0–46.0)
Hemoglobin: 7.2 g/dL — ABNORMAL LOW (ref 12.0–15.0)
MCH: 19.9 pg — ABNORMAL LOW (ref 26.0–34.0)
MCHC: 32.6 g/dL (ref 30.0–36.0)
MCV: 61 fL — ABNORMAL LOW (ref 78.0–100.0)
Platelets: 241 10*3/uL (ref 150–400)
RBC: 3.62 MIL/uL — ABNORMAL LOW (ref 3.87–5.11)
RDW: 15.6 % — ABNORMAL HIGH (ref 11.5–15.5)
WBC: 12.2 10*3/uL — ABNORMAL HIGH (ref 4.0–10.5)

## 2017-04-28 MED ORDER — POTASSIUM CHLORIDE CRYS ER 20 MEQ PO TBCR
40.0000 meq | EXTENDED_RELEASE_TABLET | Freq: Two times a day (BID) | ORAL | Status: DC
  Administered 2017-04-28 – 2017-05-04 (×13): 40 meq via ORAL
  Filled 2017-04-28 (×13): qty 2

## 2017-04-28 NOTE — Progress Notes (Addendum)
      [address]       [place] [address]             [phone]      8 Days Post-Op Procedure(s) (LRB): VIDEO BRONCHOSCOPY (N/A) VIDEO ASSISTED THORACOSCOPY (VATS)/RIGHT UPPER LOBECTOMY (Right)   Subjective:  Ms. Giles has no complaints this morning.  She has been up ambulating this morning.  Objective: Vital signs in last 24 hours: Temp:  [97.8 F (36.6 C)-101.3 F (38.5 C)] 97.8 F (36.6 C) (07/17 0357) Pulse Rate:  [76-87] 79 (07/17 0357) Cardiac Rhythm: Normal sinus rhythm (07/17 0700) Resp:  [14-18] 18 (07/17 0357) BP: (101-145)/(52-59) 101/58 (07/17 0357) SpO2:  [93 %-100 %] 97 % (07/17 0357)  Intake/Output from previous day: 07/16 0701 - 07/17 0700 In: 120 [P.O.:120] Out: 1730 [Urine:1700; Chest Tube:30]  General appearance: alert, cooperative and no distress Heart: regular rate and rhythm Lungs: diminished breath sounds bibasilar Abdomen: soft, non-tender; bowel sounds normal; no masses,  no organomegaly Extremities: extremities normal, atraumatic, no cyanosis or edema Wound: clean and dry, chest tube drainage is now purulent  Lab Results:  Recent Labs  04/27/17 0228 04/28/17 0301  WBC 18.7* 12.2*  HGB 7.9* 7.2*  HCT 24.9* 22.1*  PLT 216 241   BMET:  Recent Labs  04/27/17 0228 04/28/17 0301  NA 133* 132*  K 4.0 3.3*  CL 99* 96*  CO2 26 27  GLUCOSE 117* 116*  BUN 12 10  CREATININE 0.76 0.75  CALCIUM 8.3* 8.4*    PT/INR: No results for input(s): LABPROT, INR in the last 72 hours. ABG    Component Value Date/Time   PHART 7.405 04/21/2017 0410   HCO3 25.1 04/21/2017 0410   O2SAT 95.3 04/21/2017 0410   CBG (last 3)   Recent Labs  04/26/17 1510 04/27/17 1115  GLUCAP 133* 88    Assessment/Plan: S/P Procedure(s) (LRB): VIDEO BRONCHOSCOPY (N/A) VIDEO ASSISTED THORACOSCOPY (VATS)/RIGHT UPPER LOBECTOMY (Right)  1. Chest tube- no air leak on water seal, drainage is now purulent, culture of fluid sent yesterday-  leave chest tube in place today 2. Pulm- no acute issues, not on oxygen, CXR is stable without pneumothorax, stable appearance of sub q air 3. ID- on Vanc, Zosyn, leukocytosis, febrile yesterday- infection is likely from purulent pleural fluid 4. Post operative blood loss anemia- Hgb down to 7.2, patient has Thalassemia 5. Dispo- continue empiric ABX, chest tube drainage is now purulent and likely source of fever/white count, cultures remain pending.. Leave chest tube in place   LOS: 8 days    BARRETT, ERIN 04/28/2017  Smear of chest tube fluid, noted yesterday to have grom + cocci - started on vancomycin 12 hours ago, so far culture negative , re incubated  for better growth Leave chest tube Wound ok Suspect empyema , with chest tube in place draining  WBC decreased 13,000

## 2017-04-29 ENCOUNTER — Inpatient Hospital Stay (HOSPITAL_COMMUNITY): Payer: Medicare HMO

## 2017-04-29 LAB — BASIC METABOLIC PANEL
Anion gap: 9 (ref 5–15)
BUN: 10 mg/dL (ref 6–20)
CO2: 27 mmol/L (ref 22–32)
Calcium: 8.6 mg/dL — ABNORMAL LOW (ref 8.9–10.3)
Chloride: 99 mmol/L — ABNORMAL LOW (ref 101–111)
Creatinine, Ser: 0.72 mg/dL (ref 0.44–1.00)
GFR calc Af Amer: >60 mL/min (ref >60)
GFR calc non Af Amer: >60 mL/min (ref >60)
Glucose, Bld: 108 mg/dL — ABNORMAL HIGH (ref 65–99)
Potassium: 4.2 mmol/L (ref 3.5–5.1)
Sodium: 135 mmol/L (ref 135–145)

## 2017-04-29 LAB — CBC
HCT: 22.3 % — ABNORMAL LOW (ref 36.0–46.0)
Hemoglobin: 7.4 g/dL — ABNORMAL LOW (ref 12.0–15.0)
MCH: 20.4 pg — ABNORMAL LOW (ref 26.0–34.0)
MCHC: 33.2 g/dL (ref 30.0–36.0)
MCV: 61.4 fL — ABNORMAL LOW (ref 78.0–100.0)
Platelets: 293 10*3/uL (ref 150–400)
RBC: 3.63 MIL/uL — ABNORMAL LOW (ref 3.87–5.11)
RDW: 15.5 % (ref 11.5–15.5)
WBC: 8.5 10*3/uL (ref 4.0–10.5)

## 2017-04-29 LAB — EXPECTORATED SPUTUM ASSESSMENT W GRAM STAIN, RFLX TO RESP C: Special Requests: NORMAL

## 2017-04-29 MED ORDER — LACTULOSE 10 GM/15ML PO SOLN
20.0000 g | Freq: Every day | ORAL | Status: DC | PRN
  Administered 2017-04-30: 20 g via ORAL
  Filled 2017-04-29: qty 30

## 2017-04-29 NOTE — Progress Notes (Signed)
Physical Therapy Treatment Patient Details Name: Dawn Lin MRN: [id number] DOB: 11-23-1940 Today's Date: 04/29/2017    History of Present Illness Patient is a 76 y/o female who presents with right lung mass s/p VATS and right upper lobectomy. Chest CT- small left PTX. s/p chest tube placement. PMH includes HLD, HTN, anxiety.    PT Comments    Pt is making good progress towards her goals. Pt currently supervision for transfers and min guard for ambulation of 700 feet with RW. Pt requires skilled PT to progress gait training and to improve LE strength and endurance to safely navigate in her discharge environment.    Follow Up Recommendations  SNF;Supervision for mobility/OOB     Equipment Recommendations  Rolling walker with 5" wheels    Recommendations for Other Services OT consult     Precautions / Restrictions Precautions Precautions: Fall Precaution Comments: chest tube to water seal Restrictions Weight Bearing Restrictions: No    Mobility  Bed Mobility               General bed mobility comments: Up in chair upon arrival.   Transfers Overall transfer level: Needs assistance Equipment used: Rolling walker (2 wheeled) Transfers: Sit to/from Stand Sit to Stand: Supervision            Ambulation/Gait Ambulation/Gait assistance: Min guard Ambulation Distance (Feet): 500 Feet Assistive device: Rolling walker (2 wheeled) Gait Pattern/deviations: Step-through pattern;Decreased stride length;Narrow base of support Gait velocity: decreased Gait velocity interpretation: Below normal speed for age/gender General Gait Details: slow, steady gait, 1x standing rest break for 2/4 DoE, HR stable in 90s throughout session. cues for RW management          Balance   Sitting-balance support: Feet supported;No upper extremity supported Sitting balance-Leahy Scale: Good     Standing balance support: During functional activity Standing balance-Leahy Scale:  Good Standing balance comment: able to reach outside her BoS                            Cognition Arousal/Alertness: Awake/alert Behavior During Therapy: WFL for tasks assessed/performed Overall Cognitive Status: Within Functional Limits for tasks assessed                                           General Comments General comments (skin integrity, edema, etc.): VSS pt's son and family in room during session      Pertinent Vitals/Pain Pain Assessment: Faces Faces Pain Scale: Hurts little more Pain Location: chest tube area Pain Descriptors / Indicators: Sore           PT Goals (current goals can now be found in the care plan section) Acute Rehab PT Goals PT Goal Formulation: With patient Time For Goal Achievement: 05/07/17 Potential to Achieve Goals: Good Progress towards PT goals: Progressing toward goals    Frequency    Min 3X/week      PT Plan Current plan remains appropriate       AM-PAC PT "6 Clicks" Daily Activity  Outcome Measure  Difficulty turning over in bed (including adjusting bedclothes, sheets and blankets)?: None Difficulty moving from lying on back to sitting on the side of the bed? : Total Difficulty sitting down on and standing up from a chair with arms (e.g., wheelchair, bedside commode, etc,.)?: Total Help needed moving to and from a bed  to chair (including a wheelchair)?: A Little Help needed walking in hospital room?: A Little Help needed climbing 3-5 steps with a railing? : A Little 6 Click Score: 15    End of Session Equipment Utilized During Treatment: Gait belt Activity Tolerance: Patient tolerated treatment well Patient left: in chair;with call bell/phone within reach;with nursing/sitter in room;with family/visitor present Nurse Communication: Mobility status PT Visit Diagnosis: Pain;Unsteadiness on feet (R26.81) Pain - part of body:  (rib cage)     Time: 2023-3435 PT Time Calculation (min) (ACUTE  ONLY): 28 min  Charges:  $Gait Training: 8-22 mins $Therapeutic Activity: 8-22 mins                    G Codes:       Pike Scantlebury B. Migdalia Dk PT, DPT Acute Rehabilitation  [phone] Pager [phone]     [place] 04/29/2017, 5:43 PM

## 2017-04-29 NOTE — Progress Notes (Addendum)
      [address]       [place] [address]             [phone]      9 Days Post-Op Procedure(s) (LRB): VIDEO BRONCHOSCOPY (N/A) VIDEO ASSISTED THORACOSCOPY (VATS)/RIGHT UPPER LOBECTOMY (Right)   Subjective:  [name] complains she wants to walk more than she is.  She states she feels stable enough to walk on her own. She hasn't moved her bowels in several days.  Objective: Vital signs in last 24 hours: Temp:  [98.3 F (36.8 C)-98.6 F (37 C)] 98.3 F (36.8 C) (07/18 0414) Pulse Rate:  [70-88] 70 (07/18 0414) Cardiac Rhythm: Normal sinus rhythm (07/18 0700) Resp:  [17-28] 17 (07/18 0414) BP: (110-123)/(52-53) 110/52 (07/18 0414) SpO2:  [93 %-100 %] 97 % (07/18 0414)  Intake/Output from previous day: 07/17 0701 - 07/18 0700 In: 660 [P.O.:360; IV Piggyback:300] Out: 2035 [Urine:2025; Chest Tube:10] Intake/Output this shift: Total I/O In: 240 [P.O.:240] Out: -   General appearance: alert, cooperative and no distress Heart: regular rate and rhythm Lungs: diminished breath sounds right base Abdomen: soft, non-tender; bowel sounds normal; no masses,  no organomegaly Extremities: edema trace Wound: clean and dry  Lab Results:  Recent Labs  04/28/17 0301 04/29/17 0240  WBC 12.2* 8.5  HGB 7.2* 7.4*  HCT 22.1* 22.3*  PLT 241 293   BMET:  Recent Labs  04/28/17 0301 04/29/17 0240  NA 132* 135  K 3.3* 4.2  CL 96* 99*  CO2 27 27  GLUCOSE 116* 108*  BUN 10 10  CREATININE 0.75 0.72  CALCIUM 8.4* 8.6*    PT/INR: No results for input(s): LABPROT, INR in the last 72 hours. ABG    Component Value Date/Time   PHART 7.405 04/21/2017 0410   HCO3 25.1 04/21/2017 0410   O2SAT 95.3 04/21/2017 0410   CBG (last 3)   Recent Labs  04/26/17 1510 04/27/17 1115  GLUCAP 133* 88    Assessment/Plan: S/P Procedure(s) (LRB): VIDEO BRONCHOSCOPY (N/A) VIDEO ASSISTED THORACOSCOPY (VATS)/RIGHT UPPER LOBECTOMY (Right)  1. Chest tube- 20 cc  purulent drainage yesterday, no air leak- leave in place for now 2. Pulm- no acute changes, continued sub q air present, no pneumothorax  3. ID- pleural fluid culture is grown Staph aureus, continue Vanc, Zosyn for now, sensitivities are pending... Afebrile, leukocytosis improved 4. Chronic Anemia, due to Thalassemia 5. Dispo- patient stable, leave chest tube for now, ID sensitivities pending   LOS: 9 days    BARRETT, ERIN 04/29/2017  Chest tube drainage decreased , chest drainage is growing staph, currently covered, sensitivities pending  Wbc decreasing  I have seen and examined Ivan Anchors and agree with the above assessment  and plan.  Grace Isaac MD Beeper [phone] Office [phone] 04/29/2017 6:31 PM

## 2017-04-30 ENCOUNTER — Inpatient Hospital Stay (HOSPITAL_COMMUNITY): Payer: Medicare HMO

## 2017-04-30 DIAGNOSIS — Z8249 Family history of ischemic heart disease and other diseases of the circulatory system: Secondary | ICD-10-CM

## 2017-04-30 DIAGNOSIS — C3411 Malignant neoplasm of upper lobe, right bronchus or lung: Principal | ICD-10-CM

## 2017-04-30 DIAGNOSIS — Z902 Acquired absence of lung [part of]: Secondary | ICD-10-CM

## 2017-04-30 DIAGNOSIS — D72829 Elevated white blood cell count, unspecified: Secondary | ICD-10-CM

## 2017-04-30 DIAGNOSIS — D569 Thalassemia, unspecified: Secondary | ICD-10-CM

## 2017-04-30 DIAGNOSIS — B9562 Methicillin resistant Staphylococcus aureus infection as the cause of diseases classified elsewhere: Secondary | ICD-10-CM

## 2017-04-30 DIAGNOSIS — J869 Pyothorax without fistula: Secondary | ICD-10-CM

## 2017-04-30 DIAGNOSIS — Z9689 Presence of other specified functional implants: Secondary | ICD-10-CM

## 2017-04-30 LAB — VANCOMYCIN, TROUGH: Vancomycin Tr: 16 ug/mL (ref 15–20)

## 2017-04-30 NOTE — Consult Note (Signed)
[facility] for Infectious Disease    Date of Admission:  04/20/2017    Total days of antibiotics 5         Day 3 of vancomycin  Zosyn 7/15-7/19               Reason for Consult: Right pleural effusion with MRSA     Referring Provider: Lanelle Bal MD Primary Care Provider: Vyas,Dhruv  Assessment: Pleural effusion with MRSA s/p lobectomy for adenocarcinoma of lung. Patient seems improving and responding well to vancomycin. She can be discharged home on oral doxycycline for 2 weeks once chest tube drainage is clear.  Plan: 1. Continue vancomycin as an inpatient, can be discharged home on doxycycline 100 mg twice daily  for 2 weeks.  Active Problems:   Malignant neoplasm of right upper lobe of lung (HCC)   S/P lobectomy of lung   . amLODipine  10 mg Oral Daily  . aspirin EC  81 mg Oral Daily  . bisacodyl  10 mg Oral Daily  . escitalopram  10 mg Oral Daily  . guaiFENesin  600 mg Oral BID  . hydrochlorothiazide  25 mg Oral Daily  . pantoprazole  40 mg Oral Daily  . potassium chloride  40 mEq Oral BID  . senna-docusate  1 tablet Oral QHS  . sodium chloride flush  10-40 mL Intracatheter Q12H    HPI: Dawn Lin is a 76 y.o. female with history significant for adenocarcinoma of right upper lung lobe, s/o lobectomy on 04/20/17 and pleural culture was positive for MRSA. She was initially given vancomycin and Zosyn, Zosyn was stopped today after getting final sensitivity report. ID was consulted for antibiotic management. Patient continued to get purulent chest tube drainage although decreasing in quantity, she remained afebrile over the past 48 hours and leukocytosis is improving. Patient was feeling better overall, still having mild discomfort over surgical site and some cough. Her culture results shows sensitivity to clindamycin, gentamicin, rifampin, tetracycline and vancomycin.   Review of Systems: ROS  Past Medical History:  Diagnosis Date  .  Anxiety   . Dyspnea    with exertion  . 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 Family history of adverse reaction to anesthesia    Children - N/V  . History of blood transfusion   . Hyperlipidemia   . Other osteoporosis   . Other thalassemia [facility])     Social History  Substance Use Topics  . Smoking status: Never Smoker  . Smokeless tobacco: Never Used  . Alcohol use No    Family History  Problem Relation Age of Onset  . Hypertension Mother   . Heart attack Sister 60   No Known Allergies  OBJECTIVE: Blood pressure (!) 116/57, pulse 76, temperature 98.3 F (36.8 C), temperature source Oral, resp. rate 16, height 4\' 11"  (1.499 m), weight 133 lb 13.1 oz (60.7 kg), SpO2 98 %.  Physical Exam  Vitals:   04/29/17 2037 04/29/17 2104 04/30/17 0415 04/30/17 1045  BP:  (!) 116/54  (!) 116/57  Pulse: 76     Resp: 16     Temp:  98.2 F (36.8 C) 98.3 F (36.8 C)   TempSrc:  Oral Oral   SpO2: 98%     Weight:      Height:       General: Vital signs reviewed.  Patient is well-developed and well-nourished, in no acute distress and cooperative with exam.  Cardiovascular: RRR,  S1 normal, S2 normal, no murmurs, gallops, or rubs. Pulmonary/Chest: Decreased breath sounds at bases. Abdominal: Soft, non-tender, non-distended, BS +, no masses, organomegaly, or guarding present.  Extremities: No lower extremity edema bilaterally,  pulses symmetric and intact bilaterally. No cyanosis or clubbing. Psychiatric: Normal mood and affect. speech and behavior is normal. Cognition and memory are normal.  Lab Results Lab Results  Component Value Date   WBC 8.5 04/29/2017   HGB 7.4 (L) 04/29/2017   HCT 22.3 (L) 04/29/2017   MCV 61.4 (L) 04/29/2017   PLT 293 04/29/2017    Lab Results  Component Value Date   CREATININE 0.72 04/29/2017   BUN 10 04/29/2017   NA 135 04/29/2017   K 4.2 04/29/2017   CL 99 (L) 04/29/2017   CO2 27 04/29/2017    Lab Results  Component Value Date    ALT 9 (L) 04/22/2017   AST 27 04/22/2017   ALKPHOS 43 04/22/2017   BILITOT 1.0 04/22/2017     Microbiology: Recent Results (from the past 240 hour(s))  Culture, expectorated sputum-assessment     Status: None   Collection Time: 04/26/17 10:52 AM  Result Value Ref Range Status   Specimen Description EXPECTORATED SPUTUM  Final   Special Requests Normal  Final   Sputum evaluation THIS SPECIMEN IS ACCEPTABLE FOR SPUTUM CULTURE  Final   Report Status 04/29/2017 FINAL  Final  Culture, respiratory (NON-Expectorated)     Status: None (Preliminary result)   Collection Time: 04/26/17 10:52 AM  Result Value Ref Range Status   Specimen Description EXPECTORATED SPUTUM  Final   Special Requests Normal Reflexed from [id number]  Final   Gram Stain   Final    ABUNDANT WBC PRESENT,BOTH PMN AND MONONUCLEAR RARE GRAM NEGATIVE RODS RARE GRAM POSITIVE RODS RARE GRAM POSITIVE COCCI IN PAIRS RARE YEAST    Culture CULTURE REINCUBATED FOR BETTER GROWTH  Final   Report Status PENDING  Incomplete  Gram stain     Status: None   Collection Time: 04/27/17  6:47 PM  Result Value Ref Range Status   Specimen Description PLEURAL RIGHT  Final   Special Requests Normal  Final   Gram Stain   Final    ABUNDANT WBC PRESENT, PREDOMINANTLY PMN MODERATE GRAM POSITIVE COCCI IN CLUSTERS    Report Status 04/27/2017 FINAL  Final  Body fluid culture     Status: None (Preliminary result)   Collection Time: 04/27/17  6:47 PM  Result Value Ref Range Status   Specimen Description PLEURAL RIGHT  Final   Special Requests Normal  Final   Culture   Final    MODERATE METHICILLIN RESISTANT STAPHYLOCOCCUS AUREUS   Report Status PENDING  Incomplete   Organism ID, Bacteria METHICILLIN RESISTANT STAPHYLOCOCCUS AUREUS  Final      Susceptibility   Methicillin resistant staphylococcus aureus - MIC*    CIPROFLOXACIN >=8 RESISTANT Resistant     ERYTHROMYCIN >=8 RESISTANT Resistant     GENTAMICIN <=0.5 SENSITIVE Sensitive      OXACILLIN >=4 RESISTANT Resistant     TETRACYCLINE <=1 SENSITIVE Sensitive     VANCOMYCIN 1 SENSITIVE Sensitive     TRIMETH/SULFA >=320 RESISTANT Resistant     CLINDAMYCIN <=0.25 SENSITIVE Sensitive     RIFAMPIN <=0.5 SENSITIVE Sensitive     Inducible Clindamycin NEGATIVE Sensitive     * MODERATE METHICILLIN RESISTANT STAPHYLOCOCCUS AUREUS    Lorella Nimrod, [place] [facility] [phone] pager   [phone] cell 04/30/2017,  11:26 AM

## 2017-04-30 NOTE — Progress Notes (Addendum)
      [address]       [place] [address]             [phone]      10 Days Post-Op Procedure(s) (LRB): VIDEO BRONCHOSCOPY (N/A) VIDEO ASSISTED THORACOSCOPY (VATS)/RIGHT UPPER LOBECTOMY (Right)   Subjective:  Ms. Shrake continues to have no complaints.  She is ambulating without much difficulty.  + BM  Objective: Vital signs in last 24 hours: Temp:  [98.1 F (36.7 C)-98.3 F (36.8 C)] 98.3 F (36.8 C) (07/19 0415) Pulse Rate:  [76-87] 76 (07/18 2037) Cardiac Rhythm: Normal sinus rhythm (07/19 0700) Resp:  [15-22] 16 (07/18 2037) BP: (116-134)/(54-65) 116/54 (07/18 2104) SpO2:  [90 %-99 %] 98 % (07/18 2037)  Intake/Output from previous day: 07/18 0701 - 07/19 0700 In: 873 [P.O.:720; I.V.:3; IV Piggyback:150] Out: 1255 [Urine:1225; Chest Tube:30]  General appearance: alert, cooperative and no distress Heart: regular rate and rhythm Lungs: diminished breath sounds right base Abdomen: soft, non-tender; bowel sounds normal; no masses,  no organomegaly Extremities: extremities normal, atraumatic, no cyanosis or edema Wound: clean and dry  Lab Results:  Recent Labs  04/28/17 0301 04/29/17 0240  WBC 12.2* 8.5  HGB 7.2* 7.4*  HCT 22.1* 22.3*  PLT 241 293   BMET:  Recent Labs  04/28/17 0301 04/29/17 0240  NA 132* 135  K 3.3* 4.2  CL 96* 99*  CO2 27 27  GLUCOSE 116* 108*  BUN 10 10  CREATININE 0.75 0.72  CALCIUM 8.4* 8.6*    PT/INR: No results for input(s): LABPROT, INR in the last 72 hours. ABG    Component Value Date/Time   PHART 7.405 04/21/2017 0410   HCO3 25.1 04/21/2017 0410   O2SAT 95.3 04/21/2017 0410   CBG (last 3)   Recent Labs  04/27/17 1115  GLUCAP 88    Assessment/Plan: S/P Procedure(s) (LRB): VIDEO BRONCHOSCOPY (N/A) VIDEO ASSISTED THORACOSCOPY (VATS)/RIGHT UPPER LOBECTOMY (Right)  1. Chest tube- minimal output, remains purulent- leave in place until output clears 2. Pulm- no acute issues, good use of IS,  stable appearance of CXR 3. ID- Pleural Fluid + MRSA, sensitive to Vanc, will d/c Zosyn... Can transition to Clindamycin at discharge, remains afebrile 4. Chronic Anemia, Thalassema 5. Dispo- patient stable, culture shows MRSA, started contact precautions, continue Vancomycin, d/c Zosyn...oral clindamycin at discharge   LOS: 10 days    [place] Handler 04/30/2017 Leave tube today  I have seen and examined Ivan Anchors and agree with the above assessment  and plan.  Grace Isaac MD Beeper [phone] Office [phone] 04/30/2017 9:09 AM

## 2017-05-01 ENCOUNTER — Inpatient Hospital Stay (HOSPITAL_COMMUNITY): Payer: Medicare HMO

## 2017-05-01 LAB — BASIC METABOLIC PANEL
Anion gap: 9 (ref 5–15)
BUN: 9 mg/dL (ref 6–20)
CO2: 25 mmol/L (ref 22–32)
Calcium: 8.8 mg/dL — ABNORMAL LOW (ref 8.9–10.3)
Chloride: 101 mmol/L (ref 101–111)
Creatinine, Ser: 0.73 mg/dL (ref 0.44–1.00)
GFR calc Af Amer: >60 mL/min (ref >60)
GFR calc non Af Amer: >60 mL/min (ref >60)
Glucose, Bld: 101 mg/dL — ABNORMAL HIGH (ref 65–99)
Potassium: 4.4 mmol/L (ref 3.5–5.1)
Sodium: 135 mmol/L (ref 135–145)

## 2017-05-01 LAB — BODY FLUID CULTURE: Special Requests: NORMAL

## 2017-05-01 LAB — CULTURE, RESPIRATORY W GRAM STAIN: Special Requests: NORMAL

## 2017-05-01 MED ORDER — MUPIROCIN 2 % EX OINT
1.0000 "application " | TOPICAL_OINTMENT | Freq: Two times a day (BID) | CUTANEOUS | Status: DC
  Administered 2017-05-01 – 2017-05-04 (×6): 1 via NASAL
  Filled 2017-05-01 (×2): qty 22

## 2017-05-01 MED ORDER — CHLORHEXIDINE GLUCONATE CLOTH 2 % EX PADS
6.0000 | MEDICATED_PAD | Freq: Every day | CUTANEOUS | Status: DC
  Administered 2017-05-01 – 2017-05-03 (×2): 6 via TOPICAL

## 2017-05-01 NOTE — Care Management Note (Signed)
Case Management Note Dawn Gibbons RN, BSN Unit 2W-Case Manager-- [place] coverage [phone]  Patient Details  Name: Dawn Lin MRN: [id number] Date of Birth: 01-21-1941  Subjective/Objective:    Pt admitted s/p Video bronchoscopy, right video-assisted thoracoscopy, right upper lobectomy with lymph node dissection, and placement of On-Q.  On 04/20/17                Action/Plan: PTA pt lived at home independent- CM to follow for d/c needs  Expected Discharge Date:                  Expected Discharge Plan:  [place]  In-House Referral:  Clinical Social Work  Discharge planning Services  CM Consult  Post Acute Care Choice:  NA Choice offered to:  NA  DME Arranged:  N/A DME Agency:  NA  HH Arranged:  NA HH Agency:  NA  Status of Service:  Completed, signed off  If discussed at [employer] of Stay Meetings, dates discussed:  7/17, 7/19  Discharge Disposition:   Additional Comments:  05/01/17- 1100- Dawn Massi RN, CM- pt still with CT in place- per PA note pt would like to speak with family regarding d/c plan and see if she can return home with them and [facility] services vs SNF- spoke with pt at bedside who states son will be here this evening and she will speak with him then about SNF vs HH- discussed the differences between [facility] services and [facility] for rehab so pt would have info to share with son- also provided a list of [place] agencies for [facility] if pt decides she wants to return home- if home is the choice will need [place] orders and CM will need to f/u for agency of choice to provide services.   Dawn Roys, RN 04/27/2017, 12:26 PM--Continues with Chest tube in place. Pt with increased WBC/ temp >100. Continues on Empiric Zosyn- cultures pending. Plan for SNF once stable. CSW is following.     Dawn Patricia, RN 05/01/2017, 11:09 AM

## 2017-05-01 NOTE — Progress Notes (Signed)
Physical Therapy Treatment and Discharge Patient Details Name: Dawn Lin MRN: [id number] DOB: [birth year] Today's Date: 05/01/2017    History of Present Illness Patient is a 76 y/o female who presents with right lung mass s/p VATS and right upper lobectomy. Chest CT- small left PTX. s/p chest tube placement. PMH includes HLD, HTN, anxiety.    PT Comments    Patient has met all of her goals and is supervision for transfers and ambulation of 1000 feet with RW. Pt with no further acute PT needs identified. All education has been completed and the patient has no further questions. PT is signing off. Thank you.     Follow Up Recommendations  SNF;Supervision for mobility/OOB     Equipment Recommendations  Rolling walker with 5" wheels    Recommendations for Other Services OT consult     Precautions / Restrictions Precautions Precautions: Fall Precaution Comments: chest tube to water seal Restrictions Weight Bearing Restrictions: No    Mobility  Bed Mobility               General bed mobility comments: Up in chair upon arrival.   Transfers Overall transfer level: Needs assistance Equipment used: Rolling walker (2 wheeled) Transfers: Sit to/from Stand Sit to Stand: Supervision            Ambulation/Gait Ambulation/Gait assistance: Supervision Ambulation Distance (Feet): 1000 Feet Assistive device: Rolling walker (2 wheeled) Gait Pattern/deviations: Step-through pattern;Decreased stride length;Narrow base of support Gait velocity: decreased Gait velocity interpretation: Below normal speed for age/gender General Gait Details: slow, steady gait, good upright posture, safe RW management and no need for rest break         Balance   Sitting-balance support: Feet supported;No upper extremity supported Sitting balance-Leahy Scale: Good     Standing balance support: During functional activity Standing balance-Leahy Scale: Good Standing balance  comment: able to reach outside her BoS                            Cognition Arousal/Alertness: Awake/alert Behavior During Therapy: WFL for tasks assessed/performed Overall Cognitive Status: Within Functional Limits for tasks assessed                                           General Comments General comments (skin integrity, edema, etc.): HR 90-98 bpm, SaO2 on RA 95-100%O2 throughout session      Pertinent Vitals/Pain Pain Assessment: No/denies pain Pain Intervention(s): Monitored during session           PT Goals (current goals can now be found in the care plan section) Acute Rehab PT Goals PT Goal Formulation: With patient Time For Goal Achievement: 05/07/17 Potential to Achieve Goals: Good Progress towards PT goals: Goals met/education completed, patient discharged from PT    Frequency    Min 3X/week      PT Plan Current plan remains appropriate       AM-PAC PT "6 Clicks" Daily Activity  Outcome Measure  Difficulty turning over in bed (including adjusting bedclothes, sheets and blankets)?: None Difficulty moving from lying on back to sitting on the side of the bed? : A Little Difficulty sitting down on and standing up from a chair with arms (e.g., wheelchair, bedside commode, etc,.)?: None Help needed moving to and from a bed to chair (including a wheelchair)?: A Little Help needed  walking in hospital room?: A Little Help needed climbing 3-5 steps with a railing? : A Little 6 Click Score: 20    End of Session Equipment Utilized During Treatment: Gait belt Activity Tolerance: Patient tolerated treatment well Patient left: in chair;with call bell/phone within reach;with nursing/sitter in room;with family/visitor present Nurse Communication: Mobility status PT Visit Diagnosis: Pain;Unsteadiness on feet (R26.81) Pain - part of body:  (rib cage)     Time: 6986-1483 PT Time Calculation (min) (ACUTE ONLY): 21 min  Charges:   $Gait Training: 8-22 mins                    G Codes:       Dawn Lin PT, DPT Acute Rehabilitation  [phone] Pager [phone]     [place] 05/01/2017, 4:38 PM

## 2017-05-01 NOTE — Progress Notes (Signed)
Pharmacy Antibiotic Note  Dawn Lin is a 76 y.o. female admitted on 04/20/2017 with MRSA empyema .  Pharmacy has been consulted for Vancomycin dosing. WBC trending down to WNL. Renal function stable.   Vancomycin trough tonight is therapeutic at 16  Plan: -Cont vancomycin 500 mg IV q12h -Re-check trough as indicated  -Plan to transition to PO anti-biotics at discharge  Height: 4\' 11"  (149.9 cm) Weight: 133 lb 13.1 oz (60.7 kg) IBW/kg (Calculated) : 43.2  Temp (24hrs), Avg:98.6 F (37 C), Min:98.3 F (36.8 C), Max:98.7 F (37.1 C)   Recent Labs Lab 04/24/17 0340 04/25/17 0500 04/26/17 0337 04/27/17 0228 04/28/17 0301 04/29/17 0240 04/30/17 2235  WBC 9.3  --  19.1* 18.7* 12.2* 8.5  --   CREATININE 0.57 0.60 0.61 0.76 0.75 0.72  --   VANCOTROUGH  --   --   --   --   --   --  16    Estimated Creatinine Clearance: 48.2 mL/min (by C-G formula based on SCr of 0.72 mg/dL).    No Known Allergies   Dawn Lin 05/01/2017 1:06 AM

## 2017-05-01 NOTE — Progress Notes (Addendum)
      [address]       [place] [address]             [phone]      11 Days Post-Op Procedure(s) (LRB): VIDEO BRONCHOSCOPY (N/A) VIDEO ASSISTED THORACOSCOPY (VATS)/RIGHT UPPER LOBECTOMY (Right)   Subjective:  Dawn Lin has no new complaints.  She continues to have some chest tube discomfort.  She states she thinks she would rather go home with her children instead of a SNF, but she needs to speak with her children this evening to see if that would be possible.  Objective: Vital signs in last 24 hours: Temp:  [98.2 F (36.8 C)-98.7 F (37.1 C)] 98.2 F (36.8 C) (07/20 0400) Cardiac Rhythm: Normal sinus rhythm (07/20 0716) BP: (116)/(57) 116/57 (07/19 1045)  Intake/Output from previous day: 07/19 0701 - 07/20 0700 In: 1020 [P.O.:720; IV Piggyback:300] Out: 1414 [Urine:1400; Chest Tube:14]  General appearance: alert, cooperative and no distress Heart: regular rate and rhythm Lungs: diminished breath sounds right base Abdomen: soft, non-tender; bowel sounds normal; no masses,  no organomegaly Extremities: extremities normal, atraumatic, no cyanosis or edema Wound: clean and dry  Lab Results:  Recent Labs  04/29/17 0240  WBC 8.5  HGB 7.4*  HCT 22.3*  PLT 293   BMET:  Recent Labs  04/29/17 0240 05/01/17 0226  NA 135 135  K 4.2 4.4  CL 99* 101  CO2 27 25  GLUCOSE 108* 101*  BUN 10 9  CREATININE 0.72 0.73  CALCIUM 8.6* 8.8*    PT/INR: No results for input(s): LABPROT, INR in the last 72 hours. ABG    Component Value Date/Time   PHART 7.405 04/21/2017 0410   HCO3 25.1 04/21/2017 0410   O2SAT 95.3 04/21/2017 0410   CBG (last 3)  No results for input(s): GLUCAP in the last 72 hours.  Assessment/Plan: S/P Procedure(s) (LRB): VIDEO BRONCHOSCOPY (N/A) VIDEO ASSISTED THORACOSCOPY (VATS)/RIGHT UPPER LOBECTOMY (Right)  1. Chest tube- output remains minimal, it is not serous- possibly remove chest tube today 2. Pulm- CXR remains  stable, no pneumothorax, continued/stable appearance of sub q emphysema 3. ID- Empyema, +MRSA on cultures, continue Vanc for now.. At d/c will need 2 weeks of Doxycycline 100 mg BID.Marland KitchenMarland Kitchen Appreciate ID assistance 4. Chronic Anemia- Thalassemia 5. Deconditioning- home vs. SNF.... Have placed orders for H/H, Home PT.... Patient if patients children unable to provide post operative care she will need to go to a SNF,  6. Dispo- patient stable, chest tube output remains minimal, fluid is now serous... Possibly remove chest tube today if okay with Dr. Servando Snare, continue ABX   LOS: 11 days    Ellwood Handler 05/01/2017  Will d/c chest tube today Poss home 2-3 days  She prefers to go home with pt, home nursing  Home on two weeks po antibiotics per id recommendation  I have seen and examined Dawn Lin and agree with the above assessment  and plan.  Grace Isaac MD Beeper [phone] Office [phone] 05/01/2017 12:09 PM

## 2017-05-02 ENCOUNTER — Inpatient Hospital Stay (HOSPITAL_COMMUNITY): Payer: Medicare HMO

## 2017-05-02 NOTE — Progress Notes (Addendum)
[address]       [place] [address]             [phone]      12 Days Post-Op Procedure(s) (LRB): VIDEO BRONCHOSCOPY (N/A) VIDEO ASSISTED THORACOSCOPY (VATS)/RIGHT UPPER LOBECTOMY (Right) Subjective: Feels pretty well overall  Objective: Vital signs in last 24 hours: Temp:  [98.1 F (36.7 C)-98.6 F (37 C)] 98.6 F (37 C) (07/21 0825) Pulse Rate:  [64-83] 75 (07/21 0825) Cardiac Rhythm: Normal sinus rhythm (07/21 0724) Resp:  [17-21] 21 (07/21 0825) BP: (125-153)/(57-71) 139/61 (07/21 0825) SpO2:  [96 %-99 %] 99 % (07/21 0825)  Hemodynamic parameters for last 24 hours:    Intake/Output from previous day: 07/20 0701 - 07/21 0700 In: -  Out: 850 [Urine:850] Intake/Output this shift: No intake/output data recorded.  General appearance: alert, cooperative and no distress Heart: regular rate and rhythm and soft systolic murmur Lungs: clear to auscultation bilaterally Abdomen: benign Extremities: no edema or calf tenderness Wound: incis healing well  Lab Results: No results for input(s): WBC, HGB, HCT, PLT in the last 72 hours. BMET:  Recent Labs  05/01/17 0226  NA 135  K 4.4  CL 101  CO2 25  GLUCOSE 101*  BUN 9  CREATININE 0.73  CALCIUM 8.8*    PT/INR: No results for input(s): LABPROT, INR in the last 72 hours. ABG    Component Value Date/Time   PHART 7.405 04/21/2017 0410   HCO3 25.1 04/21/2017 0410   O2SAT 95.3 04/21/2017 0410   CBG (last 3)  No results for input(s): GLUCAP in the last 72 hours.  Meds Scheduled Meds: . amLODipine  10 mg Oral Daily  . aspirin EC  81 mg Oral Daily  . bisacodyl  10 mg Oral Daily  . Chlorhexidine Gluconate Cloth  6 each Topical Q0600  . escitalopram  10 mg Oral Daily  . guaiFENesin  600 mg Oral BID  . hydrochlorothiazide  25 mg Oral Daily  . mupirocin ointment  1 application Nasal BID  . pantoprazole  40 mg Oral Daily  . potassium chloride  40 mEq Oral BID  . senna-docusate  1  tablet Oral QHS  . sodium chloride flush  10-40 mL Intracatheter Q12H   Continuous Infusions: . potassium chloride 10 mEq (04/24/17 0837)  . sodium chloride    . vancomycin Stopped (05/02/17 0023)   PRN Meds:.acetaminophen, albuterol, lactulose, potassium chloride, sodium chloride flush, traMADol  Xrays Dg Chest 2 View  Result Date: 05/02/2017 CLINICAL DATA:  Chest wall emphysema. EXAM: CHEST  2 VIEW COMPARISON:  Yesterday FINDINGS: Stable extensive chest wall emphysema on the right. Linear opacity at the right apex, site of previous chest tube. No convincing or enlarging pneumothorax. Postoperative changes on the right. The left lung is clear. Normal heart size. IMPRESSION: No convincing or enlarging pneumothorax. Stable chest wall emphysema. Electronically Signed   By: Monte Fantasia M.D.   On: 05/02/2017 08:18   Dg Chest Port 1 View  Result Date: 05/01/2017 CLINICAL DATA:  Chest tube removal. EXAM: PORTABLE CHEST 1 VIEW COMPARISON:  05/01/2017 FINDINGS: Cardiomediastinal silhouette is normal. Mediastinal contours appear intact. Stable postsurgical changes in the right hemithorax. No radiographically apparent pneumothorax. Right-sided chest tube has been removed. Osseous structures are without acute abnormality. Persistent right chest wall soft tissue emphysema. IMPRESSION: No radiographically apparent pneumothorax post removal of right chest tube. Stable postsurgical changes in the right hemithorax. Electronically Signed   By: Linwood Dibbles.D.  On: 05/01/2017 15:42   Dg Chest Port 1 View  Result Date: 05/01/2017 CLINICAL DATA:  Subcutaneous emphysema RIGHT chest wall, RIGHT chest tube, hypertension EXAM: PORTABLE CHEST 1 VIEW COMPARISON:  Portable exam 0642 hours compared to 04/30/2017 FINDINGS: RIGHT thoracostomy tube unchanged. Persistent significant RIGHT chest wall emphysema. Normal heart size, mediastinal contours and pulmonary vascularity. Bronchitic changes with volume loss  in the RIGHT chest and prominent RIGHT perihilar markings unchanged since previous exam consistent with recent RIGHT upper lobectomy. No acute infiltrate, pleural effusion or pneumothorax. IMPRESSION: Postsurgical changes of the RIGHT hemithorax without pneumothorax. Persistent RIGHT chest wall emphysema. Electronically Signed   By: Lavonia Dana M.D.   On: 05/01/2017 07:59    Assessment/Plan: S/P Procedure(s) (LRB): VIDEO BRONCHOSCOPY (N/A) VIDEO ASSISTED THORACOSCOPY (VATS)/RIGHT UPPER LOBECTOMY (Right) 1 doing well 2 conts routine pulm toilet and activity progression 3 a little hypertensive at times- she is on home meds- continue to observe, mostly controlled 4 CXR is stable 5 no new labs 6 hopefully home in am  LOS: 12 days    GOLD,WAYNE E 05/02/2017   I have seen and examined the patient and agree with the assessment and plan as outlined.  Tentatively plan d/c home in am tomorrow if CXR looks good  Rexene Alberts, MD 05/02/2017 11:09 AM

## 2017-05-03 ENCOUNTER — Inpatient Hospital Stay (HOSPITAL_COMMUNITY): Payer: Medicare HMO

## 2017-05-03 NOTE — Progress Notes (Addendum)
[address]       [place] [address]             [phone]      13 Days Post-Op Procedure(s) (LRB): VIDEO BRONCHOSCOPY (N/A) VIDEO ASSISTED THORACOSCOPY (VATS)/RIGHT UPPER LOBECTOMY (Right) Subjective: Says her plan currently is discharge with family and  HHRN/HHPT  Objective: Vital signs in last 24 hours: Temp:  [97.4 F (36.3 C)-98.6 F (37 C)] 98.6 F (37 C) (07/22 0944) Pulse Rate:  [73-89] 73 (07/22 0944) Cardiac Rhythm: Normal sinus rhythm (07/22 0718) Resp:  [19-24] 24 (07/22 0944) BP: (112-141)/(53-95) 134/53 (07/22 0944) SpO2:  [94 %-100 %] 94 % (07/22 0944)  Hemodynamic parameters for last 24 hours:    Intake/Output from previous day: 07/21 0701 - 07/22 0700 In: 500 [P.O.:400; IV Piggyback:100] Out: 1175 [Urine:1175] Intake/Output this shift: No intake/output data recorded.  General appearance: alert, cooperative and no distress Heart: regular rate and rhythm Lungs: mildly dim in bases Abdomen: benign Extremities: no edema or calf tenderness Wound: incis healing well  Lab Results: No results for input(s): WBC, HGB, HCT, PLT in the last 72 hours. BMET:  Recent Labs  05/01/17 0226  NA 135  K 4.4  CL 101  CO2 25  GLUCOSE 101*  BUN 9  CREATININE 0.73  CALCIUM 8.8*    PT/INR: No results for input(s): LABPROT, INR in the last 72 hours. ABG    Component Value Date/Time   PHART 7.405 04/21/2017 0410   HCO3 25.1 04/21/2017 0410   O2SAT 95.3 04/21/2017 0410   CBG (last 3)  No results for input(s): GLUCAP in the last 72 hours.  Meds Scheduled Meds: . amLODipine  10 mg Oral Daily  . aspirin EC  81 mg Oral Daily  . bisacodyl  10 mg Oral Daily  . Chlorhexidine Gluconate Cloth  6 each Topical Q0600  . escitalopram  10 mg Oral Daily  . guaiFENesin  600 mg Oral BID  . hydrochlorothiazide  25 mg Oral Daily  . mupirocin ointment  1 application Nasal BID  . pantoprazole  40 mg Oral Daily  . potassium chloride  40 mEq Oral  BID  . senna-docusate  1 tablet Oral QHS  . sodium chloride flush  10-40 mL Intracatheter Q12H   Continuous Infusions: . potassium chloride 10 mEq (04/24/17 0837)  . sodium chloride    . vancomycin Stopped (05/03/17 0131)   PRN Meds:.acetaminophen, albuterol, lactulose, potassium chloride, sodium chloride flush, traMADol  Xrays Dg Chest 2 View  Result Date: 05/03/2017 CLINICAL DATA:  Post RIGHT upper lobectomy, hypertension, dyspnea EXAM: CHEST  2 VIEW COMPARISON:  05/02/2017 FINDINGS: Upper normal heart size. Stable mediastinal contours and pulmonary vascularity. Volume loss and postsurgical changes in the RIGHT hemithorax. Bronchitic changes with atelectasis at both lung bases, greater on RIGHT, stable. No acute infiltrate, pleural effusion or pneumothorax. Persistent RIGHT chest wall emphysema. IMPRESSION: No interval change. Electronically Signed   By: Lavonia Dana M.D.   On: 05/03/2017 08:17   Dg Chest 2 View  Result Date: 05/02/2017 CLINICAL DATA:  Chest wall emphysema. EXAM: CHEST  2 VIEW COMPARISON:  Yesterday FINDINGS: Stable extensive chest wall emphysema on the right. Linear opacity at the right apex, site of previous chest tube. No convincing or enlarging pneumothorax. Postoperative changes on the right. The left lung is clear. Normal heart size. IMPRESSION: No convincing or enlarging pneumothorax. Stable chest wall emphysema. Electronically Signed   By: Neva Seat.D.  On: 05/02/2017 08:18   Dg Chest Port 1 View  Result Date: 05/01/2017 CLINICAL DATA:  Chest tube removal. EXAM: PORTABLE CHEST 1 VIEW COMPARISON:  05/01/2017 FINDINGS: Cardiomediastinal silhouette is normal. Mediastinal contours appear intact. Stable postsurgical changes in the right hemithorax. No radiographically apparent pneumothorax. Right-sided chest tube has been removed. Osseous structures are without acute abnormality. Persistent right chest wall soft tissue emphysema. IMPRESSION: No radiographically  apparent pneumothorax post removal of right chest tube. Stable postsurgical changes in the right hemithorax. Electronically Signed   By: Fidela Salisbury M.D.   On: 05/01/2017 15:42    Assessment/Plan: S/P Procedure(s) (LRB): VIDEO BRONCHOSCOPY (N/A) VIDEO ASSISTED THORACOSCOPY (VATS)/RIGHT UPPER LOBECTOMY (Right)   1 conts to progress well 2 CXR is stable 3 home with HH probably tomorrow 4 MRSA from pleural culture- plan for duration of vanco or po abx choice- sens to po  clinda/rifampin- she is afebrile, vanco since 04/28/17   LOS: 13 days    GOLD,WAYNE E 05/03/2017  I have seen and examined the patient and agree with the assessment and plan as outlined.  Change antibiotics to oral clindamycin x 7 days.  Ready to d/c once appropriate arrangements in place.  Rexene Alberts, MD 05/03/2017 10:42 AM

## 2017-05-04 MED ORDER — TRAMADOL HCL 50 MG PO TABS: 50.0000 mg | ORAL_TABLET | Freq: Four times a day (QID) | ORAL | 0 refills | Status: DC | PRN

## 2017-05-04 MED ORDER — DOXYCYCLINE HYCLATE 100 MG PO CAPS: 100.0000 mg | ORAL_CAPSULE | Freq: Two times a day (BID) | ORAL | 0 refills | Status: DC

## 2017-05-04 MED ORDER — ACETAMINOPHEN 325 MG PO TABS: 650.0000 mg | ORAL_TABLET | Freq: Four times a day (QID) | ORAL | Status: DC | PRN

## 2017-05-04 NOTE — Care Management Note (Addendum)
Case Management Note  Patient Details  Name: Dawn Lin MRN: [id number] Date of Birth: 1941-01-11  Subjective/Objective:       CM following for progression and d/c planning.              Action/Plan: 05/04/2017 Met with pt who has reviewed  [facility] provider list and selected [facility] for [facility] and [place] services. Pt also requesting a shower chair. Per pt has a rolling walker at home and will borrow a 3:1 from her church. No other needs identified. [place] notified of pt needs and possible d/c later today Will arrange any further needs as identified.  Pt insurance will not cover the shower chair, therefore the pt will pick one up later.   Expected Discharge Date:     05/04/2017             Expected Discharge Plan:  [place]  In-House Referral:  Clinical Social Work  Discharge planning Services  CM Consult  Post Acute Care Choice:  Durable Medical Equipment, Home Health Choice offered to:  Patient  DME Arranged:  Pt will purchase shower chair after d/c as insurance will not cover.  DME Agency:      HH Arranged:  RN, PT HH Agency:  [place]  Status of Service:  Completed, signed off  If discussed at [place] of Stay Meetings, dates discussed:    Additional Comments:  Adron Bene, RN 05/04/2017, 10:57 AM

## 2017-05-04 NOTE — Progress Notes (Signed)
      [address]       ,[place] [address]             [phone]      14 Days Post-Op Procedure(s) (LRB): VIDEO BRONCHOSCOPY (N/A) VIDEO ASSISTED THORACOSCOPY (VATS)/RIGHT UPPER LOBECTOMY (Right)   Subjective:  No new complaints.  Excited to be going home.   H/H arrangements have been made  Objective: Vital signs in last 24 hours: Temp:  [97.8 F (36.6 C)-98.8 F (37.1 C)] 98.4 F (36.9 C) (07/23 1036) Pulse Rate:  [64-85] 64 (07/23 0416) Cardiac Rhythm: Normal sinus rhythm (07/23 0701) Resp:  [18-22] 18 (07/23 0416) BP: (101-149)/(53-57) 133/55 (07/23 1036) SpO2:  [95 %-98 %] 95 % (07/23 0416) Weight:  [129 lb 3.2 oz (58.6 kg)] 129 lb 3.2 oz (58.6 kg) (07/23 0416)  Intake/Output from previous day: 07/22 0701 - 07/23 0700 In: 250 [P.O.:150; IV Piggyback:100] Out: 1650 [Urine:1650] Intake/Output this shift: Total I/O In: 120 [P.O.:120] Out: 300 [Urine:300]  General appearance: alert, cooperative and no distress Heart: regular rate and rhythm Lungs: clear to auscultation bilaterally Abdomen: soft, non-tender; bowel sounds normal; no masses,  no organomegaly Extremities: extremities normal, atraumatic, no cyanosis or edema Wound: some yellow sloughing tissue present  Lab Results: No results for input(s): WBC, HGB, HCT, PLT in the last 72 hours. BMET: No results for input(s): NA, K, CL, CO2, GLUCOSE, BUN, CREATININE, CALCIUM in the last 72 hours.  PT/INR: No results for input(s): LABPROT, INR in the last 72 hours. ABG    Component Value Date/Time   PHART 7.405 04/21/2017 0410   HCO3 25.1 04/21/2017 0410   O2SAT 95.3 04/21/2017 0410   CBG (last 3)  No results for input(s): GLUCAP in the last 72 hours.  Assessment/Plan: S/P Procedure(s) (LRB): VIDEO BRONCHOSCOPY (N/A) VIDEO ASSISTED THORACOSCOPY (VATS)/RIGHT UPPER LOBECTOMY (Right)  1. MRSA Empyema post VATS procedure- patient remains afebrile.. Will continue Doxycycline 100 mg BID for 2  weeks per ID recommendations 2. CV- hemodynamically stable- on home Norvasc, HCTZ 3. Dispo- patient stable, will d/c home today   LOS: 14 days    Dawn Lin 05/04/2017

## 2017-05-04 NOTE — Progress Notes (Signed)
Ivan Anchors to be D/C'd Home per MD order. Discussed with the patient and all questions fully answered.    VVS, Skin clean, dry and intact without evidence of skin break down, no evidence of skin tears noted.  IV catheter discontinued intact. Site without signs and symptoms of complications. Dressing and pressure applied.  An After Visit Summary was printed and given to the patient.  Patient escorted via [place], and D/C home via private auto.  Cyndra Numbers  05/04/2017 14:30

## 2017-05-04 NOTE — Care Management Important Message (Signed)
Important Message  Patient Details  Name: Dawn Lin MRN: [id number] Date of Birth: [birth year]   Medicare Important Message Given:  Yes    Leba Tibbitts, Rory Percy, RN 05/04/2017, 1:37 PM

## 2017-05-05 DIAGNOSIS — M810 Age-related osteoporosis without current pathological fracture: Secondary | ICD-10-CM | POA: Diagnosis not present

## 2017-05-05 DIAGNOSIS — I10 Essential (primary) hypertension: Secondary | ICD-10-CM | POA: Diagnosis not present

## 2017-05-05 DIAGNOSIS — Z48813 Encounter for surgical aftercare following surgery on the respiratory system: Secondary | ICD-10-CM | POA: Diagnosis not present

## 2017-05-05 DIAGNOSIS — M549 Dorsalgia, unspecified: Secondary | ICD-10-CM | POA: Diagnosis not present

## 2017-05-05 DIAGNOSIS — Z7982 Long term (current) use of aspirin: Secondary | ICD-10-CM | POA: Diagnosis not present

## 2017-05-05 DIAGNOSIS — Z79891 Long term (current) use of opiate analgesic: Secondary | ICD-10-CM | POA: Diagnosis not present

## 2017-05-05 DIAGNOSIS — S81802D Unspecified open wound, left lower leg, subsequent encounter: Secondary | ICD-10-CM | POA: Diagnosis not present

## 2017-05-05 DIAGNOSIS — E785 Hyperlipidemia, unspecified: Secondary | ICD-10-CM | POA: Diagnosis not present

## 2017-05-05 DIAGNOSIS — C3411 Malignant neoplasm of upper lobe, right bronchus or lung: Secondary | ICD-10-CM | POA: Diagnosis not present

## 2017-05-05 DIAGNOSIS — S81801D Unspecified open wound, right lower leg, subsequent encounter: Secondary | ICD-10-CM | POA: Diagnosis not present

## 2017-05-06 DIAGNOSIS — S81801D Unspecified open wound, right lower leg, subsequent encounter: Secondary | ICD-10-CM | POA: Diagnosis not present

## 2017-05-06 DIAGNOSIS — C3411 Malignant neoplasm of upper lobe, right bronchus or lung: Secondary | ICD-10-CM | POA: Diagnosis not present

## 2017-05-06 DIAGNOSIS — Z7982 Long term (current) use of aspirin: Secondary | ICD-10-CM | POA: Diagnosis not present

## 2017-05-06 DIAGNOSIS — Z79891 Long term (current) use of opiate analgesic: Secondary | ICD-10-CM | POA: Diagnosis not present

## 2017-05-06 DIAGNOSIS — M810 Age-related osteoporosis without current pathological fracture: Secondary | ICD-10-CM | POA: Diagnosis not present

## 2017-05-06 DIAGNOSIS — I10 Essential (primary) hypertension: Secondary | ICD-10-CM | POA: Diagnosis not present

## 2017-05-06 DIAGNOSIS — E785 Hyperlipidemia, unspecified: Secondary | ICD-10-CM | POA: Diagnosis not present

## 2017-05-06 DIAGNOSIS — Z48813 Encounter for surgical aftercare following surgery on the respiratory system: Secondary | ICD-10-CM | POA: Diagnosis not present

## 2017-05-06 DIAGNOSIS — M549 Dorsalgia, unspecified: Secondary | ICD-10-CM | POA: Diagnosis not present

## 2017-05-06 DIAGNOSIS — S81802D Unspecified open wound, left lower leg, subsequent encounter: Secondary | ICD-10-CM | POA: Diagnosis not present

## 2017-05-07 DIAGNOSIS — Z7982 Long term (current) use of aspirin: Secondary | ICD-10-CM | POA: Diagnosis not present

## 2017-05-07 DIAGNOSIS — M810 Age-related osteoporosis without current pathological fracture: Secondary | ICD-10-CM | POA: Diagnosis not present

## 2017-05-07 DIAGNOSIS — I10 Essential (primary) hypertension: Secondary | ICD-10-CM | POA: Diagnosis not present

## 2017-05-07 DIAGNOSIS — S81801D Unspecified open wound, right lower leg, subsequent encounter: Secondary | ICD-10-CM | POA: Diagnosis not present

## 2017-05-07 DIAGNOSIS — Z48813 Encounter for surgical aftercare following surgery on the respiratory system: Secondary | ICD-10-CM | POA: Diagnosis not present

## 2017-05-07 DIAGNOSIS — E785 Hyperlipidemia, unspecified: Secondary | ICD-10-CM | POA: Diagnosis not present

## 2017-05-07 DIAGNOSIS — Z79891 Long term (current) use of opiate analgesic: Secondary | ICD-10-CM | POA: Diagnosis not present

## 2017-05-07 DIAGNOSIS — M549 Dorsalgia, unspecified: Secondary | ICD-10-CM | POA: Diagnosis not present

## 2017-05-07 DIAGNOSIS — S81802D Unspecified open wound, left lower leg, subsequent encounter: Secondary | ICD-10-CM | POA: Diagnosis not present

## 2017-05-07 DIAGNOSIS — C3411 Malignant neoplasm of upper lobe, right bronchus or lung: Secondary | ICD-10-CM | POA: Diagnosis not present

## 2017-05-08 DIAGNOSIS — S81802D Unspecified open wound, left lower leg, subsequent encounter: Secondary | ICD-10-CM | POA: Diagnosis not present

## 2017-05-08 DIAGNOSIS — Z7982 Long term (current) use of aspirin: Secondary | ICD-10-CM | POA: Diagnosis not present

## 2017-05-08 DIAGNOSIS — E785 Hyperlipidemia, unspecified: Secondary | ICD-10-CM | POA: Diagnosis not present

## 2017-05-08 DIAGNOSIS — M549 Dorsalgia, unspecified: Secondary | ICD-10-CM | POA: Diagnosis not present

## 2017-05-08 DIAGNOSIS — Z48813 Encounter for surgical aftercare following surgery on the respiratory system: Secondary | ICD-10-CM | POA: Diagnosis not present

## 2017-05-08 DIAGNOSIS — I10 Essential (primary) hypertension: Secondary | ICD-10-CM | POA: Diagnosis not present

## 2017-05-08 DIAGNOSIS — Z79891 Long term (current) use of opiate analgesic: Secondary | ICD-10-CM | POA: Diagnosis not present

## 2017-05-08 DIAGNOSIS — C3411 Malignant neoplasm of upper lobe, right bronchus or lung: Secondary | ICD-10-CM | POA: Diagnosis not present

## 2017-05-08 DIAGNOSIS — S81801D Unspecified open wound, right lower leg, subsequent encounter: Secondary | ICD-10-CM | POA: Diagnosis not present

## 2017-05-08 DIAGNOSIS — M810 Age-related osteoporosis without current pathological fracture: Secondary | ICD-10-CM | POA: Diagnosis not present

## 2017-05-11 DIAGNOSIS — E785 Hyperlipidemia, unspecified: Secondary | ICD-10-CM | POA: Diagnosis not present

## 2017-05-11 DIAGNOSIS — I10 Essential (primary) hypertension: Secondary | ICD-10-CM | POA: Diagnosis not present

## 2017-05-11 DIAGNOSIS — Z48813 Encounter for surgical aftercare following surgery on the respiratory system: Secondary | ICD-10-CM | POA: Diagnosis not present

## 2017-05-11 DIAGNOSIS — Z79891 Long term (current) use of opiate analgesic: Secondary | ICD-10-CM | POA: Diagnosis not present

## 2017-05-11 DIAGNOSIS — C3411 Malignant neoplasm of upper lobe, right bronchus or lung: Secondary | ICD-10-CM | POA: Diagnosis not present

## 2017-05-11 DIAGNOSIS — S81802D Unspecified open wound, left lower leg, subsequent encounter: Secondary | ICD-10-CM | POA: Diagnosis not present

## 2017-05-11 DIAGNOSIS — Z7982 Long term (current) use of aspirin: Secondary | ICD-10-CM | POA: Diagnosis not present

## 2017-05-11 DIAGNOSIS — M549 Dorsalgia, unspecified: Secondary | ICD-10-CM | POA: Diagnosis not present

## 2017-05-11 DIAGNOSIS — M810 Age-related osteoporosis without current pathological fracture: Secondary | ICD-10-CM | POA: Diagnosis not present

## 2017-05-11 DIAGNOSIS — S81801D Unspecified open wound, right lower leg, subsequent encounter: Secondary | ICD-10-CM | POA: Diagnosis not present

## 2017-05-12 DIAGNOSIS — I10 Essential (primary) hypertension: Secondary | ICD-10-CM | POA: Diagnosis not present

## 2017-05-12 DIAGNOSIS — S81801D Unspecified open wound, right lower leg, subsequent encounter: Secondary | ICD-10-CM | POA: Diagnosis not present

## 2017-05-12 DIAGNOSIS — S81802D Unspecified open wound, left lower leg, subsequent encounter: Secondary | ICD-10-CM | POA: Diagnosis not present

## 2017-05-12 DIAGNOSIS — M810 Age-related osteoporosis without current pathological fracture: Secondary | ICD-10-CM | POA: Diagnosis not present

## 2017-05-12 DIAGNOSIS — Z7982 Long term (current) use of aspirin: Secondary | ICD-10-CM | POA: Diagnosis not present

## 2017-05-12 DIAGNOSIS — Z48813 Encounter for surgical aftercare following surgery on the respiratory system: Secondary | ICD-10-CM | POA: Diagnosis not present

## 2017-05-12 DIAGNOSIS — C3411 Malignant neoplasm of upper lobe, right bronchus or lung: Secondary | ICD-10-CM | POA: Diagnosis not present

## 2017-05-12 DIAGNOSIS — M549 Dorsalgia, unspecified: Secondary | ICD-10-CM | POA: Diagnosis not present

## 2017-05-12 DIAGNOSIS — Z79891 Long term (current) use of opiate analgesic: Secondary | ICD-10-CM | POA: Diagnosis not present

## 2017-05-12 DIAGNOSIS — E785 Hyperlipidemia, unspecified: Secondary | ICD-10-CM | POA: Diagnosis not present

## 2017-05-13 ENCOUNTER — Other Ambulatory Visit: Payer: Self-pay | Admitting: Cardiothoracic Surgery

## 2017-05-13 DIAGNOSIS — Z79891 Long term (current) use of opiate analgesic: Secondary | ICD-10-CM | POA: Diagnosis not present

## 2017-05-13 DIAGNOSIS — M810 Age-related osteoporosis without current pathological fracture: Secondary | ICD-10-CM | POA: Diagnosis not present

## 2017-05-13 DIAGNOSIS — S81801D Unspecified open wound, right lower leg, subsequent encounter: Secondary | ICD-10-CM | POA: Diagnosis not present

## 2017-05-13 DIAGNOSIS — S81802D Unspecified open wound, left lower leg, subsequent encounter: Secondary | ICD-10-CM | POA: Diagnosis not present

## 2017-05-13 DIAGNOSIS — M549 Dorsalgia, unspecified: Secondary | ICD-10-CM | POA: Diagnosis not present

## 2017-05-13 DIAGNOSIS — C3490 Malignant neoplasm of unspecified part of unspecified bronchus or lung: Secondary | ICD-10-CM

## 2017-05-13 DIAGNOSIS — E785 Hyperlipidemia, unspecified: Secondary | ICD-10-CM | POA: Diagnosis not present

## 2017-05-13 DIAGNOSIS — I10 Essential (primary) hypertension: Secondary | ICD-10-CM | POA: Diagnosis not present

## 2017-05-13 DIAGNOSIS — Z7982 Long term (current) use of aspirin: Secondary | ICD-10-CM | POA: Diagnosis not present

## 2017-05-13 DIAGNOSIS — C3411 Malignant neoplasm of upper lobe, right bronchus or lung: Secondary | ICD-10-CM | POA: Diagnosis not present

## 2017-05-13 DIAGNOSIS — Z48813 Encounter for surgical aftercare following surgery on the respiratory system: Secondary | ICD-10-CM | POA: Diagnosis not present

## 2017-05-14 ENCOUNTER — Ambulatory Visit
Admission: RE | Admit: 2017-05-14 | Discharge: 2017-05-14 | Disposition: A | Discharge status: Home / Self Care | Payer: Medicare HMO | Source: Ambulatory Visit | Attending: Cardiothoracic Surgery | Admitting: Cardiothoracic Surgery

## 2017-05-14 ENCOUNTER — Encounter: Payer: Self-pay | Admitting: Cardiothoracic Surgery

## 2017-05-14 ENCOUNTER — Ambulatory Visit (INDEPENDENT_AMBULATORY_CARE_PROVIDER_SITE_OTHER): Payer: Self-pay | Admitting: Cardiothoracic Surgery

## 2017-05-14 VITALS — BP 136/72 | HR 82 | Resp 16 | Ht 59.0 in | Wt 128.0 lb

## 2017-05-14 DIAGNOSIS — Z48813 Encounter for surgical aftercare following surgery on the respiratory system: Secondary | ICD-10-CM | POA: Diagnosis not present

## 2017-05-14 DIAGNOSIS — Z902 Acquired absence of lung [part of]: Secondary | ICD-10-CM

## 2017-05-14 DIAGNOSIS — S81802D Unspecified open wound, left lower leg, subsequent encounter: Secondary | ICD-10-CM | POA: Diagnosis not present

## 2017-05-14 DIAGNOSIS — C3490 Malignant neoplasm of unspecified part of unspecified bronchus or lung: Secondary | ICD-10-CM

## 2017-05-14 DIAGNOSIS — J439 Emphysema, unspecified: Secondary | ICD-10-CM | POA: Diagnosis not present

## 2017-05-14 DIAGNOSIS — Z7982 Long term (current) use of aspirin: Secondary | ICD-10-CM | POA: Diagnosis not present

## 2017-05-14 DIAGNOSIS — I10 Essential (primary) hypertension: Secondary | ICD-10-CM | POA: Diagnosis not present

## 2017-05-14 DIAGNOSIS — S81801D Unspecified open wound, right lower leg, subsequent encounter: Secondary | ICD-10-CM | POA: Diagnosis not present

## 2017-05-14 DIAGNOSIS — M810 Age-related osteoporosis without current pathological fracture: Secondary | ICD-10-CM | POA: Diagnosis not present

## 2017-05-14 DIAGNOSIS — Z79891 Long term (current) use of opiate analgesic: Secondary | ICD-10-CM | POA: Diagnosis not present

## 2017-05-14 DIAGNOSIS — E785 Hyperlipidemia, unspecified: Secondary | ICD-10-CM | POA: Diagnosis not present

## 2017-05-14 DIAGNOSIS — M549 Dorsalgia, unspecified: Secondary | ICD-10-CM | POA: Diagnosis not present

## 2017-05-14 DIAGNOSIS — C3411 Malignant neoplasm of upper lobe, right bronchus or lung: Secondary | ICD-10-CM

## 2017-05-14 NOTE — Progress Notes (Signed)
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 SURGICAL PROCEDURE:  Video bronchoscopy, right video-assisted thoracoscopy, right upper lobectomy with lymph node dissection, and placement of On-Q.  Cancer Staging Malignant neoplasm of right upper lobe of lung (HCC) Staging form: Lung, AJCC 8th Edition - Pathologic stage from 04/21/2017: Stage IA2 (pT1b, pN0, cM0) - Signed by Grace Isaac, MD on 04/22/2017  History of Present Illness:     Patient returns to office after recent right upper lobectomy for stage Ia adenocarcinoma of the lung. The patient had a slow postoperative course, complicated by pleural fluid infection which required IV antibiotics. Ultimately this totally cleared and the patient's chest tubes were removed. She's had no fever chills since discharge. She's been working with physical therapy and increase her activity appropriately. She's not hampered by shortness of breath.     Past Medical History:  Diagnosis Date  . Anxiety   . Dyspnea    with exertion  . 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 Family history of adverse reaction to anesthesia    Children - N/V  . History of blood transfusion   . Hyperlipidemia   . Other osteoporosis   . Other thalassemia ([place])      History  Smoking Status  . Never Smoker  Smokeless Tobacco  . Never Used    History  Alcohol Use No     No Known Allergies  Current Outpatient Prescriptions  Medication Sig Dispense Refill  . acetaminophen (TYLENOL) 325 MG tablet Take 2 tablets (650 mg total) by mouth  every 6 (six) hours as needed for mild pain.    Marland Kitchen amLODipine (NORVASC) 10 MG tablet Take 10 mg by mouth daily.    Marland Kitchen aspirin EC 81 MG tablet Take 81 mg by mouth daily.    . benzonatate (TESSALON) 200 MG capsule Take 200 mg by mouth 3 (three) times daily as needed for cough.    . Calcium-Phosphorus-Vitamin D [id number] MG-MG-UNIT CHEW Chew 1 each by mouth 2 (two) times daily.     Marland Kitchen doxycycline (VIBRAMYCIN) 100 MG capsule Take 1 capsule (100 mg total) by mouth 2 (two) times daily. For 14 days 28 capsule 0  . escitalopram (LEXAPRO) 10 MG tablet Take 10 mg by mouth daily.    . hydrochlorothiazide (HYDRODIURIL) 25 MG tablet Take 1 tablet (25 mg total) by mouth daily. 93 tablet 4  . Krill Oil 350 MG CAPS Take 1 capsule by mouth daily.    . Multiple Vitamin (MULTIVITAMIN WITH MINERALS) TABS tablet Take 1 tablet by mouth daily.    . traMADol (ULTRAM) 50 MG tablet Take 1-2 tablets (50-100 mg total) by mouth every 6 (six) hours as needed (mild pain). 30 tablet 0   No current facility-administered medications for this visit.        Physical Exam: BP 136/72 (BP Location: Right Arm, Patient Position: Sitting, Cuff Size: Large)   Pulse 82   Resp 16   Ht 4\' 11"  (1.499 m)   Wt 128 lb (  58.1 kg)   LMP  (LMP Unknown)   SpO2 99% Comment: ON RA  BMI 25.85 kg/m   General appearance: alert and cooperative Neurologic: intact Heart: regular rate and rhythm, S1, S2 normal, no murmur, click, rub or gallop Lungs: clear to auscultation bilaterally Abdomen: soft, non-tender; bowel sounds normal; no masses,  no organomegaly Extremities: extremities normal, atraumatic, no cyanosis or edema Wound: Patient's incision and chest tube sites are healing well, there still some eschar around the posterior chest tube site this is one where the tube state and the longest   Diagnostic Studies & Laboratory data:     Recent Radiology Findings:   Dg Chest 2 View  Result Date: 05/14/2017 CLINICAL DATA:  Status post right  upper lobectomy on April 20, 2017, follow-up study. The patient reports right upper chest discomfort radiating to the right breast in the region of the incisions. EXAM: CHEST  2 VIEW COMPARISON:  Chest x-ray of May 03, 2017 FINDINGS: The lungs are mildly hypoinflated. There is mild volume loss on the right. There is no pneumothorax. There is subcutaneous emphysema in the inferior right axillary region and in the lateral right breast region. There is no significant mediastinal shift. There is no pleural effusion. The heart and pulmonary vascularity are normal. There surgical clips in the right suprahilar region. The bony thorax exhibits no acute abnormality. Sh IMPRESSION: Decreasing right-sided chest wall emphysema. No pneumothorax or pneumomediastinum. Stable right-sided volume loss. Electronically Signed   By: David  Martinique M.D.   On: 05/14/2017 14:01    I have independently reviewed the above radiology studies  and reviewed the findings with the patient.   Recent Lab Findings: Lab Results  Component Value Date   WBC 8.5 04/29/2017   HGB 7.4 (L) 04/29/2017   HCT 22.3 (L) 04/29/2017   PLT 293 04/29/2017   GLUCOSE 101 (H) 05/01/2017   CHOL 222 (H) 06/10/2011   TRIG 90 06/10/2011   HDL 70 06/10/2011   LDLCALC 134 (H) 06/10/2011   ALT 9 (L) 04/22/2017   AST 27 04/22/2017   NA 135 05/01/2017   K 4.4 05/01/2017   CL 101 05/01/2017   CREATININE 0.73 05/01/2017   BUN 9 05/01/2017   CO2 25 05/01/2017   INR 0.96 04/16/2017      Assessment / Plan:     Patient doing well following recent right upper lobectomy for stage I adenocarcinoma She is making appropriate postoperative recovery Chest x-ray is stable  Plan to see the patient back in 3-4 weeks with follow-up chest x-ray, I discussed with her plans for follow-up CT scan in 6 months postop  We discussed her case being presented to the multidisciplinary thoracic oncology conference, and no further chemotherapy or radiation was recommended,  we'll continue with close follow-up     Grace Isaac MD      [address] [place] [address] Office [phone]   Beeper [phone]  05/14/2017 2:35 PM

## 2017-05-21 DIAGNOSIS — M810 Age-related osteoporosis without current pathological fracture: Secondary | ICD-10-CM | POA: Diagnosis not present

## 2017-05-21 DIAGNOSIS — Z7982 Long term (current) use of aspirin: Secondary | ICD-10-CM | POA: Diagnosis not present

## 2017-05-21 DIAGNOSIS — S81801D Unspecified open wound, right lower leg, subsequent encounter: Secondary | ICD-10-CM | POA: Diagnosis not present

## 2017-05-21 DIAGNOSIS — M549 Dorsalgia, unspecified: Secondary | ICD-10-CM | POA: Diagnosis not present

## 2017-05-21 DIAGNOSIS — Z79891 Long term (current) use of opiate analgesic: Secondary | ICD-10-CM | POA: Diagnosis not present

## 2017-05-21 DIAGNOSIS — E785 Hyperlipidemia, unspecified: Secondary | ICD-10-CM | POA: Diagnosis not present

## 2017-05-21 DIAGNOSIS — I10 Essential (primary) hypertension: Secondary | ICD-10-CM | POA: Diagnosis not present

## 2017-05-21 DIAGNOSIS — Z48813 Encounter for surgical aftercare following surgery on the respiratory system: Secondary | ICD-10-CM | POA: Diagnosis not present

## 2017-05-21 DIAGNOSIS — S81802D Unspecified open wound, left lower leg, subsequent encounter: Secondary | ICD-10-CM | POA: Diagnosis not present

## 2017-05-21 DIAGNOSIS — C3411 Malignant neoplasm of upper lobe, right bronchus or lung: Secondary | ICD-10-CM | POA: Diagnosis not present

## 2017-05-22 DIAGNOSIS — M549 Dorsalgia, unspecified: Secondary | ICD-10-CM | POA: Diagnosis not present

## 2017-05-22 DIAGNOSIS — I10 Essential (primary) hypertension: Secondary | ICD-10-CM | POA: Diagnosis not present

## 2017-05-22 DIAGNOSIS — Z7982 Long term (current) use of aspirin: Secondary | ICD-10-CM | POA: Diagnosis not present

## 2017-05-22 DIAGNOSIS — Z79891 Long term (current) use of opiate analgesic: Secondary | ICD-10-CM | POA: Diagnosis not present

## 2017-05-22 DIAGNOSIS — Z48813 Encounter for surgical aftercare following surgery on the respiratory system: Secondary | ICD-10-CM | POA: Diagnosis not present

## 2017-05-22 DIAGNOSIS — M810 Age-related osteoporosis without current pathological fracture: Secondary | ICD-10-CM | POA: Diagnosis not present

## 2017-05-22 DIAGNOSIS — E785 Hyperlipidemia, unspecified: Secondary | ICD-10-CM | POA: Diagnosis not present

## 2017-05-22 DIAGNOSIS — S81801D Unspecified open wound, right lower leg, subsequent encounter: Secondary | ICD-10-CM | POA: Diagnosis not present

## 2017-05-22 DIAGNOSIS — S81802D Unspecified open wound, left lower leg, subsequent encounter: Secondary | ICD-10-CM | POA: Diagnosis not present

## 2017-05-22 DIAGNOSIS — C3411 Malignant neoplasm of upper lobe, right bronchus or lung: Secondary | ICD-10-CM | POA: Diagnosis not present

## 2017-05-28 DIAGNOSIS — R5383 Other fatigue: Secondary | ICD-10-CM | POA: Diagnosis not present

## 2017-05-28 DIAGNOSIS — I10 Essential (primary) hypertension: Secondary | ICD-10-CM | POA: Diagnosis not present

## 2017-05-28 DIAGNOSIS — Z299 Encounter for prophylactic measures, unspecified: Secondary | ICD-10-CM | POA: Diagnosis not present

## 2017-05-28 DIAGNOSIS — Z6825 Body mass index (BMI) 25.0-25.9, adult: Secondary | ICD-10-CM | POA: Diagnosis not present

## 2017-05-28 DIAGNOSIS — Z79899 Other long term (current) drug therapy: Secondary | ICD-10-CM | POA: Diagnosis not present

## 2017-05-28 DIAGNOSIS — C3491 Malignant neoplasm of unspecified part of right bronchus or lung: Secondary | ICD-10-CM | POA: Diagnosis not present

## 2017-06-18 ENCOUNTER — Encounter: Payer: Medicare HMO | Admitting: Cardiothoracic Surgery

## 2017-06-23 ENCOUNTER — Other Ambulatory Visit: Payer: Self-pay

## 2017-06-23 DIAGNOSIS — C3492 Malignant neoplasm of unspecified part of left bronchus or lung: Secondary | ICD-10-CM

## 2017-06-23 NOTE — Progress Notes (Unsigned)
cxr 

## 2017-06-25 ENCOUNTER — Ambulatory Visit (INDEPENDENT_AMBULATORY_CARE_PROVIDER_SITE_OTHER): Payer: Medicare HMO | Admitting: Cardiothoracic Surgery

## 2017-06-25 ENCOUNTER — Encounter: Payer: Self-pay | Admitting: Cardiothoracic Surgery

## 2017-06-25 ENCOUNTER — Ambulatory Visit
Admission: RE | Admit: 2017-06-25 | Discharge: 2017-06-25 | Disposition: A | Discharge status: Home / Self Care | Payer: Medicare HMO | Source: Ambulatory Visit | Attending: Cardiothoracic Surgery | Admitting: Cardiothoracic Surgery

## 2017-06-25 VITALS — BP 138/67 | HR 65 | Resp 16 | Ht 59.0 in | Wt 125.4 lb

## 2017-06-25 DIAGNOSIS — C3411 Malignant neoplasm of upper lobe, right bronchus or lung: Secondary | ICD-10-CM | POA: Diagnosis not present

## 2017-06-25 DIAGNOSIS — Z902 Acquired absence of lung [part of]: Secondary | ICD-10-CM

## 2017-06-25 DIAGNOSIS — R918 Other nonspecific abnormal finding of lung field: Secondary | ICD-10-CM | POA: Diagnosis not present

## 2017-06-25 DIAGNOSIS — C3492 Malignant neoplasm of unspecified part of left bronchus or lung: Secondary | ICD-10-CM

## 2017-06-25 NOTE — Progress Notes (Signed)
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 SURGICAL PROCEDURE:  Video bronchoscopy, right video-assisted thoracoscopy, right upper lobectomy with lymph node dissection, and placement of On-Q.  Cancer Staging Malignant neoplasm of right upper lobe of lung (HCC) Staging form: Lung, AJCC 8th Edition - Pathologic stage from 04/21/2017: Stage IA2 (pT1b, pN0, cM0) - Signed by Grace Isaac, MD on 04/22/2017  History of Present Illness:     Patient returns to office after  right upper lobectomy for stage Ia adenocarcinoma of the lung 04/20/2018.  The patient had a slow postoperative course, complicated by pleural fluid infection which required IV antibiotics. Ultimately this totally cleared and the patient's chest tubes were removed.   Patient continues to make good progress postoperatively and increasing her physical activity without significant shortness of breath, she's had no fever chills or other indications of infection.    Past Medical History:  Diagnosis Date  . Anxiety   . Dyspnea    with exertion  . 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 Family history of adverse reaction to anesthesia    Children - N/V  . History of blood transfusion   . Hyperlipidemia   . Other osteoporosis   . Other thalassemia ([place])      History  Smoking Status  . Never Smoker  Smokeless Tobacco  . Never Used    History  Alcohol Use No     No Known Allergies  Current Outpatient Prescriptions  Medication Sig Dispense Refill  . acetaminophen (TYLENOL) 325 MG tablet Take  2 tablets (650 mg total) by mouth every 6 (six) hours as needed for mild pain.    Marland Kitchen amLODipine (NORVASC) 10 MG tablet Take 10 mg by mouth daily.    Marland Kitchen aspirin EC 81 MG tablet Take 81 mg by mouth daily.    . benzonatate (TESSALON) 200 MG capsule Take 200 mg by mouth 3 (three) times daily as needed for cough.    . Calcium-Phosphorus-Vitamin D [id number] MG-MG-UNIT CHEW Chew 1 each by mouth 2 (two) times daily.     Marland Kitchen escitalopram (LEXAPRO) 10 MG tablet Take 10 mg by mouth daily.    . hydrochlorothiazide (HYDRODIURIL) 25 MG tablet Take 1 tablet (25 mg total) by mouth daily. 93 tablet 4  . Krill Oil 350 MG CAPS Take 1 capsule by mouth daily.    . Multiple Vitamin (MULTIVITAMIN WITH MINERALS) TABS tablet Take 1 tablet by mouth daily.    . traMADol (ULTRAM) 50 MG tablet Take 1-2 tablets (50-100 mg total) by mouth every 6 (six) hours as needed (mild pain). 30 tablet 0   No current facility-administered medications for this visit.        Physical Exam: BP 138/67 (BP Location: Right Arm, Patient Position: Sitting, Cuff Size: Large)   Pulse 65   Resp 16   Ht 4\' 11"  (1.499 m)   Wt 125 lb 6.4 oz (56.9 kg)   LMP  (LMP Unknown)   SpO2 98% Comment: ON RA  BMI 25.33  kg/m   General appearance: alert and cooperative Neurologic: intact Heart: regular rate and rhythm, S1, S2 normal, no murmur, click, rub or gallop Lungs: clear to auscultation bilaterally Abdomen: soft, non-tender; bowel sounds normal; no masses,  no organomegaly Extremities: extremities normal, atraumatic, no cyanosis or edema Wound: Patient's incision and chest tube sites are healing well, there still some eschar around the posterior chest tube site this is one where the tube state and the longest   Diagnostic Studies & Laboratory data:     Recent Radiology Findings:   Dg Chest 2 View  Result Date: 06/25/2017 CLINICAL DATA:  No chest pain, shortness of breath EXAM: CHEST  2 VIEW COMPARISON:  05/14/2017 FINDINGS: The heart size  and mediastinal contours are within normal limits. Both lungs are clear. The visualized skeletal structures are unremarkable. IMPRESSION: No active cardiopulmonary disease. Electronically Signed   By: Kathreen Devoid   On: 06/25/2017 14:46    I have independently reviewed the above radiology studies  and reviewed the findings with the patient.   Recent Lab Findings: Lab Results  Component Value Date   WBC 8.5 04/29/2017   HGB 7.4 (L) 04/29/2017   HCT 22.3 (L) 04/29/2017   PLT 293 04/29/2017   GLUCOSE 101 (H) 05/01/2017   CHOL 222 (H) 06/10/2011   TRIG 90 06/10/2011   HDL 70 06/10/2011   LDLCALC 134 (H) 06/10/2011   ALT 9 (L) 04/22/2017   AST 27 04/22/2017   NA 135 05/01/2017   K 4.4 05/01/2017   CL 101 05/01/2017   CREATININE 0.73 05/01/2017   BUN 9 05/01/2017   CO2 25 05/01/2017   INR 0.96 04/16/2017      Assessment / Plan:   Patient doing well after resection for stage IA a carcinoma of the lung. The patient's making good progress postoperatively. We'll plan on follow-up CT scan in approximately 3 months which will be 6 months postop.        We discussed her case being presented to the multidisciplinary thoracic oncology conference, and no further chemotherapy or radiation was recommended, we'll continue with close follow-up     Grace Isaac MD      [address] [place] [address] Office [phone]   Beeper [phone]  06/25/2017 3:15 PM

## 2017-07-02 DIAGNOSIS — Z1389 Encounter for screening for other disorder: Secondary | ICD-10-CM | POA: Diagnosis not present

## 2017-07-02 DIAGNOSIS — Z299 Encounter for prophylactic measures, unspecified: Secondary | ICD-10-CM | POA: Diagnosis not present

## 2017-07-02 DIAGNOSIS — I10 Essential (primary) hypertension: Secondary | ICD-10-CM | POA: Diagnosis not present

## 2017-07-02 DIAGNOSIS — Z7189 Other specified counseling: Secondary | ICD-10-CM | POA: Diagnosis not present

## 2017-07-02 DIAGNOSIS — C3491 Malignant neoplasm of unspecified part of right bronchus or lung: Secondary | ICD-10-CM | POA: Diagnosis not present

## 2017-07-02 DIAGNOSIS — R69 Illness, unspecified: Secondary | ICD-10-CM | POA: Diagnosis not present

## 2017-07-02 DIAGNOSIS — E663 Overweight: Secondary | ICD-10-CM | POA: Diagnosis not present

## 2017-07-02 DIAGNOSIS — Z0000 Encounter for general adult medical examination without abnormal findings: Secondary | ICD-10-CM | POA: Diagnosis not present

## 2017-07-02 DIAGNOSIS — R0989 Other specified symptoms and signs involving the circulatory and respiratory systems: Secondary | ICD-10-CM | POA: Diagnosis not present

## 2017-07-03 DIAGNOSIS — Z79899 Other long term (current) drug therapy: Secondary | ICD-10-CM | POA: Diagnosis not present

## 2017-07-03 DIAGNOSIS — E785 Hyperlipidemia, unspecified: Secondary | ICD-10-CM | POA: Diagnosis not present

## 2017-07-03 DIAGNOSIS — Z0000 Encounter for general adult medical examination without abnormal findings: Secondary | ICD-10-CM | POA: Diagnosis not present

## 2017-07-03 DIAGNOSIS — R69 Illness, unspecified: Secondary | ICD-10-CM | POA: Diagnosis not present

## 2017-07-06 DIAGNOSIS — R42 Dizziness and giddiness: Secondary | ICD-10-CM | POA: Diagnosis not present

## 2017-07-06 DIAGNOSIS — I6523 Occlusion and stenosis of bilateral carotid arteries: Secondary | ICD-10-CM | POA: Diagnosis not present

## 2017-07-06 DIAGNOSIS — R0989 Other specified symptoms and signs involving the circulatory and respiratory systems: Secondary | ICD-10-CM | POA: Diagnosis not present

## 2017-07-17 DIAGNOSIS — M159 Polyosteoarthritis, unspecified: Secondary | ICD-10-CM | POA: Diagnosis not present

## 2017-07-17 DIAGNOSIS — I10 Essential (primary) hypertension: Secondary | ICD-10-CM | POA: Diagnosis not present

## 2017-07-21 DIAGNOSIS — R69 Illness, unspecified: Secondary | ICD-10-CM | POA: Diagnosis not present

## 2017-07-24 ENCOUNTER — Ambulatory Visit: Payer: Medicare HMO | Admitting: Internal Medicine

## 2017-07-31 ENCOUNTER — Encounter: Payer: Self-pay | Admitting: Internal Medicine

## 2017-07-31 ENCOUNTER — Ambulatory Visit (INDEPENDENT_AMBULATORY_CARE_PROVIDER_SITE_OTHER): Payer: Medicare HMO | Admitting: Internal Medicine

## 2017-07-31 VITALS — BP 120/60 | HR 73 | Resp 16 | Ht 59.0 in | Wt 123.4 lb

## 2017-07-31 DIAGNOSIS — I2510 Atherosclerotic heart disease of native coronary artery without angina pectoris: Secondary | ICD-10-CM

## 2017-07-31 DIAGNOSIS — I2584 Coronary atherosclerosis due to calcified coronary lesion: Secondary | ICD-10-CM

## 2017-07-31 DIAGNOSIS — R0609 Other forms of dyspnea: Secondary | ICD-10-CM

## 2017-07-31 DIAGNOSIS — I10 Essential (primary) hypertension: Secondary | ICD-10-CM

## 2017-07-31 DIAGNOSIS — E785 Hyperlipidemia, unspecified: Secondary | ICD-10-CM | POA: Diagnosis not present

## 2017-07-31 DIAGNOSIS — R0789 Other chest pain: Secondary | ICD-10-CM | POA: Diagnosis not present

## 2017-07-31 DIAGNOSIS — R0600 Dyspnea, unspecified: Secondary | ICD-10-CM

## 2017-07-31 NOTE — Patient Instructions (Signed)
Medication Instructions:  Your physician recommends that you continue on your current medications as directed. Please refer to the Current Medication list given to you today.    Labwork: None  Testing/Procedures: Your physician has requested that you have an echocardiogram. Echocardiography is a painless test that uses sound waves to create images of your heart. It provides your doctor with information about the size and shape of your heart and how well your heart's chambers and valves are working. This procedure takes approximately one hour. There are no restrictions for this procedure.  Your physician has requested that you have a lexiscan myoview. For further information please visit [email or web address]. Please follow instruction sheet, as given.     Follow-Up: Your physician recommends that you schedule a follow-up appointment in: 2 months with Dr End.         If you need a refill on your cardiac medications before your next appointment, please call your pharmacy.

## 2017-07-31 NOTE — Progress Notes (Signed)
Follow-up Outpatient Visit Date: 07/31/2017  Primary Care Provider: [name], MD [place] [id number]  Chief Complaint: Shortness of breath  HPI:  Ms. Vidrine is a 76 y.o. year-old female with history of coronary artery calcification noted on CT, hypertension, hyperlipidemia, and lung adenocarcinoma, who presents for follow-up of dyspnea on exertion.  First met Ms. Brucks in July for preoperative cardiovascular risk assessment before undergoing excision of her right lung adenocarcinoma.  At that time, she reported chronic dyspnea on exertion.  We did not perfuse to any further workup at that time.  Ms. Shur underwent successful resection of her adenocarcinoma, though the surgery was complicated by a staph infection.  She still has a little bit of chest wall soreness along the right breast but otherwise it has recovered well.  Ms. Rookstool continues to have considerable exertional dyspnea with mild activity.  She also noted a single episode of chest pain yesterday while picking and throwing sticks in her yard.  The pain lasted for less than 5 minutes and then resolved on its own.  She has not had any exertional chest pain.  She denies palpitations but has occasional dizziness when she turns her head rapidly or bends over.  Ms. Southgate denies orthopnea, PND, and edema.  --------------------------------------------------------------------------------------------------  Cardiovascular History & Procedures: Cardiovascular Problems:  Fatigue and dyspnea on exertion  Risk Factors:  Coronary artery calcification on chest CT, hypertension, hyperlipidemia, family history, and age greater than 72  Cath/PCI:  None  CV Surgery:  None  EP Procedures and Devices:  None  Non-Invasive Evaluation(s):  None  Recent CV Pertinent Labs: Lab Results  Component Value Date   CHOL 222 (H) 06/10/2011   HDL 70 06/10/2011   LDLCALC 134 (H) 06/10/2011   TRIG 90 06/10/2011     CHOLHDL 3.2 06/10/2011   INR 0.96 04/16/2017   K 4.4 05/01/2017   MG 1.7 04/25/2017   BUN 9 05/01/2017   CREATININE 0.73 05/01/2017   CREATININE 0.71 10/31/2011    Past medical and surgical history were reviewed and updated in EPIC.  Current Meds  Medication Sig  . acetaminophen (TYLENOL) 325 MG tablet Take 2 tablets (650 mg total) by mouth every 6 (six) hours as needed for mild pain.  Marland Kitchen amLODipine (NORVASC) 10 MG tablet Take 10 mg by mouth daily.  Marland Kitchen aspirin EC 81 MG tablet Take 81 mg by mouth daily.  . benzonatate (TESSALON) 200 MG capsule Take 200 mg by mouth 3 (three) times daily as needed for cough.  . Calcium-Phosphorus-Vitamin D [id number] MG-MG-UNIT CHEW Chew 1 each by mouth 2 (two) times daily.   Marland Kitchen escitalopram (LEXAPRO) 10 MG tablet Take 10 mg by mouth daily.  . hydrochlorothiazide (HYDRODIURIL) 25 MG tablet Take 1 tablet (25 mg total) by mouth daily.  Javier Docker Oil 350 MG CAPS Take 1 capsule by mouth daily.  . Multiple Vitamin (MULTIVITAMIN WITH MINERALS) TABS tablet Take 1 tablet by mouth daily.  . Probiotic Product ([employer] ADV PROBIOTIC GUMMIES) CHEW Chew 1 each by mouth daily.  . [DISCONTINUED] traMADol (ULTRAM) 50 MG tablet Take 1-2 tablets (50-100 mg total) by mouth every 6 (six) hours as needed (mild pain).    Allergies: Lisinopril  Social History   Social History  . Marital status: Widowed    Spouse name: N/A  . Number of children: N/A  . Years of education: N/A   Occupational History  . retired    Social History Main Topics  . Smoking  status: Never Smoker  . Smokeless tobacco: Never Used  . Alcohol use No  . Drug use: No  . Sexual activity: No   Other Topics Concern  . Not on file   Social History Narrative   Mrs. Hohn is married. Her husband has been chronically ill for 6 years (2011). He has metastatic prostate cancer and multiple myeloma. 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 She has a lot of family support.     Family  History  Problem Relation Age of Onset  . Hypertension Mother   . Heart attack Sister 42    Review of Systems: A 12-system review of systems was performed and was negative except as noted in the HPI.  --------------------------------------------------------------------------------------------------  Physical Exam: BP 120/60   Pulse 73   Resp 16   Ht _0  (1.499 m)   Wt 123 lb 6.4 oz (56 kg)   LMP  (LMP Unknown)   SpO2 98%   BMI 24.92 kg/m   General: Well-developed, well-nourished woman, seated comfortably in the exam room. HEENT: No conjunctival pallor or scleral icterus. Moist mucous membranes.  OP clear. Neck: Supple without lymphadenopathy, thyromegaly, JVD, or HJR. Lungs: Normal work of breathing. Clear to auscultation bilaterally without wheezes or crackles. Heart: Regular rate and rhythm without murmurs, rubs, or gallops. Non-displaced PMI. Abd: Bowel sounds present. Soft, NT/ND without hepatosplenomegaly Ext: No lower extremity edema. Radial, PT, and DP pulses are 2+ bilaterally. Skin: Warm and dry without rash.   Lab Results  Component Value Date   WBC 8.5 04/29/2017   HGB 7.4 (L) 04/29/2017   HCT 22.3 (L) 04/29/2017   MCV 61.4 (L) 04/29/2017   PLT 293 04/29/2017    Lab Results  Component Value Date   NA 135 05/01/2017   K 4.4 05/01/2017   CL 101 05/01/2017   CO2 25 05/01/2017   BUN 9 05/01/2017   CREATININE 0.73 05/01/2017   GLUCOSE 101 (H) 05/01/2017   ALT 9 (L) 04/22/2017    Lab Results  Component Value Date   CHOL 222 (H) 06/10/2011   HDL 70 06/10/2011   LDLCALC 134 (H) 06/10/2011   TRIG 90 06/10/2011   CHOLHDL 3.2 06/10/2011    --------------------------------------------------------------------------------------------------  ASSESSMENT AND PLAN: Atypical chest pain, dyspnea on exertion, and coronary artery calcification Overall, symptoms are stable with the exception of a single episode of atypical chest pain that occurred yesterday.   Given coronary artery calcification on prior imaging and persistent dyspnea on exertion, we have agreed to perform a pharmacologic myocardial perfusion stress test and transthoracic echocardiogram.  I discussed adding a statin for primary prevention in the setting of coronary atherosclerosis, though Ms. Olena Heckle asked to defer this until after the aforementioned testing.  Hypertension Blood pressure is well controlled today.  No medication changes at this time.  Hyperlipidemia LDL noted to be 117 in March.  I recommended that we consider adding a statin to bring her LDL to at least less than 100, given coronary artery calcification.  Ms. Woodson would like to defer this until after the aforementioned stress test.  Follow-up: Return to clinic in 2 months.  Nelva Bush, MD 07/31/2017 11:36 AM

## 2017-08-01 DIAGNOSIS — I2510 Atherosclerotic heart disease of native coronary artery without angina pectoris: Secondary | ICD-10-CM | POA: Insufficient documentation

## 2017-08-01 DIAGNOSIS — R0602 Shortness of breath: Secondary | ICD-10-CM | POA: Insufficient documentation

## 2017-08-01 DIAGNOSIS — I2584 Coronary atherosclerosis due to calcified coronary lesion: Secondary | ICD-10-CM

## 2017-08-01 DIAGNOSIS — R0789 Other chest pain: Secondary | ICD-10-CM | POA: Insufficient documentation

## 2017-08-03 ENCOUNTER — Telehealth (HOSPITAL_COMMUNITY): Payer: Self-pay | Admitting: *Deleted

## 2017-08-03 NOTE — Telephone Encounter (Signed)
Patient given detailed instructions per Myocardial Perfusion Study Information Sheet for the test on 08/04/17. Patient notified to arrive 15 minutes early and that it is imperative to arrive on time for appointment to keep from having the test rescheduled.  If you need to cancel or reschedule your appointment, please call the office within 24 hours of your appointment. . Patient verbalized understanding. Kirstie Peri

## 2017-08-04 ENCOUNTER — Ambulatory Visit (HOSPITAL_COMMUNITY): Payer: Medicare HMO | Attending: Cardiology

## 2017-08-04 DIAGNOSIS — R0609 Other forms of dyspnea: Secondary | ICD-10-CM

## 2017-08-04 DIAGNOSIS — R0789 Other chest pain: Secondary | ICD-10-CM

## 2017-08-04 DIAGNOSIS — R0600 Dyspnea, unspecified: Secondary | ICD-10-CM

## 2017-08-04 LAB — MYOCARDIAL PERFUSION IMAGING
LV dias vol: 41 mL (ref 46–106)
LV sys vol: 8 mL
Peak HR: 82 {beats}/min
RATE: 0.35
Rest HR: 58 {beats}/min
SDS: 2
SRS: 10
SSS: 12
TID: 0.87

## 2017-08-04 MED ORDER — TECHNETIUM TC 99M TETROFOSMIN IV KIT
10.3000 | PACK | Freq: Once | INTRAVENOUS | Status: AC | PRN
  Administered 2017-08-04: 10.3 via INTRAVENOUS
  Filled 2017-08-04: qty 11

## 2017-08-04 MED ORDER — TECHNETIUM TC 99M TETROFOSMIN IV KIT
32.8000 | PACK | Freq: Once | INTRAVENOUS | Status: AC | PRN
  Administered 2017-08-04: 32.8 via INTRAVENOUS
  Filled 2017-08-04: qty 33

## 2017-08-04 MED ORDER — REGADENOSON 0.4 MG/5ML IV SOLN
0.4000 mg | Freq: Once | INTRAVENOUS | Status: AC
  Administered 2017-08-04: 0.4 mg via INTRAVENOUS

## 2017-08-07 ENCOUNTER — Ambulatory Visit (HOSPITAL_COMMUNITY): Payer: Medicare HMO | Attending: Cardiovascular Disease

## 2017-08-07 ENCOUNTER — Other Ambulatory Visit: Payer: Self-pay

## 2017-08-07 DIAGNOSIS — R0609 Other forms of dyspnea: Secondary | ICD-10-CM | POA: Insufficient documentation

## 2017-08-07 DIAGNOSIS — R0789 Other chest pain: Secondary | ICD-10-CM | POA: Insufficient documentation

## 2017-08-07 DIAGNOSIS — R0600 Dyspnea, unspecified: Secondary | ICD-10-CM

## 2017-08-31 DIAGNOSIS — I10 Essential (primary) hypertension: Secondary | ICD-10-CM | POA: Diagnosis not present

## 2017-08-31 DIAGNOSIS — M159 Polyosteoarthritis, unspecified: Secondary | ICD-10-CM | POA: Diagnosis not present

## 2017-09-11 ENCOUNTER — Other Ambulatory Visit: Payer: Self-pay | Admitting: Cardiothoracic Surgery

## 2017-09-11 DIAGNOSIS — C3411 Malignant neoplasm of upper lobe, right bronchus or lung: Secondary | ICD-10-CM

## 2017-09-11 DIAGNOSIS — C3490 Malignant neoplasm of unspecified part of unspecified bronchus or lung: Secondary | ICD-10-CM

## 2017-10-05 DIAGNOSIS — I10 Essential (primary) hypertension: Secondary | ICD-10-CM | POA: Diagnosis not present

## 2017-10-05 DIAGNOSIS — M159 Polyosteoarthritis, unspecified: Secondary | ICD-10-CM | POA: Diagnosis not present

## 2017-10-22 ENCOUNTER — Ambulatory Visit: Payer: Medicare HMO | Admitting: Internal Medicine

## 2017-10-22 ENCOUNTER — Ambulatory Visit: Payer: Medicare HMO | Admitting: Cardiothoracic Surgery

## 2017-10-22 ENCOUNTER — Ambulatory Visit
Admission: RE | Admit: 2017-10-22 | Discharge: 2017-10-22 | Disposition: A | Discharge status: Home / Self Care | Payer: Medicare HMO | Source: Ambulatory Visit | Attending: Cardiothoracic Surgery | Admitting: Cardiothoracic Surgery

## 2017-10-22 ENCOUNTER — Encounter: Payer: Self-pay | Admitting: Cardiothoracic Surgery

## 2017-10-22 ENCOUNTER — Other Ambulatory Visit: Payer: Self-pay

## 2017-10-22 VITALS — BP 144/76 | HR 66 | Resp 18 | Ht 59.0 in | Wt 124.2 lb

## 2017-10-22 DIAGNOSIS — C3490 Malignant neoplasm of unspecified part of unspecified bronchus or lung: Secondary | ICD-10-CM

## 2017-10-22 DIAGNOSIS — C3411 Malignant neoplasm of upper lobe, right bronchus or lung: Secondary | ICD-10-CM

## 2017-10-22 DIAGNOSIS — Z902 Acquired absence of lung [part of]: Secondary | ICD-10-CM

## 2017-10-22 NOTE — Progress Notes (Signed)
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 SURGICAL PROCEDURE:  Video bronchoscopy, right video-assisted thoracoscopy, right upper lobectomy with lymph node dissection, and placement of On-Q.  Cancer Staging Malignant neoplasm of right upper lobe of lung (HCC) Staging form: Lung, AJCC 8th Edition - Pathologic stage from 04/21/2017: Stage IA2 (pT1b, pN0, cM0) - Signed by Grace Isaac, MD on 04/22/2017  History of Present Illness:     Patient returns to office after  right upper lobectomy for stage Ia adenocarcinoma of the lung 04/20/2018.  The patient had a slow postoperative course, complicated by pleural fluid infection which required IV antibiotics. Ultimately this totally cleared and the patient's chest tubes were removed.   Patient continues to make good progress postoperatively and increasing her physical activity without significant shortness of breath, she's had no fever chills or other indications of infection.    Past Medical History:  Diagnosis Date  . Anxiety   . Dyspnea    with exertion  . 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 Family history of adverse reaction to anesthesia    Children - N/V  . History of blood transfusion   . Hyperlipidemia   . Other osteoporosis   . Other thalassemia ([place])      Social History   Tobacco Use  Smoking Status Never Smoker  Smokeless Tobacco Never Used    Social History   Substance and Sexual Activity  Alcohol Use No     Allergies  Allergen Reactions  . Lisinopril Cough    Current Outpatient  Medications  Medication Sig Dispense Refill  . acetaminophen (TYLENOL) 325 MG tablet Take 2 tablets (650 mg total) by mouth every 6 (six) hours as needed for mild pain.    Marland Kitchen amLODipine (NORVASC) 10 MG tablet Take 10 mg by mouth daily.    Marland Kitchen aspirin EC 81 MG tablet Take 81 mg by mouth daily.    . benzonatate (TESSALON) 200 MG capsule Take 200 mg by mouth 3 (three) times daily as needed for cough.    . Calcium-Phosphorus-Vitamin D [id number] MG-MG-UNIT CHEW Chew 1 each by mouth 2 (two) times daily.     Marland Kitchen escitalopram (LEXAPRO) 10 MG tablet Take 10 mg by mouth daily.    . hydrochlorothiazide (HYDRODIURIL) 25 MG tablet Take 1 tablet (25 mg total) by mouth daily. 93 tablet 4  . Multiple Vitamin (MULTIVITAMIN WITH MINERALS) TABS tablet Take 1 tablet by mouth daily.     No current facility-administered medications for this visit.        Physical Exam: BP (!) 144/76 (BP Location: Right Arm, Patient Position: Sitting, Cuff Size: Normal)   Pulse 66   Resp 18   Ht 4\' 11"  (1.499 m)   Wt 124 lb 3.2 oz (56.3 kg)   LMP  (LMP Unknown)   SpO2 98% Comment: RA  BMI 25.09 kg/m   General appearance: alert and cooperative Neurologic: intact Heart: regular rate and rhythm, S1, S2 normal, no murmur, click, rub or gallop Lungs: clear  to auscultation bilaterally Abdomen: soft, non-tender; bowel sounds normal; no masses,  no organomegaly Extremities: extremities normal, atraumatic, no cyanosis or edema Wound: Patient's incision and chest tube sites are well-healed   Diagnostic Studies & Laboratory data:     Recent Radiology Findings:   Ct Chest Wo Contrast  Result Date: 10/22/2017 CLINICAL DATA:  Non-small cell lung cancer. Right upper lobe lesion removed April 20, 2017. EXAM: CT CHEST WITHOUT CONTRAST TECHNIQUE: Multidetector CT imaging of the chest was performed following the standard protocol without IV contrast. COMPARISON:  Chest CT 02/18/2017 and PET-CT 03/18/2017 FINDINGS: Cardiovascular: The  heart is normal in size. No pericardial effusion. There is tortuosity, ectasia and calcification of the thoracic aorta. Coronary artery calcifications are noted. Mediastinum/Nodes: No mediastinal or hilar mass or lymphadenopathy. The esophagus is grossly normal. Lungs/Pleura: Surgical changes from a right upper lobe lung resection. No findings for residual or recurrent tumor. No acute pulmonary process. No worrisome pulmonary lesions. No pleural effusion. Upper Abdomen: No significant upper abdominal findings. Musculoskeletal: No breast masses, supraclavicular or axillary lymphadenopathy. No significant bony findings. No lytic or sclerotic bone lesions to suggest metastatic disease. IMPRESSION: 1. Status post resection of a right upper lobe pulmonary lesion without findings for residual or recurrent tumor. 2. No mediastinal or hilar mass or lymphadenopathy. 3. No worrisome pulmonary lesions or acute pulmonary process. Aortic Atherosclerosis (ICD10-I70.0). Electronically Signed   By: Marijo Sanes M.D.   On: 10/22/2017 12:32    I have independently reviewed the above radiology studies  and reviewed the findings with the patient.   Recent Lab Findings: Lab Results  Component Value Date   WBC 8.5 04/29/2017   HGB 7.4 (L) 04/29/2017   HCT 22.3 (L) 04/29/2017   PLT 293 04/29/2017   GLUCOSE 101 (H) 05/01/2017   CHOL 222 (H) 06/10/2011   TRIG 90 06/10/2011   HDL 70 06/10/2011   LDLCALC 134 (H) 06/10/2011   ALT 9 (L) 04/22/2017   AST 27 04/22/2017   NA 135 05/01/2017   K 4.4 05/01/2017   CL 101 05/01/2017   CREATININE 0.73 05/01/2017   BUN 9 05/01/2017   CO2 25 05/01/2017   INR 0.96 04/16/2017      Assessment / Plan:   Patient doing well after resection for stage IA a carcinoma of the lung.  Status post resection of a right upper lobe pulmonary lesion without findings for residual or recurrent tumor No mediastinal or hilar mass or lymphadenopathy.   We will plan follow-up CT of the chest  in 6 months which will be approximately 1 year postop   Grace Isaac MD      [address] ,[place] [address] Office [phone]   Beeper [phone]  10/22/2017 2:24 PM

## 2017-10-27 DIAGNOSIS — I10 Essential (primary) hypertension: Secondary | ICD-10-CM | POA: Diagnosis not present

## 2017-10-27 DIAGNOSIS — Z803 Family history of malignant neoplasm of breast: Secondary | ICD-10-CM | POA: Diagnosis not present

## 2017-10-27 DIAGNOSIS — R69 Illness, unspecified: Secondary | ICD-10-CM | POA: Diagnosis not present

## 2017-10-27 DIAGNOSIS — Z833 Family history of diabetes mellitus: Secondary | ICD-10-CM | POA: Diagnosis not present

## 2017-10-27 DIAGNOSIS — Z8249 Family history of ischemic heart disease and other diseases of the circulatory system: Secondary | ICD-10-CM | POA: Diagnosis not present

## 2017-10-27 DIAGNOSIS — Z825 Family history of asthma and other chronic lower respiratory diseases: Secondary | ICD-10-CM | POA: Diagnosis not present

## 2017-11-13 ENCOUNTER — Encounter: Payer: Self-pay | Admitting: Internal Medicine

## 2017-11-13 ENCOUNTER — Ambulatory Visit: Payer: Medicare HMO | Admitting: Internal Medicine

## 2017-11-13 VITALS — BP 132/62 | HR 75 | Resp 16 | Ht 59.0 in | Wt 123.6 lb

## 2017-11-13 DIAGNOSIS — R0602 Shortness of breath: Secondary | ICD-10-CM | POA: Diagnosis not present

## 2017-11-13 DIAGNOSIS — I2584 Coronary atherosclerosis due to calcified coronary lesion: Secondary | ICD-10-CM | POA: Diagnosis not present

## 2017-11-13 DIAGNOSIS — I2510 Atherosclerotic heart disease of native coronary artery without angina pectoris: Secondary | ICD-10-CM

## 2017-11-13 DIAGNOSIS — E785 Hyperlipidemia, unspecified: Secondary | ICD-10-CM | POA: Diagnosis not present

## 2017-11-13 NOTE — Patient Instructions (Signed)
Medication Instructions:  Your physician recommends that you continue on your current medications as directed. Please refer to the Current Medication list given to you today.  -- If you need a refill on your cardiac medications before your next appointment, please call your pharmacy. --  Labwork:  Lipid Direct LDL ALT  Testing/Procedures: None ordered  Follow-Up: Your physician wants you to follow-up in: 1 year with Dr. Saunders Revel.    You will receive a reminder letter in the mail two months in advance. If you don't receive a letter, please call our office to schedule the follow-up appointment.  Thank you for choosing [facility]!!    Any Other Special Instructions Will Be Listed Below (If Applicable).

## 2017-11-13 NOTE — Progress Notes (Signed)
Follow-up Outpatient Visit Date: 11/13/2017  Primary Care Provider: [name] [id number]  Chief Complaint: Shortness of breath  HPI:  Dawn Lin is a 77 y.o. year-old female with history of coronary artery calcification insulin noted on CT, hypertension, hyperlipidemia, and lung adenocarcinoma status post resection, who presents for follow-up of shortness of breath.  I last saw her in October, 2018, at which time we agreed to obtain a myocardial perfusion stress test.  This was a low risk..  Since then, her shortness of breath has continued to improve.  She still notes some dyspnea at times.  She has not had any chest pain other than residual tenderness and sensitivity around her thoracotomy.  She has noted some intermittent balance problems, though these are improving with balance exercises.  She denies palpitations, lightheadedness, orthopnea, PND, and edema.  Recent follow-up with Dr. Servando Snare was reassuring, with repeat chest CT showing no new pulmonary nodules.  --------------------------------------------------------------------------------------------------  Cardiovascular History & Procedures: Cardiovascular Problems:  Fatigue and dyspnea on exertion  Risk Factors:  Coronary artery calcification on chest CT, hypertension, hyperlipidemia, family history, and age greater than 49  Cath/PCI:  None  CV Surgery:  None  EP Procedures and Devices:  None  Non-Invasive Evaluation(s):  TTE (08/07/17): Normal LV size and wall thickness with EF of 60-65% and normal wall motion.  Grade 1 diastolic dysfunction.  Trivial aortic regurgitation.  Normal RV size and function.  Pharmacologic MPI (08/04/17): Large in size, fixed septal defect with normal LVEF likely representing artifact.  LVEF 80%.  Overall, low risk study.  Recent CV Pertinent Labs: Lab Results  Component Value Date   CHOL 222 (H) 06/10/2011   HDL 70 06/10/2011   LDLCALC 134 (H)  06/10/2011   TRIG 90 06/10/2011   CHOLHDL 3.2 06/10/2011   INR 0.96 04/16/2017   K 4.4 05/01/2017   MG 1.7 04/25/2017   BUN 9 05/01/2017   CREATININE 0.73 05/01/2017   CREATININE 0.71 10/31/2011    Past medical and surgical history were reviewed and updated in EPIC.  Current Meds  Medication Sig  . acetaminophen (TYLENOL) 325 MG tablet Take 2 tablets (650 mg total) by mouth every 6 (six) hours as needed for mild pain.  Marland Kitchen amLODipine (NORVASC) 10 MG tablet Take 10 mg by mouth daily.  Marland Kitchen aspirin EC 81 MG tablet Take 81 mg by mouth daily.  . benzonatate (TESSALON) 200 MG capsule Take 200 mg by mouth 3 (three) times daily as needed for cough.  . Calcium-Phosphorus-Vitamin D [id number] MG-MG-UNIT CHEW Chew 1 each by mouth 2 (two) times daily.   Marland Kitchen escitalopram (LEXAPRO) 10 MG tablet Take 10 mg by mouth daily.  . hydrochlorothiazide (HYDRODIURIL) 25 MG tablet Take 1 tablet (25 mg total) by mouth daily.  . Multiple Vitamin (MULTIVITAMIN WITH MINERALS) TABS tablet Take 1 tablet by mouth daily.    Allergies: Lisinopril  Social History   Socioeconomic History  . Marital status: Widowed    Spouse name: Not on file  . Number of children: Not on file  . Years of education: Not on file  . Highest education level: Not on file  Social Needs  . Financial resource strain: Not on file  . Food insecurity - worry: Not on file  . Food insecurity - inability: Not on file  . Transportation needs - medical: Not on file  . Transportation needs - non-medical: Not on file  Occupational History  . Occupation: retired  Tobacco  Use  . Smoking status: Never Smoker  . Smokeless tobacco: Never Used  Substance and Sexual Activity  . Alcohol use: No  . Drug use: No  . Sexual activity: No  Other Topics Concern  . Not on file  Social History Narrative   Dawn Lin is married. Her husband has been chronically ill for 6 years (2011). He has metastatic prostate cancer and multiple myeloma. 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 She has a lot of family support.     Family History  Problem Relation Age of Onset  . Hypertension Mother   . Heart attack Sister 7    Review of Systems: A 12-system review of systems was performed and was negative except as noted in the HPI.  --------------------------------------------------------------------------------------------------  Physical Exam: BP 132/62   Pulse 75   Resp 16   Ht _0  (1.499 m)   Wt 123 lb 9.6 oz (56.1 kg)   LMP  (LMP Unknown)   SpO2 98%   BMI 24.96 kg/m   General: Well-developed, well-nourished woman, seated comfortably in the exam room. HEENT: No conjunctival pallor or scleral icterus. Moist mucous membranes.  OP clear. Neck: Supple without lymphadenopathy, thyromegaly, JVD, or HJR. Lungs: Normal work of breathing. Clear to auscultation bilaterally without wheezes or crackles. Heart: Regular rate and rhythm without murmurs, rubs, or gallops. Non-displaced PMI. Abd: Bowel sounds present. Soft, NT/ND without hepatosplenomegaly Ext: No lower extremity edema. Skin: Warm and dry without rash.  Lab Results  Component Value Date   WBC 8.5 04/29/2017   HGB 7.4 (L) 04/29/2017   HCT 22.3 (L) 04/29/2017   MCV 61.4 (L) 04/29/2017   PLT 293 04/29/2017    Lab Results  Component Value Date   NA 135 05/01/2017   K 4.4 05/01/2017   CL 101 05/01/2017   CO2 25 05/01/2017   BUN 9 05/01/2017   CREATININE 0.73 05/01/2017   GLUCOSE 101 (H) 05/01/2017   ALT 9 (L) 04/22/2017    Lab Results  Component Value Date   CHOL 222 (H) 06/10/2011   HDL 70 06/10/2011   LDLCALC 134 (H) 06/10/2011   TRIG 90 06/10/2011   CHOLHDL 3.2 06/10/2011    --------------------------------------------------------------------------------------------------  ASSESSMENT AND PLAN: Shortness of breath, coronary artery calcification, and hyperlipidemia Dyspnea is gradually improving.  Myocardial perfusion stress test after our  last visit was low risk with fixed septal defect most likely representing artifact.  We have discussed implications of coronary artery calcification and role for medical therapy to prevent progression of disease.  [name] is still undecided about statin therapy.  We have agreed to check a lipid panel with direct LDL today to reassess her cholesterol.  If it is elevated, we will initiate statin therapy for primary prevention.  No further workup otherwise.  Hypertension Blood pressure upper normal.  No medication changes today.  Follow-up: Return to clinic in 1 year.  Nelva Bush, MD 11/13/2017 2:52 PM

## 2017-11-14 ENCOUNTER — Encounter: Payer: Self-pay | Admitting: Internal Medicine

## 2017-11-14 LAB — ALT: ALT: 12 IU/L (ref 0–32)

## 2017-11-14 LAB — LDL CHOLESTEROL, DIRECT: LDL Direct: 137 mg/dL — ABNORMAL HIGH (ref 0–99)

## 2017-11-18 ENCOUNTER — Telehealth: Payer: Self-pay

## 2017-11-18 DIAGNOSIS — E785 Hyperlipidemia, unspecified: Secondary | ICD-10-CM

## 2017-11-18 MED ORDER — ROSUVASTATIN CALCIUM 5 MG PO TABS: 5.0000 mg | ORAL_TABLET | Freq: Every day | ORAL | 3 refills | Status: DC

## 2017-11-18 NOTE — Telephone Encounter (Signed)
Spoke with patient in regards to her LDL cholesterol and that we want to start her on Rosuvastatin (Crestor) 5 mg daily and return for labs on May 9.. Patient verbalized understanding.

## 2017-11-19 DIAGNOSIS — Z6825 Body mass index (BMI) 25.0-25.9, adult: Secondary | ICD-10-CM | POA: Diagnosis not present

## 2017-11-19 DIAGNOSIS — R69 Illness, unspecified: Secondary | ICD-10-CM | POA: Diagnosis not present

## 2017-11-19 DIAGNOSIS — Z299 Encounter for prophylactic measures, unspecified: Secondary | ICD-10-CM | POA: Diagnosis not present

## 2017-11-19 DIAGNOSIS — M5030 Other cervical disc degeneration, unspecified cervical region: Secondary | ICD-10-CM | POA: Diagnosis not present

## 2017-11-19 DIAGNOSIS — I10 Essential (primary) hypertension: Secondary | ICD-10-CM | POA: Diagnosis not present

## 2017-11-19 DIAGNOSIS — E785 Hyperlipidemia, unspecified: Secondary | ICD-10-CM | POA: Diagnosis not present

## 2017-11-24 DIAGNOSIS — M5030 Other cervical disc degeneration, unspecified cervical region: Secondary | ICD-10-CM | POA: Diagnosis not present

## 2017-11-24 DIAGNOSIS — M542 Cervicalgia: Secondary | ICD-10-CM | POA: Diagnosis not present

## 2017-11-26 DIAGNOSIS — M542 Cervicalgia: Secondary | ICD-10-CM | POA: Diagnosis not present

## 2017-11-26 DIAGNOSIS — M5030 Other cervical disc degeneration, unspecified cervical region: Secondary | ICD-10-CM | POA: Diagnosis not present

## 2017-12-01 DIAGNOSIS — M5030 Other cervical disc degeneration, unspecified cervical region: Secondary | ICD-10-CM | POA: Diagnosis not present

## 2017-12-01 DIAGNOSIS — M542 Cervicalgia: Secondary | ICD-10-CM | POA: Diagnosis not present

## 2017-12-04 DIAGNOSIS — M542 Cervicalgia: Secondary | ICD-10-CM | POA: Diagnosis not present

## 2017-12-04 DIAGNOSIS — M5030 Other cervical disc degeneration, unspecified cervical region: Secondary | ICD-10-CM | POA: Diagnosis not present

## 2017-12-08 DIAGNOSIS — M542 Cervicalgia: Secondary | ICD-10-CM | POA: Diagnosis not present

## 2017-12-08 DIAGNOSIS — M5030 Other cervical disc degeneration, unspecified cervical region: Secondary | ICD-10-CM | POA: Diagnosis not present

## 2017-12-11 DIAGNOSIS — M542 Cervicalgia: Secondary | ICD-10-CM | POA: Diagnosis not present

## 2017-12-11 DIAGNOSIS — M5030 Other cervical disc degeneration, unspecified cervical region: Secondary | ICD-10-CM | POA: Diagnosis not present

## 2017-12-15 DIAGNOSIS — M5030 Other cervical disc degeneration, unspecified cervical region: Secondary | ICD-10-CM | POA: Diagnosis not present

## 2017-12-15 DIAGNOSIS — M542 Cervicalgia: Secondary | ICD-10-CM | POA: Diagnosis not present

## 2017-12-18 DIAGNOSIS — M5030 Other cervical disc degeneration, unspecified cervical region: Secondary | ICD-10-CM | POA: Diagnosis not present

## 2017-12-18 DIAGNOSIS — M542 Cervicalgia: Secondary | ICD-10-CM | POA: Diagnosis not present

## 2018-01-07 DIAGNOSIS — Z1211 Encounter for screening for malignant neoplasm of colon: Secondary | ICD-10-CM | POA: Diagnosis not present

## 2018-01-07 DIAGNOSIS — R634 Abnormal weight loss: Secondary | ICD-10-CM | POA: Diagnosis not present

## 2018-01-19 DIAGNOSIS — Z6825 Body mass index (BMI) 25.0-25.9, adult: Secondary | ICD-10-CM | POA: Diagnosis not present

## 2018-01-19 DIAGNOSIS — E7800 Pure hypercholesterolemia, unspecified: Secondary | ICD-10-CM | POA: Diagnosis not present

## 2018-01-19 DIAGNOSIS — Z713 Dietary counseling and surveillance: Secondary | ICD-10-CM | POA: Diagnosis not present

## 2018-01-19 DIAGNOSIS — Z299 Encounter for prophylactic measures, unspecified: Secondary | ICD-10-CM | POA: Diagnosis not present

## 2018-01-19 DIAGNOSIS — Z789 Other specified health status: Secondary | ICD-10-CM | POA: Diagnosis not present

## 2018-01-19 DIAGNOSIS — J329 Chronic sinusitis, unspecified: Secondary | ICD-10-CM | POA: Diagnosis not present

## 2018-01-20 DIAGNOSIS — M159 Polyosteoarthritis, unspecified: Secondary | ICD-10-CM | POA: Diagnosis not present

## 2018-01-20 DIAGNOSIS — I10 Essential (primary) hypertension: Secondary | ICD-10-CM | POA: Diagnosis not present

## 2018-02-03 DIAGNOSIS — Z1211 Encounter for screening for malignant neoplasm of colon: Secondary | ICD-10-CM | POA: Diagnosis not present

## 2018-02-03 DIAGNOSIS — Z1212 Encounter for screening for malignant neoplasm of rectum: Secondary | ICD-10-CM | POA: Diagnosis not present

## 2018-02-18 ENCOUNTER — Other Ambulatory Visit: Payer: Medicare HMO

## 2018-02-18 DIAGNOSIS — E785 Hyperlipidemia, unspecified: Secondary | ICD-10-CM | POA: Diagnosis not present

## 2018-02-18 LAB — LIPID PANEL
Chol/HDL Ratio: 1.9 ratio (ref 0.0–4.4)
Cholesterol, Total: 168 mg/dL (ref 100–199)
HDL: 90 mg/dL (ref >39)
LDL Calculated: 68 mg/dL (ref 0–99)
Triglycerides: 50 mg/dL (ref 0–149)
VLDL Cholesterol Cal: 10 mg/dL (ref 5–40)

## 2018-02-18 LAB — ALT: ALT: 21 IU/L (ref 0–32)

## 2018-02-19 DIAGNOSIS — Z6824 Body mass index (BMI) 24.0-24.9, adult: Secondary | ICD-10-CM | POA: Diagnosis not present

## 2018-02-19 DIAGNOSIS — I10 Essential (primary) hypertension: Secondary | ICD-10-CM | POA: Diagnosis not present

## 2018-02-19 DIAGNOSIS — C3491 Malignant neoplasm of unspecified part of right bronchus or lung: Secondary | ICD-10-CM | POA: Diagnosis not present

## 2018-02-19 DIAGNOSIS — R05 Cough: Secondary | ICD-10-CM | POA: Diagnosis not present

## 2018-02-19 DIAGNOSIS — M5030 Other cervical disc degeneration, unspecified cervical region: Secondary | ICD-10-CM | POA: Diagnosis not present

## 2018-02-19 DIAGNOSIS — Z299 Encounter for prophylactic measures, unspecified: Secondary | ICD-10-CM | POA: Diagnosis not present

## 2018-02-23 DIAGNOSIS — Z961 Presence of intraocular lens: Secondary | ICD-10-CM | POA: Diagnosis not present

## 2018-02-23 DIAGNOSIS — H43393 Other vitreous opacities, bilateral: Secondary | ICD-10-CM | POA: Diagnosis not present

## 2018-02-26 DIAGNOSIS — M9905 Segmental and somatic dysfunction of pelvic region: Secondary | ICD-10-CM | POA: Diagnosis not present

## 2018-02-26 DIAGNOSIS — M545 Low back pain: Secondary | ICD-10-CM | POA: Diagnosis not present

## 2018-02-26 DIAGNOSIS — M9902 Segmental and somatic dysfunction of thoracic region: Secondary | ICD-10-CM | POA: Diagnosis not present

## 2018-02-26 DIAGNOSIS — M546 Pain in thoracic spine: Secondary | ICD-10-CM | POA: Diagnosis not present

## 2018-02-26 DIAGNOSIS — M9903 Segmental and somatic dysfunction of lumbar region: Secondary | ICD-10-CM | POA: Diagnosis not present

## 2018-03-03 DIAGNOSIS — M545 Low back pain: Secondary | ICD-10-CM | POA: Diagnosis not present

## 2018-03-03 DIAGNOSIS — M9903 Segmental and somatic dysfunction of lumbar region: Secondary | ICD-10-CM | POA: Diagnosis not present

## 2018-03-03 DIAGNOSIS — M9902 Segmental and somatic dysfunction of thoracic region: Secondary | ICD-10-CM | POA: Diagnosis not present

## 2018-03-03 DIAGNOSIS — M9905 Segmental and somatic dysfunction of pelvic region: Secondary | ICD-10-CM | POA: Diagnosis not present

## 2018-03-03 DIAGNOSIS — M546 Pain in thoracic spine: Secondary | ICD-10-CM | POA: Diagnosis not present

## 2018-03-11 DIAGNOSIS — I10 Essential (primary) hypertension: Secondary | ICD-10-CM | POA: Diagnosis not present

## 2018-03-11 DIAGNOSIS — M159 Polyosteoarthritis, unspecified: Secondary | ICD-10-CM | POA: Diagnosis not present

## 2018-03-22 DIAGNOSIS — M9903 Segmental and somatic dysfunction of lumbar region: Secondary | ICD-10-CM | POA: Diagnosis not present

## 2018-03-22 DIAGNOSIS — M546 Pain in thoracic spine: Secondary | ICD-10-CM | POA: Diagnosis not present

## 2018-03-22 DIAGNOSIS — M9902 Segmental and somatic dysfunction of thoracic region: Secondary | ICD-10-CM | POA: Diagnosis not present

## 2018-03-22 DIAGNOSIS — M9905 Segmental and somatic dysfunction of pelvic region: Secondary | ICD-10-CM | POA: Diagnosis not present

## 2018-03-22 DIAGNOSIS — M545 Low back pain: Secondary | ICD-10-CM | POA: Diagnosis not present

## 2018-03-23 DIAGNOSIS — R69 Illness, unspecified: Secondary | ICD-10-CM | POA: Diagnosis not present

## 2018-04-01 ENCOUNTER — Other Ambulatory Visit: Payer: Self-pay | Admitting: Cardiothoracic Surgery

## 2018-04-01 DIAGNOSIS — C3490 Malignant neoplasm of unspecified part of unspecified bronchus or lung: Secondary | ICD-10-CM

## 2018-04-06 DIAGNOSIS — E663 Overweight: Secondary | ICD-10-CM | POA: Diagnosis not present

## 2018-04-06 DIAGNOSIS — I10 Essential (primary) hypertension: Secondary | ICD-10-CM | POA: Diagnosis not present

## 2018-04-06 DIAGNOSIS — Z789 Other specified health status: Secondary | ICD-10-CM | POA: Diagnosis not present

## 2018-04-06 DIAGNOSIS — R609 Edema, unspecified: Secondary | ICD-10-CM | POA: Diagnosis not present

## 2018-04-06 DIAGNOSIS — M1990 Unspecified osteoarthritis, unspecified site: Secondary | ICD-10-CM | POA: Diagnosis not present

## 2018-04-06 DIAGNOSIS — Z299 Encounter for prophylactic measures, unspecified: Secondary | ICD-10-CM | POA: Diagnosis not present

## 2018-04-13 DIAGNOSIS — Z299 Encounter for prophylactic measures, unspecified: Secondary | ICD-10-CM | POA: Diagnosis not present

## 2018-04-13 DIAGNOSIS — I10 Essential (primary) hypertension: Secondary | ICD-10-CM | POA: Diagnosis not present

## 2018-04-13 DIAGNOSIS — R609 Edema, unspecified: Secondary | ICD-10-CM | POA: Diagnosis not present

## 2018-04-13 DIAGNOSIS — R69 Illness, unspecified: Secondary | ICD-10-CM | POA: Diagnosis not present

## 2018-04-13 DIAGNOSIS — Z6825 Body mass index (BMI) 25.0-25.9, adult: Secondary | ICD-10-CM | POA: Diagnosis not present

## 2018-04-14 DIAGNOSIS — I10 Essential (primary) hypertension: Secondary | ICD-10-CM | POA: Diagnosis not present

## 2018-04-14 DIAGNOSIS — M159 Polyosteoarthritis, unspecified: Secondary | ICD-10-CM | POA: Diagnosis not present

## 2018-04-26 ENCOUNTER — Ambulatory Visit
Admission: RE | Admit: 2018-04-26 | Discharge: 2018-04-26 | Disposition: A | Discharge status: Home / Self Care | Payer: Medicare HMO | Source: Ambulatory Visit | Attending: Cardiothoracic Surgery | Admitting: Cardiothoracic Surgery

## 2018-04-26 DIAGNOSIS — C3411 Malignant neoplasm of upper lobe, right bronchus or lung: Secondary | ICD-10-CM | POA: Diagnosis not present

## 2018-04-26 DIAGNOSIS — C3490 Malignant neoplasm of unspecified part of unspecified bronchus or lung: Secondary | ICD-10-CM

## 2018-05-03 ENCOUNTER — Other Ambulatory Visit: Payer: Medicare HMO

## 2018-05-03 ENCOUNTER — Ambulatory Visit: Payer: Medicare HMO | Admitting: Cardiothoracic Surgery

## 2018-05-07 ENCOUNTER — Ambulatory Visit: Payer: Medicare HMO | Admitting: Cardiothoracic Surgery

## 2018-05-10 ENCOUNTER — Encounter: Payer: Self-pay | Admitting: Cardiothoracic Surgery

## 2018-05-10 ENCOUNTER — Ambulatory Visit: Payer: Medicare HMO | Admitting: Cardiothoracic Surgery

## 2018-05-10 VITALS — BP 120/58 | HR 64 | Resp 20 | Ht 59.0 in | Wt 126.0 lb

## 2018-05-10 DIAGNOSIS — Z902 Acquired absence of lung [part of]: Secondary | ICD-10-CM | POA: Diagnosis not present

## 2018-05-10 DIAGNOSIS — C3411 Malignant neoplasm of upper lobe, right bronchus or lung: Secondary | ICD-10-CM | POA: Diagnosis not present

## 2018-05-10 DIAGNOSIS — R918 Other nonspecific abnormal finding of lung field: Secondary | ICD-10-CM

## 2018-05-10 NOTE — Progress Notes (Signed)
[address]       [place] [address]             [phone]      Dawn Lin [place] Medical Record #[id number] Date of Birth: 09-04-1941  Referring: [name], MD Primary Care: [name], MD  Chief Complaint:   POST OP FOLLOW UP 04/20/2017  OPERATIVE REPORT POSTOPERATIVE DIAGNOSIS:  Right upper lobe adenocarcinoma of the lung. 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 SURGICAL PROCEDURE:  Video bronchoscopy, right video-assisted thoracoscopy, right upper lobectomy with lymph node dissection, and placement of On-Q.  Cancer Staging Malignant neoplasm of right upper lobe of lung (HCC) Staging form: Lung, AJCC 8th Edition - Pathologic stage from 04/21/2017: Stage IA2 (pT1b, pN0, cM0) - Signed by Grace Isaac, MD on 04/22/2017  History of Present Illness:     Patient returns to office after  right upper lobectomy for stage Ia adenocarcinoma of the lung 04/20/2018.  She returns today for follow-up CT scan now approximately 1 year postop.  She has had a minor cough no hemoptysis, no respiratory symptoms otherwise.       Past Medical History:  Diagnosis Date  . Anxiety   . Dyspnea    with exertion  . 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 Family history of adverse reaction to anesthesia    Children - N/V  . History of blood transfusion   . Hyperlipidemia   . Other osteoporosis   . Other thalassemia ([place])      Social History   Tobacco Use  Smoking Status Never Smoker  Smokeless Tobacco Never Used    Social History   Substance and Sexual Activity  Alcohol Use No     Allergies  Allergen Reactions  . Lisinopril Cough    Current Outpatient Medications  Medication Sig Dispense Refill  . acetaminophen (TYLENOL) 325 MG tablet Take 2 tablets (650 mg total) by mouth every 6 (six) hours as needed for mild pain.    Marland Kitchen amLODipine (NORVASC) 10 MG tablet Take 10 mg by mouth daily.     Marland Kitchen aspirin EC 81 MG tablet Take 81 mg by mouth daily.    . Calcium-Phosphorus-Vitamin D [id number] MG-MG-UNIT CHEW Chew 1 each by mouth 2 (two) times daily.     . hydrochlorothiazide (HYDRODIURIL) 25 MG tablet Take 1 tablet (25 mg total) by mouth daily. 93 tablet 4  . Multiple Vitamin (MULTIVITAMIN WITH MINERALS) TABS tablet Take 1 tablet by mouth daily.    . benzonatate (TESSALON) 200 MG capsule Take 200 mg by mouth 3 (three) times daily as needed for cough.    . escitalopram (LEXAPRO) 10 MG tablet Take 10 mg by mouth daily.    . rosuvastatin (CRESTOR) 5 MG tablet Take 1 tablet (5 mg total) by mouth daily. 90 tablet 3   No current facility-administered medications for this visit.        Physical Exam: BP (!) 120/58   Pulse 64   Resp 20   Ht 4\' 11"  (1.499 m)   Wt 57.2 kg (126 lb)   LMP  (LMP Unknown)   SpO2 97% Comment: RA  BMI 25.45 kg/m   General appearance: alert, cooperative and no distress Head: Normocephalic, without obvious abnormality, atraumatic Neck: no adenopathy, no carotid bruit, no JVD, supple, symmetrical, trachea midline and thyroid not enlarged, symmetric, no tenderness/mass/nodules Lymph nodes: Cervical, supraclavicular, and axillary nodes normal. Resp: clear to auscultation  bilaterally Back: symmetric, no curvature. ROM normal. No CVA tenderness. Cardio: regular rate and rhythm, S1, S2 normal, no murmur, click, rub or gallop GI: soft, non-tender; bowel sounds normal; no masses,  no organomegaly Extremities: extremities normal, atraumatic, no cyanosis or edema Neurologic: Grossly normal   Diagnostic Studies & Laboratory data:     Recent Radiology Findings:  Ct Chest Wo Contrast  Result Date: 04/26/2018 CLINICAL DATA:  Right upper lobe lung adenocarcinoma status post right upper lobectomy 04/20/2017. Restaging. EXAM: CT CHEST WITHOUT CONTRAST TECHNIQUE: Multidetector CT imaging of the chest was performed following the standard protocol without IV  contrast. COMPARISON:  10/22/2017 chest CT. FINDINGS: Cardiovascular: Normal heart size. No significant pericardial effusion/thickening. Left anterior descending coronary atherosclerosis. Atherosclerotic nonaneurysmal thoracic aorta. Normal caliber pulmonary arteries. Mediastinum/Nodes: No discrete thyroid nodules. Unremarkable esophagus. No pathologically enlarged axillary, mediastinal or hilar lymph nodes, noting limited sensitivity for the detection of hilar adenopathy on this noncontrast study. Lungs/Pleura: No pneumothorax. No pleural effusion. Status post right upper lobectomy. No acute consolidative airspace disease or lung masses. Small parenchymal bands in the lingula and anterior left lower lobe, compatible with mild scarring or atelectasis. Two new subsolid right middle lobe pulmonary nodules, largest 6 mm (series 8/image 29). No additional significant pulmonary nodules. Upper abdomen: No acute abnormality. Musculoskeletal: No aggressive appearing focal osseous lesions. Mild thoracic spondylosis. IMPRESSION: 1. Two new subsolid right middle lobe pulmonary nodules, largest 6 mm, indeterminate. Recommend attention on follow-up chest CT in 3 months. 2. No thoracic adenopathy or other potential findings of metastatic disease in the chest. Aortic Atherosclerosis (ICD10-I70.0). Electronically Signed   By: Ilona Sorrel M.D.   On: 04/26/2018 13:02   I have independently reviewed the above radiology studies  and reviewed the findings with the patient.     Ct Chest Wo Contrast  Result Date: 10/22/2017 CLINICAL DATA:  Non-small cell lung cancer. Right upper lobe lesion removed April 20, 2017. EXAM: CT CHEST WITHOUT CONTRAST TECHNIQUE: Multidetector CT imaging of the chest was performed following the standard protocol without IV contrast. COMPARISON:  Chest CT 02/18/2017 and PET-CT 03/18/2017 FINDINGS: Cardiovascular: The heart is normal in size. No pericardial effusion. There is tortuosity, ectasia and  calcification of the thoracic aorta. Coronary artery calcifications are noted. Mediastinum/Nodes: No mediastinal or hilar mass or lymphadenopathy. The esophagus is grossly normal. Lungs/Pleura: Surgical changes from a right upper lobe lung resection. No findings for residual or recurrent tumor. No acute pulmonary process. No worrisome pulmonary lesions. No pleural effusion. Upper Abdomen: No significant upper abdominal findings. Musculoskeletal: No breast masses, supraclavicular or axillary lymphadenopathy. No significant bony findings. No lytic or sclerotic bone lesions to suggest metastatic disease. IMPRESSION: 1. Status post resection of a right upper lobe pulmonary lesion without findings for residual or recurrent tumor. 2. No mediastinal or hilar mass or lymphadenopathy. 3. No worrisome pulmonary lesions or acute pulmonary process. Aortic Atherosclerosis (ICD10-I70.0). Electronically Signed   By: Marijo Sanes M.D.   On: 10/22/2017 12:32    I have independently reviewed the above radiology studies  and reviewed the findings with the patient.   Recent Lab Findings: Lab Results  Component Value Date   WBC 8.5 04/29/2017   HGB 7.4 (L) 04/29/2017   HCT 22.3 (L) 04/29/2017   PLT 293 04/29/2017   GLUCOSE 101 (H) 05/01/2017   CHOL 168 02/18/2018   TRIG 50 02/18/2018   HDL 90 02/18/2018   LDLDIRECT 137 (H) 11/13/2017   LDLCALC 68 02/18/2018   ALT 21 02/18/2018  AST 27 04/22/2017   NA 135 05/01/2017   K 4.4 05/01/2017   CL 101 05/01/2017   CREATININE 0.73 05/01/2017   BUN 9 05/01/2017   CO2 25 05/01/2017   INR 0.96 04/16/2017      Assessment / Plan:  1/ Two new subsolid right middle lobe pulmonary nodules, largest 6 mm, indeterminate. 2/status post right upper lobectomy for stage Ia non-small cell carcinoma of the lung  We will plan on repeat CT scan of the chest between 3 and 4 months to evaluate the indeterminate right middle lobe pulmonary nodules.  Plan follow-up scan in late  October   Grace Isaac MD      [address] [place] [address] Office [phone]   Beeper [phone]  05/10/2018 4:41 PM

## 2018-05-21 DIAGNOSIS — M159 Polyosteoarthritis, unspecified: Secondary | ICD-10-CM | POA: Diagnosis not present

## 2018-05-21 DIAGNOSIS — I10 Essential (primary) hypertension: Secondary | ICD-10-CM | POA: Diagnosis not present

## 2018-05-24 DIAGNOSIS — M9905 Segmental and somatic dysfunction of pelvic region: Secondary | ICD-10-CM | POA: Diagnosis not present

## 2018-05-24 DIAGNOSIS — M9902 Segmental and somatic dysfunction of thoracic region: Secondary | ICD-10-CM | POA: Diagnosis not present

## 2018-05-24 DIAGNOSIS — M9903 Segmental and somatic dysfunction of lumbar region: Secondary | ICD-10-CM | POA: Diagnosis not present

## 2018-05-24 DIAGNOSIS — M545 Low back pain: Secondary | ICD-10-CM | POA: Diagnosis not present

## 2018-05-24 DIAGNOSIS — M546 Pain in thoracic spine: Secondary | ICD-10-CM | POA: Diagnosis not present

## 2018-05-24 IMAGING — CR DG CHEST 1V PORT
1 series · 1 of 1 positions shown · non-contrast
Comparison: April 21, 2017

CLINICAL DATA: Chest tube in place.  Shortness of Breath

EXAM:
PORTABLE CHEST 1 VIEW

[AP]
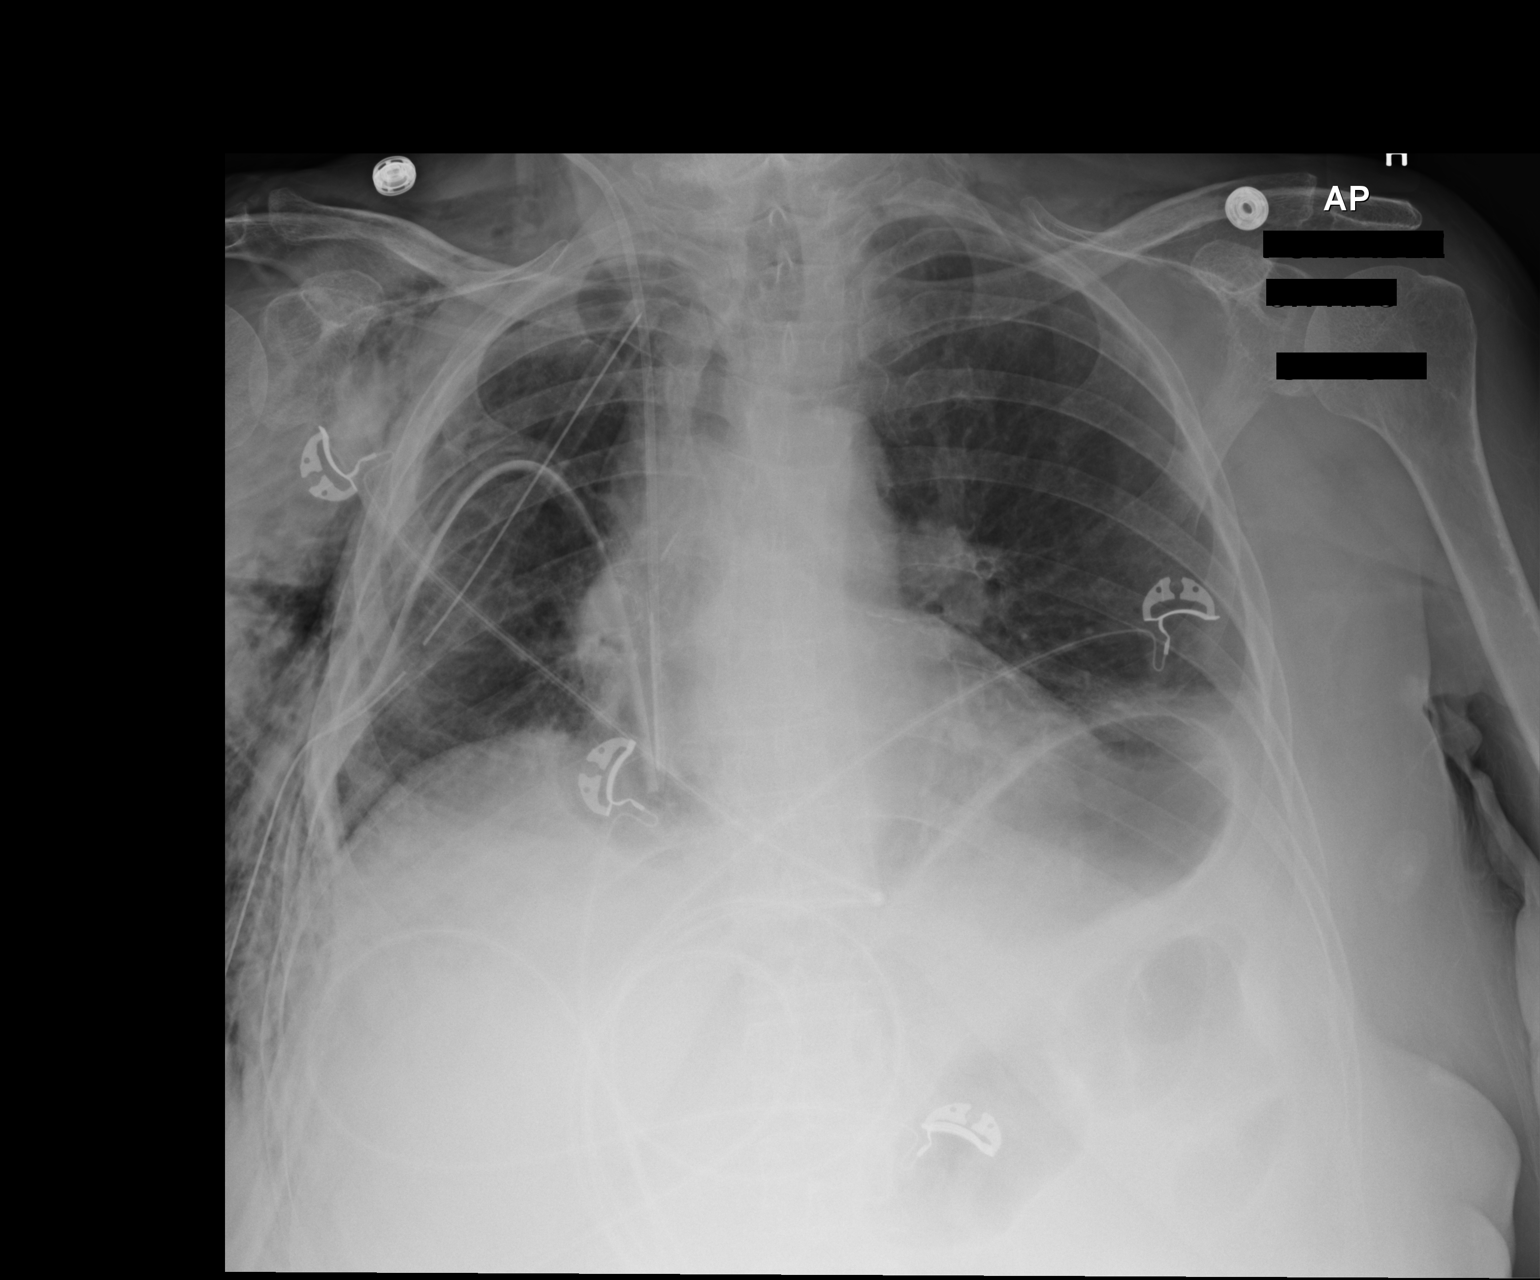

[1 of 1 positions shown; findings below may reference images not displayed]

FINDINGS: Chest tubes remain in place. There is currently a minimal right
apical pneumothorax. There is extensive soft tissue air on the
right, increased from most recent study. Central catheter tip is in
the right atrium, unchanged.

There is atelectatic change in the left base. Lungs elsewhere clear.
Heart size and pulmonary vascularity are normal. No adenopathy.
IMPRESSION: Increase in subcutaneous air compared to most recent study. Rather
minimal right apical pneumothorax. Chest tube positions do not
appear significantly changed. Central catheter position unchanged.
There is persistent atelectasis left base. Lungs elsewhere clear.
Stable cardiac silhouette.

## 2018-05-26 DIAGNOSIS — C3491 Malignant neoplasm of unspecified part of right bronchus or lung: Secondary | ICD-10-CM | POA: Diagnosis not present

## 2018-05-26 DIAGNOSIS — I10 Essential (primary) hypertension: Secondary | ICD-10-CM | POA: Diagnosis not present

## 2018-05-26 DIAGNOSIS — J329 Chronic sinusitis, unspecified: Secondary | ICD-10-CM | POA: Diagnosis not present

## 2018-05-26 DIAGNOSIS — Z299 Encounter for prophylactic measures, unspecified: Secondary | ICD-10-CM | POA: Diagnosis not present

## 2018-05-26 DIAGNOSIS — Z6825 Body mass index (BMI) 25.0-25.9, adult: Secondary | ICD-10-CM | POA: Diagnosis not present

## 2018-05-28 IMAGING — DX DG CHEST 1V PORT
1 series · 1 of 1 positions shown · non-contrast
Comparison: 04/25/2017.

CLINICAL DATA: Tube placement.

EXAM:
PORTABLE CHEST 1 VIEW

[chest ap]
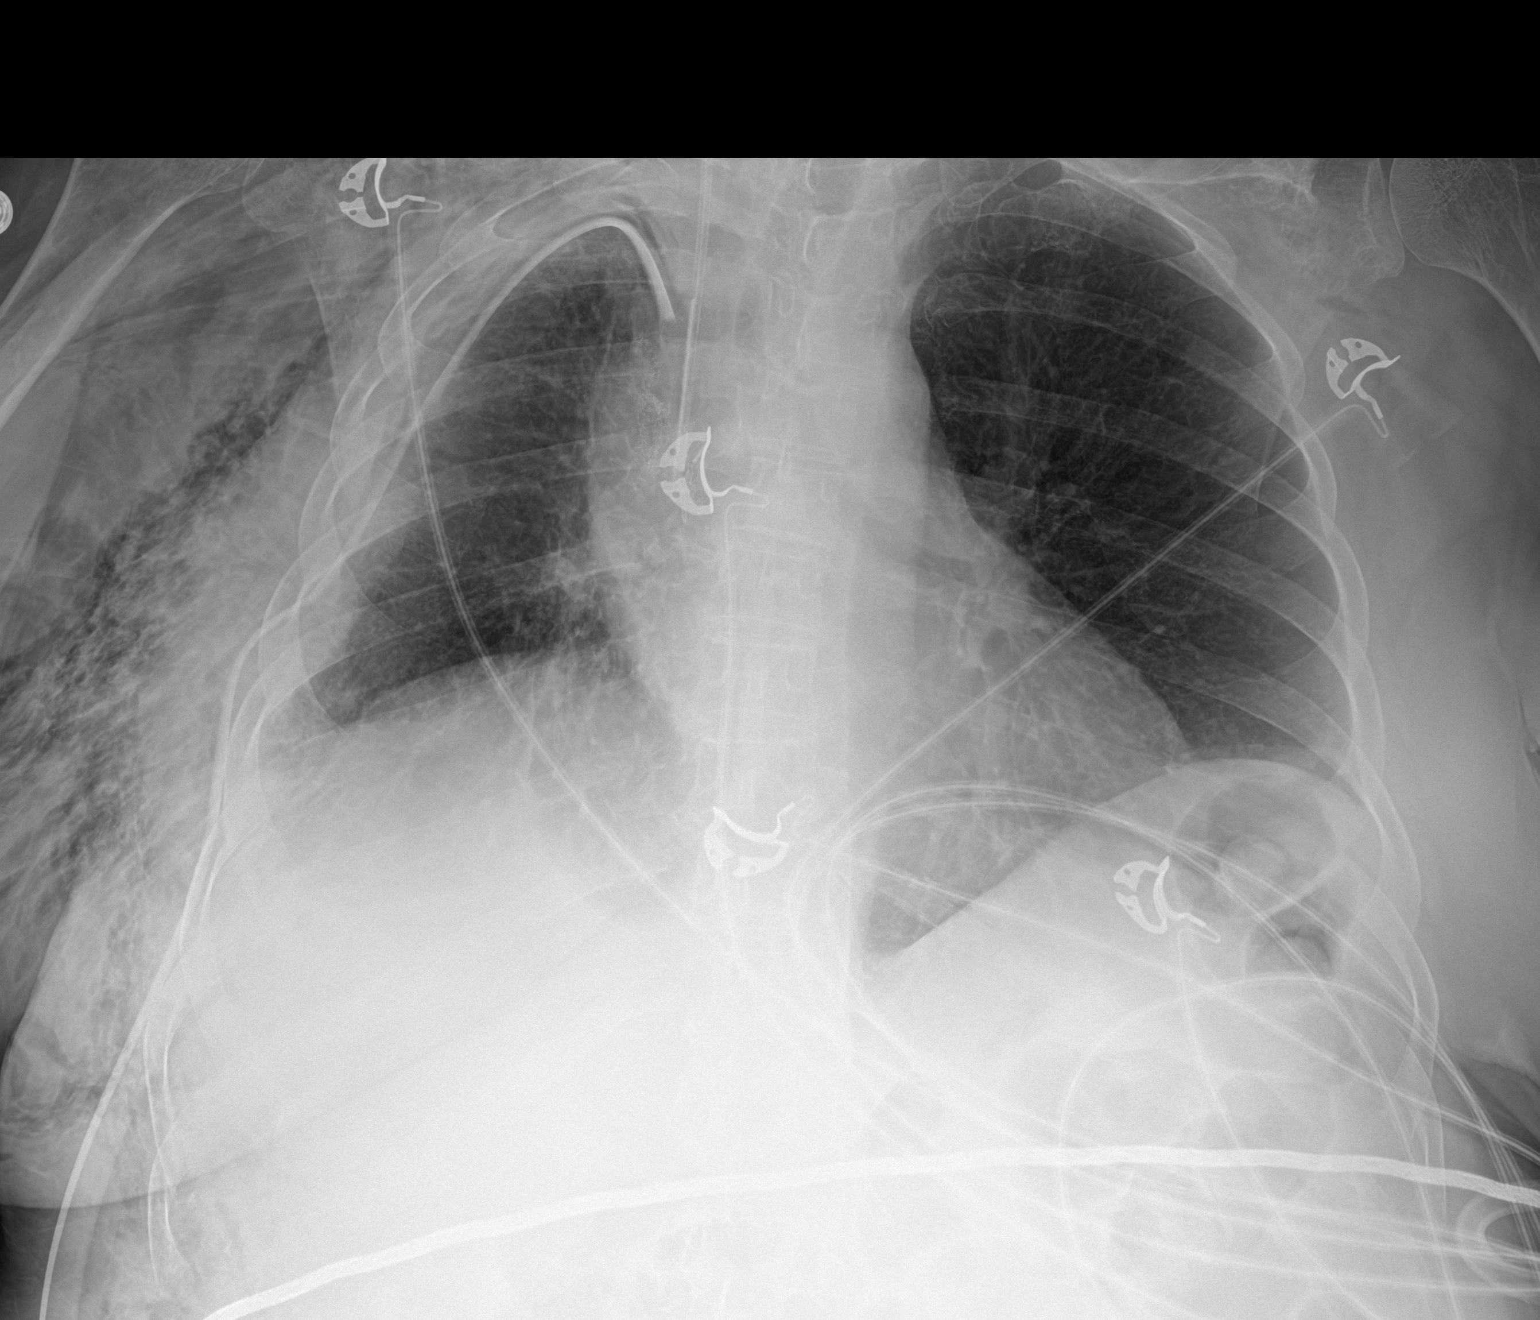

[1 of 1 positions shown; findings below may reference images not displayed]

FINDINGS: Single RIGHT chest tube remains. No significant pneumothorax.
Reduced RIGHT lung volumes. Extensive subcutaneous emphysema. LEFT
lung clear. Improved LEFT effusion and basilar atelectasis.
IMPRESSION: Improved aeration. Single RIGHT chest tube remains. No visible
pneumothorax. Extensive subcutaneous emphysema.

## 2018-05-29 IMAGING — DX DG CHEST 1V PORT
1 series · 1 of 1 positions shown · non-contrast
Comparison: 04/26/2017.

CLINICAL DATA: Pneumothorax.  Chest tube .

EXAM:
PORTABLE CHEST 1 VIEW

[chest]
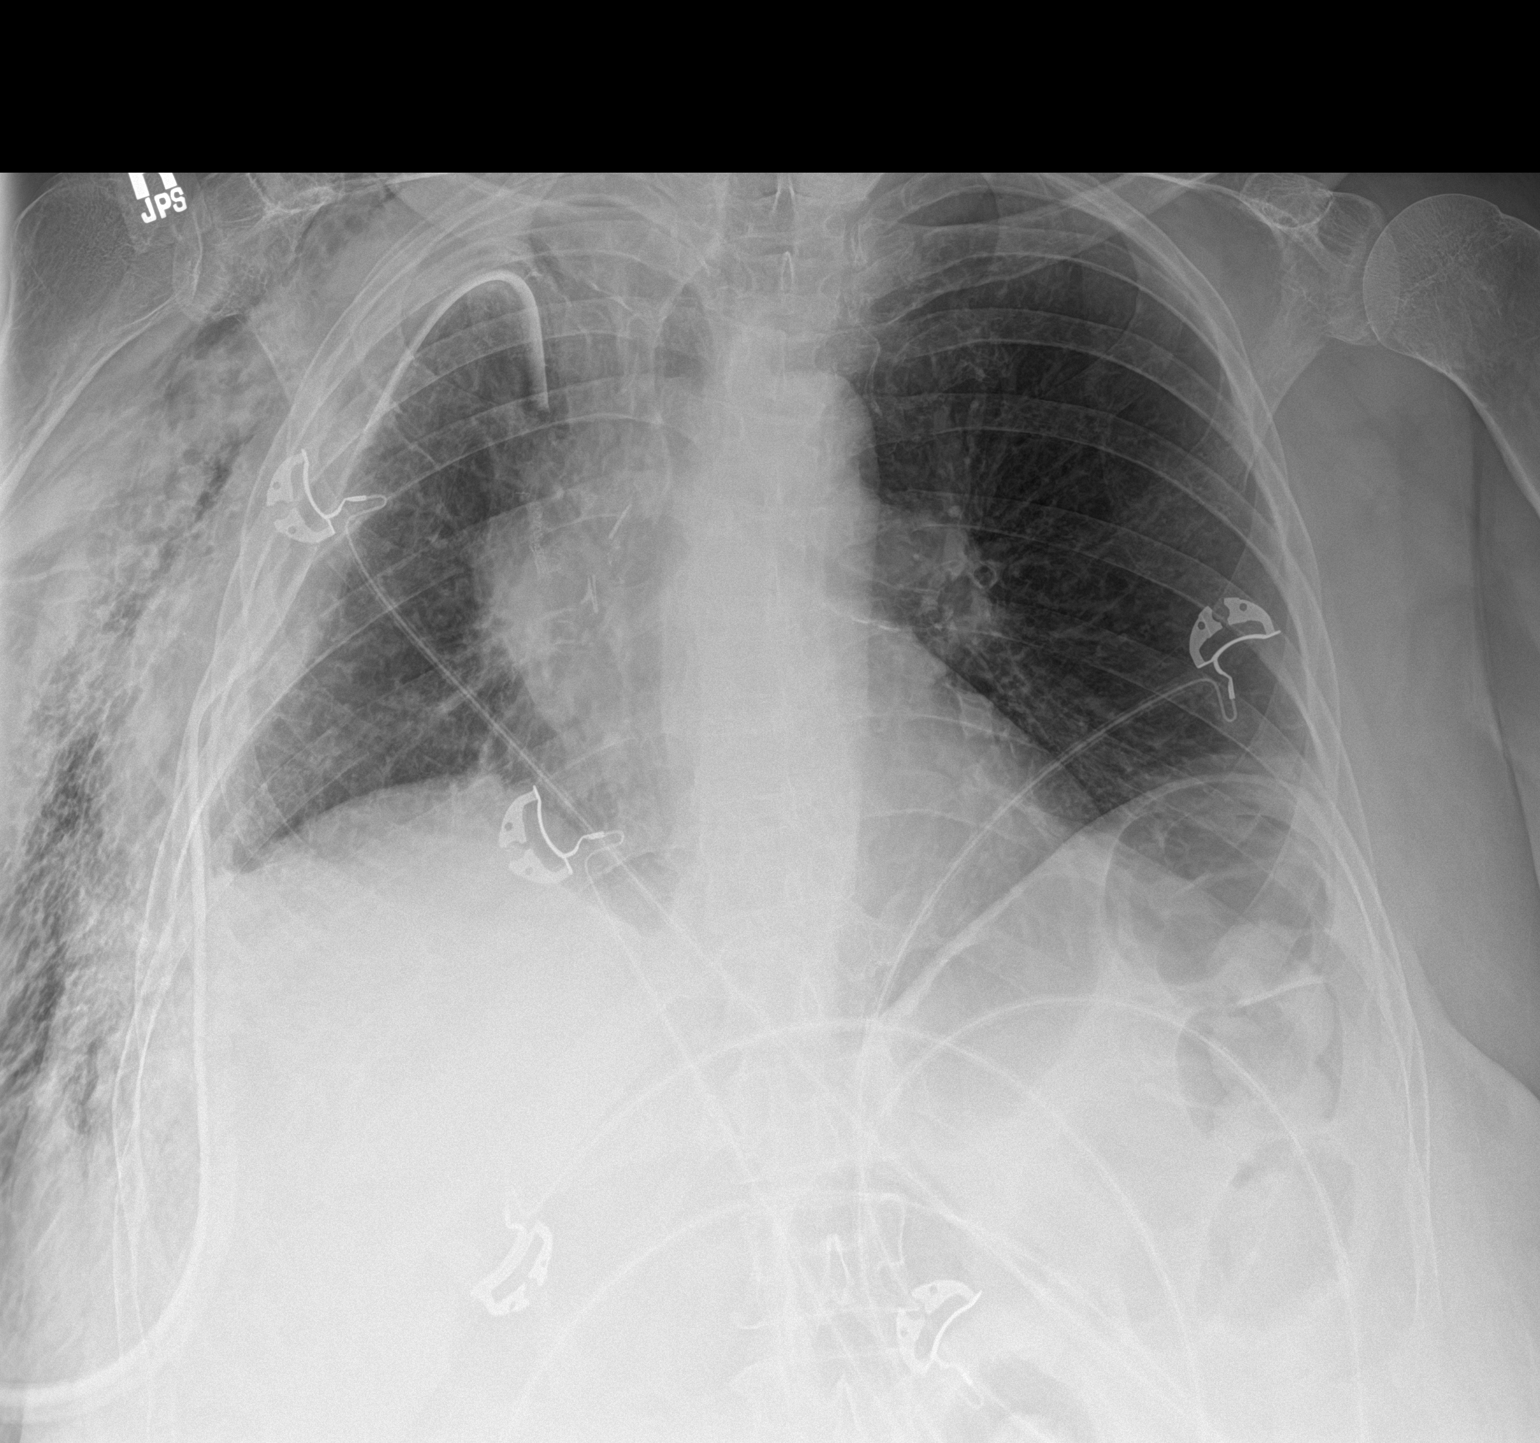

[1 of 1 positions shown; findings below may reference images not displayed]

FINDINGS: Postsurgical changes right chest. Right chest tube in stable
position. Interim removal of right IJ line. No definite pneumothorax
identified. Heart size stable . Extensive right chest wall
subcutaneous emphysema again noted.
IMPRESSION: Postsurgical changes right chest. Right chest tube in stable
position. No definite pneumothorax identified. Extensive right chest
wall subcutaneous emphysema again noted. Chest is stable from prior
exam.

## 2018-06-01 IMAGING — DX DG CHEST 1V PORT
1 series · 1 of 1 positions shown · non-contrast
Comparison: 04/29/2017

CLINICAL DATA: Follow-up right chest tube

EXAM:
PORTABLE CHEST 1 VIEW

[chest ap]
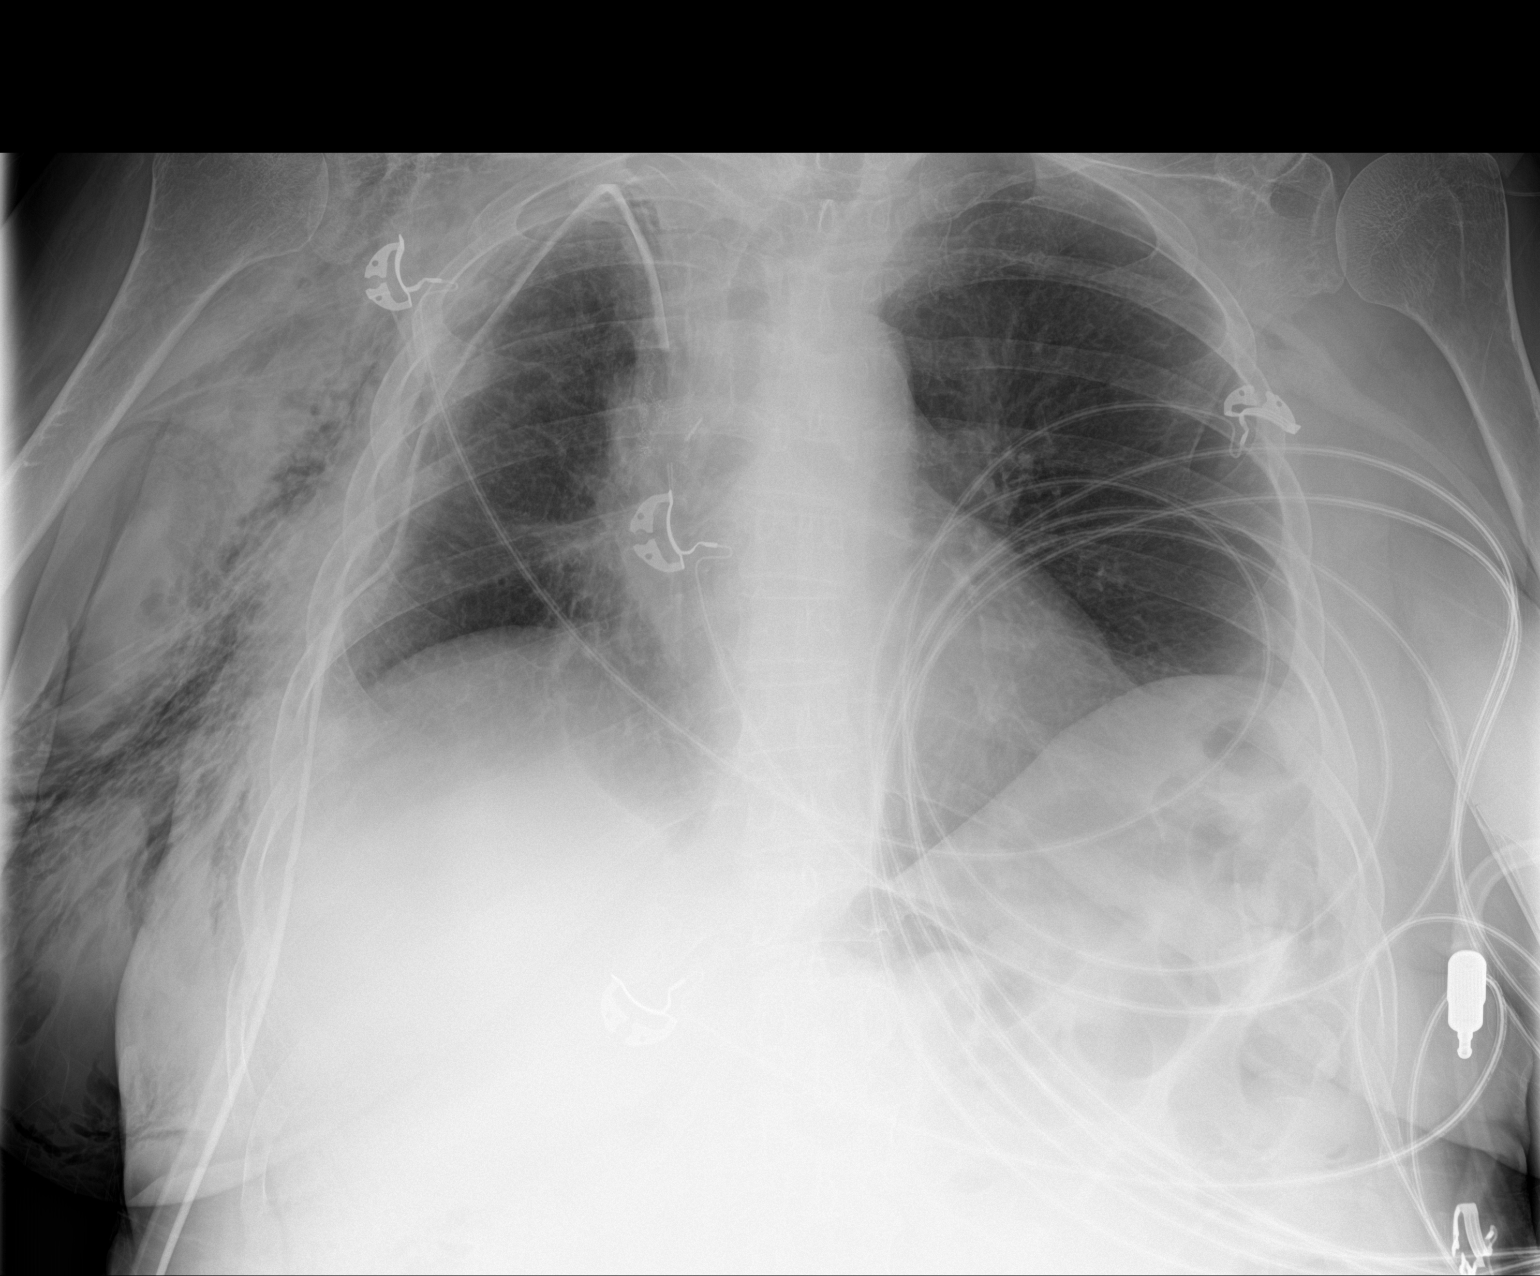

[1 of 1 positions shown; findings below may reference images not displayed]

FINDINGS: Postsurgical changes are again noted on the right. Cardiac shadow is
within normal limits. The lungs are well aerated bilaterally. Right
chest tube remains in place. No pneumothorax is noted.
IMPRESSION: No acute abnormality noted. No significant change from the prior
exam.

## 2018-06-02 IMAGING — DX DG CHEST 1V PORT
1 series · 1 of 1 positions shown · non-contrast
Comparison: Portable exam 0801 hours compared to 04/30/2017

CLINICAL DATA: Subcutaneous emphysema RIGHT chest wall, RIGHT chest
tube, hypertension

EXAM:
PORTABLE CHEST 1 VIEW

[chest]
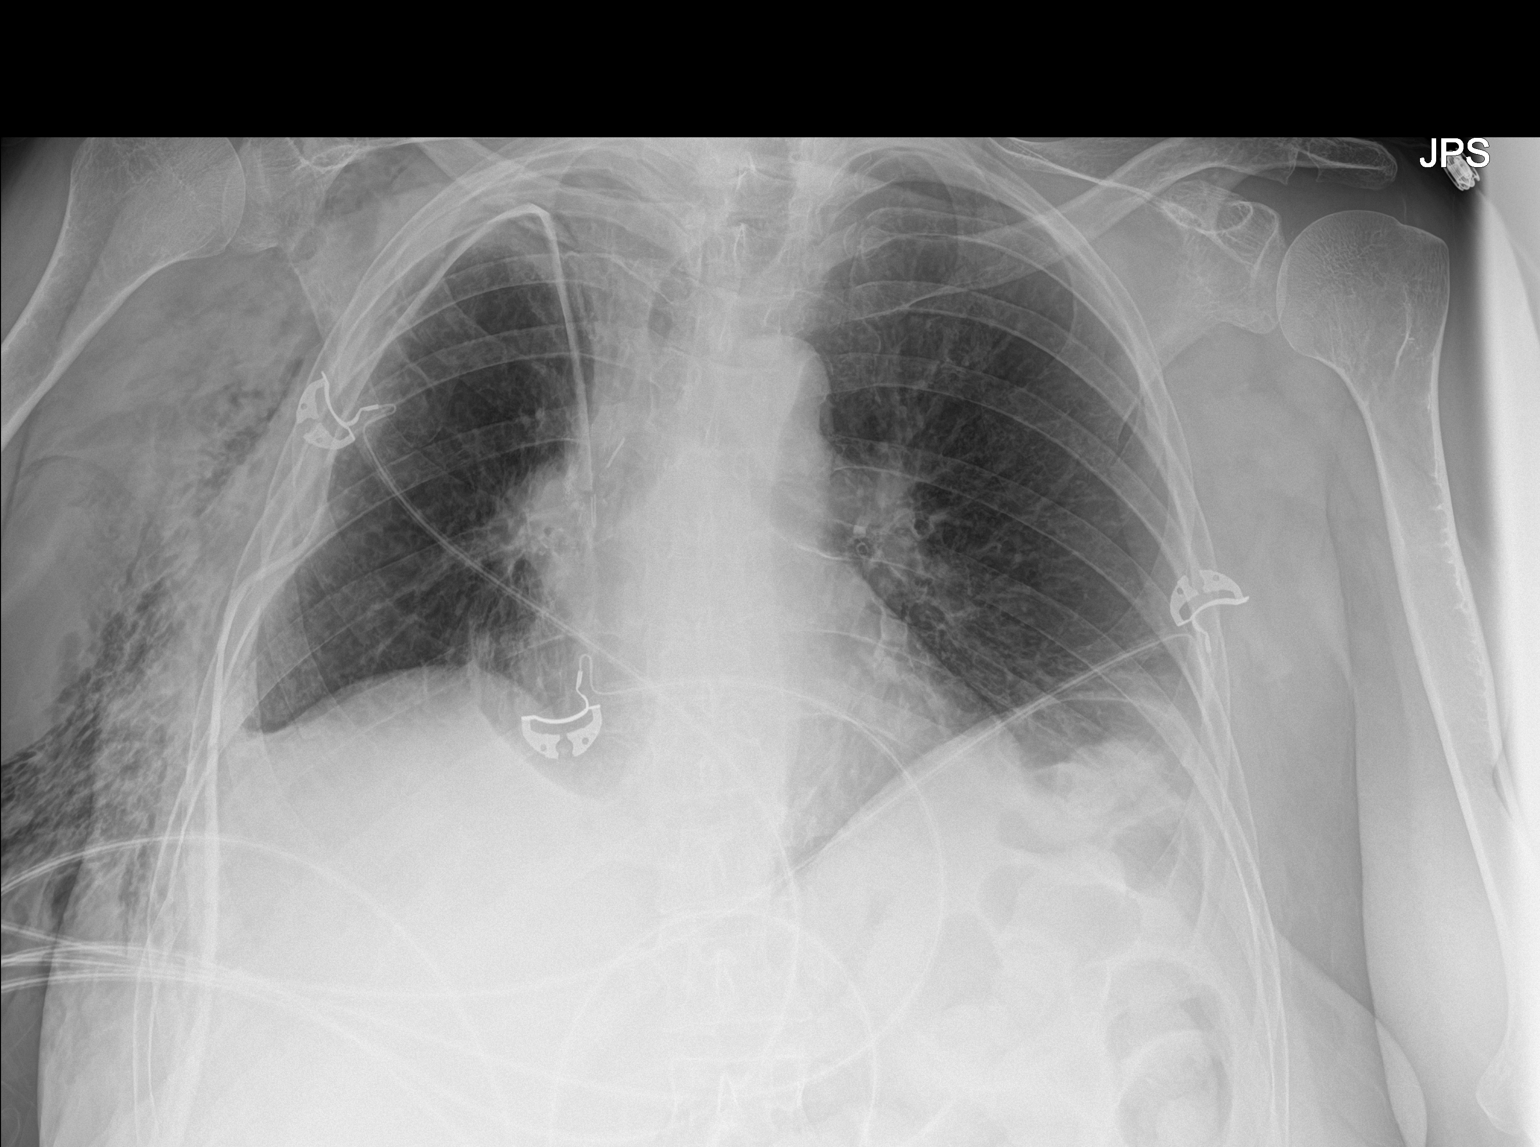

[1 of 1 positions shown; findings below may reference images not displayed]

FINDINGS: RIGHT thoracostomy tube unchanged.

Persistent significant RIGHT chest wall emphysema.

Normal heart size, mediastinal contours and pulmonary vascularity.

Bronchitic changes with volume loss in the RIGHT chest and prominent
RIGHT perihilar markings unchanged since previous exam consistent
with recent RIGHT upper lobectomy.

No acute infiltrate, pleural effusion or pneumothorax.
IMPRESSION: Postsurgical changes of the RIGHT hemithorax without pneumothorax.

Persistent RIGHT chest wall emphysema.

## 2018-06-16 DIAGNOSIS — Z6825 Body mass index (BMI) 25.0-25.9, adult: Secondary | ICD-10-CM | POA: Diagnosis not present

## 2018-06-16 DIAGNOSIS — R21 Rash and other nonspecific skin eruption: Secondary | ICD-10-CM | POA: Diagnosis not present

## 2018-06-16 DIAGNOSIS — R609 Edema, unspecified: Secondary | ICD-10-CM | POA: Diagnosis not present

## 2018-06-16 DIAGNOSIS — I10 Essential (primary) hypertension: Secondary | ICD-10-CM | POA: Diagnosis not present

## 2018-06-16 DIAGNOSIS — Z299 Encounter for prophylactic measures, unspecified: Secondary | ICD-10-CM | POA: Diagnosis not present

## 2018-06-17 DIAGNOSIS — I10 Essential (primary) hypertension: Secondary | ICD-10-CM | POA: Diagnosis not present

## 2018-06-17 DIAGNOSIS — M159 Polyosteoarthritis, unspecified: Secondary | ICD-10-CM | POA: Diagnosis not present

## 2018-06-21 DIAGNOSIS — Z6825 Body mass index (BMI) 25.0-25.9, adult: Secondary | ICD-10-CM | POA: Diagnosis not present

## 2018-06-21 DIAGNOSIS — R21 Rash and other nonspecific skin eruption: Secondary | ICD-10-CM | POA: Diagnosis not present

## 2018-06-21 DIAGNOSIS — Z299 Encounter for prophylactic measures, unspecified: Secondary | ICD-10-CM | POA: Diagnosis not present

## 2018-06-21 DIAGNOSIS — I10 Essential (primary) hypertension: Secondary | ICD-10-CM | POA: Diagnosis not present

## 2018-06-21 DIAGNOSIS — Z789 Other specified health status: Secondary | ICD-10-CM | POA: Diagnosis not present

## 2018-07-01 ENCOUNTER — Other Ambulatory Visit: Payer: Self-pay | Admitting: *Deleted

## 2018-07-01 DIAGNOSIS — C3490 Malignant neoplasm of unspecified part of unspecified bronchus or lung: Secondary | ICD-10-CM

## 2018-07-07 DIAGNOSIS — Z79899 Other long term (current) drug therapy: Secondary | ICD-10-CM | POA: Diagnosis not present

## 2018-07-07 DIAGNOSIS — Z7189 Other specified counseling: Secondary | ICD-10-CM | POA: Diagnosis not present

## 2018-07-07 DIAGNOSIS — R5383 Other fatigue: Secondary | ICD-10-CM | POA: Diagnosis not present

## 2018-07-07 DIAGNOSIS — C3491 Malignant neoplasm of unspecified part of right bronchus or lung: Secondary | ICD-10-CM | POA: Diagnosis not present

## 2018-07-07 DIAGNOSIS — Z1339 Encounter for screening examination for other mental health and behavioral disorders: Secondary | ICD-10-CM | POA: Diagnosis not present

## 2018-07-07 DIAGNOSIS — Z299 Encounter for prophylactic measures, unspecified: Secondary | ICD-10-CM | POA: Diagnosis not present

## 2018-07-07 DIAGNOSIS — Z23 Encounter for immunization: Secondary | ICD-10-CM | POA: Diagnosis not present

## 2018-07-07 DIAGNOSIS — I10 Essential (primary) hypertension: Secondary | ICD-10-CM | POA: Diagnosis not present

## 2018-07-07 DIAGNOSIS — Z1331 Encounter for screening for depression: Secondary | ICD-10-CM | POA: Diagnosis not present

## 2018-07-07 DIAGNOSIS — Z6824 Body mass index (BMI) 24.0-24.9, adult: Secondary | ICD-10-CM | POA: Diagnosis not present

## 2018-07-07 DIAGNOSIS — Z0000 Encounter for general adult medical examination without abnormal findings: Secondary | ICD-10-CM | POA: Diagnosis not present

## 2018-07-07 DIAGNOSIS — R69 Illness, unspecified: Secondary | ICD-10-CM | POA: Diagnosis not present

## 2018-07-07 DIAGNOSIS — E7800 Pure hypercholesterolemia, unspecified: Secondary | ICD-10-CM | POA: Diagnosis not present

## 2018-07-15 DIAGNOSIS — I10 Essential (primary) hypertension: Secondary | ICD-10-CM | POA: Diagnosis not present

## 2018-07-15 DIAGNOSIS — M159 Polyosteoarthritis, unspecified: Secondary | ICD-10-CM | POA: Diagnosis not present

## 2018-07-20 DIAGNOSIS — M545 Low back pain: Secondary | ICD-10-CM | POA: Diagnosis not present

## 2018-07-20 DIAGNOSIS — M9905 Segmental and somatic dysfunction of pelvic region: Secondary | ICD-10-CM | POA: Diagnosis not present

## 2018-07-20 DIAGNOSIS — M9902 Segmental and somatic dysfunction of thoracic region: Secondary | ICD-10-CM | POA: Diagnosis not present

## 2018-07-20 DIAGNOSIS — M9903 Segmental and somatic dysfunction of lumbar region: Secondary | ICD-10-CM | POA: Diagnosis not present

## 2018-07-20 DIAGNOSIS — M546 Pain in thoracic spine: Secondary | ICD-10-CM | POA: Diagnosis not present

## 2018-08-12 ENCOUNTER — Ambulatory Visit
Admission: RE | Admit: 2018-08-12 | Discharge: 2018-08-12 | Disposition: A | Discharge status: Home / Self Care | Payer: Medicare HMO | Source: Ambulatory Visit | Attending: Cardiothoracic Surgery | Admitting: Cardiothoracic Surgery

## 2018-08-12 ENCOUNTER — Ambulatory Visit: Payer: Medicare HMO | Admitting: Cardiothoracic Surgery

## 2018-08-12 VITALS — BP 134/72 | HR 70 | Resp 20 | Ht 59.0 in | Wt 127.0 lb

## 2018-08-12 DIAGNOSIS — L239 Allergic contact dermatitis, unspecified cause: Secondary | ICD-10-CM | POA: Insufficient documentation

## 2018-08-12 DIAGNOSIS — K4020 Bilateral inguinal hernia, without obstruction or gangrene, not specified as recurrent: Secondary | ICD-10-CM | POA: Insufficient documentation

## 2018-08-12 DIAGNOSIS — R350 Frequency of micturition: Secondary | ICD-10-CM | POA: Insufficient documentation

## 2018-08-12 DIAGNOSIS — C3492 Malignant neoplasm of unspecified part of left bronchus or lung: Secondary | ICD-10-CM

## 2018-08-12 DIAGNOSIS — R918 Other nonspecific abnormal finding of lung field: Secondary | ICD-10-CM | POA: Diagnosis not present

## 2018-08-12 DIAGNOSIS — H6980 Other specified disorders of Eustachian tube, unspecified ear: Secondary | ICD-10-CM | POA: Insufficient documentation

## 2018-08-12 DIAGNOSIS — R5383 Other fatigue: Secondary | ICD-10-CM | POA: Insufficient documentation

## 2018-08-12 DIAGNOSIS — R2681 Unsteadiness on feet: Secondary | ICD-10-CM | POA: Insufficient documentation

## 2018-08-12 DIAGNOSIS — Z5320 Procedure and treatment not carried out because of patient's decision for unspecified reasons: Secondary | ICD-10-CM | POA: Insufficient documentation

## 2018-08-12 DIAGNOSIS — R0989 Other specified symptoms and signs involving the circulatory and respiratory systems: Secondary | ICD-10-CM | POA: Insufficient documentation

## 2018-08-12 DIAGNOSIS — C3491 Malignant neoplasm of unspecified part of right bronchus or lung: Secondary | ICD-10-CM | POA: Insufficient documentation

## 2018-08-12 DIAGNOSIS — H532 Diplopia: Secondary | ICD-10-CM | POA: Insufficient documentation

## 2018-08-12 DIAGNOSIS — E559 Vitamin D deficiency, unspecified: Secondary | ICD-10-CM | POA: Insufficient documentation

## 2018-08-12 DIAGNOSIS — M25461 Effusion, right knee: Secondary | ICD-10-CM | POA: Insufficient documentation

## 2018-08-12 DIAGNOSIS — E2839 Other primary ovarian failure: Secondary | ICD-10-CM | POA: Insufficient documentation

## 2018-08-12 DIAGNOSIS — D649 Anemia, unspecified: Secondary | ICD-10-CM | POA: Insufficient documentation

## 2018-08-12 DIAGNOSIS — B370 Candidal stomatitis: Secondary | ICD-10-CM | POA: Insufficient documentation

## 2018-08-12 DIAGNOSIS — R911 Solitary pulmonary nodule: Secondary | ICD-10-CM | POA: Insufficient documentation

## 2018-08-12 DIAGNOSIS — R42 Dizziness and giddiness: Secondary | ICD-10-CM | POA: Insufficient documentation

## 2018-08-12 DIAGNOSIS — F419 Anxiety disorder, unspecified: Secondary | ICD-10-CM

## 2018-08-12 DIAGNOSIS — C3490 Malignant neoplasm of unspecified part of unspecified bronchus or lung: Secondary | ICD-10-CM

## 2018-08-12 DIAGNOSIS — R05 Cough: Secondary | ICD-10-CM | POA: Insufficient documentation

## 2018-08-12 DIAGNOSIS — F329 Major depressive disorder, single episode, unspecified: Secondary | ICD-10-CM | POA: Insufficient documentation

## 2018-08-12 DIAGNOSIS — F32A Depression, unspecified: Secondary | ICD-10-CM | POA: Insufficient documentation

## 2018-08-12 DIAGNOSIS — R059 Cough, unspecified: Secondary | ICD-10-CM | POA: Insufficient documentation

## 2018-08-12 DIAGNOSIS — Z902 Acquired absence of lung [part of]: Secondary | ICD-10-CM

## 2018-08-12 NOTE — Progress Notes (Signed)
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 SURGICAL PROCEDURE:  Video bronchoscopy, right video-assisted thoracoscopy, right upper lobectomy with lymph node dissection, and placement of On-Q.  Cancer Staging Malignant neoplasm of right upper lobe of lung (HCC) Staging form: Lung, AJCC 8th Edition - Pathologic stage from 04/21/2017: Stage IA2 (pT1b, pN0, cM0) - Signed by Grace Isaac, MD on 04/22/2017  History of Present Illness:     Patient returns to office after  right upper lobectomy for stage Ia adenocarcinoma of the lung 04/20/2018.  Since surgery for her stage I a 2 lung cancer in July 2018 she has had serial CT scans including January 2019 July 2019 and today.  On the CT in July 2 small lung nodules on the right were noted, on the follow-up scan one has disappeared and one grown possibly 2 mm.   The patient is asymptomatic, respiratory status is good, has an occasional cough no hemoptysis.      Past Medical History:  Diagnosis Date  . Anxiety   . Dyspnea    with exertion  . 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 Family history of adverse reaction to anesthesia    Children - N/V  . History of blood transfusion   . Hyperlipidemia   . Other osteoporosis   . Other thalassemia ([place])      Social History   Tobacco Use  Smoking Status Never Smoker  Smokeless Tobacco Never Used    Social History   Substance and Sexual Activity  Alcohol Use No     Allergies  Allergen Reactions  . Lisinopril Cough    Current Outpatient Medications    Medication Sig Dispense Refill  . acetaminophen (TYLENOL) 325 MG tablet Take 2 tablets (650 mg total) by mouth every 6 (six) hours as needed for mild pain.    Marland Kitchen amLODipine (NORVASC) 10 MG tablet Take 10 mg by mouth daily.    Marland Kitchen aspirin EC 81 MG tablet Take 81 mg by mouth daily.    . Calcium-Phosphorus-Vitamin D [id number] MG-MG-UNIT CHEW Chew 1 each by mouth 2 (two) times daily.     . hydrochlorothiazide (HYDRODIURIL) 25 MG tablet Take 1 tablet (25 mg total) by mouth daily. 93 tablet 4  . Multiple Vitamin (MULTIVITAMIN WITH MINERALS) TABS tablet Take 1 tablet by mouth daily.    . pantoprazole (PROTONIX) 20 MG tablet Take 20 mg by mouth daily.    . rosuvastatin (CRESTOR) 5 MG tablet Take 1 tablet (5 mg total) by mouth daily. 90 tablet 3   No current facility-administered medications for this visit.        Physical Exam: BP 134/72   Pulse 70   Resp 20   Ht 4\' 11"  (1.499 m)   Wt 127 lb (57.6 kg)   LMP  (LMP Unknown)   SpO2 98% Comment: RA  BMI 25.65 kg/m   General appearance: alert, cooperative and no distress Head: Normocephalic, without obvious abnormality, atraumatic Neck: no adenopathy, no carotid bruit, no JVD, supple,  symmetrical, trachea midline and thyroid not enlarged, symmetric, no tenderness/mass/nodules Lymph nodes: Cervical, supraclavicular, and axillary nodes normal. Resp: clear to auscultation bilaterally Back: symmetric, no curvature. ROM normal. No CVA tenderness. Cardio: regular rate and rhythm, S1, S2 normal, no murmur, click, rub or gallop GI: soft, non-tender; bowel sounds normal; no masses,  no organomegaly Extremities: extremities normal, atraumatic, no cyanosis or edema and Homans sign is negative, no sign of DVT Neurologic: Grossly normal  Diagnostic Studies & Laboratory data:     Recent Radiology Findings:   Ct Chest Wo Contrast  Result Date: 08/12/2018 CLINICAL DATA:  Right upper lobe lung adenocarcinoma status post right upper lobectomy  04/20/2017. Restaging. EXAM: CT CHEST WITHOUT CONTRAST TECHNIQUE: Multidetector CT imaging of the chest was performed following the standard protocol without IV contrast. COMPARISON:  04/26/2018 chest CT. FINDINGS: Cardiovascular: Normal heart size. No significant pericardial effusion/thickening. Left anterior descending coronary atherosclerosis. Atherosclerotic nonaneurysmal thoracic aorta. Normal caliber pulmonary arteries. Mediastinum/Nodes: No discrete thyroid nodules. Unremarkable esophagus. No pathologically enlarged axillary, mediastinal or hilar lymph nodes, noting limited sensitivity for the detection of hilar adenopathy on this noncontrast study. Lungs/Pleura: No pneumothorax. No pleural effusion. Status post right upper lobectomy. There is an 8 mm solid right middle lobe pulmonary nodule (series 8/image 39), increased from 5 mm on 04/26/2018 chest CT. The previously described separate subsolid 6 mm right middle lobe pulmonary nodule has resolved. No acute consolidative airspace disease, lung masses or new significant pulmonary nodules. Scattered parenchymal bands in the lingula and left lower lobe are stable and compatible with postinfectious/postinflammatory scarring. Upper abdomen: No acute abnormality. Musculoskeletal: No aggressive appearing focal osseous lesions. Mild thoracic spondylosis. IMPRESSION: 1. One of the two previously visualized right middle lobe pulmonary nodules has demonstrated mild interval growth, now 8 mm and solid density. Pulmonary metastasis cannot be excluded. Continued close chest CT surveillance recommended at a minimum. 2. The other previously visualized right middle lobe subsolid pulmonary nodule has resolved. 3. No thoracic adenopathy or other potential findings of metastatic disease in the chest. Aortic Atherosclerosis (ICD10-I70.0). Electronically Signed   By: Ilona Sorrel M.D.   On: 08/12/2018 11:32   Ct Chest Wo Contrast  Result Date: 04/26/2018 CLINICAL DATA:   Right upper lobe lung adenocarcinoma status post right upper lobectomy 04/20/2017. Restaging. EXAM: CT CHEST WITHOUT CONTRAST TECHNIQUE: Multidetector CT imaging of the chest was performed following the standard protocol without IV contrast. COMPARISON:  10/22/2017 chest CT. FINDINGS: Cardiovascular: Normal heart size. No significant pericardial effusion/thickening. Left anterior descending coronary atherosclerosis. Atherosclerotic nonaneurysmal thoracic aorta. Normal caliber pulmonary arteries. Mediastinum/Nodes: No discrete thyroid nodules. Unremarkable esophagus. No pathologically enlarged axillary, mediastinal or hilar lymph nodes, noting limited sensitivity for the detection of hilar adenopathy on this noncontrast study. Lungs/Pleura: No pneumothorax. No pleural effusion. Status post right upper lobectomy. No acute consolidative airspace disease or lung masses. Small parenchymal bands in the lingula and anterior left lower lobe, compatible with mild scarring or atelectasis. Two new subsolid right middle lobe pulmonary nodules, largest 6 mm (series 8/image 29). No additional significant pulmonary nodules. Upper abdomen: No acute abnormality. Musculoskeletal: No aggressive appearing focal osseous lesions. Mild thoracic spondylosis. IMPRESSION: 1. Two new subsolid right middle lobe pulmonary nodules, largest 6 mm, indeterminate. Recommend attention on follow-up chest CT in 3 months. 2. No thoracic adenopathy or other potential findings of metastatic disease in the chest. Aortic Atherosclerosis (ICD10-I70.0). Electronically Signed   By: Ilona Sorrel M.D.   On: 04/26/2018 13:02   I have independently reviewed the  above radiology studies  and reviewed the findings with the patient.     Ct Chest Wo Contrast  Result Date: 10/22/2017 CLINICAL DATA:  Non-small cell lung cancer. Right upper lobe lesion removed April 20, 2017. EXAM: CT CHEST WITHOUT CONTRAST TECHNIQUE: Multidetector CT imaging of the chest was  performed following the standard protocol without IV contrast. COMPARISON:  Chest CT 02/18/2017 and PET-CT 03/18/2017 FINDINGS: Cardiovascular: The heart is normal in size. No pericardial effusion. There is tortuosity, ectasia and calcification of the thoracic aorta. Coronary artery calcifications are noted. Mediastinum/Nodes: No mediastinal or hilar mass or lymphadenopathy. The esophagus is grossly normal. Lungs/Pleura: Surgical changes from a right upper lobe lung resection. No findings for residual or recurrent tumor. No acute pulmonary process. No worrisome pulmonary lesions. No pleural effusion. Upper Abdomen: No significant upper abdominal findings. Musculoskeletal: No breast masses, supraclavicular or axillary lymphadenopathy. No significant bony findings. No lytic or sclerotic bone lesions to suggest metastatic disease. IMPRESSION: 1. Status post resection of a right upper lobe pulmonary lesion without findings for residual or recurrent tumor. 2. No mediastinal or hilar mass or lymphadenopathy. 3. No worrisome pulmonary lesions or acute pulmonary process. Aortic Atherosclerosis (ICD10-I70.0). Electronically Signed   By: Marijo Sanes M.D.   On: 10/22/2017 12:32    I have independently reviewed the above radiology studies  and reviewed the findings with the patient.   Recent Lab Findings: Lab Results  Component Value Date   WBC 8.5 04/29/2017   HGB 7.4 (L) 04/29/2017   HCT 22.3 (L) 04/29/2017   PLT 293 04/29/2017   GLUCOSE 101 (H) 05/01/2017   CHOL 168 02/18/2018   TRIG 50 02/18/2018   HDL 90 02/18/2018   LDLDIRECT 137 (H) 11/13/2017   LDLCALC 68 02/18/2018   ALT 21 02/18/2018   AST 27 04/22/2017   NA 135 05/01/2017   K 4.4 05/01/2017   CL 101 05/01/2017   CREATININE 0.73 05/01/2017   BUN 9 05/01/2017   CO2 25 05/01/2017   INR 0.96 04/16/2017      Assessment / Plan:  1/ Two new subsolid right middle lobe pulmonary nodules, noted on scan in July 2019, one nodule has disappeared  one has increased in size approaching 2 mm.  There is no concomitant adenopathy associated.  I reviewed the findings with the patient.  At the current size the mass would be borderline limit for use of PET scan and potentially difficult to biopsy.  After discussing the findings with the patient, we will repeat a CT scan of the chest in 3 months, if the right middle lobe nodule continues to slowly increase in size we will obtain a PET scan and consider needle biopsy CT directed.  2/status post right upper lobectomy for stage Ia non-small cell carcinoma of the lung  Grace Isaac MD      [address] [place], [address] Office [phone]   Beeper [phone]  08/12/2018 12:23 PM

## 2018-09-06 DIAGNOSIS — M2042 Other hammer toe(s) (acquired), left foot: Secondary | ICD-10-CM | POA: Diagnosis not present

## 2018-09-06 DIAGNOSIS — M79675 Pain in left toe(s): Secondary | ICD-10-CM | POA: Diagnosis not present

## 2018-09-06 DIAGNOSIS — L851 Acquired keratosis [keratoderma] palmaris et plantaris: Secondary | ICD-10-CM | POA: Diagnosis not present

## 2018-09-07 DIAGNOSIS — I10 Essential (primary) hypertension: Secondary | ICD-10-CM | POA: Diagnosis not present

## 2018-09-07 DIAGNOSIS — M159 Polyosteoarthritis, unspecified: Secondary | ICD-10-CM | POA: Diagnosis not present

## 2018-09-22 ENCOUNTER — Other Ambulatory Visit: Payer: Self-pay | Admitting: Cardiothoracic Surgery

## 2018-09-22 DIAGNOSIS — C3411 Malignant neoplasm of upper lobe, right bronchus or lung: Secondary | ICD-10-CM

## 2018-09-28 DIAGNOSIS — R69 Illness, unspecified: Secondary | ICD-10-CM | POA: Diagnosis not present

## 2018-10-05 DIAGNOSIS — M159 Polyosteoarthritis, unspecified: Secondary | ICD-10-CM | POA: Diagnosis not present

## 2018-10-05 DIAGNOSIS — I10 Essential (primary) hypertension: Secondary | ICD-10-CM | POA: Diagnosis not present

## 2018-10-11 DIAGNOSIS — M542 Cervicalgia: Secondary | ICD-10-CM | POA: Diagnosis not present

## 2018-10-11 DIAGNOSIS — M9902 Segmental and somatic dysfunction of thoracic region: Secondary | ICD-10-CM | POA: Diagnosis not present

## 2018-10-11 DIAGNOSIS — M546 Pain in thoracic spine: Secondary | ICD-10-CM | POA: Diagnosis not present

## 2018-10-11 DIAGNOSIS — M9905 Segmental and somatic dysfunction of pelvic region: Secondary | ICD-10-CM | POA: Diagnosis not present

## 2018-10-11 DIAGNOSIS — M9903 Segmental and somatic dysfunction of lumbar region: Secondary | ICD-10-CM | POA: Diagnosis not present

## 2018-10-11 DIAGNOSIS — M545 Low back pain: Secondary | ICD-10-CM | POA: Diagnosis not present

## 2018-10-11 DIAGNOSIS — M9901 Segmental and somatic dysfunction of cervical region: Secondary | ICD-10-CM | POA: Diagnosis not present

## 2018-10-14 DIAGNOSIS — Z299 Encounter for prophylactic measures, unspecified: Secondary | ICD-10-CM | POA: Diagnosis not present

## 2018-10-14 DIAGNOSIS — Z6825 Body mass index (BMI) 25.0-25.9, adult: Secondary | ICD-10-CM | POA: Diagnosis not present

## 2018-10-14 DIAGNOSIS — R69 Illness, unspecified: Secondary | ICD-10-CM | POA: Diagnosis not present

## 2018-10-14 DIAGNOSIS — I10 Essential (primary) hypertension: Secondary | ICD-10-CM | POA: Diagnosis not present

## 2018-10-14 DIAGNOSIS — E7800 Pure hypercholesterolemia, unspecified: Secondary | ICD-10-CM | POA: Diagnosis not present

## 2018-10-18 DIAGNOSIS — M9901 Segmental and somatic dysfunction of cervical region: Secondary | ICD-10-CM | POA: Diagnosis not present

## 2018-10-18 DIAGNOSIS — M545 Low back pain: Secondary | ICD-10-CM | POA: Diagnosis not present

## 2018-10-18 DIAGNOSIS — M542 Cervicalgia: Secondary | ICD-10-CM | POA: Diagnosis not present

## 2018-10-18 DIAGNOSIS — M9903 Segmental and somatic dysfunction of lumbar region: Secondary | ICD-10-CM | POA: Diagnosis not present

## 2018-10-18 DIAGNOSIS — M546 Pain in thoracic spine: Secondary | ICD-10-CM | POA: Diagnosis not present

## 2018-10-18 DIAGNOSIS — M9905 Segmental and somatic dysfunction of pelvic region: Secondary | ICD-10-CM | POA: Diagnosis not present

## 2018-10-18 DIAGNOSIS — M9902 Segmental and somatic dysfunction of thoracic region: Secondary | ICD-10-CM | POA: Diagnosis not present

## 2018-10-26 DIAGNOSIS — E2839 Other primary ovarian failure: Secondary | ICD-10-CM | POA: Diagnosis not present

## 2018-10-26 DIAGNOSIS — M810 Age-related osteoporosis without current pathological fracture: Secondary | ICD-10-CM | POA: Diagnosis not present

## 2018-11-02 DIAGNOSIS — I10 Essential (primary) hypertension: Secondary | ICD-10-CM | POA: Diagnosis not present

## 2018-11-02 DIAGNOSIS — M159 Polyosteoarthritis, unspecified: Secondary | ICD-10-CM | POA: Diagnosis not present

## 2018-11-11 ENCOUNTER — Ambulatory Visit
Admission: RE | Admit: 2018-11-11 | Discharge: 2018-11-11 | Disposition: A | Discharge status: Home / Self Care | Payer: Medicare HMO | Source: Ambulatory Visit | Attending: Cardiothoracic Surgery | Admitting: Cardiothoracic Surgery

## 2018-11-11 ENCOUNTER — Ambulatory Visit: Payer: Medicare HMO | Admitting: Cardiothoracic Surgery

## 2018-11-11 VITALS — BP 112/60 | HR 63 | Resp 20 | Ht 59.0 in | Wt 130.0 lb

## 2018-11-11 DIAGNOSIS — Z85118 Personal history of other malignant neoplasm of bronchus and lung: Secondary | ICD-10-CM | POA: Diagnosis not present

## 2018-11-11 DIAGNOSIS — C3490 Malignant neoplasm of unspecified part of unspecified bronchus or lung: Secondary | ICD-10-CM | POA: Diagnosis not present

## 2018-11-11 DIAGNOSIS — R918 Other nonspecific abnormal finding of lung field: Secondary | ICD-10-CM

## 2018-11-11 DIAGNOSIS — Z902 Acquired absence of lung [part of]: Secondary | ICD-10-CM

## 2018-11-11 DIAGNOSIS — C3411 Malignant neoplasm of upper lobe, right bronchus or lung: Secondary | ICD-10-CM

## 2018-11-11 NOTE — Progress Notes (Signed)
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 SURGICAL PROCEDURE:  Video bronchoscopy, right video-assisted thoracoscopy, right upper lobectomy with lymph node dissection, and placement of On-Q.  Cancer Staging Malignant neoplasm of right upper lobe of lung (HCC) Staging form: Lung, AJCC 8th Edition - Pathologic stage from 04/21/2017: Stage IA2 (pT1b, pN0, cM0) - Signed by Grace Isaac, MD on 04/22/2017  History of Present Illness:     Patient returns to office after  right upper lobectomy for stage Ia adenocarcinoma of the lung .  Since surgery for her stage I a 2 lung cancer in July 2018 she has had serial CT scans including January 2019 July 2019 and  Oct 2019 and today   On the CT in April 13 2018 small lung nodules on the right were noted, on the follow-up scan one has disappeared and one grown possibly 2 mm.   Patient is asymptomatic, respiratory status is good, she denies cough or hemoptysis.      Past Medical History:  Diagnosis Date  . Anxiety   . Dyspnea    with exertion  . 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 Family history of adverse reaction to anesthesia    Children - N/V  . History of blood transfusion   . Hyperlipidemia   . Other osteoporosis   . Other thalassemia ([place])      Social History   Tobacco Use  Smoking Status Never Smoker  Smokeless Tobacco Never Used    Social History   Substance and Sexual Activity  Alcohol Use No     Allergies  Allergen Reactions  . Lisinopril Cough    Current Outpatient Medications    Medication Sig Dispense Refill  . acetaminophen (TYLENOL) 325 MG tablet Take 2 tablets (650 mg total) by mouth every 6 (six) hours as needed for mild pain.    Marland Kitchen amLODipine (NORVASC) 10 MG tablet Take 10 mg by mouth daily.    Marland Kitchen aspirin EC 81 MG tablet Take 81 mg by mouth daily.    . Calcium-Phosphorus-Vitamin D [id number] MG-MG-UNIT CHEW Chew 1 each by mouth 2 (two) times daily.     . hydrochlorothiazide (HYDRODIURIL) 25 MG tablet Take 1 tablet (25 mg total) by mouth daily. 93 tablet 4  . Multiple Vitamin (MULTIVITAMIN WITH MINERALS) TABS tablet Take 1 tablet by mouth daily.    . rosuvastatin (CRESTOR) 5 MG tablet Take 1 tablet (5 mg total) by mouth daily. 90 tablet 3   No current facility-administered medications for this visit.        Physical Exam: BP 112/60   Pulse 63   Resp 20   Ht 4\' 11"  (1.499 m)   Wt 130 lb (59 kg)   LMP  (LMP Unknown)   SpO2 98% Comment: RA  BMI 26.26 kg/m  General appearance: alert, cooperative, appears stated age and no distress Head: Normocephalic, without obvious abnormality, atraumatic Lymph nodes: Cervical, supraclavicular, and axillary nodes normal. Resp: clear to auscultation bilaterally Cardio: regular rate and rhythm,  S1, S2 normal, no murmur, click, rub or gallop GI: soft, non-tender; bowel sounds normal; no masses,  no organomegaly Extremities: extremities normal, atraumatic, no cyanosis or edema and Homans sign is negative, no sign of DVT Neurologic: Grossly normal  Diagnostic Studies & Laboratory data:     Recent Radiology Findings:  Ct Super D Chest Wo Contrast  Result Date: 11/11/2018 CLINICAL DATA:  Right lung cancer. EXAM: CT CHEST WITHOUT CONTRAST TECHNIQUE: Multidetector CT imaging of the chest was performed using thin slice collimation for electromagnetic bronchoscopy planning purposes, without intravenous contrast. COMPARISON:  08/12/2018 FINDINGS: Cardiovascular: The heart size is normal. No substantial pericardial effusion.  Coronary artery calcification is evident. Atherosclerotic calcification is noted in the wall of the thoracic aorta. Mediastinum/Nodes: No mediastinal lymphadenopathy. No evidence for gross hilar lymphadenopathy although assessment is limited by the lack of intravenous contrast on today's study. The esophagus has normal imaging features. There is no axillary lymphadenopathy. Lungs/Pleura: The central tracheobronchial airways are patent. Surgical changes again noted in the right lung. 8 mm right middle lobe nodule seen on the previous study has almost completely resolved in the interval with only a subtle 4 mm ground-glass opacity at this location today. No new suspicious nodule or mass. Chronic atelectasis or scarring in the lingula and left lower lobe, similar to prior. No pleural effusion. Upper Abdomen: Unremarkable. Musculoskeletal: No worrisome lytic or sclerotic osseous abnormality. IMPRESSION: 1. 8 mm right middle lobe nodule seen on the previous study has almost completely resolved in the interval with only a subtle 4 mm ground-glass opacity at this location today. 2. No new suspicious nodule or mass. 3.  Aortic Atherosclerois (ICD10-170.0) Electronically Signed   By: Misty Stanley M.D.   On: 11/11/2018 12:41   I have independently reviewed the above radiology studies  and reviewed the findings with the patient.   Ct Chest Wo Contrast  Result Date: 08/12/2018 CLINICAL DATA:  Right upper lobe lung adenocarcinoma status post right upper lobectomy 04/20/2017. Restaging. EXAM: CT CHEST WITHOUT CONTRAST TECHNIQUE: Multidetector CT imaging of the chest was performed following the standard protocol without IV contrast. COMPARISON:  04/26/2018 chest CT. FINDINGS: Cardiovascular: Normal heart size. No significant pericardial effusion/thickening. Left anterior descending coronary atherosclerosis. Atherosclerotic nonaneurysmal thoracic aorta. Normal caliber pulmonary arteries. Mediastinum/Nodes: No discrete  thyroid nodules. Unremarkable esophagus. No pathologically enlarged axillary, mediastinal or hilar lymph nodes, noting limited sensitivity for the detection of hilar adenopathy on this noncontrast study. Lungs/Pleura: No pneumothorax. No pleural effusion. Status post right upper lobectomy. There is an 8 mm solid right middle lobe pulmonary nodule (series 8/image 39), increased from 5 mm on 04/26/2018 chest CT. The previously described separate subsolid 6 mm right middle lobe pulmonary nodule has resolved. No acute consolidative airspace disease, lung masses or new significant pulmonary nodules. Scattered parenchymal bands in the lingula and left lower lobe are stable and compatible with postinfectious/postinflammatory scarring. Upper abdomen: No acute abnormality. Musculoskeletal: No aggressive appearing focal osseous lesions. Mild thoracic spondylosis. IMPRESSION: 1. One of the two previously visualized right middle lobe pulmonary nodules has demonstrated mild interval growth, now 8 mm and solid density. Pulmonary metastasis cannot be excluded. Continued close chest CT surveillance recommended at a minimum. 2. The other previously visualized right middle lobe subsolid pulmonary nodule has resolved. 3. No thoracic adenopathy or other potential findings of metastatic disease in the chest. Aortic Atherosclerosis (ICD10-I70.0). Electronically Signed   By: Ilona Sorrel M.D.   On: 08/12/2018 11:32   Ct Chest Wo Contrast  Result  Date: 04/26/2018 CLINICAL DATA:  Right upper lobe lung adenocarcinoma status post right upper lobectomy 04/20/2017. Restaging. EXAM: CT CHEST WITHOUT CONTRAST TECHNIQUE: Multidetector CT imaging of the chest was performed following the standard protocol without IV contrast. COMPARISON:  10/22/2017 chest CT. FINDINGS: Cardiovascular: Normal heart size. No significant pericardial effusion/thickening. Left anterior descending coronary atherosclerosis. Atherosclerotic nonaneurysmal thoracic aorta.  Normal caliber pulmonary arteries. Mediastinum/Nodes: No discrete thyroid nodules. Unremarkable esophagus. No pathologically enlarged axillary, mediastinal or hilar lymph nodes, noting limited sensitivity for the detection of hilar adenopathy on this noncontrast study. Lungs/Pleura: No pneumothorax. No pleural effusion. Status post right upper lobectomy. No acute consolidative airspace disease or lung masses. Small parenchymal bands in the lingula and anterior left lower lobe, compatible with mild scarring or atelectasis. Two new subsolid right middle lobe pulmonary nodules, largest 6 mm (series 8/image 29). No additional significant pulmonary nodules. Upper abdomen: No acute abnormality. Musculoskeletal: No aggressive appearing focal osseous lesions. Mild thoracic spondylosis. IMPRESSION: 1. Two new subsolid right middle lobe pulmonary nodules, largest 6 mm, indeterminate. Recommend attention on follow-up chest CT in 3 months. 2. No thoracic adenopathy or other potential findings of metastatic disease in the chest. Aortic Atherosclerosis (ICD10-I70.0). Electronically Signed   By: Ilona Sorrel M.D.   On: 04/26/2018 13:02   I have independently reviewed the above radiology studies  and reviewed the findings with the patient.     Ct Chest Wo Contrast  Result Date: 10/22/2017 CLINICAL DATA:  Non-small cell lung cancer. Right upper lobe lesion removed April 20, 2017. EXAM: CT CHEST WITHOUT CONTRAST TECHNIQUE: Multidetector CT imaging of the chest was performed following the standard protocol without IV contrast. COMPARISON:  Chest CT 02/18/2017 and PET-CT 03/18/2017 FINDINGS: Cardiovascular: The heart is normal in size. No pericardial effusion. There is tortuosity, ectasia and calcification of the thoracic aorta. Coronary artery calcifications are noted. Mediastinum/Nodes: No mediastinal or hilar mass or lymphadenopathy. The esophagus is grossly normal. Lungs/Pleura: Surgical changes from a right upper lobe lung  resection. No findings for residual or recurrent tumor. No acute pulmonary process. No worrisome pulmonary lesions. No pleural effusion. Upper Abdomen: No significant upper abdominal findings. Musculoskeletal: No breast masses, supraclavicular or axillary lymphadenopathy. No significant bony findings. No lytic or sclerotic bone lesions to suggest metastatic disease. IMPRESSION: 1. Status post resection of a right upper lobe pulmonary lesion without findings for residual or recurrent tumor. 2. No mediastinal or hilar mass or lymphadenopathy. 3. No worrisome pulmonary lesions or acute pulmonary process. Aortic Atherosclerosis (ICD10-I70.0). Electronically Signed   By: Marijo Sanes M.D.   On: 10/22/2017 12:32    I have independently reviewed the above radiology studies  and reviewed the findings with the patient.   Recent Lab Findings: Lab Results  Component Value Date   WBC 8.5 04/29/2017   HGB 7.4 (L) 04/29/2017   HCT 22.3 (L) 04/29/2017   PLT 293 04/29/2017   GLUCOSE 101 (H) 05/01/2017   CHOL 168 02/18/2018   TRIG 50 02/18/2018   HDL 90 02/18/2018   LDLDIRECT 137 (H) 11/13/2017   LDLCALC 68 02/18/2018   ALT 21 02/18/2018   AST 27 04/22/2017   NA 135 05/01/2017   K 4.4 05/01/2017   CL 101 05/01/2017   CREATININE 0.73 05/01/2017   BUN 9 05/01/2017   CO2 25 05/01/2017   INR 0.96 04/16/2017      Assessment / Plan:  1/ Two new subsolid right middle lobe pulmonary nodules, noted on scan in July 2019, one nodule has completely  disappeared and the other one significantly decreased in size to 4 mm compared to the scans July 2019 and October 2019.  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

## 2018-11-15 DIAGNOSIS — M79675 Pain in left toe(s): Secondary | ICD-10-CM | POA: Diagnosis not present

## 2018-11-15 DIAGNOSIS — L851 Acquired keratosis [keratoderma] palmaris et plantaris: Secondary | ICD-10-CM | POA: Diagnosis not present

## 2018-11-15 DIAGNOSIS — M2042 Other hammer toe(s) (acquired), left foot: Secondary | ICD-10-CM | POA: Diagnosis not present

## 2018-11-16 ENCOUNTER — Encounter: Payer: Self-pay | Admitting: Cardiology

## 2018-11-16 ENCOUNTER — Ambulatory Visit: Payer: Medicare HMO | Admitting: Cardiology

## 2018-11-16 ENCOUNTER — Encounter: Payer: Self-pay | Admitting: *Deleted

## 2018-11-16 VITALS — BP 111/70 | HR 69 | Ht 59.0 in | Wt 133.4 lb

## 2018-11-16 DIAGNOSIS — E782 Mixed hyperlipidemia: Secondary | ICD-10-CM

## 2018-11-16 DIAGNOSIS — I10 Essential (primary) hypertension: Secondary | ICD-10-CM | POA: Diagnosis not present

## 2018-11-16 DIAGNOSIS — I2510 Atherosclerotic heart disease of native coronary artery without angina pectoris: Secondary | ICD-10-CM | POA: Diagnosis not present

## 2018-11-16 NOTE — Progress Notes (Signed)
Clinical Summary Dawn Lin is a 78 y.o.female last seen by Dr End, she is establishing in our office which is closer to home, this is our first visit together.    1. CAD - noted on prior CT scsan - 07/2017 nuclear stress: no clear ischemia.  07/2017 echo LVEF 57-84%, grade I diastolic dysfunction  - no recent chest pain. No SOB or DOE, does report dome deconditioning.    2. HTN - compliant with meds   3. Hyperlipidemia - recently started on crestor 02/2018 TC 168 TG 50 HDL 90 LDL 68  4. Lung adenocarcinoma - s/p resection.   5. SOB - 03/2017 PFTs: minimal perfusion defect - some recent symptoms she attributes to deconditioning   SH: Sister is [place] who is also coming to establish in our clinic  Past Medical History:  Diagnosis Date  . Anxiety   . Dyspnea    with exertion  . 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 Family history of adverse reaction to anesthesia    Children - N/V  . History of blood transfusion   . Hyperlipidemia   . Other osteoporosis   . Other thalassemia (HCC)      Allergies  Allergen Reactions  . Lisinopril Cough     Current Outpatient Medications  Medication Sig Dispense Refill  . acetaminophen (TYLENOL) 325 MG tablet Take 2 tablets (650 mg total) by mouth every 6 (six) hours as needed for mild pain.    Marland Kitchen amLODipine (NORVASC) 10 MG tablet Take 10 mg by mouth daily.    Marland Kitchen aspirin EC 81 MG tablet Take 81 mg by mouth daily.    . Calcium-Phosphorus-Vitamin D [id number] MG-MG-UNIT CHEW Chew 1 each by mouth 2 (two) times daily.     . hydrochlorothiazide (HYDRODIURIL) 25 MG tablet Take 1 tablet (25 mg total) by mouth daily. 93 tablet 4  . Multiple Vitamin (MULTIVITAMIN WITH MINERALS) TABS tablet Take 1 tablet by mouth daily.    . rosuvastatin (CRESTOR) 5 MG tablet Take 1 tablet (5 mg total) by mouth daily. 90 tablet 3   No current facility-administered medications for this visit.      Past Surgical History:    Procedure Laterality Date  . COLONOSCOPY    . INGUINAL HERNIA REPAIR Left   . LUMBAR DISC SURGERY  1990's   for severe pain and sciatica  . PARTIAL HYSTERECTOMY  1970's   for bleeding and prolapse  . VIDEO ASSISTED THORACOSCOPY (VATS)/ LOBECTOMY Right 04/20/2017   Procedure: VIDEO ASSISTED THORACOSCOPY (VATS)/RIGHT UPPER LOBECTOMY;  Surgeon: Grace Isaac, MD;  Location: [facility];  Service: Thoracic;  Laterality: Right;  Marland Kitchen VIDEO BRONCHOSCOPY N/A 04/20/2017   Procedure: VIDEO BRONCHOSCOPY;  Surgeon: Grace Isaac, MD;  Location: [facility] OR;  Service: Thoracic;  Laterality: N/A;     Allergies  Allergen Reactions  . Lisinopril Cough      Family History  Problem Relation Age of Onset  . Hypertension Mother   . Heart attack Sister 14     Social History Ms. Sobh reports that she has never smoked. She has never used smokeless tobacco. Ms. Betterton reports no history of alcohol use.   Review of Systems CONSTITUTIONAL: No weight loss, fever, chills, weakness or fatigue.  HEENT: Eyes: No visual loss, blurred vision, double vision or yellow sclerae.No hearing loss, sneezing, congestion, runny nose or sore throat.  SKIN: No rash or itching.  CARDIOVASCULAR: per hpi RESPIRATORY: per hpi GASTROINTESTINAL: No anorexia, nausea, vomiting  or diarrhea. No abdominal pain or blood.  GENITOURINARY: No burning on urination, no polyuria NEUROLOGICAL: No headache, dizziness, syncope, paralysis, ataxia, numbness or tingling in the extremities. No change in bowel or bladder control.  MUSCULOSKELETAL: No muscle, back pain, joint pain or stiffness.  LYMPHATICS: No enlarged nodes. No history of splenectomy.  PSYCHIATRIC: No history of depression or anxiety.  ENDOCRINOLOGIC: No reports of sweating, cold or heat intolerance. No polyuria or polydipsia.  Marland Kitchen   Physical Examination Vitals:   11/16/18 0848  BP: 111/70  Pulse: 69  SpO2: 99%   Vitals:   11/16/18 0848  Weight: 133 lb 6.4 oz (60.5  kg)  Height: 4\' 11"  (1.499 m)    Gen: resting comfortably, no acute distress HEENT: no scleral icterus, pupils equal round and reactive, no palptable cervical adenopathy,  CV: RRR, 2/6 systolic murmur rusb, no jvd Resp: Clear to auscultation bilaterally GI: abdomen is soft, non-tender, non-distended, normal bowel sounds, no hepatosplenomegaly MSK: extremities are warm, no edema.  Skin: warm, no rash Neuro:  no focal deficits Psych: appropriate affect   Diagnostic Studies  07/2018 nuclear stress  Nuclear stress EF: 80%.  There was no ST segment deviation noted during stress.  There is a large defect of moderate severity present in the basal anteroseptal, basal inferoseptal, mid anteroseptal, mid inferoseptal, apical septal and apex location. The defect is non-reversible. In the setting of normal LVF, this is likely due to breast attenuation artifact. No ischemia noted.  This is a low risk study.  The left ventricular ejection fraction is hyperdynamic (>65%).   07/2017 echo Study Conclusions  - Left ventricle: The cavity size was normal. Wall thickness was   normal. Systolic function was normal. The estimated ejection   fraction was in the range of 60% to 65%. Wall motion was normal;   there were no regional wall motion abnormalities. Doppler   parameters are consistent with abnormal left ventricular   relaxation (grade 1 diastolic dysfunction). - Aortic valve: There was trivial regurgitation.  Assessment and Plan  1. CAD - noted by CT scan, nuclear stress test shows no significnat ischemia - continue medical therapy, no significant symptoms  2. Hyperlipidemia - at goal, continue statin  3. Heart murmur - not commneted on in echo but mean grad 16, AVA VTI 1.54 would suggest mild AS. - continue to monitor.   4. HTN - at goal, continue current meds  F/u 1 year   Arnoldo Lenis, M.D.

## 2018-11-16 NOTE — Patient Instructions (Signed)

## 2018-11-23 ENCOUNTER — Other Ambulatory Visit: Payer: Self-pay | Admitting: Internal Medicine

## 2018-11-23 NOTE — Telephone Encounter (Signed)
Please review for refill. Thanks!  

## 2018-11-30 DIAGNOSIS — B078 Other viral warts: Secondary | ICD-10-CM | POA: Diagnosis not present

## 2018-11-30 DIAGNOSIS — Z23 Encounter for immunization: Secondary | ICD-10-CM | POA: Diagnosis not present

## 2018-11-30 DIAGNOSIS — L570 Actinic keratosis: Secondary | ICD-10-CM | POA: Diagnosis not present

## 2018-11-30 DIAGNOSIS — L603 Nail dystrophy: Secondary | ICD-10-CM | POA: Diagnosis not present

## 2018-12-08 DIAGNOSIS — M159 Polyosteoarthritis, unspecified: Secondary | ICD-10-CM | POA: Diagnosis not present

## 2018-12-08 DIAGNOSIS — I10 Essential (primary) hypertension: Secondary | ICD-10-CM | POA: Diagnosis not present

## 2019-01-10 DIAGNOSIS — M159 Polyosteoarthritis, unspecified: Secondary | ICD-10-CM | POA: Diagnosis not present

## 2019-01-10 DIAGNOSIS — I10 Essential (primary) hypertension: Secondary | ICD-10-CM | POA: Diagnosis not present

## 2019-02-07 DIAGNOSIS — M159 Polyosteoarthritis, unspecified: Secondary | ICD-10-CM | POA: Diagnosis not present

## 2019-02-07 DIAGNOSIS — I10 Essential (primary) hypertension: Secondary | ICD-10-CM | POA: Diagnosis not present

## 2019-02-14 DIAGNOSIS — M2042 Other hammer toe(s) (acquired), left foot: Secondary | ICD-10-CM | POA: Diagnosis not present

## 2019-02-14 DIAGNOSIS — L851 Acquired keratosis [keratoderma] palmaris et plantaris: Secondary | ICD-10-CM | POA: Diagnosis not present

## 2019-02-14 DIAGNOSIS — M79675 Pain in left toe(s): Secondary | ICD-10-CM | POA: Diagnosis not present

## 2019-02-16 ENCOUNTER — Other Ambulatory Visit: Payer: Self-pay | Admitting: Cardiothoracic Surgery

## 2019-02-16 DIAGNOSIS — C3411 Malignant neoplasm of upper lobe, right bronchus or lung: Secondary | ICD-10-CM

## 2019-03-08 DIAGNOSIS — M159 Polyosteoarthritis, unspecified: Secondary | ICD-10-CM | POA: Diagnosis not present

## 2019-03-08 DIAGNOSIS — I10 Essential (primary) hypertension: Secondary | ICD-10-CM | POA: Diagnosis not present

## 2019-04-05 DIAGNOSIS — Z961 Presence of intraocular lens: Secondary | ICD-10-CM | POA: Diagnosis not present

## 2019-04-05 DIAGNOSIS — H04123 Dry eye syndrome of bilateral lacrimal glands: Secondary | ICD-10-CM | POA: Diagnosis not present

## 2019-04-05 DIAGNOSIS — M159 Polyosteoarthritis, unspecified: Secondary | ICD-10-CM | POA: Diagnosis not present

## 2019-04-05 DIAGNOSIS — I10 Essential (primary) hypertension: Secondary | ICD-10-CM | POA: Diagnosis not present

## 2019-04-06 DIAGNOSIS — M542 Cervicalgia: Secondary | ICD-10-CM | POA: Diagnosis not present

## 2019-04-06 DIAGNOSIS — M9903 Segmental and somatic dysfunction of lumbar region: Secondary | ICD-10-CM | POA: Diagnosis not present

## 2019-04-06 DIAGNOSIS — M546 Pain in thoracic spine: Secondary | ICD-10-CM | POA: Diagnosis not present

## 2019-04-06 DIAGNOSIS — M9901 Segmental and somatic dysfunction of cervical region: Secondary | ICD-10-CM | POA: Diagnosis not present

## 2019-04-06 DIAGNOSIS — M9902 Segmental and somatic dysfunction of thoracic region: Secondary | ICD-10-CM | POA: Diagnosis not present

## 2019-04-06 DIAGNOSIS — M9905 Segmental and somatic dysfunction of pelvic region: Secondary | ICD-10-CM | POA: Diagnosis not present

## 2019-04-06 DIAGNOSIS — M545 Low back pain: Secondary | ICD-10-CM | POA: Diagnosis not present

## 2019-04-20 DIAGNOSIS — M542 Cervicalgia: Secondary | ICD-10-CM | POA: Diagnosis not present

## 2019-04-20 DIAGNOSIS — M9903 Segmental and somatic dysfunction of lumbar region: Secondary | ICD-10-CM | POA: Diagnosis not present

## 2019-04-20 DIAGNOSIS — M545 Low back pain: Secondary | ICD-10-CM | POA: Diagnosis not present

## 2019-04-20 DIAGNOSIS — M9901 Segmental and somatic dysfunction of cervical region: Secondary | ICD-10-CM | POA: Diagnosis not present

## 2019-04-20 DIAGNOSIS — M9902 Segmental and somatic dysfunction of thoracic region: Secondary | ICD-10-CM | POA: Diagnosis not present

## 2019-04-20 DIAGNOSIS — M546 Pain in thoracic spine: Secondary | ICD-10-CM | POA: Diagnosis not present

## 2019-04-20 DIAGNOSIS — M9905 Segmental and somatic dysfunction of pelvic region: Secondary | ICD-10-CM | POA: Diagnosis not present

## 2019-04-22 DIAGNOSIS — I10 Essential (primary) hypertension: Secondary | ICD-10-CM | POA: Diagnosis not present

## 2019-04-22 DIAGNOSIS — Z299 Encounter for prophylactic measures, unspecified: Secondary | ICD-10-CM | POA: Diagnosis not present

## 2019-04-22 DIAGNOSIS — Z85118 Personal history of other malignant neoplasm of bronchus and lung: Secondary | ICD-10-CM | POA: Diagnosis not present

## 2019-04-22 DIAGNOSIS — R05 Cough: Secondary | ICD-10-CM | POA: Diagnosis not present

## 2019-04-22 DIAGNOSIS — R69 Illness, unspecified: Secondary | ICD-10-CM | POA: Diagnosis not present

## 2019-04-22 DIAGNOSIS — Z6826 Body mass index (BMI) 26.0-26.9, adult: Secondary | ICD-10-CM | POA: Diagnosis not present

## 2019-04-22 DIAGNOSIS — Z789 Other specified health status: Secondary | ICD-10-CM | POA: Diagnosis not present

## 2019-04-25 ENCOUNTER — Other Ambulatory Visit: Payer: Medicare HMO

## 2019-04-27 ENCOUNTER — Other Ambulatory Visit: Payer: Self-pay

## 2019-04-28 ENCOUNTER — Ambulatory Visit
Admission: RE | Admit: 2019-04-28 | Discharge: 2019-04-28 | Disposition: A | Discharge status: Home / Self Care | Payer: Medicare HMO | Source: Ambulatory Visit | Attending: Cardiothoracic Surgery | Admitting: Cardiothoracic Surgery

## 2019-04-28 ENCOUNTER — Ambulatory Visit: Payer: Medicare HMO | Admitting: Cardiothoracic Surgery

## 2019-04-28 VITALS — BP 150/60 | HR 70 | Temp 97.6°F | Resp 20 | Ht 59.0 in | Wt 130.0 lb

## 2019-04-28 DIAGNOSIS — Z902 Acquired absence of lung [part of]: Secondary | ICD-10-CM | POA: Diagnosis not present

## 2019-04-28 DIAGNOSIS — J984 Other disorders of lung: Secondary | ICD-10-CM | POA: Diagnosis not present

## 2019-04-28 DIAGNOSIS — J9811 Atelectasis: Secondary | ICD-10-CM | POA: Diagnosis not present

## 2019-04-28 DIAGNOSIS — R911 Solitary pulmonary nodule: Secondary | ICD-10-CM | POA: Diagnosis not present

## 2019-04-28 DIAGNOSIS — Z85118 Personal history of other malignant neoplasm of bronchus and lung: Secondary | ICD-10-CM | POA: Diagnosis not present

## 2019-04-28 DIAGNOSIS — C3411 Malignant neoplasm of upper lobe, right bronchus or lung: Secondary | ICD-10-CM

## 2019-04-28 NOTE — Progress Notes (Signed)
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 SURGICAL PROCEDURE:  Video bronchoscopy, right video-assisted thoracoscopy, right upper lobectomy with lymph node dissection, and placement of On-Q.  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 Patient is asymptomatic, respiratory status is good, she denies cough or hemoptysis.      Past Medical History:  Diagnosis Date  . Anxiety   . Dyspnea    with exertion  . 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 Family history of adverse reaction to anesthesia    Children - N/V  . History of blood transfusion   . Hyperlipidemia   . Other osteoporosis   . Other thalassemia ([place])      Social History   Tobacco Use  Smoking Status Never Smoker  Smokeless Tobacco Never Used    Social History   Substance and Sexual Activity  Alcohol Use No     Allergies  Allergen Reactions  . Lisinopril Cough    Current Outpatient Medications  Medication Sig Dispense Refill  . acetaminophen (TYLENOL) 325 MG tablet Take 2 tablets (650 mg total) by mouth every 6 (six) hours as needed for mild pain.    Marland Kitchen amLODipine (NORVASC) 10 MG tablet Take 10 mg by mouth daily.    Marland Kitchen aspirin EC 81  MG tablet Take 81 mg by mouth daily.    . Calcium-Phosphorus-Vitamin D [id number] MG-MG-UNIT CHEW Chew 1 each by mouth 2 (two) times daily.     . hydrochlorothiazide (HYDRODIURIL) 25 MG tablet Take 1 tablet (25 mg total) by mouth daily. 93 tablet 4  . Multiple Vitamin (MULTIVITAMIN WITH MINERALS) TABS tablet Take 1 tablet by mouth daily.    . rosuvastatin (CRESTOR) 5 MG tablet TAKE 1 TABLET BY MOUTH EVERY DAY 90 tablet 3   No current facility-administered medications for this visit.        Physical Exam: BP (!) 150/60   Pulse 70   Temp 97.6 F (36.4 C) (Skin)   Resp 20   Ht 4\' 11"  (1.499 m)   Wt 130 lb (59 kg)   LMP  (LMP Unknown)   SpO2 96% Comment: RA  BMI 26.26 kg/m  General appearance: alert, cooperative, appears stated age and no distress Head: Normocephalic, without obvious abnormality, atraumatic Neck: no adenopathy, no carotid bruit, no JVD, supple, symmetrical, trachea midline and thyroid not enlarged, symmetric, no tenderness/mass/nodules Lymph nodes: Cervical, supraclavicular, and axillary nodes normal. Resp: clear to auscultation bilaterally Cardio: regular rate and rhythm, S1, S2 normal, no murmur, click, rub or gallop GI: soft, non-tender; bowel sounds normal; no masses,  no organomegaly Extremities: extremities normal, atraumatic, no cyanosis or edema Neurologic: Grossly normal  Diagnostic Studies & Laboratory data:     Recent Radiology Findings:  Ct Chest Wo Contrast  Result Date: 04/28/2019 CLINICAL DATA:  Small cell lung cancer. Evaluate response to therapy EXAM: CT CHEST WITHOUT CONTRAST TECHNIQUE: Multidetector CT imaging of the chest was performed following the standard protocol without IV contrast. COMPARISON:  CT 11/11/2018 FINDINGS: Cardiovascular: Coronary artery calcification and aortic atherosclerotic calcification. Mediastinum/Nodes: No axillary or supraclavicular adenopathy. No mediastinal hilar adenopathy. No pericardial effusion. Esophagus  normal. Lungs/Pleura: Postsurgical change in the RIGHT lung consistent with upper lobectomy. No nodularity within the RIGHT lung suggests lung cancer recurrence. Mild linear scarring in RIGHT middle lobe at site of resolved nodule (image 34/8). LEFT lung is mildly hyperexpanded without concerning nodularity. There is band of atelectasis in the inferior lingula and anterior LEFT lower lobe unchanged. Mild bronchial thickening in the lower lobes on LEFT and RIGHT. Upper Abdomen: Limited view of the liver, kidneys, pancreas are unremarkable. Normal adrenal glands. Musculoskeletal: No aggressive osseous lesion. IMPRESSION: 1. No evidence of lung cancer recurrence post RIGHT upper lobectomy. 2. No mediastinal lymphadenopathy. 3. Stable benign atelectasis scarring in the lungs. Electronically Signed   By: Suzy Bouchard M.D.   On: 04/28/2019 10:44  I have independently reviewed the above radiology studies  and reviewed the findings with the patient.    Ct Super D Chest Wo Contrast  Result Date: 11/11/2018 CLINICAL DATA:  Right lung cancer. EXAM: CT CHEST WITHOUT CONTRAST TECHNIQUE: Multidetector CT imaging of the chest was performed using thin slice collimation for electromagnetic bronchoscopy planning purposes, without intravenous contrast. COMPARISON:  08/12/2018 FINDINGS: Cardiovascular: The heart size is normal. No substantial pericardial effusion. Coronary artery calcification is evident. Atherosclerotic calcification is noted in the wall of the thoracic aorta. Mediastinum/Nodes: No mediastinal lymphadenopathy. No evidence for gross hilar lymphadenopathy although assessment is limited by the lack of intravenous contrast on today's study. The esophagus has normal imaging features. There is no axillary lymphadenopathy. Lungs/Pleura: The central tracheobronchial airways are patent. Surgical changes again noted in the right lung. 8 mm right middle lobe nodule seen on the previous study has almost completely  resolved in the interval with only a subtle 4 mm ground-glass opacity at this location today. No new suspicious nodule or mass. Chronic atelectasis or scarring in the lingula and left lower lobe, similar to prior. No pleural effusion. Upper Abdomen: Unremarkable. Musculoskeletal: No worrisome lytic or sclerotic osseous abnormality. IMPRESSION: 1. 8 mm right middle lobe nodule seen on the previous study has almost completely resolved in the interval with only a subtle 4 mm ground-glass opacity at this location today. 2. No new suspicious nodule or mass. 3.  Aortic Atherosclerois (ICD10-170.0) Electronically Signed   By: Misty Stanley M.D.   On: 11/11/2018 12:41      Ct Chest Wo Contrast  Result Date: 08/12/2018 CLINICAL DATA:  Right upper lobe lung adenocarcinoma status post right upper lobectomy 04/20/2017. Restaging. EXAM: CT CHEST WITHOUT CONTRAST TECHNIQUE: Multidetector CT imaging of the chest was performed following the standard protocol without IV contrast. COMPARISON:  04/26/2018 chest CT. FINDINGS: Cardiovascular: Normal heart size. No significant pericardial effusion/thickening. Left anterior descending coronary atherosclerosis. Atherosclerotic nonaneurysmal thoracic aorta. Normal caliber pulmonary arteries. Mediastinum/Nodes: No discrete thyroid nodules. Unremarkable esophagus. No pathologically enlarged axillary, mediastinal or hilar lymph nodes, noting limited sensitivity for the detection of hilar adenopathy on this noncontrast study. Lungs/Pleura: No pneumothorax. No pleural effusion. Status post right upper lobectomy. There is an 8 mm solid right middle lobe pulmonary nodule (series  8/image 39), increased from 5 mm on 04/26/2018 chest CT. The previously described separate subsolid 6 mm right middle lobe pulmonary nodule has resolved. No acute consolidative airspace disease, lung masses or new significant pulmonary nodules. Scattered parenchymal bands in the lingula and left lower lobe are  stable and compatible with postinfectious/postinflammatory scarring. Upper abdomen: No acute abnormality. Musculoskeletal: No aggressive appearing focal osseous lesions. Mild thoracic spondylosis. IMPRESSION: 1. One of the two previously visualized right middle lobe pulmonary nodules has demonstrated mild interval growth, now 8 mm and solid density. Pulmonary metastasis cannot be excluded. Continued close chest CT surveillance recommended at a minimum. 2. The other previously visualized right middle lobe subsolid pulmonary nodule has resolved. 3. No thoracic adenopathy or other potential findings of metastatic disease in the chest. Aortic Atherosclerosis (ICD10-I70.0). Electronically Signed   By: Ilona Sorrel M.D.   On: 08/12/2018 11:32   Ct Chest Wo Contrast  Result Date: 04/26/2018 CLINICAL DATA:  Right upper lobe lung adenocarcinoma status post right upper lobectomy 04/20/2017. Restaging. EXAM: CT CHEST WITHOUT CONTRAST TECHNIQUE: Multidetector CT imaging of the chest was performed following the standard protocol without IV contrast. COMPARISON:  10/22/2017 chest CT. FINDINGS: Cardiovascular: Normal heart size. No significant pericardial effusion/thickening. Left anterior descending coronary atherosclerosis. Atherosclerotic nonaneurysmal thoracic aorta. Normal caliber pulmonary arteries. Mediastinum/Nodes: No discrete thyroid nodules. Unremarkable esophagus. No pathologically enlarged axillary, mediastinal or hilar lymph nodes, noting limited sensitivity for the detection of hilar adenopathy on this noncontrast study. Lungs/Pleura: No pneumothorax. No pleural effusion. Status post right upper lobectomy. No acute consolidative airspace disease or lung masses. Small parenchymal bands in the lingula and anterior left lower lobe, compatible with mild scarring or atelectasis. Two new subsolid right middle lobe pulmonary nodules, largest 6 mm (series 8/image 29). No additional significant pulmonary nodules. Upper  abdomen: No acute abnormality. Musculoskeletal: No aggressive appearing focal osseous lesions. Mild thoracic spondylosis. IMPRESSION: 1. Two new subsolid right middle lobe pulmonary nodules, largest 6 mm, indeterminate. Recommend attention on follow-up chest CT in 3 months. 2. No thoracic adenopathy or other potential findings of metastatic disease in the chest. Aortic Atherosclerosis (ICD10-I70.0). Electronically Signed   By: Ilona Sorrel M.D.   On: 04/26/2018 13:02   I have independently reviewed the above radiology studies  and reviewed the findings with the patient.     Ct Chest Wo Contrast  Result Date: 10/22/2017 CLINICAL DATA:  Non-small cell lung cancer. Right upper lobe lesion removed April 20, 2017. EXAM: CT CHEST WITHOUT CONTRAST TECHNIQUE: Multidetector CT imaging of the chest was performed following the standard protocol without IV contrast. COMPARISON:  Chest CT 02/18/2017 and PET-CT 03/18/2017 FINDINGS: Cardiovascular: The heart is normal in size. No pericardial effusion. There is tortuosity, ectasia and calcification of the thoracic aorta. Coronary artery calcifications are noted. Mediastinum/Nodes: No mediastinal or hilar mass or lymphadenopathy. The esophagus is grossly normal. Lungs/Pleura: Surgical changes from a right upper lobe lung resection. No findings for residual or recurrent tumor. No acute pulmonary process. No worrisome pulmonary lesions. No pleural effusion. Upper Abdomen: No significant upper abdominal findings. Musculoskeletal: No breast masses, supraclavicular or axillary lymphadenopathy. No significant bony findings. No lytic or sclerotic bone lesions to suggest metastatic disease. IMPRESSION: 1. Status post resection of a right upper lobe pulmonary lesion without findings for residual or recurrent tumor. 2. No mediastinal or hilar mass or lymphadenopathy. 3. No worrisome pulmonary lesions or acute pulmonary process. Aortic Atherosclerosis (ICD10-I70.0). Electronically Signed    By: Marijo Sanes M.D.   On:  10/22/2017 12:32    I have independently reviewed the above radiology studies  and reviewed the findings with the patient.   Recent Lab Findings: Lab Results  Component Value Date   WBC 8.5 04/29/2017   HGB 7.4 (L) 04/29/2017   HCT 22.3 (L) 04/29/2017   PLT 293 04/29/2017   GLUCOSE 101 (H) 05/01/2017   CHOL 168 02/18/2018   TRIG 50 02/18/2018   HDL 90 02/18/2018   LDLDIRECT 137 (H) 11/13/2017   LDLCALC 68 02/18/2018   ALT 21 02/18/2018   AST 27 04/22/2017   NA 135 05/01/2017   K 4.4 05/01/2017   CL 101 05/01/2017   CREATININE 0.73 05/01/2017   BUN 9 05/01/2017   CO2 25 05/01/2017   INR 0.96 04/16/2017      Assessment / Plan:    Patient has status post right upper lobectomy for stage I a non-small cell carcinoma of the lung-follow-up CT scan now approximately 2 years postop shows no evidence of recurrent disease.  We will continue to monitor with a follow-up CT scan of chest in 1 year  Grace Isaac MD      [address] [place] [address] Office [phone]   Beeper [phone]  04/28/2019 12:02 PM

## 2019-05-11 DIAGNOSIS — M159 Polyosteoarthritis, unspecified: Secondary | ICD-10-CM | POA: Diagnosis not present

## 2019-05-11 DIAGNOSIS — I10 Essential (primary) hypertension: Secondary | ICD-10-CM | POA: Diagnosis not present

## 2019-06-05 DIAGNOSIS — R69 Illness, unspecified: Secondary | ICD-10-CM | POA: Diagnosis not present

## 2019-06-08 DIAGNOSIS — I10 Essential (primary) hypertension: Secondary | ICD-10-CM | POA: Diagnosis not present

## 2019-06-08 DIAGNOSIS — M159 Polyosteoarthritis, unspecified: Secondary | ICD-10-CM | POA: Diagnosis not present

## 2019-06-15 DIAGNOSIS — R69 Illness, unspecified: Secondary | ICD-10-CM | POA: Diagnosis not present

## 2019-06-17 DIAGNOSIS — M9901 Segmental and somatic dysfunction of cervical region: Secondary | ICD-10-CM | POA: Diagnosis not present

## 2019-06-17 DIAGNOSIS — M545 Low back pain: Secondary | ICD-10-CM | POA: Diagnosis not present

## 2019-06-17 DIAGNOSIS — M9905 Segmental and somatic dysfunction of pelvic region: Secondary | ICD-10-CM | POA: Diagnosis not present

## 2019-06-17 DIAGNOSIS — M9902 Segmental and somatic dysfunction of thoracic region: Secondary | ICD-10-CM | POA: Diagnosis not present

## 2019-06-17 DIAGNOSIS — M546 Pain in thoracic spine: Secondary | ICD-10-CM | POA: Diagnosis not present

## 2019-06-17 DIAGNOSIS — M9903 Segmental and somatic dysfunction of lumbar region: Secondary | ICD-10-CM | POA: Diagnosis not present

## 2019-06-17 DIAGNOSIS — M542 Cervicalgia: Secondary | ICD-10-CM | POA: Diagnosis not present

## 2019-06-22 DIAGNOSIS — M9903 Segmental and somatic dysfunction of lumbar region: Secondary | ICD-10-CM | POA: Diagnosis not present

## 2019-06-22 DIAGNOSIS — M9901 Segmental and somatic dysfunction of cervical region: Secondary | ICD-10-CM | POA: Diagnosis not present

## 2019-06-22 DIAGNOSIS — M9905 Segmental and somatic dysfunction of pelvic region: Secondary | ICD-10-CM | POA: Diagnosis not present

## 2019-06-22 DIAGNOSIS — M542 Cervicalgia: Secondary | ICD-10-CM | POA: Diagnosis not present

## 2019-06-22 DIAGNOSIS — M546 Pain in thoracic spine: Secondary | ICD-10-CM | POA: Diagnosis not present

## 2019-06-22 DIAGNOSIS — M9902 Segmental and somatic dysfunction of thoracic region: Secondary | ICD-10-CM | POA: Diagnosis not present

## 2019-06-22 DIAGNOSIS — M545 Low back pain: Secondary | ICD-10-CM | POA: Diagnosis not present

## 2019-06-29 DIAGNOSIS — M542 Cervicalgia: Secondary | ICD-10-CM | POA: Diagnosis not present

## 2019-06-29 DIAGNOSIS — M545 Low back pain: Secondary | ICD-10-CM | POA: Diagnosis not present

## 2019-06-29 DIAGNOSIS — M9903 Segmental and somatic dysfunction of lumbar region: Secondary | ICD-10-CM | POA: Diagnosis not present

## 2019-06-29 DIAGNOSIS — M9902 Segmental and somatic dysfunction of thoracic region: Secondary | ICD-10-CM | POA: Diagnosis not present

## 2019-06-29 DIAGNOSIS — M9901 Segmental and somatic dysfunction of cervical region: Secondary | ICD-10-CM | POA: Diagnosis not present

## 2019-06-29 DIAGNOSIS — M9905 Segmental and somatic dysfunction of pelvic region: Secondary | ICD-10-CM | POA: Diagnosis not present

## 2019-06-29 DIAGNOSIS — M546 Pain in thoracic spine: Secondary | ICD-10-CM | POA: Diagnosis not present

## 2019-07-06 DIAGNOSIS — M9901 Segmental and somatic dysfunction of cervical region: Secondary | ICD-10-CM | POA: Diagnosis not present

## 2019-07-06 DIAGNOSIS — M9905 Segmental and somatic dysfunction of pelvic region: Secondary | ICD-10-CM | POA: Diagnosis not present

## 2019-07-06 DIAGNOSIS — M545 Low back pain: Secondary | ICD-10-CM | POA: Diagnosis not present

## 2019-07-06 DIAGNOSIS — M542 Cervicalgia: Secondary | ICD-10-CM | POA: Diagnosis not present

## 2019-07-06 DIAGNOSIS — M9903 Segmental and somatic dysfunction of lumbar region: Secondary | ICD-10-CM | POA: Diagnosis not present

## 2019-07-06 DIAGNOSIS — M546 Pain in thoracic spine: Secondary | ICD-10-CM | POA: Diagnosis not present

## 2019-07-06 DIAGNOSIS — M9902 Segmental and somatic dysfunction of thoracic region: Secondary | ICD-10-CM | POA: Diagnosis not present

## 2019-07-14 DIAGNOSIS — Z1339 Encounter for screening examination for other mental health and behavioral disorders: Secondary | ICD-10-CM | POA: Diagnosis not present

## 2019-07-14 DIAGNOSIS — Z1331 Encounter for screening for depression: Secondary | ICD-10-CM | POA: Diagnosis not present

## 2019-07-14 DIAGNOSIS — Z0000 Encounter for general adult medical examination without abnormal findings: Secondary | ICD-10-CM | POA: Diagnosis not present

## 2019-07-14 DIAGNOSIS — Z299 Encounter for prophylactic measures, unspecified: Secondary | ICD-10-CM | POA: Diagnosis not present

## 2019-07-14 DIAGNOSIS — I10 Essential (primary) hypertension: Secondary | ICD-10-CM | POA: Diagnosis not present

## 2019-07-14 DIAGNOSIS — Z7189 Other specified counseling: Secondary | ICD-10-CM | POA: Diagnosis not present

## 2019-07-14 DIAGNOSIS — R69 Illness, unspecified: Secondary | ICD-10-CM | POA: Diagnosis not present

## 2019-07-14 DIAGNOSIS — Z6825 Body mass index (BMI) 25.0-25.9, adult: Secondary | ICD-10-CM | POA: Diagnosis not present

## 2019-07-14 DIAGNOSIS — Z79899 Other long term (current) drug therapy: Secondary | ICD-10-CM | POA: Diagnosis not present

## 2019-07-14 DIAGNOSIS — D649 Anemia, unspecified: Secondary | ICD-10-CM | POA: Diagnosis not present

## 2019-07-14 DIAGNOSIS — E785 Hyperlipidemia, unspecified: Secondary | ICD-10-CM | POA: Diagnosis not present

## 2019-07-15 DIAGNOSIS — M9902 Segmental and somatic dysfunction of thoracic region: Secondary | ICD-10-CM | POA: Diagnosis not present

## 2019-07-15 DIAGNOSIS — M159 Polyosteoarthritis, unspecified: Secondary | ICD-10-CM | POA: Diagnosis not present

## 2019-07-15 DIAGNOSIS — M546 Pain in thoracic spine: Secondary | ICD-10-CM | POA: Diagnosis not present

## 2019-07-15 DIAGNOSIS — M9903 Segmental and somatic dysfunction of lumbar region: Secondary | ICD-10-CM | POA: Diagnosis not present

## 2019-07-15 DIAGNOSIS — I10 Essential (primary) hypertension: Secondary | ICD-10-CM | POA: Diagnosis not present

## 2019-07-15 DIAGNOSIS — M9901 Segmental and somatic dysfunction of cervical region: Secondary | ICD-10-CM | POA: Diagnosis not present

## 2019-07-15 DIAGNOSIS — M9905 Segmental and somatic dysfunction of pelvic region: Secondary | ICD-10-CM | POA: Diagnosis not present

## 2019-07-15 DIAGNOSIS — M542 Cervicalgia: Secondary | ICD-10-CM | POA: Diagnosis not present

## 2019-07-15 DIAGNOSIS — M545 Low back pain: Secondary | ICD-10-CM | POA: Diagnosis not present

## 2019-07-22 DIAGNOSIS — M545 Low back pain: Secondary | ICD-10-CM | POA: Diagnosis not present

## 2019-07-22 DIAGNOSIS — M542 Cervicalgia: Secondary | ICD-10-CM | POA: Diagnosis not present

## 2019-07-22 DIAGNOSIS — M9905 Segmental and somatic dysfunction of pelvic region: Secondary | ICD-10-CM | POA: Diagnosis not present

## 2019-07-22 DIAGNOSIS — M9901 Segmental and somatic dysfunction of cervical region: Secondary | ICD-10-CM | POA: Diagnosis not present

## 2019-07-22 DIAGNOSIS — M9903 Segmental and somatic dysfunction of lumbar region: Secondary | ICD-10-CM | POA: Diagnosis not present

## 2019-07-22 DIAGNOSIS — M546 Pain in thoracic spine: Secondary | ICD-10-CM | POA: Diagnosis not present

## 2019-07-22 DIAGNOSIS — M9902 Segmental and somatic dysfunction of thoracic region: Secondary | ICD-10-CM | POA: Diagnosis not present

## 2019-07-26 DIAGNOSIS — R69 Illness, unspecified: Secondary | ICD-10-CM | POA: Diagnosis not present

## 2019-07-29 DIAGNOSIS — M546 Pain in thoracic spine: Secondary | ICD-10-CM | POA: Diagnosis not present

## 2019-07-29 DIAGNOSIS — M9905 Segmental and somatic dysfunction of pelvic region: Secondary | ICD-10-CM | POA: Diagnosis not present

## 2019-07-29 DIAGNOSIS — M545 Low back pain: Secondary | ICD-10-CM | POA: Diagnosis not present

## 2019-07-29 DIAGNOSIS — M9903 Segmental and somatic dysfunction of lumbar region: Secondary | ICD-10-CM | POA: Diagnosis not present

## 2019-07-29 DIAGNOSIS — M542 Cervicalgia: Secondary | ICD-10-CM | POA: Diagnosis not present

## 2019-07-29 DIAGNOSIS — M9902 Segmental and somatic dysfunction of thoracic region: Secondary | ICD-10-CM | POA: Diagnosis not present

## 2019-07-29 DIAGNOSIS — M9901 Segmental and somatic dysfunction of cervical region: Secondary | ICD-10-CM | POA: Diagnosis not present

## 2019-08-05 DIAGNOSIS — M9903 Segmental and somatic dysfunction of lumbar region: Secondary | ICD-10-CM | POA: Diagnosis not present

## 2019-08-05 DIAGNOSIS — M9901 Segmental and somatic dysfunction of cervical region: Secondary | ICD-10-CM | POA: Diagnosis not present

## 2019-08-05 DIAGNOSIS — M545 Low back pain: Secondary | ICD-10-CM | POA: Diagnosis not present

## 2019-08-05 DIAGNOSIS — M546 Pain in thoracic spine: Secondary | ICD-10-CM | POA: Diagnosis not present

## 2019-08-05 DIAGNOSIS — M542 Cervicalgia: Secondary | ICD-10-CM | POA: Diagnosis not present

## 2019-08-05 DIAGNOSIS — M9905 Segmental and somatic dysfunction of pelvic region: Secondary | ICD-10-CM | POA: Diagnosis not present

## 2019-08-05 DIAGNOSIS — M9902 Segmental and somatic dysfunction of thoracic region: Secondary | ICD-10-CM | POA: Diagnosis not present

## 2019-08-31 DIAGNOSIS — M9903 Segmental and somatic dysfunction of lumbar region: Secondary | ICD-10-CM | POA: Diagnosis not present

## 2019-08-31 DIAGNOSIS — M545 Low back pain: Secondary | ICD-10-CM | POA: Diagnosis not present

## 2019-08-31 DIAGNOSIS — M546 Pain in thoracic spine: Secondary | ICD-10-CM | POA: Diagnosis not present

## 2019-08-31 DIAGNOSIS — M9901 Segmental and somatic dysfunction of cervical region: Secondary | ICD-10-CM | POA: Diagnosis not present

## 2019-08-31 DIAGNOSIS — M9905 Segmental and somatic dysfunction of pelvic region: Secondary | ICD-10-CM | POA: Diagnosis not present

## 2019-08-31 DIAGNOSIS — M542 Cervicalgia: Secondary | ICD-10-CM | POA: Diagnosis not present

## 2019-08-31 DIAGNOSIS — M9902 Segmental and somatic dysfunction of thoracic region: Secondary | ICD-10-CM | POA: Diagnosis not present

## 2019-09-07 DIAGNOSIS — M159 Polyosteoarthritis, unspecified: Secondary | ICD-10-CM | POA: Diagnosis not present

## 2019-09-07 DIAGNOSIS — I10 Essential (primary) hypertension: Secondary | ICD-10-CM | POA: Diagnosis not present

## 2019-10-04 DIAGNOSIS — M159 Polyosteoarthritis, unspecified: Secondary | ICD-10-CM | POA: Diagnosis not present

## 2019-10-04 DIAGNOSIS — I10 Essential (primary) hypertension: Secondary | ICD-10-CM | POA: Diagnosis not present

## 2019-10-17 ENCOUNTER — Other Ambulatory Visit (HOSPITAL_COMMUNITY): Payer: Self-pay | Admitting: Nurse Practitioner

## 2019-10-17 DIAGNOSIS — Z1231 Encounter for screening mammogram for malignant neoplasm of breast: Secondary | ICD-10-CM

## 2019-10-21 ENCOUNTER — Ambulatory Visit (HOSPITAL_COMMUNITY): Payer: Medicare HMO

## 2019-10-26 ENCOUNTER — Other Ambulatory Visit: Payer: Self-pay

## 2019-10-26 ENCOUNTER — Ambulatory Visit (HOSPITAL_COMMUNITY)
Admission: RE | Admit: 2019-10-26 | Discharge: 2019-10-26 | Disposition: A | Discharge status: Home / Self Care | Payer: Medicare HMO | Source: Ambulatory Visit | Attending: Nurse Practitioner | Admitting: Nurse Practitioner

## 2019-10-26 DIAGNOSIS — Z1231 Encounter for screening mammogram for malignant neoplasm of breast: Secondary | ICD-10-CM | POA: Insufficient documentation

## 2019-10-27 DIAGNOSIS — I700 Atherosclerosis of aorta: Secondary | ICD-10-CM | POA: Diagnosis not present

## 2019-10-27 DIAGNOSIS — Z789 Other specified health status: Secondary | ICD-10-CM | POA: Diagnosis not present

## 2019-10-27 DIAGNOSIS — M5030 Other cervical disc degeneration, unspecified cervical region: Secondary | ICD-10-CM | POA: Diagnosis not present

## 2019-10-27 DIAGNOSIS — M50323 Other cervical disc degeneration at C6-C7 level: Secondary | ICD-10-CM | POA: Diagnosis not present

## 2019-10-27 DIAGNOSIS — C3491 Malignant neoplasm of unspecified part of right bronchus or lung: Secondary | ICD-10-CM | POA: Diagnosis not present

## 2019-10-27 DIAGNOSIS — M542 Cervicalgia: Secondary | ICD-10-CM | POA: Diagnosis not present

## 2019-10-27 DIAGNOSIS — Z299 Encounter for prophylactic measures, unspecified: Secondary | ICD-10-CM | POA: Diagnosis not present

## 2019-10-27 DIAGNOSIS — I10 Essential (primary) hypertension: Secondary | ICD-10-CM | POA: Diagnosis not present

## 2019-10-27 DIAGNOSIS — Z6825 Body mass index (BMI) 25.0-25.9, adult: Secondary | ICD-10-CM | POA: Diagnosis not present

## 2019-10-27 DIAGNOSIS — M5382 Other specified dorsopathies, cervical region: Secondary | ICD-10-CM | POA: Diagnosis not present

## 2019-11-02 DIAGNOSIS — Z299 Encounter for prophylactic measures, unspecified: Secondary | ICD-10-CM | POA: Diagnosis not present

## 2019-11-02 DIAGNOSIS — Z6824 Body mass index (BMI) 24.0-24.9, adult: Secondary | ICD-10-CM | POA: Diagnosis not present

## 2019-11-02 DIAGNOSIS — I10 Essential (primary) hypertension: Secondary | ICD-10-CM | POA: Diagnosis not present

## 2019-11-02 DIAGNOSIS — I700 Atherosclerosis of aorta: Secondary | ICD-10-CM | POA: Diagnosis not present

## 2019-11-02 DIAGNOSIS — M5030 Other cervical disc degeneration, unspecified cervical region: Secondary | ICD-10-CM | POA: Diagnosis not present

## 2019-11-02 DIAGNOSIS — C3491 Malignant neoplasm of unspecified part of right bronchus or lung: Secondary | ICD-10-CM | POA: Diagnosis not present

## 2019-11-02 DIAGNOSIS — I2510 Atherosclerotic heart disease of native coronary artery without angina pectoris: Secondary | ICD-10-CM | POA: Diagnosis not present

## 2019-11-15 ENCOUNTER — Other Ambulatory Visit: Payer: Self-pay | Admitting: *Deleted

## 2019-11-15 MED ORDER — ROSUVASTATIN CALCIUM 5 MG PO TABS: 5.0000 mg | ORAL_TABLET | Freq: Every day | ORAL | 3 refills | Status: DC

## 2019-11-17 DIAGNOSIS — R42 Dizziness and giddiness: Secondary | ICD-10-CM | POA: Diagnosis not present

## 2019-11-17 DIAGNOSIS — R0602 Shortness of breath: Secondary | ICD-10-CM | POA: Diagnosis not present

## 2019-11-17 DIAGNOSIS — I700 Atherosclerosis of aorta: Secondary | ICD-10-CM | POA: Diagnosis not present

## 2019-11-17 DIAGNOSIS — Z79899 Other long term (current) drug therapy: Secondary | ICD-10-CM | POA: Diagnosis not present

## 2019-11-17 DIAGNOSIS — R270 Ataxia, unspecified: Secondary | ICD-10-CM | POA: Diagnosis not present

## 2019-11-17 DIAGNOSIS — Z7982 Long term (current) use of aspirin: Secondary | ICD-10-CM | POA: Diagnosis not present

## 2019-11-17 DIAGNOSIS — I10 Essential (primary) hypertension: Secondary | ICD-10-CM | POA: Diagnosis not present

## 2019-11-22 DIAGNOSIS — R42 Dizziness and giddiness: Secondary | ICD-10-CM | POA: Diagnosis not present

## 2019-11-22 DIAGNOSIS — Z299 Encounter for prophylactic measures, unspecified: Secondary | ICD-10-CM | POA: Diagnosis not present

## 2019-11-22 DIAGNOSIS — C3491 Malignant neoplasm of unspecified part of right bronchus or lung: Secondary | ICD-10-CM | POA: Diagnosis not present

## 2019-11-22 DIAGNOSIS — Z6825 Body mass index (BMI) 25.0-25.9, adult: Secondary | ICD-10-CM | POA: Diagnosis not present

## 2019-11-22 DIAGNOSIS — I10 Essential (primary) hypertension: Secondary | ICD-10-CM | POA: Diagnosis not present

## 2019-11-22 DIAGNOSIS — I700 Atherosclerosis of aorta: Secondary | ICD-10-CM | POA: Diagnosis not present

## 2019-11-22 DIAGNOSIS — R69 Illness, unspecified: Secondary | ICD-10-CM | POA: Diagnosis not present

## 2019-11-27 ENCOUNTER — Other Ambulatory Visit: Payer: Self-pay

## 2019-11-27 ENCOUNTER — Ambulatory Visit: Payer: Medicare HMO | Attending: Internal Medicine

## 2019-11-27 DIAGNOSIS — Z23 Encounter for immunization: Secondary | ICD-10-CM | POA: Insufficient documentation

## 2019-11-27 NOTE — Progress Notes (Signed)
   Covid-19 Vaccination Clinic  Name:  Dawn Lin    MRN: [id number] DOB: 05-Dec-1940  11/27/2019  [name] was observed post Covid-19 immunization for 15 minutes without incidence. She was provided with Vaccine Information Sheet and instruction to access the V-Safe system.   [name] was instructed to call 911 with any severe reactions post vaccine: Marland Kitchen Difficulty breathing  . Swelling of your face and throat  . A fast heartbeat  . A bad rash all over your body  . Dizziness and weakness    Immunizations Administered    Name Date Dose VIS Date Route   Moderna COVID-19 Vaccine 11/27/2019  3:15 PM 0.5 mL 09/13/2019 Intramuscular   Manufacturer: Moderna   Lot: [id number]   [place]: [id number]

## 2019-11-28 DIAGNOSIS — H04123 Dry eye syndrome of bilateral lacrimal glands: Secondary | ICD-10-CM | POA: Diagnosis not present

## 2019-11-28 DIAGNOSIS — Z961 Presence of intraocular lens: Secondary | ICD-10-CM | POA: Diagnosis not present

## 2019-11-28 DIAGNOSIS — H43393 Other vitreous opacities, bilateral: Secondary | ICD-10-CM | POA: Diagnosis not present

## 2019-12-01 ENCOUNTER — Telehealth: Payer: Self-pay | Admitting: Cardiology

## 2019-12-01 NOTE — Telephone Encounter (Signed)
Virtual Visit Pre-Appointment Phone Call  "(Name), I am calling you today to discuss your upcoming appointment. We are currently trying to limit exposure to the virus that causes COVID-19 by seeing patients at home rather than in the office."  "What is the BEST phone number to call the day of the visit?" -   [phone] 1. "Do you have or have access to (through a family member/friend) a smartphone with video capability that we can use for your visit?" a. If yes - list this number in appt notes as "cell" (if different from BEST phone #) and list the appointment type as a VIDEO visit in appointment notes b. If no - list the appointment type as a PHONE visit in appointment notes  2. Confirm consent - "In the setting of the current Covid19 crisis, you are scheduled for a (phone or video) visit with your provider on (date) at (time).  Just as we do with many in-office visits, in order for you to participate in this visit, we must obtain consent.  If you'd like, I can send this to your mychart (if signed up) or email for you to review.  Otherwise, I can obtain your verbal consent now.  All virtual visits are billed to your insurance company just like a normal visit would be.  By agreeing to a virtual visit, we'd like you to understand that the technology does not allow for your provider to perform an examination, and thus may limit your provider's ability to fully assess your condition. If your provider identifies any concerns that need to be evaluated in person, we will make arrangements to do so.  Finally, though the technology is pretty good, we cannot assure that it will always work on either your or our end, and in the setting of a video visit, we may have to convert it to a phone-only visit.  In either situation, we cannot ensure that we have a secure connection.  Are you willing to proceed?" STAFF: Did the patient verbally acknowledge consent to telehealth visit? Document YES/NO here:  yes  3. Advise patient to be prepared - "Two hours prior to your appointment, go ahead and check your blood pressure, pulse, oxygen saturation, and your weight (if you have the equipment to check those) and write them all down. When your visit starts, your provider will ask you for this information. If you have an Apple Watch or Kardia device, please plan to have heart rate information ready on the day of your appointment. Please have a pen and paper handy nearby the day of the visit as well."  4. Give patient instructions for MyChart download to smartphone OR Doximity/Doxy.me as below if video visit (depending on what platform provider is using)  5. Inform patient they will receive a phone call 15 minutes prior to their appointment time (may be from unknown caller ID) so they should be prepared to answer    TELEPHONE CALL NOTE  Dawn Lin has been deemed a candidate for a follow-up tele-health visit to limit community exposure during the Covid-19 pandemic. I spoke with the patient via phone to ensure availability of phone/video source, confirm preferred email & phone number, and discuss instructions and expectations.  I reminded Dawn Lin to be prepared with any vital sign and/or heart rhythm information that could potentially be obtained via home monitoring, at the time of her visit. I reminded Dawn Lin to expect a phone call prior to her visit.  Vicky  T Slaughter 12/01/2019 1:31 PM   INSTRUCTIONS FOR DOWNLOADING THE MYCHART APP TO SMARTPHONE  - The patient must first make sure to have activated MyChart and know their login information - If Apple, go to [employer] and type in MyChart in the search bar and download the app. If Android, ask patient to go to [employer] and type in [place] in the search bar and download the app. The app is free but as with any other app downloads, their phone may require them to verify saved payment  information or Apple/Android password.  - The patient will need to then log into the app with their MyChart username and password, and select [facility] as their healthcare provider to link the account. When it is time for your visit, go to the MyChart app, find appointments, and click Begin Video Visit. Be sure to Select Allow for your device to access the Microphone and Camera for your visit. You will then be connected, and your provider will be with you shortly.  **If they have any issues connecting, or need assistance please contact MyChart service desk [phone])**  **If using a computer, in order to ensure the best quality for their visit they will need to use either of the following Internet Browsers: [employer], or Google Chrome**  IF USING DOXIMITY or DOXY.ME - The patient will receive a link just prior to their visit by text.     FULL LENGTH CONSENT FOR TELE-HEALTH VISIT   I hereby voluntarily request, consent and authorize [facility] and its employed or contracted physicians, physician assistants, nurse practitioners or other licensed health care professionals (the Practitioner), to provide me with telemedicine health care services (the "Services") as deemed necessary by the treating Practitioner. I acknowledge and consent to receive the Services by the Practitioner via telemedicine. I understand that the telemedicine visit will involve communicating with the Practitioner through live audiovisual communication technology and the disclosure of certain medical information by electronic transmission. I acknowledge that I have been given the opportunity to request an in-person assessment or other available alternative prior to the telemedicine visit and am voluntarily participating in the telemedicine visit.  I understand that I have the right to withhold or withdraw my consent to the use of telemedicine in the course of my care at any time, without affecting my right  to future care or treatment, and that the Practitioner or I may terminate the telemedicine visit at any time. I understand that I have the right to inspect all information obtained and/or recorded in the course of the telemedicine visit and may receive copies of available information for a reasonable fee.  I understand that some of the potential risks of receiving the Services via telemedicine include:  Marland Kitchen Delay or interruption in medical evaluation due to technological equipment failure or disruption; . Information transmitted may not be sufficient (e.g. poor resolution of images) to allow for appropriate medical decision making by the Practitioner; and/or  . In rare instances, security protocols could fail, causing a breach of personal health information.  Furthermore, I acknowledge that it is my responsibility to provide information about my medical history, conditions and care that is complete and accurate to the best of my ability. I acknowledge that Practitioner's advice, recommendations, and/or decision may be based on factors not within their control, such as incomplete or inaccurate data provided by me or distortions of diagnostic images or specimens that may result from electronic transmissions. I understand that the practice  of medicine is not an Chief Strategy Officer and that Practitioner makes no warranties or guarantees regarding treatment outcomes. I acknowledge that I will receive a copy of this consent concurrently upon execution via email to the email address I last provided but may also request a printed copy by calling the office of [facility].    I understand that my insurance will be billed for this visit.   I have read or had this consent read to me. . I understand the contents of this consent, which adequately explains the benefits and risks of the Services being provided via telemedicine.  . I have been provided ample opportunity to ask questions regarding this consent and the Services  and have had my questions answered to my satisfaction. . I give my informed consent for the services to be provided through the use of telemedicine in my medical care  By participating in this telemedicine visit I agree to the above.

## 2019-12-02 ENCOUNTER — Ambulatory Visit: Payer: Medicare HMO | Admitting: Cardiology

## 2019-12-05 ENCOUNTER — Telehealth (INDEPENDENT_AMBULATORY_CARE_PROVIDER_SITE_OTHER): Payer: Medicare HMO | Admitting: Cardiology

## 2019-12-05 ENCOUNTER — Encounter: Payer: Self-pay | Admitting: *Deleted

## 2019-12-05 ENCOUNTER — Encounter: Payer: Self-pay | Admitting: Cardiology

## 2019-12-05 VITALS — BP 131/60 | HR 64 | Ht 59.0 in | Wt 129.0 lb

## 2019-12-05 DIAGNOSIS — E782 Mixed hyperlipidemia: Secondary | ICD-10-CM

## 2019-12-05 DIAGNOSIS — I10 Essential (primary) hypertension: Secondary | ICD-10-CM | POA: Diagnosis not present

## 2019-12-05 DIAGNOSIS — I350 Nonrheumatic aortic (valve) stenosis: Secondary | ICD-10-CM

## 2019-12-05 DIAGNOSIS — H359 Unspecified retinal disorder: Secondary | ICD-10-CM

## 2019-12-05 DIAGNOSIS — C3490 Malignant neoplasm of unspecified part of unspecified bronchus or lung: Secondary | ICD-10-CM

## 2019-12-05 DIAGNOSIS — I2510 Atherosclerotic heart disease of native coronary artery without angina pectoris: Secondary | ICD-10-CM | POA: Diagnosis not present

## 2019-12-05 DIAGNOSIS — E785 Hyperlipidemia, unspecified: Secondary | ICD-10-CM | POA: Diagnosis not present

## 2019-12-05 NOTE — Patient Instructions (Addendum)
Medication Instructions:   Your physician recommends that you continue on your current medications as directed. Please refer to the Current Medication list given to you today.  Labwork:  NONE  Testing/Procedures:  NONE  Follow-Up:  Your physician recommends that you schedule a follow-up appointment in: 1 year (office or virtual). You will receive a reminder letter in the mail in about 10 months reminding you to call and schedule your appointment. If you don't receive this letter, please contact our office.  Any Other Special Instructions Will Be Listed Below (If Applicable).  If you need a refill on your cardiac medications before your next appointment, please call your pharmacy.

## 2019-12-05 NOTE — Progress Notes (Signed)
Virtual Visit via Telephone Note   This visit type was conducted due to national recommendations for restrictions regarding the COVID-19 Pandemic (e.g. social distancing) in an effort to limit this patient's exposure and mitigate transmission in our community.  Due to her co-morbid illnesses, this patient is at least at moderate risk for complications without adequate follow up.  This format is felt to be most appropriate for this patient at this time.  The patient did not have access to video technology/had technical difficulties with video requiring transitioning to audio format only (telephone).  All issues noted in this document were discussed and addressed.  No physical exam could be performed with this format.  Please refer to the patient's chart for her  consent to telehealth for [facility].   Date:  12/05/2019   ID:  Dawn Lin, DOB 04-10-1941, MRN [id number]  Patient Location: Home Provider Location: Office  PCP:  [name], MD  Cardiologist:  Carlyle Dolly, MD  Electrophysiologist:  None   Evaluation Performed:  Follow-Up Visit  Chief Complaint:  Follow up  History of Present Illness:    Dawn Lin is a 79 y.o. female seen today for follow up of the following medical problems.    1. CAD - noted on prior CT scsan - 07/2017 nuclear stress: no clear ischemia.  07/2017 echo LVEF 03-49%, grade I diastolic dysfunction   - no recent chest pain   2. HTN - she is compliant with meds   3. Hyperlipidemia - she is on on crestor 02/2018 TC 168 TG 50 HDL 90 LDL 68 - labs followed by pcp  4. Lung adenocarcinoma - s/p resection.   5. SOB - 03/2017 PFTs: minimal perfusion defect - some recent symptoms she attributes to deconditioning  6. Mild aortic stenosis 07/2017 echo  mean grad 16, AVA VTI 1.54 would suggest mild AS.    7. Vision change - episode of vision change, lightheadedness while driving - seen at [facility] ER.  - extensive workup in ER was unremarkable. Diagnosed with vertigo   SH: got covid vaccine 11/27/19     SH: Sister is [place] who is also coming to establish in our clinic The patient does not have symptoms concerning for COVID-19 infection (fever, chills, cough, or new shortness of breath).    Past Medical History:  Diagnosis Date  . Anxiety   . Dyspnea    with exertion  . 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 Family history of adverse reaction to anesthesia    Children - N/V  . History of blood transfusion   . Hyperlipidemia   . Other osteoporosis   . Other thalassemia [facility])    Past Surgical History:  Procedure Laterality Date  . COLONOSCOPY    . INGUINAL HERNIA REPAIR Left   . LUMBAR DISC SURGERY  1990's   for severe pain and sciatica  . PARTIAL HYSTERECTOMY  1970's   for bleeding and prolapse  . VIDEO ASSISTED THORACOSCOPY (VATS)/ LOBECTOMY Right 04/20/2017   Procedure: VIDEO ASSISTED THORACOSCOPY (VATS)/RIGHT UPPER LOBECTOMY;  Surgeon: Grace Isaac, MD;  Location: [facility];  Service: Thoracic;  Laterality: Right;  Marland Kitchen VIDEO BRONCHOSCOPY N/A 04/20/2017   Procedure: VIDEO BRONCHOSCOPY;  Surgeon: Grace Isaac, MD;  Location: [facility];  Service: Thoracic;  Laterality: N/A;     No outpatient medications have been marked as taking for the 12/05/19 encounter (Appointment) with Arnoldo Lenis, MD.     Allergies:  Lisinopril   Social History   Tobacco Use  . Smoking status: Never Smoker  . Smokeless tobacco: Never Used  Substance Use Topics  . Alcohol use: No  . Drug use: No     Family Hx: The patient's family history includes Heart attack (age of onset: 33) in her sister; Hypertension in her mother.  ROS:   Please see the history of present illness.     All other systems reviewed and are negative.   Prior CV studies:   The following studies were reviewed today:  07/2018 nuclear stress  Nuclear stress EF:  80%.  There was no ST segment deviation noted during stress.  There is a large defect of moderate severity present in the basal anteroseptal, basal inferoseptal, mid anteroseptal, mid inferoseptal, apical septal and apex location. The defect is non-reversible. In the setting of normal LVF, this is likely due to breast attenuation artifact. No ischemia noted.  This is a low risk study.  The left ventricular ejection fraction is hyperdynamic (>65%).   07/2017 echo Study Conclusions  - Left ventricle: The cavity size was normal. Wall thickness was normal. Systolic function was normal. The estimated ejection fraction was in the range of 60% to 65%. Wall motion was normal; there were no regional wall motion abnormalities. Doppler parameters are consistent with abnormal left ventricular relaxation (grade 1 diastolic dysfunction). - Aortic valve: There was trivial regurgitation.  Labs/Other Tests and Data Reviewed:    EKG:  No ECG reviewed.  Recent Labs: No results found for requested labs within last 8760 hours.   Recent Lipid Panel Lab Results  Component Value Date/Time   CHOL 168 02/18/2018 09:03 AM   TRIG 50 02/18/2018 09:03 AM   HDL 90 02/18/2018 09:03 AM   CHOLHDL 1.9 02/18/2018 09:03 AM   CHOLHDL 3.2 06/10/2011 10:50 AM   LDLCALC 68 02/18/2018 09:03 AM   LDLDIRECT 137 (H) 11/13/2017 03:17 PM    Wt Readings from Last 3 Encounters:  04/28/19 130 lb (59 kg)  11/16/18 133 lb 6.4 oz (60.5 kg)  11/11/18 130 lb (59 kg)     Objective:    Vital Signs:   Today's Vitals   12/05/19 0819  BP: 131/60  Pulse: 64  Weight: 129 lb (58.5 kg)  Height: 4\' 11"  (1.499 m)   Body mass index is 26.05 kg/m. Normal affect. Normal speech pattern and tone. Comfortable, no apparent distress. No audible signs of SOB or wheezing.   ASSESSMENT & PLAN:    1. CAD - noted by CT scan, nuclear stress test shows no significnat ischemia - no symptoms, continue to monitor  2.  Hyperlipidemia - request pcp labs  3. Mild aortic stenosis - would repeat US next year  4. HTN - she is at goal, continue current meds  COVID-19 Education: The signs and symptoms of COVID-19 were discussed with the patient and how to seek care for testing (follow up with PCP or arrange E-visit).  The importance of social distancing was discussed today.  Time:   Today, I have spent 22 minutes with the patient with telehealth technology discussing the above problems.     Medication Adjustments/Labs and Tests Ordered: Current medicines are reviewed at length with the patient today.  Concerns regarding medicines are outlined above.   Tests Ordered: No orders of the defined types were placed in this encounter.   Medication Changes: No orders of the defined types were placed in this encounter.   Follow Up:  Either In Person  or Virtual in 1 year(s)  Signed, Carlyle Dolly, MD  12/05/2019 8:14 AM    [facility]

## 2019-12-06 DIAGNOSIS — G9009 Other idiopathic peripheral autonomic neuropathy: Secondary | ICD-10-CM | POA: Diagnosis not present

## 2019-12-06 DIAGNOSIS — H81393 Other peripheral vertigo, bilateral: Secondary | ICD-10-CM | POA: Diagnosis not present

## 2019-12-06 DIAGNOSIS — I7389 Other specified peripheral vascular diseases: Secondary | ICD-10-CM | POA: Diagnosis not present

## 2019-12-20 DIAGNOSIS — C3491 Malignant neoplasm of unspecified part of right bronchus or lung: Secondary | ICD-10-CM | POA: Diagnosis not present

## 2019-12-20 DIAGNOSIS — R69 Illness, unspecified: Secondary | ICD-10-CM | POA: Diagnosis not present

## 2019-12-20 DIAGNOSIS — Z6825 Body mass index (BMI) 25.0-25.9, adult: Secondary | ICD-10-CM | POA: Diagnosis not present

## 2019-12-20 DIAGNOSIS — Z299 Encounter for prophylactic measures, unspecified: Secondary | ICD-10-CM | POA: Diagnosis not present

## 2019-12-20 DIAGNOSIS — R42 Dizziness and giddiness: Secondary | ICD-10-CM | POA: Diagnosis not present

## 2019-12-20 DIAGNOSIS — I10 Essential (primary) hypertension: Secondary | ICD-10-CM | POA: Diagnosis not present

## 2019-12-20 DIAGNOSIS — I700 Atherosclerosis of aorta: Secondary | ICD-10-CM | POA: Diagnosis not present

## 2019-12-25 ENCOUNTER — Ambulatory Visit: Payer: Self-pay | Attending: Internal Medicine

## 2019-12-25 DIAGNOSIS — Z23 Encounter for immunization: Secondary | ICD-10-CM

## 2019-12-25 NOTE — Progress Notes (Signed)
   Covid-19 Vaccination Clinic  Name:  [name]    MRN: [id number] DOB: [birth year]  12/25/2019  [name] was observed post Covid-19 immunization for 15 minutes without incident. She was provided with Vaccine Information Sheet and instruction to access the V-Safe system.   [name] was instructed to call 911 with any severe reactions post vaccine: Marland Kitchen Difficulty breathing  . Swelling of face and throat  . A fast heartbeat  . A bad rash all over body  . Dizziness and weakness   Immunizations Administered    Name Date Dose VIS Date Route   Moderna COVID-19 Vaccine 12/25/2019  3:03 PM 0.5 mL 09/13/2019 Intramuscular   Manufacturer: Moderna   Lot: [id number]   [place]: [id number]

## 2020-01-11 DIAGNOSIS — I10 Essential (primary) hypertension: Secondary | ICD-10-CM | POA: Diagnosis not present

## 2020-01-11 DIAGNOSIS — M159 Polyosteoarthritis, unspecified: Secondary | ICD-10-CM | POA: Diagnosis not present

## 2020-01-26 DIAGNOSIS — I10 Essential (primary) hypertension: Secondary | ICD-10-CM | POA: Diagnosis not present

## 2020-01-26 DIAGNOSIS — Z299 Encounter for prophylactic measures, unspecified: Secondary | ICD-10-CM | POA: Diagnosis not present

## 2020-01-26 DIAGNOSIS — C3491 Malignant neoplasm of unspecified part of right bronchus or lung: Secondary | ICD-10-CM | POA: Diagnosis not present

## 2020-01-26 DIAGNOSIS — Z6825 Body mass index (BMI) 25.0-25.9, adult: Secondary | ICD-10-CM | POA: Diagnosis not present

## 2020-01-26 DIAGNOSIS — I700 Atherosclerosis of aorta: Secondary | ICD-10-CM | POA: Diagnosis not present

## 2020-02-14 DIAGNOSIS — R69 Illness, unspecified: Secondary | ICD-10-CM | POA: Diagnosis not present

## 2020-03-11 DIAGNOSIS — I10 Essential (primary) hypertension: Secondary | ICD-10-CM | POA: Diagnosis not present

## 2020-03-11 DIAGNOSIS — M159 Polyosteoarthritis, unspecified: Secondary | ICD-10-CM | POA: Diagnosis not present

## 2020-03-26 ENCOUNTER — Other Ambulatory Visit: Payer: Self-pay | Admitting: Cardiothoracic Surgery

## 2020-03-26 DIAGNOSIS — M9902 Segmental and somatic dysfunction of thoracic region: Secondary | ICD-10-CM | POA: Diagnosis not present

## 2020-03-26 DIAGNOSIS — M542 Cervicalgia: Secondary | ICD-10-CM | POA: Diagnosis not present

## 2020-03-26 DIAGNOSIS — M9901 Segmental and somatic dysfunction of cervical region: Secondary | ICD-10-CM | POA: Diagnosis not present

## 2020-03-26 DIAGNOSIS — Z902 Acquired absence of lung [part of]: Secondary | ICD-10-CM

## 2020-03-26 DIAGNOSIS — M545 Low back pain: Secondary | ICD-10-CM | POA: Diagnosis not present

## 2020-03-26 DIAGNOSIS — M9905 Segmental and somatic dysfunction of pelvic region: Secondary | ICD-10-CM | POA: Diagnosis not present

## 2020-03-26 DIAGNOSIS — M546 Pain in thoracic spine: Secondary | ICD-10-CM | POA: Diagnosis not present

## 2020-03-26 DIAGNOSIS — M9903 Segmental and somatic dysfunction of lumbar region: Secondary | ICD-10-CM | POA: Diagnosis not present

## 2020-04-04 DIAGNOSIS — M9902 Segmental and somatic dysfunction of thoracic region: Secondary | ICD-10-CM | POA: Diagnosis not present

## 2020-04-04 DIAGNOSIS — M9905 Segmental and somatic dysfunction of pelvic region: Secondary | ICD-10-CM | POA: Diagnosis not present

## 2020-04-04 DIAGNOSIS — M546 Pain in thoracic spine: Secondary | ICD-10-CM | POA: Diagnosis not present

## 2020-04-04 DIAGNOSIS — M9903 Segmental and somatic dysfunction of lumbar region: Secondary | ICD-10-CM | POA: Diagnosis not present

## 2020-04-04 DIAGNOSIS — M9901 Segmental and somatic dysfunction of cervical region: Secondary | ICD-10-CM | POA: Diagnosis not present

## 2020-04-04 DIAGNOSIS — M545 Low back pain: Secondary | ICD-10-CM | POA: Diagnosis not present

## 2020-04-04 DIAGNOSIS — M542 Cervicalgia: Secondary | ICD-10-CM | POA: Diagnosis not present

## 2020-04-11 DIAGNOSIS — M9902 Segmental and somatic dysfunction of thoracic region: Secondary | ICD-10-CM | POA: Diagnosis not present

## 2020-04-11 DIAGNOSIS — M9901 Segmental and somatic dysfunction of cervical region: Secondary | ICD-10-CM | POA: Diagnosis not present

## 2020-04-11 DIAGNOSIS — M9903 Segmental and somatic dysfunction of lumbar region: Secondary | ICD-10-CM | POA: Diagnosis not present

## 2020-04-11 DIAGNOSIS — M9905 Segmental and somatic dysfunction of pelvic region: Secondary | ICD-10-CM | POA: Diagnosis not present

## 2020-04-11 DIAGNOSIS — I10 Essential (primary) hypertension: Secondary | ICD-10-CM | POA: Diagnosis not present

## 2020-04-11 DIAGNOSIS — M545 Low back pain: Secondary | ICD-10-CM | POA: Diagnosis not present

## 2020-04-11 DIAGNOSIS — M159 Polyosteoarthritis, unspecified: Secondary | ICD-10-CM | POA: Diagnosis not present

## 2020-04-11 DIAGNOSIS — M542 Cervicalgia: Secondary | ICD-10-CM | POA: Diagnosis not present

## 2020-04-11 DIAGNOSIS — M546 Pain in thoracic spine: Secondary | ICD-10-CM | POA: Diagnosis not present

## 2020-04-18 DIAGNOSIS — M9902 Segmental and somatic dysfunction of thoracic region: Secondary | ICD-10-CM | POA: Diagnosis not present

## 2020-04-18 DIAGNOSIS — M546 Pain in thoracic spine: Secondary | ICD-10-CM | POA: Diagnosis not present

## 2020-04-18 DIAGNOSIS — M542 Cervicalgia: Secondary | ICD-10-CM | POA: Diagnosis not present

## 2020-04-18 DIAGNOSIS — M545 Low back pain: Secondary | ICD-10-CM | POA: Diagnosis not present

## 2020-04-18 DIAGNOSIS — M9901 Segmental and somatic dysfunction of cervical region: Secondary | ICD-10-CM | POA: Diagnosis not present

## 2020-04-18 DIAGNOSIS — M9903 Segmental and somatic dysfunction of lumbar region: Secondary | ICD-10-CM | POA: Diagnosis not present

## 2020-04-18 DIAGNOSIS — M9905 Segmental and somatic dysfunction of pelvic region: Secondary | ICD-10-CM | POA: Diagnosis not present

## 2020-04-25 DIAGNOSIS — M542 Cervicalgia: Secondary | ICD-10-CM | POA: Diagnosis not present

## 2020-04-25 DIAGNOSIS — M546 Pain in thoracic spine: Secondary | ICD-10-CM | POA: Diagnosis not present

## 2020-04-25 DIAGNOSIS — M9903 Segmental and somatic dysfunction of lumbar region: Secondary | ICD-10-CM | POA: Diagnosis not present

## 2020-04-25 DIAGNOSIS — M9905 Segmental and somatic dysfunction of pelvic region: Secondary | ICD-10-CM | POA: Diagnosis not present

## 2020-04-25 DIAGNOSIS — M545 Low back pain: Secondary | ICD-10-CM | POA: Diagnosis not present

## 2020-04-25 DIAGNOSIS — M9901 Segmental and somatic dysfunction of cervical region: Secondary | ICD-10-CM | POA: Diagnosis not present

## 2020-04-25 DIAGNOSIS — M9902 Segmental and somatic dysfunction of thoracic region: Secondary | ICD-10-CM | POA: Diagnosis not present

## 2020-04-26 ENCOUNTER — Encounter: Payer: Self-pay | Admitting: Cardiothoracic Surgery

## 2020-04-26 ENCOUNTER — Other Ambulatory Visit: Payer: Self-pay

## 2020-04-26 ENCOUNTER — Ambulatory Visit: Payer: Medicare HMO | Admitting: Cardiothoracic Surgery

## 2020-04-26 ENCOUNTER — Ambulatory Visit
Admission: RE | Admit: 2020-04-26 | Discharge: 2020-04-26 | Disposition: A | Discharge status: Home / Self Care | Payer: Medicare HMO | Source: Ambulatory Visit | Attending: Cardiothoracic Surgery | Admitting: Cardiothoracic Surgery

## 2020-04-26 VITALS — BP 151/72 | HR 78 | Temp 97.6°F | Resp 18 | Ht 59.0 in | Wt 130.0 lb

## 2020-04-26 DIAGNOSIS — Z902 Acquired absence of lung [part of]: Secondary | ICD-10-CM | POA: Diagnosis not present

## 2020-04-26 DIAGNOSIS — C3490 Malignant neoplasm of unspecified part of unspecified bronchus or lung: Secondary | ICD-10-CM | POA: Diagnosis not present

## 2020-04-26 DIAGNOSIS — C3411 Malignant neoplasm of upper lobe, right bronchus or lung: Secondary | ICD-10-CM | POA: Diagnosis not present

## 2020-04-26 DIAGNOSIS — J984 Other disorders of lung: Secondary | ICD-10-CM | POA: Diagnosis not present

## 2020-04-26 DIAGNOSIS — I700 Atherosclerosis of aorta: Secondary | ICD-10-CM | POA: Diagnosis not present

## 2020-04-26 DIAGNOSIS — I2510 Atherosclerotic heart disease of native coronary artery without angina pectoris: Secondary | ICD-10-CM | POA: Diagnosis not present

## 2020-04-26 NOTE — Progress Notes (Addendum)
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 SURGICAL PROCEDURE:  Video bronchoscopy, right video-assisted thoracoscopy, right upper lobectomy with lymph node dissection, and placement of On-Q.   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 Patient is asymptomatic, respiratory status is good, she denies cough or hemoptysis. She notes she has had Covid vaccination. She denies any limitations due to shortness of breath.     Past Medical History:  Diagnosis Date   Anxiety    Dyspnea    with exertion   Essential hypertension, benign since the 1900's   Family history of adverse reaction to anesthesia    Children - N/V   History of blood transfusion    Hyperlipidemia    Other osteoporosis    Other thalassemia (HCC)      Social History   Tobacco Use  Smoking Status Never Smoker  Smokeless Tobacco Never Used    Social History   Substance and Sexual Activity  Alcohol Use No     Allergies  Allergen Reactions   Lisinopril Cough    Current Outpatient Medications  Medication Sig Dispense Refill   acetaminophen (TYLENOL) 325 MG tablet Take 2 tablets (650 mg total) by mouth every 6 (six) hours as needed for mild  pain.     amLODipine (NORVASC) 10 MG tablet Take 10 mg by mouth daily.     Ascorbic Acid (VITAMIN C) 500 MG CHEW Chew 1 each by mouth daily.     aspirin EC 81 MG tablet Take 81 mg by mouth daily.     Calcium-Phosphorus-Vitamin D [id number] MG-MG-UNIT CHEW Chew 1 each by mouth 2 (two) times daily.      diclofenac (VOLTAREN) 50 MG EC tablet Take 1 tablet by mouth daily as needed.     ferrous sulfate 325 (65 FE) MG tablet Take 325 mg by mouth daily with breakfast.     fexofenadine (ALLEGRA) 180 MG tablet Take 180 mg by mouth daily.     guaiFENesin (MUCINEX) 600 MG 12 hr tablet Take by mouth 2 (two) times daily.     hydrochlorothiazide (HYDRODIURIL) 25 MG tablet Take 1 tablet (25 mg total) by mouth daily. 93 tablet 4   ibuprofen (ADVIL) 200 MG tablet Take 200 mg by mouth every 6 (six) hours as needed.     meclizine (ANTIVERT) 12.5 MG tablet Take 12.5 mg by mouth every 6 (six) hours as needed.     Multiple Vitamin (MULTIVITAMIN WITH MINERALS) TABS tablet Take 1 tablet by mouth daily.     rosuvastatin (CRESTOR) 5 MG tablet Take 1 tablet (5 mg total) by mouth daily. 90 tablet 3   No current facility-administered medications for  this visit.       Physical Exam: BP (!) 151/72 (BP Location: Left Arm, Patient Position: Sitting, Cuff Size: Normal)   Pulse 78   Temp 97.6 F (36.4 C)   Resp 18   Ht 4\' 11"  (1.499 m)   Wt 130 lb (59 kg)   LMP  (LMP Unknown)   SpO2 98% Comment: RA  BMI 26.26 kg/m  General appearance: alert, cooperative and no distress Head: Normocephalic, without obvious abnormality, atraumatic Neck: no adenopathy, no carotid bruit, no JVD, supple, symmetrical, trachea midline and thyroid not enlarged, symmetric, no tenderness/mass/nodules Lymph nodes: Cervical, supraclavicular, and axillary nodes normal. Resp: clear to auscultation bilaterally Cardio: regular rate and rhythm, S1, S2 normal, no murmur, click, rub or gallop GI: soft, non-tender; bowel sounds normal; no masses,   no organomegaly Extremities: extremities normal, atraumatic, no cyanosis or edema Neurologic: Grossly normal  Diagnostic Studies & Laboratory data:     Recent Radiology Findings:  CT CHEST WO CONTRAST  Result Date: 04/26/2020 CLINICAL DATA:  Metastatic non-small cell lung cancer. Prior right VATS. Post first line therapy. EXAM: CT CHEST WITHOUT CONTRAST TECHNIQUE: Multidetector CT imaging of the chest was performed following the standard protocol without IV contrast. COMPARISON:  04/28/2019 FINDINGS: Cardiovascular: Coronary, aortic arch, and branch vessel atherosclerotic vascular disease. Mediastinum/Nodes: Small mediastinal lymph nodes are not pathologically enlarged. Lungs/Pleura: Right upper lobectomy. Wedge resection line medially in the right middle lobe. Scarring anteriorly in the left lower lobe and inferiorly in the lingula. No recurrent lung mass or significant nodule identified. Upper Abdomen: Contracted gallbladder with gallstones noted. Musculoskeletal: Unremarkable IMPRESSION: 1. No findings of recurrent malignancy. 2. Coronary, aortic arch, and branch vessel atherosclerotic vascular disease. 3. Right upper lobectomy. Wedge resection line medially in the right middle lobe. 4. Cholelithiasis. 5. Aortic atherosclerosis. Aortic Atherosclerosis (ICD10-I70.0). Electronically Signed   By: Van Clines M.D.   On: 04/26/2020 13:44  I have independently reviewed the above radiology studies  and reviewed the findings with the patient.    Ct Super D Chest Wo Contrast  Result Date: 11/11/2018 CLINICAL DATA:  Right lung cancer. EXAM: CT CHEST WITHOUT CONTRAST TECHNIQUE: Multidetector CT imaging of the chest was performed using thin slice collimation for electromagnetic bronchoscopy planning purposes, without intravenous contrast. COMPARISON:  08/12/2018 FINDINGS: Cardiovascular: The heart size is normal. No substantial pericardial effusion. Coronary artery calcification is evident.  Atherosclerotic calcification is noted in the wall of the thoracic aorta. Mediastinum/Nodes: No mediastinal lymphadenopathy. No evidence for gross hilar lymphadenopathy although assessment is limited by the lack of intravenous contrast on today's study. The esophagus has normal imaging features. There is no axillary lymphadenopathy. Lungs/Pleura: The central tracheobronchial airways are patent. Surgical changes again noted in the right lung. 8 mm right middle lobe nodule seen on the previous study has almost completely resolved in the interval with only a subtle 4 mm ground-glass opacity at this location today. No new suspicious nodule or mass. Chronic atelectasis or scarring in the lingula and left lower lobe, similar to prior. No pleural effusion. Upper Abdomen: Unremarkable. Musculoskeletal: No worrisome lytic or sclerotic osseous abnormality. IMPRESSION: 1. 8 mm right middle lobe nodule seen on the previous study has almost completely resolved in the interval with only a subtle 4 mm ground-glass opacity at this location today. 2. No new suspicious nodule or mass. 3.  Aortic Atherosclerois (ICD10-170.0) Electronically Signed   By: Misty Stanley M.D.   On: 11/11/2018 12:41      Ct Chest Wo Contrast  Result Date: 08/12/2018 CLINICAL DATA:  Right upper lobe lung adenocarcinoma status post right upper lobectomy 04/20/2017. Restaging. EXAM: CT CHEST WITHOUT CONTRAST TECHNIQUE: Multidetector CT imaging of the chest was performed following the standard protocol without IV contrast. COMPARISON:  04/26/2018 chest CT. FINDINGS: Cardiovascular: Normal heart size. No significant pericardial effusion/thickening. Left anterior descending coronary atherosclerosis. Atherosclerotic nonaneurysmal thoracic aorta. Normal caliber pulmonary arteries. Mediastinum/Nodes: No discrete thyroid nodules. Unremarkable esophagus. No pathologically enlarged axillary, mediastinal or hilar lymph nodes, noting limited sensitivity for the  detection of hilar adenopathy on this noncontrast study. Lungs/Pleura: No pneumothorax. No pleural effusion. Status post right upper lobectomy. There is an 8 mm solid right middle lobe pulmonary nodule (series 8/image 39), increased from 5 mm on 04/26/2018 chest CT. The previously described separate subsolid 6 mm right middle lobe pulmonary nodule has resolved. No acute consolidative airspace disease, lung masses or new significant pulmonary nodules. Scattered parenchymal bands in the lingula and left lower lobe are stable and compatible with postinfectious/postinflammatory scarring. Upper abdomen: No acute abnormality. Musculoskeletal: No aggressive appearing focal osseous lesions. Mild thoracic spondylosis. IMPRESSION: 1. One of the two previously visualized right middle lobe pulmonary nodules has demonstrated mild interval growth, now 8 mm and solid density. Pulmonary metastasis cannot be excluded. Continued close chest CT surveillance recommended at a minimum. 2. The other previously visualized right middle lobe subsolid pulmonary nodule has resolved. 3. No thoracic adenopathy or other potential findings of metastatic disease in the chest. Aortic Atherosclerosis (ICD10-I70.0). Electronically Signed   By: Ilona Sorrel M.D.   On: 08/12/2018 11:32   Ct Chest Wo Contrast  Result Date: 04/26/2018 CLINICAL DATA:  Right upper lobe lung adenocarcinoma status post right upper lobectomy 04/20/2017. Restaging. EXAM: CT CHEST WITHOUT CONTRAST TECHNIQUE: Multidetector CT imaging of the chest was performed following the standard protocol without IV contrast. COMPARISON:  10/22/2017 chest CT. FINDINGS: Cardiovascular: Normal heart size. No significant pericardial effusion/thickening. Left anterior descending coronary atherosclerosis. Atherosclerotic nonaneurysmal thoracic aorta. Normal caliber pulmonary arteries. Mediastinum/Nodes: No discrete thyroid nodules. Unremarkable esophagus. No pathologically enlarged axillary,  mediastinal or hilar lymph nodes, noting limited sensitivity for the detection of hilar adenopathy on this noncontrast study. Lungs/Pleura: No pneumothorax. No pleural effusion. Status post right upper lobectomy. No acute consolidative airspace disease or lung masses. Small parenchymal bands in the lingula and anterior left lower lobe, compatible with mild scarring or atelectasis. Two new subsolid right middle lobe pulmonary nodules, largest 6 mm (series 8/image 29). No additional significant pulmonary nodules. Upper abdomen: No acute abnormality. Musculoskeletal: No aggressive appearing focal osseous lesions. Mild thoracic spondylosis. IMPRESSION: 1. Two new subsolid right middle lobe pulmonary nodules, largest 6 mm, indeterminate. Recommend attention on follow-up chest CT in 3 months. 2. No thoracic adenopathy or other potential findings of metastatic disease in the chest. Aortic Atherosclerosis (ICD10-I70.0). Electronically Signed   By: Ilona Sorrel M.D.   On: 04/26/2018 13:02   I have independently reviewed the above radiology studies  and reviewed the findings with the patient.     Ct Chest Wo Contrast  Result Date: 10/22/2017 CLINICAL DATA:  Non-small cell lung cancer. Right upper lobe lesion removed April 20, 2017. EXAM: CT CHEST WITHOUT CONTRAST TECHNIQUE: Multidetector CT imaging of the chest was performed following the standard protocol without IV contrast. COMPARISON:  Chest CT 02/18/2017 and PET-CT 03/18/2017 FINDINGS: Cardiovascular: The heart is normal in size. No pericardial effusion. There is tortuosity, ectasia and calcification of the thoracic aorta. Coronary artery calcifications are noted. Mediastinum/Nodes: No mediastinal or  hilar mass or lymphadenopathy. The esophagus is grossly normal. Lungs/Pleura: Surgical changes from a right upper lobe lung resection. No findings for residual or recurrent tumor. No acute pulmonary process. No worrisome pulmonary lesions. No pleural effusion. Upper  Abdomen: No significant upper abdominal findings. Musculoskeletal: No breast masses, supraclavicular or axillary lymphadenopathy. No significant bony findings. No lytic or sclerotic bone lesions to suggest metastatic disease. IMPRESSION: 1. Status post resection of a right upper lobe pulmonary lesion without findings for residual or recurrent tumor. 2. No mediastinal or hilar mass or lymphadenopathy. 3. No worrisome pulmonary lesions or acute pulmonary process. Aortic Atherosclerosis (ICD10-I70.0). Electronically Signed   By: Marijo Sanes M.D.   On: 10/22/2017 12:32    I have independently reviewed the above radiology studies  and reviewed the findings with the patient.   Recent Lab Findings: Lab Results  Component Value Date   WBC 8.5 04/29/2017   HGB 7.4 (L) 04/29/2017   HCT 22.3 (L) 04/29/2017   PLT 293 04/29/2017   GLUCOSE 101 (H) 05/01/2017   CHOL 168 02/18/2018   TRIG 50 02/18/2018   HDL 90 02/18/2018   LDLDIRECT 137 (H) 11/13/2017   LDLCALC 68 02/18/2018   ALT 21 02/18/2018   AST 27 04/22/2017   NA 135 05/01/2017   K 4.4 05/01/2017   CL 101 05/01/2017   CREATININE 0.73 05/01/2017   BUN 9 05/01/2017   CO2 25 05/01/2017   INR 0.96 04/16/2017      Assessment / Plan:  #1 status post right upper lobectomy for stage Ia non-small cell carcinoma-now 3 years postop. Follow-up CT scan today shows no evidence of recurrence  We will continue to monitor with a follow-up CT scan of chest in 1 year  Grace Isaac MD      [address] [place] [address] Office [phone]   Beeper [phone]  04/26/2020 3:58 PM

## 2020-05-02 DIAGNOSIS — M546 Pain in thoracic spine: Secondary | ICD-10-CM | POA: Diagnosis not present

## 2020-05-02 DIAGNOSIS — M9903 Segmental and somatic dysfunction of lumbar region: Secondary | ICD-10-CM | POA: Diagnosis not present

## 2020-05-02 DIAGNOSIS — M545 Low back pain: Secondary | ICD-10-CM | POA: Diagnosis not present

## 2020-05-02 DIAGNOSIS — M542 Cervicalgia: Secondary | ICD-10-CM | POA: Diagnosis not present

## 2020-05-02 DIAGNOSIS — M9905 Segmental and somatic dysfunction of pelvic region: Secondary | ICD-10-CM | POA: Diagnosis not present

## 2020-05-02 DIAGNOSIS — M9902 Segmental and somatic dysfunction of thoracic region: Secondary | ICD-10-CM | POA: Diagnosis not present

## 2020-05-02 DIAGNOSIS — M9901 Segmental and somatic dysfunction of cervical region: Secondary | ICD-10-CM | POA: Diagnosis not present

## 2020-05-03 DIAGNOSIS — I700 Atherosclerosis of aorta: Secondary | ICD-10-CM | POA: Diagnosis not present

## 2020-05-03 DIAGNOSIS — Z299 Encounter for prophylactic measures, unspecified: Secondary | ICD-10-CM | POA: Diagnosis not present

## 2020-05-03 DIAGNOSIS — C3491 Malignant neoplasm of unspecified part of right bronchus or lung: Secondary | ICD-10-CM | POA: Diagnosis not present

## 2020-05-03 DIAGNOSIS — D561 Beta thalassemia: Secondary | ICD-10-CM | POA: Diagnosis not present

## 2020-05-03 DIAGNOSIS — I10 Essential (primary) hypertension: Secondary | ICD-10-CM | POA: Diagnosis not present

## 2020-05-07 DIAGNOSIS — M2042 Other hammer toe(s) (acquired), left foot: Secondary | ICD-10-CM | POA: Diagnosis not present

## 2020-05-07 DIAGNOSIS — M79675 Pain in left toe(s): Secondary | ICD-10-CM | POA: Diagnosis not present

## 2020-05-07 DIAGNOSIS — L851 Acquired keratosis [keratoderma] palmaris et plantaris: Secondary | ICD-10-CM | POA: Diagnosis not present

## 2020-05-23 DIAGNOSIS — M9905 Segmental and somatic dysfunction of pelvic region: Secondary | ICD-10-CM | POA: Diagnosis not present

## 2020-05-23 DIAGNOSIS — M545 Low back pain: Secondary | ICD-10-CM | POA: Diagnosis not present

## 2020-05-23 DIAGNOSIS — M9901 Segmental and somatic dysfunction of cervical region: Secondary | ICD-10-CM | POA: Diagnosis not present

## 2020-05-23 DIAGNOSIS — M542 Cervicalgia: Secondary | ICD-10-CM | POA: Diagnosis not present

## 2020-05-23 DIAGNOSIS — M546 Pain in thoracic spine: Secondary | ICD-10-CM | POA: Diagnosis not present

## 2020-05-23 DIAGNOSIS — M9902 Segmental and somatic dysfunction of thoracic region: Secondary | ICD-10-CM | POA: Diagnosis not present

## 2020-05-23 DIAGNOSIS — M9903 Segmental and somatic dysfunction of lumbar region: Secondary | ICD-10-CM | POA: Diagnosis not present

## 2020-05-28 DIAGNOSIS — I10 Essential (primary) hypertension: Secondary | ICD-10-CM | POA: Diagnosis not present

## 2020-05-28 DIAGNOSIS — M159 Polyosteoarthritis, unspecified: Secondary | ICD-10-CM | POA: Diagnosis not present

## 2020-06-06 DIAGNOSIS — M9903 Segmental and somatic dysfunction of lumbar region: Secondary | ICD-10-CM | POA: Diagnosis not present

## 2020-06-06 DIAGNOSIS — M545 Low back pain: Secondary | ICD-10-CM | POA: Diagnosis not present

## 2020-06-06 DIAGNOSIS — M9905 Segmental and somatic dysfunction of pelvic region: Secondary | ICD-10-CM | POA: Diagnosis not present

## 2020-06-06 DIAGNOSIS — M542 Cervicalgia: Secondary | ICD-10-CM | POA: Diagnosis not present

## 2020-06-06 DIAGNOSIS — M546 Pain in thoracic spine: Secondary | ICD-10-CM | POA: Diagnosis not present

## 2020-06-06 DIAGNOSIS — M9902 Segmental and somatic dysfunction of thoracic region: Secondary | ICD-10-CM | POA: Diagnosis not present

## 2020-06-06 DIAGNOSIS — M9901 Segmental and somatic dysfunction of cervical region: Secondary | ICD-10-CM | POA: Diagnosis not present

## 2020-06-11 DIAGNOSIS — R69 Illness, unspecified: Secondary | ICD-10-CM | POA: Diagnosis not present

## 2020-06-11 DIAGNOSIS — J069 Acute upper respiratory infection, unspecified: Secondary | ICD-10-CM | POA: Diagnosis not present

## 2020-06-11 DIAGNOSIS — I10 Essential (primary) hypertension: Secondary | ICD-10-CM | POA: Diagnosis not present

## 2020-06-11 DIAGNOSIS — Z299 Encounter for prophylactic measures, unspecified: Secondary | ICD-10-CM | POA: Diagnosis not present

## 2020-07-12 DIAGNOSIS — I10 Essential (primary) hypertension: Secondary | ICD-10-CM | POA: Diagnosis not present

## 2020-07-12 DIAGNOSIS — M159 Polyosteoarthritis, unspecified: Secondary | ICD-10-CM | POA: Diagnosis not present

## 2020-07-18 DIAGNOSIS — M9901 Segmental and somatic dysfunction of cervical region: Secondary | ICD-10-CM | POA: Diagnosis not present

## 2020-07-18 DIAGNOSIS — M546 Pain in thoracic spine: Secondary | ICD-10-CM | POA: Diagnosis not present

## 2020-07-18 DIAGNOSIS — M9903 Segmental and somatic dysfunction of lumbar region: Secondary | ICD-10-CM | POA: Diagnosis not present

## 2020-07-18 DIAGNOSIS — M9902 Segmental and somatic dysfunction of thoracic region: Secondary | ICD-10-CM | POA: Diagnosis not present

## 2020-07-18 DIAGNOSIS — M5136 Other intervertebral disc degeneration, lumbar region: Secondary | ICD-10-CM | POA: Diagnosis not present

## 2020-07-18 DIAGNOSIS — M542 Cervicalgia: Secondary | ICD-10-CM | POA: Diagnosis not present

## 2020-07-18 DIAGNOSIS — M9905 Segmental and somatic dysfunction of pelvic region: Secondary | ICD-10-CM | POA: Diagnosis not present

## 2020-07-24 DIAGNOSIS — Z7189 Other specified counseling: Secondary | ICD-10-CM | POA: Diagnosis not present

## 2020-07-24 DIAGNOSIS — E7800 Pure hypercholesterolemia, unspecified: Secondary | ICD-10-CM | POA: Diagnosis not present

## 2020-07-24 DIAGNOSIS — I10 Essential (primary) hypertension: Secondary | ICD-10-CM | POA: Diagnosis not present

## 2020-07-24 DIAGNOSIS — Z6825 Body mass index (BMI) 25.0-25.9, adult: Secondary | ICD-10-CM | POA: Diagnosis not present

## 2020-07-24 DIAGNOSIS — Z0000 Encounter for general adult medical examination without abnormal findings: Secondary | ICD-10-CM | POA: Diagnosis not present

## 2020-07-24 DIAGNOSIS — Z1339 Encounter for screening examination for other mental health and behavioral disorders: Secondary | ICD-10-CM | POA: Diagnosis not present

## 2020-07-24 DIAGNOSIS — Z299 Encounter for prophylactic measures, unspecified: Secondary | ICD-10-CM | POA: Diagnosis not present

## 2020-07-24 DIAGNOSIS — Z79899 Other long term (current) drug therapy: Secondary | ICD-10-CM | POA: Diagnosis not present

## 2020-07-24 DIAGNOSIS — M810 Age-related osteoporosis without current pathological fracture: Secondary | ICD-10-CM | POA: Diagnosis not present

## 2020-07-24 DIAGNOSIS — Z1331 Encounter for screening for depression: Secondary | ICD-10-CM | POA: Diagnosis not present

## 2020-07-24 DIAGNOSIS — R5383 Other fatigue: Secondary | ICD-10-CM | POA: Diagnosis not present

## 2020-07-30 ENCOUNTER — Other Ambulatory Visit (HOSPITAL_COMMUNITY): Payer: Self-pay | Admitting: Nurse Practitioner

## 2020-07-30 DIAGNOSIS — Z1231 Encounter for screening mammogram for malignant neoplasm of breast: Secondary | ICD-10-CM

## 2020-07-31 DIAGNOSIS — C3491 Malignant neoplasm of unspecified part of right bronchus or lung: Secondary | ICD-10-CM | POA: Diagnosis not present

## 2020-07-31 DIAGNOSIS — D649 Anemia, unspecified: Secondary | ICD-10-CM | POA: Diagnosis not present

## 2020-07-31 DIAGNOSIS — Z299 Encounter for prophylactic measures, unspecified: Secondary | ICD-10-CM | POA: Diagnosis not present

## 2020-07-31 DIAGNOSIS — I700 Atherosclerosis of aorta: Secondary | ICD-10-CM | POA: Diagnosis not present

## 2020-07-31 DIAGNOSIS — I10 Essential (primary) hypertension: Secondary | ICD-10-CM | POA: Diagnosis not present

## 2020-08-07 ENCOUNTER — Encounter (INDEPENDENT_AMBULATORY_CARE_PROVIDER_SITE_OTHER): Payer: Self-pay | Admitting: *Deleted

## 2020-08-10 DIAGNOSIS — I10 Essential (primary) hypertension: Secondary | ICD-10-CM | POA: Diagnosis not present

## 2020-08-10 DIAGNOSIS — M159 Polyosteoarthritis, unspecified: Secondary | ICD-10-CM | POA: Diagnosis not present

## 2020-08-15 DIAGNOSIS — M5136 Other intervertebral disc degeneration, lumbar region: Secondary | ICD-10-CM | POA: Diagnosis not present

## 2020-08-15 DIAGNOSIS — M542 Cervicalgia: Secondary | ICD-10-CM | POA: Diagnosis not present

## 2020-08-15 DIAGNOSIS — M9902 Segmental and somatic dysfunction of thoracic region: Secondary | ICD-10-CM | POA: Diagnosis not present

## 2020-08-15 DIAGNOSIS — M9905 Segmental and somatic dysfunction of pelvic region: Secondary | ICD-10-CM | POA: Diagnosis not present

## 2020-08-15 DIAGNOSIS — M546 Pain in thoracic spine: Secondary | ICD-10-CM | POA: Diagnosis not present

## 2020-08-15 DIAGNOSIS — M9903 Segmental and somatic dysfunction of lumbar region: Secondary | ICD-10-CM | POA: Diagnosis not present

## 2020-08-15 DIAGNOSIS — M9901 Segmental and somatic dysfunction of cervical region: Secondary | ICD-10-CM | POA: Diagnosis not present

## 2020-09-10 ENCOUNTER — Telehealth (INDEPENDENT_AMBULATORY_CARE_PROVIDER_SITE_OTHER): Payer: Self-pay | Admitting: Gastroenterology

## 2020-09-10 ENCOUNTER — Encounter (INDEPENDENT_AMBULATORY_CARE_PROVIDER_SITE_OTHER): Payer: Self-pay | Admitting: Gastroenterology

## 2020-09-10 ENCOUNTER — Ambulatory Visit (INDEPENDENT_AMBULATORY_CARE_PROVIDER_SITE_OTHER): Payer: Medicare HMO | Admitting: Gastroenterology

## 2020-09-10 ENCOUNTER — Other Ambulatory Visit: Payer: Self-pay

## 2020-09-10 VITALS — BP 139/73 | HR 76 | Temp 98.3°F | Ht 59.0 in | Wt 132.0 lb

## 2020-09-10 DIAGNOSIS — D582 Other hemoglobinopathies: Secondary | ICD-10-CM

## 2020-09-10 DIAGNOSIS — R072 Precordial pain: Secondary | ICD-10-CM | POA: Diagnosis not present

## 2020-09-10 MED ORDER — OMEPRAZOLE 40 MG PO CPDR: 40.0000 mg | DELAYED_RELEASE_CAPSULE | Freq: Every day | ORAL | 0 refills | Status: DC

## 2020-09-10 NOTE — Telephone Encounter (Signed)
I called the patient today to inform her about the results of her iron stores that were received today in the afternoon from Dr. Marcial Pacas office.  Her labs from 07/24/2020 showed normal iron stores with iron of 129, ferritin 226, iron saturation of 39% and TIBC of 245.  I explained to her that her mildly low hemoglobin was likely related to her with thalassemia and no further endoscopic investigation was warranted at this point.  She will need to follow-up with her PCP and possibly with hematology if there is still concern about these alterations.  [name], MD Gastroenterology and Hepatology [facility]

## 2020-09-10 NOTE — Patient Instructions (Addendum)
Will request iron store results Start omeprazole 40 mg qday

## 2020-09-10 NOTE — Progress Notes (Signed)
[name], M.D. Gastroenterology & Hepatology [facility] [address] [place], [place] [address] Primary Care Physician: [name], MD [address] [place] [address]  Referring MD: PCP  I will communicate my assessment and recommendations to the referring MD via EMR. Note: Occasional unusual wording and randomly placed punctuation marks may result from the use of speech recognition technology to transcribe this document"  Chief Complaint: Anemia and retrosternal discomfort  History of Present Illness: Dawn Lin is a 79 y.o. female with PMH beta thalassemia, anxiety, CAD, R lung cancer s/p partial lung resection, HTN and HLD, who presents for evaluation of anemia and retrosternal discomfort.  Patient states that for the last couple of months she as felt persistent fatigue, with occasional shortness of breath with exertion. She reports that she was found to be anemic and was referred to our clinic - patient brings labs performed on 07/24/2020 which showed CBC with hemoglobin of 10.7, MCV 70, RDW 18.8, white blood cell count 3.4, platelets 414, CMP with BUN 14 and creatinine 0.72, liver enzymes and electrolytes were within normal limits.  Due to this finding the patient was started on iron supplementation and was referred to our clinic.  She reports that she took the iron supplementation for a month but she was advised to stop it as she was recently found to have normal iron stores (these labs are not available today).  On another note, the patient states that she has some occasional retrosternal discomfort for the last couple of weeks but she does not take any medications for this.  She reports that she has been under stress due to family stressors (her son is separating and she has a sister with metastatic breast cancer), she believes that this is related to some of her symptoms.  The patient denies having any  vomiting, fever, chills, hematochezia, melena, hematemesis, abdominal distention, abdominal pain, diarrhea, jaundice, pruritus or weight loss.  She reports she was diagnosed with beta thalassemia as an adult. She reports that she used to take iron supplementation before her diagnosis due to a long standing history of anemia, but stopped taking iron at that time.  Last EGD: never Last Colonoscopy:15 years ago, normal per patient, had Cologuard 2 years ago was negative  FHx: sister Crohn's diseases, neg for any other gastrointestinal/liver disease, sister metastatic breast cancer Social: neg smoking, alcohol or illicit drug use Surgical: hernia repair  Past Medical History: Past Medical History:  Diagnosis Date  . Anxiety   . Dyspnea    with exertion  . 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 Family history of adverse reaction to anesthesia    Children - N/V  . History of blood transfusion   . Hyperlipidemia   . Other osteoporosis   . Other thalassemia [facility])     Past Surgical History: Past Surgical History:  Procedure Laterality Date  . COLONOSCOPY    . INGUINAL HERNIA REPAIR Left   . LUMBAR DISC SURGERY  1990's   for severe pain and sciatica  . PARTIAL HYSTERECTOMY  1970's   for bleeding and prolapse  . VIDEO ASSISTED THORACOSCOPY (VATS)/ LOBECTOMY Right 04/20/2017   Procedure: VIDEO ASSISTED THORACOSCOPY (VATS)/RIGHT UPPER LOBECTOMY;  Surgeon: Grace Isaac, MD;  Location: [place];  Service: Thoracic;  Laterality: Right;  Marland Kitchen VIDEO BRONCHOSCOPY N/A 04/20/2017   Procedure: VIDEO BRONCHOSCOPY;  Surgeon: Grace Isaac, MD;  Location: [place];  Service: Thoracic;  Laterality: N/A;  Family History: Family History  Problem Relation Age of Onset  . Hypertension Mother   . Heart attack Sister 79    Social History: Social History   Tobacco Use  Smoking Status Never Smoker  Smokeless Tobacco Never Used   Social History   Substance and Sexual Activity   Alcohol Use No   Social History   Substance and Sexual Activity  Drug Use No    Allergies: Allergies  Allergen Reactions  . Lisinopril Cough    Medications: Current Outpatient Medications  Medication Sig Dispense Refill  . acetaminophen (TYLENOL) 325 MG tablet Take 2 tablets (650 mg total) by mouth every 6 (six) hours as needed for mild pain.    . Alpha-D-Galactosidase (BEANO PO) Take 1 capsule by mouth daily.    Marland Kitchen amLODipine (NORVASC) 10 MG tablet Take 10 mg by mouth daily.    . Ascorbic Acid (VITAMIN C) 500 MG CHEW Chew 1 each by mouth daily.    Marland Kitchen aspirin EC 81 MG tablet Take 81 mg by mouth daily.    . Calcium-Phosphorus-Vitamin D [id number] MG-MG-UNIT CHEW Chew 1 each by mouth 2 (two) times daily.     . fexofenadine (ALLEGRA) 180 MG tablet Take 180 mg by mouth daily.    Marland Kitchen guaiFENesin (MUCINEX) 600 MG 12 hr tablet Take by mouth 2 (two) times daily.    . hydrochlorothiazide (HYDRODIURIL) 25 MG tablet Take 1 tablet (25 mg total) by mouth daily. 93 tablet 4  . ibuprofen (ADVIL) 200 MG tablet Take 200 mg by mouth every 6 (six) hours as needed.    . meclizine (ANTIVERT) 12.5 MG tablet Take 12.5 mg by mouth every 6 (six) hours as needed.    . Multiple Vitamin (MULTIVITAMIN WITH MINERALS) TABS tablet Take 1 tablet by mouth daily.    . rosuvastatin (CRESTOR) 5 MG tablet Take 1 tablet (5 mg total) by mouth daily. 90 tablet 3  . simethicone (GAS-X) 80 MG chewable tablet Chew 80 mg by mouth every 6 (six) hours as needed for flatulence.     No current facility-administered medications for this visit.    Review of Systems: GENERAL: negative for malaise, night sweats HEENT: No changes in hearing or vision, no nose bleeds or other nasal problems. NECK: Negative for lumps, goiter, pain and significant neck swelling RESPIRATORY: Negative for cough, wheezing CARDIOVASCULAR: Negative for chest pain, leg swelling, palpitations, orthopnea GI: SEE HPI MUSCULOSKELETAL: Negative for joint pain  or swelling, back pain, and muscle pain. SKIN: Negative for lesions, rash PSYCH: Negative for sleep disturbance, mood disorder and recent psychosocial stressors. HEMATOLOGY Negative for prolonged bleeding, bruising easily, and swollen nodes. ENDOCRINE: Negative for cold or heat intolerance, polyuria, polydipsia and goiter. NEURO: negative for tremor, gait imbalance, syncope and seizures. The remainder of the review of systems is noncontributory.   Physical Exam: BP 139/73 (BP Location: Left Arm, Patient Position: Sitting, Cuff Size: Small)   Pulse 76   Temp 98.3 F (36.8 C) (Oral)   Ht 4\' 11"  (1.499 m)   Wt 132 lb (59.9 kg)   LMP  (LMP Unknown)   BMI 26.66 kg/m  GENERAL: The patient is AO x3, in no acute distress. HEENT: Head is normocephalic and atraumatic. EOMI are intact. Mouth is well hydrated and without lesions. NECK: Supple. No masses LUNGS: Clear to auscultation. No presence of rhonchi/wheezing/rales. Adequate chest expansion HEART: RRR, normal s1 and s2. ABDOMEN: Soft, nontender, no guarding, no peritoneal signs, and nondistended. BS +. No masses. EXTREMITIES: Without any cyanosis,  clubbing, rash, lesions or edema. NEUROLOGIC: AOx3, no focal motor deficit. SKIN: no jaundice, no rashes   Imaging/Labs: as above  I personally reviewed and interpreted the available labs, imaging and endoscopic files.  Impression and Plan: Dawn Lin is a 79 y.o. female with PMH beta thalassemia, anxiety, CAD, R lung cancer s/p partial lung resection, HTN and HLD, who presents for evaluation of anemia and retrosternal discomfort.  In terms of her anemia, the patient has had a longstanding history of anemia which I consider is related to her previous diagnosis of but thalassemia.  Unfortunately, we do not have any of the recently checked iron stores, but based on the information provided by the patient her stores were normal which is consistent with thalassemia.  We will request  this labs, and if there is evidence of iron deficiency anemia we will proceed with endoscopic evaluation.  For now, will defer any colonoscopy that the patient had a negative Cologuard 2 years ago and does not require any more colonoscopies for screening purposes.  If her iron stores are adequate and there is still concern for anemia, I would recommend an evaluation by hematologist.  On the right hand, in terms of her retrosternal discomfort, her symptoms could be related to stress and functional heartburn given the recent onset of her symptoms, but I will start her on omeprazole 40 mg every day for 3 months and assess her symptom response.  If her symptoms are persistent, I will consider performing an EGD for further evaluation.  - Request iron store results - Start omeprazole 40 mg qday - RTC 3 months  All questions were answered.      [name], MD Gastroenterology and Hepatology [facility]

## 2020-09-11 DIAGNOSIS — R69 Illness, unspecified: Secondary | ICD-10-CM | POA: Diagnosis not present

## 2020-09-11 DIAGNOSIS — I10 Essential (primary) hypertension: Secondary | ICD-10-CM | POA: Diagnosis not present

## 2020-09-11 DIAGNOSIS — M159 Polyosteoarthritis, unspecified: Secondary | ICD-10-CM | POA: Diagnosis not present

## 2020-09-25 ENCOUNTER — Ambulatory Visit
Admission: EM | Admit: 2020-09-25 | Discharge: 2020-09-25 | Disposition: A | Discharge status: Home / Self Care | Payer: Medicare HMO | Attending: Emergency Medicine | Admitting: Emergency Medicine

## 2020-09-25 ENCOUNTER — Encounter: Payer: Self-pay | Admitting: Emergency Medicine

## 2020-09-25 ENCOUNTER — Other Ambulatory Visit: Payer: Self-pay

## 2020-09-25 DIAGNOSIS — Z1152 Encounter for screening for COVID-19: Secondary | ICD-10-CM | POA: Diagnosis not present

## 2020-09-25 DIAGNOSIS — C3491 Malignant neoplasm of unspecified part of right bronchus or lung: Secondary | ICD-10-CM | POA: Diagnosis not present

## 2020-09-25 DIAGNOSIS — Z789 Other specified health status: Secondary | ICD-10-CM | POA: Diagnosis not present

## 2020-09-25 DIAGNOSIS — J069 Acute upper respiratory infection, unspecified: Secondary | ICD-10-CM | POA: Diagnosis not present

## 2020-09-25 DIAGNOSIS — Z299 Encounter for prophylactic measures, unspecified: Secondary | ICD-10-CM | POA: Diagnosis not present

## 2020-09-25 DIAGNOSIS — R059 Cough, unspecified: Secondary | ICD-10-CM

## 2020-09-25 MED ORDER — BENZONATATE 100 MG PO CAPS: 100.0000 mg | ORAL_CAPSULE | Freq: Three times a day (TID) | ORAL | 0 refills | Status: DC | PRN

## 2020-09-25 NOTE — ED Triage Notes (Signed)
Pt reports she has a chronic cough but it has gotten worse over the past week. Would like covid test because she was around a group of people yesterday.

## 2020-09-25 NOTE — ED Provider Notes (Signed)
[place]   [id number] 09/25/20 Arrival Time: 1522   CC: COVID symptoms  SUBJECTIVE: History from: patient.  Dawn Lin is a 79 y.o. female presented to the urgent care with a complaint of chronic cough that has getting worse over the past week.  Stated primary care already prescribed prednisone and amoxicillin.  Denies known sick exposure to COVID, flu or strep.  Denies recent travel.  Has tried OTC medication without relief relief.  Denies aggravating factors.  Denies previous symptoms in the past.   Denies fever, chills, fatigue, sinus pain, rhinorrhea, sore throat, SOB, wheezing, chest pain, nausea, changes in bowel or bladder habits.     ROS: As per HPI.  All other pertinent ROS negative.      Past Medical History:  Diagnosis Date  . Anxiety   . Dyspnea    with exertion  . 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 Family history of adverse reaction to anesthesia    Children - N/V  . History of blood transfusion   . Hyperlipidemia   . Other osteoporosis   . Other thalassemia [facility])    Past Surgical History:  Procedure Laterality Date  . COLONOSCOPY    . INGUINAL HERNIA REPAIR Left   . LUMBAR DISC SURGERY  1990's   for severe pain and sciatica  . PARTIAL HYSTERECTOMY  1970's   for bleeding and prolapse  . VIDEO ASSISTED THORACOSCOPY (VATS)/ LOBECTOMY Right 04/20/2017   Procedure: VIDEO ASSISTED THORACOSCOPY (VATS)/RIGHT UPPER LOBECTOMY;  Surgeon: Grace Isaac, MD;  Location: [place];  Service: Thoracic;  Laterality: Right;  Marland Kitchen VIDEO BRONCHOSCOPY N/A 04/20/2017   Procedure: VIDEO BRONCHOSCOPY;  Surgeon: Grace Isaac, MD;  Location: [facility] OR;  Service: Thoracic;  Laterality: N/A;   Allergies  Allergen Reactions  . Lisinopril Cough   No current facility-administered medications on file prior to encounter.   Current Outpatient Medications on File Prior to Encounter  Medication Sig Dispense Refill  . acetaminophen (TYLENOL) 325  MG tablet Take 2 tablets (650 mg total) by mouth every 6 (six) hours as needed for mild pain.    . Alpha-D-Galactosidase (BEANO PO) Take 1 capsule by mouth daily.    Marland Kitchen amLODipine (NORVASC) 10 MG tablet Take 10 mg by mouth daily.    . Ascorbic Acid (VITAMIN C) 500 MG CHEW Chew 1 each by mouth daily.    Marland Kitchen aspirin EC 81 MG tablet Take 81 mg by mouth daily.    . Calcium-Phosphorus-Vitamin D [id number] MG-MG-UNIT CHEW Chew 1 each by mouth 2 (two) times daily.     . fexofenadine (ALLEGRA) 180 MG tablet Take 180 mg by mouth daily.    Marland Kitchen guaiFENesin (MUCINEX) 600 MG 12 hr tablet Take by mouth 2 (two) times daily.    . hydrochlorothiazide (HYDRODIURIL) 25 MG tablet Take 1 tablet (25 mg total) by mouth daily. 93 tablet 4  . ibuprofen (ADVIL) 200 MG tablet Take 200 mg by mouth every 6 (six) hours as needed.    . meclizine (ANTIVERT) 12.5 MG tablet Take 12.5 mg by mouth every 6 (six) hours as needed.    . Multiple Vitamin (MULTIVITAMIN WITH MINERALS) TABS tablet Take 1 tablet by mouth daily.    Marland Kitchen omeprazole (PRILOSEC) 40 MG capsule Take 1 capsule (40 mg total) by mouth daily. 90 capsule 0  . rosuvastatin (CRESTOR) 5 MG tablet Take 1 tablet (5 mg total) by mouth daily. 90 tablet 3  . simethicone (GAS-X) 80 MG chewable tablet Chew  80 mg by mouth every 6 (six) hours as needed for flatulence.     Social History   Socioeconomic History  . Marital status: Widowed    Spouse name: Not on file  . Number of children: Not on file  . Years of education: Not on file  . Highest education level: Not on file  Occupational History  . Occupation: retired  Tobacco Use  . Smoking status: Never Smoker  . Smokeless tobacco: Never Used  Vaping Use  . Vaping Use: Never used  Substance and Sexual Activity  . Alcohol use: No  . Drug use: No  . Sexual activity: Never  Other Topics Concern  . Not on file  Social History Narrative   Mrs. Waters is married. Her husband has been chronically ill for 6 years (2011). He  has metastatic prostate cancer and multiple myeloma. 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 She has a lot of family support.    Social Determinants of Health   Financial Resource Strain: Not on file  Food Insecurity: Not on file  Transportation Needs: Not on file  Physical Activity: Not on file  Stress: Not on file  Social Connections: Not on file  Intimate Partner Violence: Not on file   Family History  Problem Relation Age of Onset  . Hypertension Mother   . Heart attack Sister 26    OBJECTIVE:  Vitals:   09/25/20 1529  BP: 134/72  Pulse: 81  Resp: 19  Temp: 99 F (37.2 C)  TempSrc: Oral  SpO2: 97%  Weight: 131 lb (59.4 kg)  Height: '4\' 11"'  (1.499 m)     General appearance: alert; appears fatigued, but nontoxic; speaking in full sentences and tolerating own secretions HEENT: NCAT; Ears: EACs clear, TMs pearly gray; Eyes: PERRL.  EOM grossly intact. Sinuses: nontender; Nose: nares patent without rhinorrhea, Throat: oropharynx clear, tonsils non erythematous or enlarged, uvula midline  Neck: supple without LAD Lungs: unlabored respirations, symmetrical air entry; cough: moderate; no respiratory distress; CTAB Heart: regular rate and rhythm.  Radial pulses 2+ symmetrical bilaterally Skin: warm and dry Psychological: alert and cooperative; normal mood and affect  LABS:  No results found for this or any previous visit (from the past 24 hour(s)).   ASSESSMENT & PLAN:  1. Cough   2. Encounter for screening for COVID-19     Meds ordered this encounter  Medications  . benzonatate (TESSALON) 100 MG capsule    Sig: Take 1 capsule (100 mg total) by mouth 3 (three) times daily as needed for cough.    Dispense:  30 capsule    Refill:  0    Discharge Inastructions   COVID testing ordered.  It will take between 2-7 days for test results.  Someone will contact you regarding abnormal results.    In the meantime: You should remain isolated in your home  for 10 days from symptom onset AND greater than 24 hours after symptoms resolution (absence of fever without the use of fever-reducing medication and improvement in respiratory symptoms), whichever is longer Get plenty of rest and push fluids Tessalon Perles prescribed for cough Continue all other medication prescribed by PCP as directed e Use medications daily for symptom relief Use OTC medications like ibuprofen or tylenol as needed fever or pain Call or go to the ED if you have any new or worsening symptoms such as fever, worsening cough, shortness of breath, chest tightness, chest pain, turning blue, changes in mental status,  etc...   Reviewed expectations re: course of current medical issues. Questions answered. Outlined signs and symptoms indicating need for more acute intervention. Patient verbalized understanding. After Visit Summary given.         Emerson Monte, [place] 09/25/20 1541

## 2020-09-25 NOTE — Discharge Instructions (Signed)
COVID testing ordered.  It will take between 2-7 days for test results.  Someone will contact you regarding abnormal results.    In the meantime: You should remain isolated in your home for 10 days from symptom onset AND greater than 24 hours after symptoms resolution (absence of fever without the use of fever-reducing medication and improvement in respiratory symptoms), whichever is longer Get plenty of rest and push fluids Tessalon Perles prescribed for cough Continue all other medication prescribed by PCP as directed e Use medications daily for symptom relief Use OTC medications like ibuprofen or tylenol as needed fever or pain Call or go to the ED if you have any new or worsening symptoms such as fever, worsening cough, shortness of breath, chest tightness, chest pain, turning blue, changes in mental status, etc..Marland Kitchen

## 2020-09-26 LAB — NOVEL CORONAVIRUS, NAA: SARS-CoV-2, NAA: NOT DETECTED

## 2020-09-26 LAB — SARS-COV-2, NAA 2 DAY TAT

## 2020-09-28 ENCOUNTER — Other Ambulatory Visit: Payer: Self-pay | Admitting: Cardiology

## 2020-10-11 DIAGNOSIS — I10 Essential (primary) hypertension: Secondary | ICD-10-CM | POA: Diagnosis not present

## 2020-10-11 DIAGNOSIS — M159 Polyosteoarthritis, unspecified: Secondary | ICD-10-CM | POA: Diagnosis not present

## 2020-10-12 DIAGNOSIS — I10 Essential (primary) hypertension: Secondary | ICD-10-CM | POA: Diagnosis not present

## 2020-10-12 DIAGNOSIS — E559 Vitamin D deficiency, unspecified: Secondary | ICD-10-CM | POA: Diagnosis not present

## 2020-10-12 DIAGNOSIS — M810 Age-related osteoporosis without current pathological fracture: Secondary | ICD-10-CM | POA: Diagnosis not present

## 2020-10-12 DIAGNOSIS — M1990 Unspecified osteoarthritis, unspecified site: Secondary | ICD-10-CM | POA: Diagnosis not present

## 2020-10-23 DIAGNOSIS — I10 Essential (primary) hypertension: Secondary | ICD-10-CM | POA: Diagnosis not present

## 2020-10-23 DIAGNOSIS — Z299 Encounter for prophylactic measures, unspecified: Secondary | ICD-10-CM | POA: Diagnosis not present

## 2020-10-23 DIAGNOSIS — C3491 Malignant neoplasm of unspecified part of right bronchus or lung: Secondary | ICD-10-CM | POA: Diagnosis not present

## 2020-10-23 DIAGNOSIS — I700 Atherosclerosis of aorta: Secondary | ICD-10-CM | POA: Diagnosis not present

## 2020-10-23 DIAGNOSIS — Z789 Other specified health status: Secondary | ICD-10-CM | POA: Diagnosis not present

## 2020-10-23 DIAGNOSIS — D509 Iron deficiency anemia, unspecified: Secondary | ICD-10-CM | POA: Diagnosis not present

## 2020-10-25 DIAGNOSIS — D561 Beta thalassemia: Secondary | ICD-10-CM | POA: Diagnosis not present

## 2020-10-25 DIAGNOSIS — I10 Essential (primary) hypertension: Secondary | ICD-10-CM | POA: Diagnosis not present

## 2020-10-25 DIAGNOSIS — Z299 Encounter for prophylactic measures, unspecified: Secondary | ICD-10-CM | POA: Diagnosis not present

## 2020-10-25 DIAGNOSIS — D649 Anemia, unspecified: Secondary | ICD-10-CM | POA: Diagnosis not present

## 2020-10-25 DIAGNOSIS — I700 Atherosclerosis of aorta: Secondary | ICD-10-CM | POA: Diagnosis not present

## 2020-10-25 DIAGNOSIS — C3491 Malignant neoplasm of unspecified part of right bronchus or lung: Secondary | ICD-10-CM | POA: Diagnosis not present

## 2020-11-01 ENCOUNTER — Ambulatory Visit (HOSPITAL_COMMUNITY): Payer: Medicare HMO

## 2020-11-08 ENCOUNTER — Ambulatory Visit (HOSPITAL_COMMUNITY)
Admission: RE | Admit: 2020-11-08 | Discharge: 2020-11-08 | Disposition: A | Discharge status: Home / Self Care | Payer: Medicare HMO | Source: Ambulatory Visit | Attending: Nurse Practitioner | Admitting: Nurse Practitioner

## 2020-11-08 ENCOUNTER — Encounter (HOSPITAL_COMMUNITY): Payer: Self-pay

## 2020-11-08 ENCOUNTER — Other Ambulatory Visit: Payer: Self-pay

## 2020-11-08 DIAGNOSIS — Z1231 Encounter for screening mammogram for malignant neoplasm of breast: Secondary | ICD-10-CM | POA: Insufficient documentation

## 2020-11-12 DIAGNOSIS — M810 Age-related osteoporosis without current pathological fracture: Secondary | ICD-10-CM | POA: Diagnosis not present

## 2020-11-13 DIAGNOSIS — D563 Thalassemia minor: Secondary | ICD-10-CM | POA: Diagnosis not present

## 2020-11-13 DIAGNOSIS — C3411 Malignant neoplasm of upper lobe, right bronchus or lung: Secondary | ICD-10-CM | POA: Diagnosis not present

## 2020-11-16 ENCOUNTER — Encounter (INDEPENDENT_AMBULATORY_CARE_PROVIDER_SITE_OTHER): Payer: Self-pay

## 2020-11-22 DIAGNOSIS — I700 Atherosclerosis of aorta: Secondary | ICD-10-CM | POA: Diagnosis not present

## 2020-11-22 DIAGNOSIS — R69 Illness, unspecified: Secondary | ICD-10-CM | POA: Diagnosis not present

## 2020-11-22 DIAGNOSIS — I10 Essential (primary) hypertension: Secondary | ICD-10-CM | POA: Diagnosis not present

## 2020-11-22 DIAGNOSIS — Z299 Encounter for prophylactic measures, unspecified: Secondary | ICD-10-CM | POA: Diagnosis not present

## 2020-11-30 DIAGNOSIS — Z7189 Other specified counseling: Secondary | ICD-10-CM | POA: Diagnosis not present

## 2020-11-30 DIAGNOSIS — C3491 Malignant neoplasm of unspecified part of right bronchus or lung: Secondary | ICD-10-CM | POA: Diagnosis not present

## 2020-11-30 DIAGNOSIS — D563 Thalassemia minor: Secondary | ICD-10-CM | POA: Diagnosis not present

## 2020-12-05 ENCOUNTER — Other Ambulatory Visit (INDEPENDENT_AMBULATORY_CARE_PROVIDER_SITE_OTHER): Payer: Self-pay | Admitting: Gastroenterology

## 2020-12-05 DIAGNOSIS — R072 Precordial pain: Secondary | ICD-10-CM

## 2020-12-13 ENCOUNTER — Other Ambulatory Visit: Payer: Self-pay

## 2020-12-13 ENCOUNTER — Ambulatory Visit (INDEPENDENT_AMBULATORY_CARE_PROVIDER_SITE_OTHER): Payer: Medicare HMO | Admitting: Gastroenterology

## 2020-12-13 ENCOUNTER — Telehealth (INDEPENDENT_AMBULATORY_CARE_PROVIDER_SITE_OTHER): Payer: Self-pay

## 2020-12-13 ENCOUNTER — Encounter (INDEPENDENT_AMBULATORY_CARE_PROVIDER_SITE_OTHER): Payer: Self-pay

## 2020-12-13 ENCOUNTER — Encounter (INDEPENDENT_AMBULATORY_CARE_PROVIDER_SITE_OTHER): Payer: Self-pay | Admitting: Gastroenterology

## 2020-12-13 VITALS — BP 132/78 | HR 82 | Temp 98.5°F | Ht 59.0 in | Wt 127.0 lb

## 2020-12-13 DIAGNOSIS — D582 Other hemoglobinopathies: Secondary | ICD-10-CM | POA: Diagnosis not present

## 2020-12-13 DIAGNOSIS — K219 Gastro-esophageal reflux disease without esophagitis: Secondary | ICD-10-CM | POA: Diagnosis not present

## 2020-12-13 MED ORDER — PLENVU 140 G PO SOLR: 1.0000 | Freq: Once | ORAL | 0 refills | Status: AC

## 2020-12-13 NOTE — Progress Notes (Signed)
[name], M.D. Gastroenterology & Hepatology [facility] [address] [place], [place] [address]  Primary Care Physician: [name], MD [place] [address]  I will communicate my assessment and recommendations to the referring MD via EMR.  Problems: 1. Microcytic anemia 2. Beta thalassemia 3. Biliary colic 4. GERD  History of Present Illness: Dawn Lin is a 80 y.o. 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  The patient was last seen on 09/10/2020. At that time, the patient was prescribed omeprazole for episode of retrosternal discomfort.  She took the medication compliantly, however the patient developed flatulence with the use of the medication and stopped taking it after taking it for two weeks. She states has not presented any more chest discomfort since she stopped her medication and feels well.  Patient reports that on she had an episode of RUQ pain with nausea and vomiting, which lasted close to a 2 hours. The episode resolved on its own.  Never had a similar episode in the past but says that the episode was very severe.  Notably, she had a history of cholelithiasis found on CT chest performed on 04/26/2020. The patient denies having any nausea, vomiting, fever, chills, hematochezia, melena, hematemesis, abdominal distention,  diarrhea, jaundice, pruritus or weight loss.  She saw Dr. Federico Flake (hematologist) who is seeing her for management of her beta thalassemia.  The patient was noted to have drop in her Hb to 9.4 on 11/13/2020.  Last iron stores at [facility] showed a ferritin of 86.6, decreased haptoglobin on 11/13/2020.  Last EGD: never Last Colonoscopy:15 years ago, normal per patient, had Cologuard 2 years ago was negative  Past Medical History: Past Medical History:  Diagnosis Date  . Anxiety    . Dyspnea    with exertion  . 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 Family history of adverse reaction to anesthesia    Children - N/V  . History of blood transfusion   . Hyperlipidemia   . Other osteoporosis   . Other thalassemia [facility])     Past Surgical History: Past Surgical History:  Procedure Laterality Date  . COLONOSCOPY    . INGUINAL HERNIA REPAIR Left   . LUMBAR DISC SURGERY  1990's   for severe pain and sciatica  . PARTIAL HYSTERECTOMY  1970's   for bleeding and prolapse  . VIDEO ASSISTED THORACOSCOPY (VATS)/ LOBECTOMY Right 04/20/2017   Procedure: VIDEO ASSISTED THORACOSCOPY (VATS)/RIGHT UPPER LOBECTOMY;  Surgeon: Grace Isaac, MD;  Location: [place];  Service: Thoracic;  Laterality: Right;  Marland Kitchen VIDEO BRONCHOSCOPY N/A 04/20/2017   Procedure: VIDEO BRONCHOSCOPY;  Surgeon: Grace Isaac, MD;  Location: [facility] OR;  Service: Thoracic;  Laterality: N/A;    Family History: Family History  Problem Relation Age of Onset  . Hypertension Mother   . Heart attack Sister 73    Social History: Social History   Tobacco Use  Smoking Status Never Smoker  Smokeless Tobacco Never Used   Social History   Substance and Sexual Activity  Alcohol Use No   Social History   Substance and Sexual Activity  Drug Use No    Allergies: Allergies  Allergen Reactions  . Lisinopril Cough    Medications: Current Outpatient Medications  Medication Sig Dispense Refill  . acetaminophen (TYLENOL) 325 MG tablet Take 2 tablets (650 mg total) by  mouth every 6 (six) hours as needed for mild pain.    . Alpha-D-Galactosidase (BEANO PO) Take 1 capsule by mouth daily.    Marland Kitchen amLODipine (NORVASC) 10 MG tablet Take 10 mg by mouth daily.    . Ascorbic Acid (VITAMIN C) 500 MG CHEW Chew 1 each by mouth daily.    Marland Kitchen aspirin EC 81 MG tablet Take 81 mg by mouth daily.    . benzonatate (TESSALON) 100 MG capsule Take 1 capsule (100 mg total) by mouth 3 (three) times daily as needed for  cough. 30 capsule 0  . Calcium-Phosphorus-Vitamin D [id number] MG-MG-UNIT CHEW Chew 1 each by mouth 2 (two) times daily.     Marland Kitchen escitalopram (LEXAPRO) 10 MG tablet Take 10 mg by mouth daily.    . fexofenadine (ALLEGRA) 180 MG tablet Take 180 mg by mouth daily.    . folic acid (FOLVITE) 1 MG tablet Take 1 mg by mouth daily.    . hydrochlorothiazide (HYDRODIURIL) 25 MG tablet Take 1 tablet (25 mg total) by mouth daily. 93 tablet 4  . ibuprofen (ADVIL) 200 MG tablet Take 200 mg by mouth every 6 (six) hours as needed.    . meclizine (ANTIVERT) 12.5 MG tablet Take 12.5 mg by mouth every 6 (six) hours as needed.    . Multiple Vitamin (MULTIVITAMIN WITH MINERALS) TABS tablet Take 1 tablet by mouth daily.    . rosuvastatin (CRESTOR) 5 MG tablet TAKE 1 TABLET BY MOUTH EVERY DAY 90 tablet 1  . simethicone (MYLICON) 80 MG chewable tablet Chew 80 mg by mouth every 6 (six) hours as needed for flatulence.    Marland Kitchen guaiFENesin (MUCINEX) 600 MG 12 hr tablet Take by mouth 2 (two) times daily. (Patient not taking: Reported on 12/13/2020)     No current facility-administered medications for this visit.    Review of Systems: GENERAL: negative for malaise, night sweats HEENT: No changes in hearing or vision, no nose bleeds or other nasal problems. NECK: Negative for lumps, goiter, pain and significant neck swelling RESPIRATORY: Negative for cough, wheezing CARDIOVASCULAR: Negative for chest pain, leg swelling, palpitations, orthopnea GI: SEE HPI MUSCULOSKELETAL: Negative for joint pain or swelling, back pain, and muscle pain. SKIN: Negative for lesions, rash PSYCH: Negative for sleep disturbance, mood disorder and recent psychosocial stressors. HEMATOLOGY Negative for prolonged bleeding, bruising easily, and swollen nodes. ENDOCRINE: Negative for cold or heat intolerance, polyuria, polydipsia and goiter. NEURO: negative for tremor, gait imbalance, syncope and seizures. The remainder of the review of systems is  noncontributory.   Physical Exam: BP 132/78 (BP Location: Left Arm, Patient Position: Sitting, Cuff Size: Large)   Pulse 82   Temp 98.5 F (36.9 C) (Oral)   Ht 4\' 11"  (1.499 m)   Wt 127 lb (57.6 kg)   LMP  (LMP Unknown)   BMI 25.65 kg/m  GENERAL: The patient is AO x3, in no acute distress. HEENT: Head is normocephalic and atraumatic. EOMI are intact. Mouth is well hydrated and without lesions. NECK: Supple. No masses LUNGS: Clear to auscultation. No presence of rhonchi/wheezing/rales. Adequate chest expansion HEART: RRR, normal s1 and s2. ABDOMEN: Soft, nontender, no guarding, no peritoneal signs, and nondistended. BS +. No masses. EXTREMITIES: Without any cyanosis, clubbing, rash, lesions or edema. NEUROLOGIC: AOx3, no focal motor deficit. SKIN: no jaundice, no rashes  Imaging/Labs: as above  I personally reviewed and interpreted the available labs, imaging and endoscopic files.  Impression and Plan: Dawn Lin is a 80 y.o. 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  The patient presented a drop in her hemoglobin baseline for unknown reasons.  She has a history of relatively well controlled beta thalassemia for multiple years but given her recent drop in her hemoglobin, there has been a concern for slow gastrointestinal bleeding.  She is still having an adequate ferritin level despite this recent episode.  We will schedule her for an EGD and a colonoscopy to investigate this further, I will also check a repeat CBC today.  As the patient has not presented any more episodes of retrosternal discomfort and she was not able to tolerate the PPI, I advised her to take Pepcid as needed for the episodes of retrosternal discomfort.  Finally, she presented an episode of abdominal pain with other symptoms that were suggestive of biliary colic.  I advised her that when these episodes  happen it is an important consideration to perform a cholecystectomy.  She is not interested in pursuing this at this moment and is stated that if she were to present any more of these episodes she will go to the ER and have an evaluation by a surgeon at that time.  - Can take Pepcid as needed if you  recurrent episode of chest pain/heartburn - If recurrent episodes of RUQ abdominal pain with nausea/vomiting will call for gen surgery referral or will go to ED  - Check CBC -Schedule EGD and colonoscopy  All questions were answered.      Harvel Quale, MD Gastroenterology and Hepatology [facility]

## 2020-12-13 NOTE — H&P (View-Only) (Signed)
Dawn Lin, M.D. Gastroenterology & Hepatology [facility] [address] [place], [place] [address]  Primary Care Physician: [name], MD [place] [address]  I will communicate my assessment and recommendations to the referring MD via EMR.  Problems: 1. Microcytic anemia 2. Beta thalassemia 3. Biliary colic 4. GERD  History of Present Illness: Dawn Lin is a 80 y.o. 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  The patient was last seen on 09/10/2020. At that time, the patient was prescribed omeprazole for episode of retrosternal discomfort.  She took the medication compliantly, however the patient developed flatulence with the use of the medication and stopped taking it after taking it for two weeks. She states has not presented any more chest discomfort since she stopped her medication and feels well.  Patient reports that on she had an episode of RUQ pain with nausea and vomiting, which lasted close to a 2 hours. The episode resolved on its own.  Never had a similar episode in the past but says that the episode was very severe.  Notably, she had a history of cholelithiasis found on CT chest performed on 04/26/2020. The patient denies having any nausea, vomiting, fever, chills, hematochezia, melena, hematemesis, abdominal distention,  diarrhea, jaundice, pruritus or weight loss.  She saw Dr. Federico Flake (hematologist) who is seeing her for management of her beta thalassemia.  The patient was noted to have drop in her Hb to 9.4 on 11/13/2020.  Last iron stores at [facility] showed a ferritin of 86.6, decreased haptoglobin on 11/13/2020.  Last EGD: never Last Colonoscopy:15 years ago, normal per patient, had Cologuard 2 years ago was negative  Past Medical History: Past Medical History:  Diagnosis Date  . Anxiety    . Dyspnea    with exertion  . 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 Family history of adverse reaction to anesthesia    Children - N/V  . History of blood transfusion   . Hyperlipidemia   . Other osteoporosis   . Other thalassemia [facility])     Past Surgical History: Past Surgical History:  Procedure Laterality Date  . COLONOSCOPY    . INGUINAL HERNIA REPAIR Left   . LUMBAR DISC SURGERY  1990's   for severe pain and sciatica  . PARTIAL HYSTERECTOMY  1970's   for bleeding and prolapse  . VIDEO ASSISTED THORACOSCOPY (VATS)/ LOBECTOMY Right 04/20/2017   Procedure: VIDEO ASSISTED THORACOSCOPY (VATS)/RIGHT UPPER LOBECTOMY;  Surgeon: Grace Isaac, MD;  Location: [facility];  Service: Thoracic;  Laterality: Right;  Marland Kitchen VIDEO BRONCHOSCOPY N/A 04/20/2017   Procedure: VIDEO BRONCHOSCOPY;  Surgeon: Grace Isaac, MD;  Location: [facility];  Service: Thoracic;  Laterality: N/A;    Family History: Family History  Problem Relation Age of Onset  . Hypertension Mother   . Heart attack Sister 45    Social History: Social History   Tobacco Use  Smoking Status Never Smoker  Smokeless Tobacco Never Used   Social History   Substance and Sexual Activity  Alcohol Use No   Social History   Substance and Sexual Activity  Drug Use No    Allergies: Allergies  Allergen Reactions  . Lisinopril Cough    Medications: Current Outpatient Medications  Medication Sig Dispense Refill  . acetaminophen (TYLENOL) 325 MG tablet Take 2 tablets (650 mg total) by  mouth every 6 (six) hours as needed for mild pain.    . Alpha-D-Galactosidase (BEANO PO) Take 1 capsule by mouth daily.    Marland Kitchen amLODipine (NORVASC) 10 MG tablet Take 10 mg by mouth daily.    . Ascorbic Acid (VITAMIN C) 500 MG CHEW Chew 1 each by mouth daily.    Marland Kitchen aspirin EC 81 MG tablet Take 81 mg by mouth daily.    . benzonatate (TESSALON) 100 MG capsule Take 1 capsule (100 mg total) by mouth 3 (three) times daily as needed for  cough. 30 capsule 0  . Calcium-Phosphorus-Vitamin D [id number] MG-MG-UNIT CHEW Chew 1 each by mouth 2 (two) times daily.     Marland Kitchen escitalopram (LEXAPRO) 10 MG tablet Take 10 mg by mouth daily.    . fexofenadine (ALLEGRA) 180 MG tablet Take 180 mg by mouth daily.    . folic acid (FOLVITE) 1 MG tablet Take 1 mg by mouth daily.    . hydrochlorothiazide (HYDRODIURIL) 25 MG tablet Take 1 tablet (25 mg total) by mouth daily. 93 tablet 4  . ibuprofen (ADVIL) 200 MG tablet Take 200 mg by mouth every 6 (six) hours as needed.    . meclizine (ANTIVERT) 12.5 MG tablet Take 12.5 mg by mouth every 6 (six) hours as needed.    . Multiple Vitamin (MULTIVITAMIN WITH MINERALS) TABS tablet Take 1 tablet by mouth daily.    . rosuvastatin (CRESTOR) 5 MG tablet TAKE 1 TABLET BY MOUTH EVERY DAY 90 tablet 1  . simethicone (MYLICON) 80 MG chewable tablet Chew 80 mg by mouth every 6 (six) hours as needed for flatulence.    Marland Kitchen guaiFENesin (MUCINEX) 600 MG 12 hr tablet Take by mouth 2 (two) times daily. (Patient not taking: Reported on 12/13/2020)     No current facility-administered medications for this visit.    Review of Systems: GENERAL: negative for malaise, night sweats HEENT: No changes in hearing or vision, no nose bleeds or other nasal problems. NECK: Negative for lumps, goiter, pain and significant neck swelling RESPIRATORY: Negative for cough, wheezing CARDIOVASCULAR: Negative for chest pain, leg swelling, palpitations, orthopnea GI: SEE HPI MUSCULOSKELETAL: Negative for joint pain or swelling, back pain, and muscle pain. SKIN: Negative for lesions, rash PSYCH: Negative for sleep disturbance, mood disorder and recent psychosocial stressors. HEMATOLOGY Negative for prolonged bleeding, bruising easily, and swollen nodes. ENDOCRINE: Negative for cold or heat intolerance, polyuria, polydipsia and goiter. NEURO: negative for tremor, gait imbalance, syncope and seizures. The remainder of the review of systems is  noncontributory.   Physical Exam: BP 132/78 (BP Location: Left Arm, Patient Position: Sitting, Cuff Size: Large)   Pulse 82   Temp 98.5 F (36.9 C) (Oral)   Ht 4\' 11"  (1.499 m)   Wt 127 lb (57.6 kg)   LMP  (LMP Unknown)   BMI 25.65 kg/m  GENERAL: The patient is AO x3, in no acute distress. HEENT: Head is normocephalic and atraumatic. EOMI are intact. Mouth is well hydrated and without lesions. NECK: Supple. No masses LUNGS: Clear to auscultation. No presence of rhonchi/wheezing/rales. Adequate chest expansion HEART: RRR, normal s1 and s2. ABDOMEN: Soft, nontender, no guarding, no peritoneal signs, and nondistended. BS +. No masses. EXTREMITIES: Without any cyanosis, clubbing, rash, lesions or edema. NEUROLOGIC: AOx3, no focal motor deficit. SKIN: no jaundice, no rashes  Imaging/Labs: as above  I personally reviewed and interpreted the available labs, imaging and endoscopic files.  Impression and Plan: Dawn Lin is a 80 y.o. 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  The patient presented a drop in her hemoglobin baseline for unknown reasons.  She has a history of relatively well controlled beta thalassemia for multiple years but given her recent drop in her hemoglobin, there has been a concern for slow gastrointestinal bleeding.  She is still having an adequate ferritin level despite this recent episode.  We will schedule her for an EGD and a colonoscopy to investigate this further, I will also check a repeat CBC today.  As the patient has not presented any more episodes of retrosternal discomfort and she was not able to tolerate the PPI, I advised her to take Pepcid as needed for the episodes of retrosternal discomfort.  Finally, she presented an episode of abdominal pain with other symptoms that were suggestive of biliary colic.  I advised her that when these episodes  happen it is an important consideration to perform a cholecystectomy.  She is not interested in pursuing this at this moment and is stated that if she were to present any more of these episodes she will go to the ER and have an evaluation by a surgeon at that time.  - Can take Pepcid as needed if you  recurrent episode of chest pain/heartburn - If recurrent episodes of RUQ abdominal pain with nausea/vomiting will call for gen surgery referral or will go to ED  - Check CBC -Schedule EGD and colonoscopy  All questions were answered.      Harvel Quale, MD Gastroenterology and Hepatology [facility]

## 2020-12-13 NOTE — Patient Instructions (Addendum)
Can take Pepcid as needed if you have any recurrent episode of chest pain/heartburn If you have any more episodes of abdominal pain with nausea/vomiting call us or go to the ER for furhter evaluation Perform blood workup Schedule EGD and colonoscopy

## 2020-12-13 NOTE — Telephone Encounter (Signed)
Dawn Lin, CMA  

## 2020-12-14 LAB — CBC WITH DIFFERENTIAL/PLATELET
Absolute Monocytes: 646 cells/uL (ref 200–950)
Basophils Absolute: 91 cells/uL (ref 0–200)
Basophils Relative: 1.2 %
Eosinophils Absolute: 99 cells/uL (ref 15–500)
Eosinophils Relative: 1.3 %
HCT: 25.8 % — ABNORMAL LOW (ref 35.0–45.0)
Hemoglobin: 9.1 g/dL — ABNORMAL LOW (ref 11.7–15.5)
Lymphs Abs: 1976 cells/uL (ref 850–3900)
MCH: 27.4 pg (ref 27.0–33.0)
MCHC: 35.3 g/dL (ref 32.0–36.0)
MCV: 77.7 fL — ABNORMAL LOW (ref 80.0–100.0)
MPV: 12.3 fL (ref 7.5–12.5)
Monocytes Relative: 8.5 %
Neutro Abs: 4788 cells/uL (ref 1500–7800)
Neutrophils Relative %: 63 %
Platelets: 398 10*3/uL (ref 140–400)
RBC: 3.32 10*6/uL — ABNORMAL LOW (ref 3.80–5.10)
RDW: 23 % — ABNORMAL HIGH (ref 11.0–15.0)
Total Lymphocyte: 26 %
WBC: 7.6 10*3/uL (ref 3.8–10.8)

## 2020-12-14 LAB — CBC MORPHOLOGY

## 2020-12-25 NOTE — Patient Instructions (Signed)
Your procedure is scheduled on: 12/28/2020  Report to [facility] entrance at 8:30    AM.  Call this number if you have problems the morning of surgery: [phone]   Remember:              Follow Directions on the letter you received from Your Physician's office regarding the Bowel Prep              No Smoking the day of Procedure :   Take these medicines the morning of surgery with A SIP OF WATER: Amlodipine, Lexapro and antivert if needed   Do not wear jewelry, make-up or nail polish.    Do not bring valuables to the hospital.  Contacts, dentures or bridgework may not be worn into surgery.  .   Patients discharged the day of surgery will not be allowed to drive home.     Colonoscopy, Adult, Care After This sheet gives you information about how to care for yourself after your procedure. Your health care provider may also give you more specific instructions. If you have problems or questions, contact your health care provider. What can I expect after the procedure? After the procedure, it is common to have:  A small amount of blood in your stool for 24 hours after the procedure.  Some gas.  Mild abdominal cramping or bloating.  Follow these instructions at home: General instructions   For the first 24 hours after the procedure: ? Do not drive or use machinery. ? Do not sign important documents. ? Do not drink alcohol. ? Do your regular daily activities at a slower pace than normal. ? Eat soft, easy-to-digest foods. ? Rest often.  Take over-the-counter or prescription medicines only as told by your health care provider.  It is up to you to get the results of your procedure. Ask your health care provider, or the department performing the procedure, when your results will be ready. Relieving cramping and bloating  Try walking around when you have cramps or feel bloated.  Apply heat to your abdomen as told by your health care provider. Use a heat source that  your health care provider recommends, such as a moist heat pack or a heating pad. ? Place a towel between your skin and the heat source. ? Leave the heat on for 20-30 minutes. ? Remove the heat if your skin turns bright red. This is especially important if you are unable to feel pain, heat, or cold. You may have a greater risk of getting burned. Eating and drinking  Drink enough fluid to keep your urine clear or pale yellow.  Resume your normal diet as instructed by your health care provider. Avoid heavy or fried foods that are hard to digest.  Avoid drinking alcohol for as long as instructed by your health care provider. Contact a health care provider if:  You have blood in your stool 2-3 days after the procedure. Get help right away if:  You have more than a small spotting of blood in your stool.  You pass large blood clots in your stool.  Your abdomen is swollen.  You have nausea or vomiting.  You have a fever.  You have increasing abdominal pain that is not relieved with medicine. This information is not intended to replace advice given to you by your health care provider. Make sure you discuss any questions you have with your health care provider. Document Released: 05/13/2004 Document Revised: 06/23/2016 Document Reviewed: 12/11/2015 Elsevier Interactive  Patient Education  2018 [place] [facility], Adult, Care After This sheet gives you information about how to care for yourself after your procedure. Your health care provider may also give you more specific instructions. If you have problems or questions, contact your health care provider. What can I expect after the procedure? After the procedure, it is common to have:  A sore throat.  Mild stomach pain or discomfort.  Bloating.  Nausea. Follow these instructions at home:  Follow instructions from your health care provider about what to eat or drink after your procedure.  Return to your normal  activities as told by your health care provider. Ask your health care provider what activities are safe for you.  Take over-the-counter and prescription medicines only as told by your health care provider.  If you were given a sedative during the procedure, it can affect you for several hours. Do not drive or operate machinery until your health care provider says that it is safe.  Keep all follow-up visits as told by your health care provider. This is important.   Contact a health care provider if you have:  A sore throat that lasts longer than one day.  Trouble swallowing. Get help right away if:  You vomit blood or your vomit looks like coffee grounds.  You have: ? A fever. ? Bloody, black, or tarry stools. ? A severe sore throat or you cannot swallow. ? Difficulty breathing. ? Severe pain in your chest or abdomen. Summary  After the procedure, it is common to have a sore throat, mild stomach discomfort, bloating, and nausea.  If you were given a sedative during the procedure, it can affect you for several hours. Do not drive or operate machinery until your health care provider says that it is safe.  Follow instructions from your health care provider about what to eat or drink after your procedure.  Return to your normal activities as told by your health care provider. This information is not intended to replace advice given to you by your health care provider. Make sure you discuss any questions you have with your health care provider. Document Revised: 09/27/2019 Document Reviewed: 03/01/2018 Elsevier Patient Education  2021 [employer].

## 2020-12-26 ENCOUNTER — Encounter (HOSPITAL_COMMUNITY): Payer: Self-pay

## 2020-12-26 ENCOUNTER — Other Ambulatory Visit: Payer: Self-pay

## 2020-12-26 ENCOUNTER — Other Ambulatory Visit (HOSPITAL_COMMUNITY)
Admission: RE | Admit: 2020-12-26 | Discharge: 2020-12-26 | Disposition: A | Discharge status: Home / Self Care | Payer: Medicare HMO | Attending: Gastroenterology | Admitting: Gastroenterology

## 2020-12-26 ENCOUNTER — Other Ambulatory Visit (INDEPENDENT_AMBULATORY_CARE_PROVIDER_SITE_OTHER): Payer: Self-pay | Admitting: Gastroenterology

## 2020-12-26 ENCOUNTER — Encounter (HOSPITAL_COMMUNITY)
Admission: RE | Admit: 2020-12-26 | Discharge: 2020-12-26 | Disposition: A | Discharge status: Home / Self Care | Payer: Medicare HMO | Source: Ambulatory Visit | Attending: Gastroenterology | Admitting: Gastroenterology

## 2020-12-26 ENCOUNTER — Other Ambulatory Visit (HOSPITAL_COMMUNITY)
Admission: RE | Admit: 2020-12-26 | Discharge: 2020-12-26 | Disposition: A | Discharge status: Home / Self Care | Payer: Medicare HMO | Source: Ambulatory Visit | Attending: Gastroenterology | Admitting: Gastroenterology

## 2020-12-26 DIAGNOSIS — I10 Essential (primary) hypertension: Secondary | ICD-10-CM | POA: Insufficient documentation

## 2020-12-26 DIAGNOSIS — Z01818 Encounter for other preprocedural examination: Secondary | ICD-10-CM | POA: Diagnosis not present

## 2020-12-26 DIAGNOSIS — E875 Hyperkalemia: Secondary | ICD-10-CM

## 2020-12-26 DIAGNOSIS — Z20822 Contact with and (suspected) exposure to covid-19: Secondary | ICD-10-CM | POA: Diagnosis not present

## 2020-12-26 LAB — POTASSIUM: Potassium: 3.3 mmol/L — ABNORMAL LOW (ref 3.5–5.1)

## 2020-12-26 LAB — BASIC METABOLIC PANEL
Anion gap: 9 (ref 5–15)
BUN: 17 mg/dL (ref 8–23)
CO2: 27 mmol/L (ref 22–32)
Calcium: 9 mg/dL (ref 8.9–10.3)
Chloride: 99 mmol/L (ref 98–111)
Creatinine, Ser: 0.53 mg/dL (ref 0.44–1.00)
GFR, Estimated: >60 mL/min (ref >60)
Glucose, Bld: 89 mg/dL (ref 70–99)
Potassium: 5.8 mmol/L — ABNORMAL HIGH (ref 3.5–5.1)
Sodium: 135 mmol/L (ref 135–145)

## 2020-12-26 LAB — SARS CORONAVIRUS 2 (TAT 6-24 HRS): SARS Coronavirus 2: NEGATIVE

## 2020-12-27 ENCOUNTER — Ambulatory Visit (HOSPITAL_COMMUNITY): Payer: Medicare HMO

## 2020-12-28 ENCOUNTER — Encounter (HOSPITAL_COMMUNITY): Payer: Self-pay | Admitting: Gastroenterology

## 2020-12-28 ENCOUNTER — Ambulatory Visit (HOSPITAL_COMMUNITY): Payer: Medicare HMO | Admitting: Anesthesiology

## 2020-12-28 ENCOUNTER — Encounter (HOSPITAL_COMMUNITY)
Admission: RE | Disposition: A | Discharge status: Home / Self Care | Payer: Self-pay | Source: Home / Self Care | Attending: Gastroenterology

## 2020-12-28 ENCOUNTER — Ambulatory Visit (HOSPITAL_COMMUNITY)
Admission: RE | Admit: 2020-12-28 | Discharge: 2020-12-28 | Disposition: A | Discharge status: Home / Self Care | Payer: Medicare HMO | Attending: Gastroenterology | Admitting: Gastroenterology

## 2020-12-28 ENCOUNTER — Other Ambulatory Visit: Payer: Self-pay

## 2020-12-28 DIAGNOSIS — Z79899 Other long term (current) drug therapy: Secondary | ICD-10-CM | POA: Insufficient documentation

## 2020-12-28 DIAGNOSIS — I10 Essential (primary) hypertension: Secondary | ICD-10-CM | POA: Diagnosis not present

## 2020-12-28 DIAGNOSIS — D509 Iron deficiency anemia, unspecified: Secondary | ICD-10-CM | POA: Insufficient documentation

## 2020-12-28 DIAGNOSIS — D561 Beta thalassemia: Secondary | ICD-10-CM | POA: Diagnosis not present

## 2020-12-28 DIAGNOSIS — E785 Hyperlipidemia, unspecified: Secondary | ICD-10-CM | POA: Diagnosis not present

## 2020-12-28 DIAGNOSIS — K2970 Gastritis, unspecified, without bleeding: Secondary | ICD-10-CM

## 2020-12-28 DIAGNOSIS — K3189 Other diseases of stomach and duodenum: Secondary | ICD-10-CM | POA: Diagnosis not present

## 2020-12-28 DIAGNOSIS — K317 Polyp of stomach and duodenum: Secondary | ICD-10-CM | POA: Insufficient documentation

## 2020-12-28 DIAGNOSIS — Z888 Allergy status to other drugs, medicaments and biological substances status: Secondary | ICD-10-CM | POA: Diagnosis not present

## 2020-12-28 DIAGNOSIS — Z902 Acquired absence of lung [part of]: Secondary | ICD-10-CM | POA: Diagnosis not present

## 2020-12-28 DIAGNOSIS — Z7982 Long term (current) use of aspirin: Secondary | ICD-10-CM | POA: Diagnosis not present

## 2020-12-28 DIAGNOSIS — Z8249 Family history of ischemic heart disease and other diseases of the circulatory system: Secondary | ICD-10-CM | POA: Insufficient documentation

## 2020-12-28 DIAGNOSIS — Z85118 Personal history of other malignant neoplasm of bronchus and lung: Secondary | ICD-10-CM | POA: Insufficient documentation

## 2020-12-28 DIAGNOSIS — I2510 Atherosclerotic heart disease of native coronary artery without angina pectoris: Secondary | ICD-10-CM | POA: Insufficient documentation

## 2020-12-28 DIAGNOSIS — D649 Anemia, unspecified: Secondary | ICD-10-CM

## 2020-12-28 DIAGNOSIS — F419 Anxiety disorder, unspecified: Secondary | ICD-10-CM | POA: Diagnosis not present

## 2020-12-28 DIAGNOSIS — Z1211 Encounter for screening for malignant neoplasm of colon: Secondary | ICD-10-CM | POA: Diagnosis not present

## 2020-12-28 DIAGNOSIS — R69 Illness, unspecified: Secondary | ICD-10-CM | POA: Diagnosis not present

## 2020-12-28 HISTORY — PX: ESOPHAGOGASTRODUODENOSCOPY (EGD) WITH PROPOFOL: SHX5813

## 2020-12-28 HISTORY — PX: BIOPSY: SHX5522

## 2020-12-28 HISTORY — PX: COLONOSCOPY WITH PROPOFOL: SHX5780

## 2020-12-28 SURGERY — COLONOSCOPY WITH PROPOFOL
Anesthesia: General

## 2020-12-28 MED ORDER — PROPOFOL 10 MG/ML IV BOLUS
INTRAVENOUS | Status: AC
  Filled 2020-12-28: qty 80

## 2020-12-28 MED ORDER — LIDOCAINE VISCOUS HCL 2 % MT SOLN
OROMUCOSAL | Status: AC
  Administered 2020-12-28: 15 mL via OROMUCOSAL
  Filled 2020-12-28: qty 15

## 2020-12-28 MED ORDER — LACTATED RINGERS IV SOLN: INTRAVENOUS | Status: DC

## 2020-12-28 MED ORDER — LIDOCAINE VISCOUS HCL 2 % MT SOLN: 15.0000 mL | Freq: Once | OROMUCOSAL | Status: AC

## 2020-12-28 MED ORDER — PROPOFOL 500 MG/50ML IV EMUL
INTRAVENOUS | Status: DC | PRN
  Administered 2020-12-28: 150 ug/kg/min via INTRAVENOUS

## 2020-12-28 NOTE — Discharge Instructions (Signed)
You are being discharged to home.  Resume your previous diet.  We are waiting for your pathology results.  Your physician does not recommend any repeat screening colonoscopies given your age.         Upper Endoscopy, Adult, Care After This sheet gives you information about how to care for yourself after your procedure. Your health care provider may also give you more specific instructions. If you have problems or questions, contact your health care provider. What can I expect after the procedure? After the procedure, it is common to have:  A sore throat.  Mild stomach pain or discomfort.  Bloating.  Nausea. Follow these instructions at home:  Follow instructions from your health care provider about what to eat or drink after your procedure.  Return to your normal activities as told by your health care provider. Ask your health care provider what activities are safe for you.  Take over-the-counter and prescription medicines only as told by your health care provider.  If you were given a sedative during the procedure, it can affect you for several hours. Do not drive or operate machinery until your health care provider says that it is safe.  Keep all follow-up visits as told by your health care provider. This is important.   Contact a health care provider if you have:  A sore throat that lasts longer than one day.  Trouble swallowing. Get help right away if:  You vomit blood or your vomit looks like coffee grounds.  You have: ? A fever. ? Bloody, black, or tarry stools. ? A severe sore throat or you cannot swallow. ? Difficulty breathing. ? Severe pain in your chest or abdomen. Summary  After the procedure, it is common to have a sore throat, mild stomach discomfort, bloating, and nausea.  If you were given a sedative during the procedure, it can affect you for several hours. Do not drive or operate machinery until your health care provider says that it is  safe.  Follow instructions from your health care provider about what to eat or drink after your procedure.  Return to your normal activities as told by your health care provider. This information is not intended to replace advice given to you by your health care provider. Make sure you discuss any questions you have with your health care provider. Document Revised: 09/27/2019 Document Reviewed: 03/01/2018 Elsevier Patient Education  2021 [place].      Colonoscopy, Adult, Care After This sheet gives you information about how to care for yourself after your procedure. Your doctor may also give you more specific instructions. If you have problems or questions, call your doctor. What can I expect after the procedure? After the procedure, it is common to have:  A small amount of blood in your poop (stool) for 24 hours.  Some gas.  Mild cramping or bloating in your belly (abdomen). Follow these instructions at home: Eating and drinking  Drink enough fluid to keep your pee (urine) pale yellow.  Follow instructions from your doctor about what you cannot eat or drink.  Return to your normal diet as told by your doctor. Avoid heavy or fried foods that are hard to digest.   Activity  Rest as told by your doctor.  Do not sit for a long time without moving. Get up to take short walks every 1-2 hours. This is important. Ask for help if you feel weak or unsteady.  Return to your normal activities as told by your doctor.  Ask your doctor what activities are safe for you. To help cramping and bloating:  Try walking around.  Put heat on your belly as told by your doctor. Use the heat source that your doctor recommends, such as a moist heat pack or a heating pad. ? Put a towel between your skin and the heat source. ? Leave the heat on for 20-30 minutes. ? Remove the heat if your skin turns bright red. This is very important if you are unable to feel pain, heat, or cold. You may have a  greater risk of getting burned.   General instructions  If you were given a medicine to help you relax (sedative) during your procedure, it can affect you for many hours. Do not drive or use machinery until your doctor says that it is safe.  For the first 24 hours after the procedure: ? Do not sign important documents. ? Do not drink alcohol. ? Do your daily activities more slowly than normal. ? Eat foods that are soft and easy to digest.  Take over-the-counter or prescription medicines only as told by your doctor.  Keep all follow-up visits as told by your doctor. This is important. Contact a doctor if:  You have blood in your poop 2-3 days after the procedure. Get help right away if:  You have more than a small amount of blood in your poop.  You see large clumps of tissue (blood clots) in your poop.  Your belly is swollen.  You feel like you may vomit (nauseous).  You vomit.  You have a fever.  You have belly pain that gets worse, and medicine does not help your pain. Summary  After the procedure, it is common to have a small amount of blood in your poop. You may also have mild cramping and bloating in your belly.  If you were given a medicine to help you relax (sedative) during your procedure, it can affect you for many hours. Do not drive or use machinery until your doctor says that it is safe.  Get help right away if you have a lot of blood in your poop, feel like you may vomit, have a fever, or have more belly pain. This information is not intended to replace advice given to you by your health care provider. Make sure you discuss any questions you have with your health care provider. Document Revised: 08/05/2019 Document Reviewed: 04/25/2019 Elsevier Patient Education  2021 [place].      Gastritis, Adult  Gastritis is swelling (inflammation) of the stomach. Gastritis can develop quickly (acute). It can also develop slowly over time (chronic). It is  important to get help for this condition. If you do not get help, your stomach can bleed, and you can get sores (ulcers) in your stomach. What are the causes? This condition may be caused by:  Germs that get to your stomach.  Drinking too much alcohol.  Medicines you are taking.  Too much acid in the stomach.  A disease of the intestines or stomach.  Stress.  An allergic reaction.  Crohn's disease.  Some cancer treatments (radiation). Sometimes the cause of this condition is not known. What are the signs or symptoms? Symptoms of this condition include:  Pain in your stomach.  A burning feeling in your stomach.  Feeling sick to your stomach (nauseous).  Throwing up (vomiting).  Feeling too full after you eat.  Weight loss.  Bad breath.  Throwing up blood.  Blood in your poop (stool).  How is this diagnosed? This condition may be diagnosed with:  Your medical history and symptoms.  A physical exam.  Tests. These can include: ? Blood tests. ? Stool tests. ? A procedure to look inside your stomach (upper endoscopy). ? A test in which a sample of tissue is taken for testing (biopsy). How is this treated? Treatment for this condition depends on what caused it. You may be given:  Antibiotic medicine, if your condition was caused by germs.  H2 blockers and similar medicines, if your condition was caused by too much acid. Follow these instructions at home: Medicines  Take over-the-counter and prescription medicines only as told by your doctor.  If you were prescribed an antibiotic medicine, take it as told by your doctor. Do not stop taking it even if you start to feel better. Eating and drinking  Eat small meals often, instead of large meals.  Avoid foods and drinks that make your symptoms worse.  Drink enough fluid to keep your pee (urine) pale yellow.   Alcohol use  Do not drink alcohol if: ? Your doctor tells you not to drink. ? You are pregnant,  may be pregnant, or are planning to become pregnant.  If you drink alcohol: ? Limit your use to:  0-1 drink a day for women.  0-2 drinks a day for men. ? Be aware of how much alcohol is in your drink. In the [place] one drink equals one 12 oz bottle of beer (355 mL), one 5 oz glass of wine (148 mL), or one 1 oz glass of hard liquor (44 mL). General instructions  Talk with your doctor about ways to manage stress. You can exercise or do deep breathing, meditation, or yoga.  Do not smoke or use products that have nicotine or tobacco. If you need help quitting, ask your doctor.  Keep all follow-up visits as told by your doctor. This is important. Contact a doctor if:  Your symptoms get worse.  Your symptoms go away and then come back. Get help right away if:  You throw up blood or something that looks like coffee grounds.  You have black or dark red poop.  You throw up any time you try to drink fluids.  Your stomach pain gets worse.  You have a fever.  You do not feel better after one week. Summary  Gastritis is swelling (inflammation) of the stomach.  You must get help for this condition. If you do not get help, your stomach can bleed, and you can get sores (ulcers).  This condition is diagnosed with medical history, physical exam, or tests.  You can be treated with medicines for germs or medicines to block too much acid in your stomach. This information is not intended to replace advice given to you by your health care provider. Make sure you discuss any questions you have with your health care provider. Document Revised: 02/16/2018 Document Reviewed: 02/16/2018 Elsevier Patient Education  2021 [place].     Diverticulosis  Diverticulosis is a condition that develops when small pouches (diverticula) form in the wall of the large intestine (colon). The colon is where water is absorbed and stool (feces) is formed. The pouches form when the inside layer of the colon  pushes through weak spots in the outer layers of the colon. You may have a few pouches or many of them. The pouches usually do not cause problems unless they become inflamed or infected. When this happens, the condition is called diverticulitis. What  are the causes? The cause of this condition is not known. What increases the risk? The following factors may make you more likely to develop this condition:  Being older than age 50. Your risk for this condition increases with age. Diverticulosis is rare among people younger than age 71. By age 5, many people have it.  Eating a low-fiber diet.  Having frequent constipation.  Being overweight.  Not getting enough exercise.  Smoking.  Taking over-the-counter pain medicines, like aspirin and ibuprofen.  Having a family history of diverticulosis. What are the signs or symptoms? In most people, there are no symptoms of this condition. If you do have symptoms, they may include:  Bloating.  Cramps in the abdomen.  Constipation or diarrhea.  Pain in the lower left side of the abdomen. How is this diagnosed? Because diverticulosis usually has no symptoms, it is most often diagnosed during an exam for other colon problems. The condition may be diagnosed by:  Using a flexible scope to examine the colon (colonoscopy).  Taking an X-ray of the colon after dye has been put into the colon (barium enema).  Having a CT scan. How is this treated? You may not need treatment for this condition. Your health care provider may recommend treatment to prevent problems. You may need treatment if you have symptoms or if you previously had diverticulitis. Treatment may include:  Eating a high-fiber diet.  Taking a fiber supplement.  Taking a live bacteria supplement (probiotic).  Taking medicine to relax your colon.   Follow these instructions at home: Medicines  Take over-the-counter and prescription medicines only as told by your health care  provider.  If told by your health care provider, take a fiber supplement or probiotic. Constipation prevention Your condition may cause constipation. To prevent or treat constipation, you may need to:  Drink enough fluid to keep your urine pale yellow.  Take over-the-counter or prescription medicines.  Eat foods that are high in fiber, such as beans, whole grains, and fresh fruits and vegetables.  Limit foods that are high in fat and processed sugars, such as fried or sweet foods.   General instructions  Try not to strain when you have a bowel movement.  Keep all follow-up visits as told by your health care provider. This is important. Contact a health care provider if you:  Have pain in your abdomen.  Have bloating.  Have cramps.  Have not had a bowel movement in 3 days. Get help right away if:  Your pain gets worse.  Your bloating becomes very bad.  You have a fever or chills, and your symptoms suddenly get worse.  You vomit.  You have bowel movements that are bloody or black.  You have bleeding from your rectum. Summary  Diverticulosis is a condition that develops when small pouches (diverticula) form in the wall of the large intestine (colon).  You may have a few pouches or many of them.  This condition is most often diagnosed during an exam for other colon problems.  Treatment may include increasing the fiber in your diet, taking supplements, or taking medicines. This information is not intended to replace advice given to you by your health care provider. Make sure you discuss any questions you have with your health care provider. Document Revised: 04/28/2019 Document Reviewed: 04/28/2019 Elsevier Patient Education  2021 [place] After This sheet gives you information about how to care for yourself after your procedure. Your  health care provider may also give you more specific instructions. If you have problems  or questions, contact your health care provider. What can I expect after the procedure? After the procedure, it is common to have:  Tiredness.  Forgetfulness about what happened after the procedure.  Impaired judgment for important decisions.  Nausea or vomiting.  Some difficulty with balance. Follow these instructions at home: For the time period you were told by your health care provider:  Rest as needed.  Do not participate in activities where you could fall or become injured.  Do not drive or use machinery.  Do not drink alcohol.  Do not take sleeping pills or medicines that cause drowsiness.  Do not make important decisions or sign legal documents.  Do not take care of children on your own.      Eating and drinking  Follow the diet that is recommended by your health care provider.  Drink enough fluid to keep your urine pale yellow.  If you vomit: ? Drink water, juice, or soup when you can drink without vomiting. ? Make sure you have little or no nausea before eating solid foods. General instructions  Have a responsible adult stay with you for the time you are told. It is important to have someone help care for you until you are awake and alert.  Take over-the-counter and prescription medicines only as told by your health care provider.  If you have sleep apnea, surgery and certain medicines can increase your risk for breathing problems. Follow instructions from your health care provider about wearing your sleep device: ? Anytime you are sleeping, including during daytime naps. ? While taking prescription pain medicines, sleeping medicines, or medicines that make you drowsy.  Avoid smoking.  Keep all follow-up visits as told by your health care provider. This is important. Contact a health care provider if:  You keep feeling nauseous or you keep vomiting.  You feel light-headed.  You are still sleepy or having trouble with balance after 24 hours.  You  develop a rash.  You have a fever.  You have redness or swelling around the IV site. Get help right away if:  You have trouble breathing.  You have new-onset confusion at home. Summary  For several hours after your procedure, you may feel tired. You may also be forgetful and have poor judgment.  Have a responsible adult stay with you for the time you are told. It is important to have someone help care for you until you are awake and alert.  Rest as told. Do not drive or operate machinery. Do not drink alcohol or take sleeping pills.  Get help right away if you have trouble breathing, or if you suddenly become confused. This information is not intended to replace advice given to you by your health care provider. Make sure you discuss any questions you have with your health care provider. Document Revised: 06/14/2020 Document Reviewed: 09/01/2019 Elsevier Patient Education  2021 [employer].

## 2020-12-28 NOTE — Op Note (Signed)
[facility] Patient Name: Dawn Lin Procedure Date: 12/28/2020 9:47 AM MRN: [id number] Date of Birth: 1941/08/06 Attending MD: [name] ,  CSN: [id number] Age: 80 Admit Type: Outpatient Procedure:                Colonoscopy Indications:              Anemia Providers:                [name], Janeece Riggers, RN, Raphael Gibney,                            Technician Referring MD:              Medicines:                Monitored Anesthesia Care Complications:            No immediate complications. Estimated Blood Loss:     Estimated blood loss: none. Procedure:                Pre-Anesthesia Assessment:                           - Prior to the procedure, a History and Physical                            was performed, and patient medications, allergies                            and sensitivities were reviewed. The patient's                            tolerance of previous anesthesia was reviewed.                           - The risks and benefits of the procedure and the                            sedation options and risks were discussed with the                            patient. All questions were answered and informed                            consent was obtained.                           - ASA Grade Assessment: II - A patient with mild                            systemic disease.                           After obtaining informed consent, the colonoscope                            was passed under direct vision. Throughout the  procedure, the patient's blood pressure, pulse, and                            oxygen saturations were monitored continuously. The                            [id number]-H190DL ([id number]) was introduced through the                            anus and advanced to the the terminal ileum. The                            colonoscopy was performed without difficulty. The                            patient tolerated  the procedure well. The quality                            of the bowel preparation was good. Scope withdrawal                            time was 13 minutes. Scope In: 9:49:38 AM Scope Out: 10:14:35 AM Scope Withdrawal Time: 0 hours 16 minutes 37 seconds  Total Procedure Duration: 0 hours 24 minutes 57 seconds  Findings:      The perianal and digital rectal examinations were normal.      The terminal ileum appeared normal.      The colon (entire examined portion) appeared normal.      The retroflexed view of the distal rectum and anal verge was normal and       showed no anal or rectal abnormalities. Impression:               - The examined portion of the ileum was normal.                           - The entire examined colon is normal.                           - The distal rectum and anal verge are normal on                            retroflexion view.                           - No specimens collected. Moderate Sedation:      Per Anesthesia Care Recommendation:           - Discharge patient to home (ambulatory).                           - Resume previous diet.                           - Repeat colonoscopy is not recommended due to  current age (30 years or older) for screening                            purposes. Procedure Code(s):        --- Professional ---                           [phone], Colonoscopy, flexible; diagnostic, including                            collection of specimen(s) by brushing or washing,                            when performed (separate procedure) Diagnosis Code(s):        --- Professional ---                           D64.9, Anemia, unspecified CPT copyright 2019 American Medical Association. All rights reserved. The codes documented in this report are preliminary and upon coder review may  be revised to meet current compliance requirements. [name], MD [name],  12/28/2020 10:22:04 AM This report has  been signed electronically. Number of Addenda: 0

## 2020-12-28 NOTE — Op Note (Signed)
[facility] Patient Name: Dawn Lin Procedure Date: 12/28/2020 9:21 AM MRN: [id number] Date of Birth: Apr 08, 1941 Attending MD: [name] ,  CSN: [id number] Age: 80 Admit Type: Outpatient Procedure:                Upper GI endoscopy Indications:              Anemia Providers:                [name], Janeece Riggers, RN, Raphael Gibney,                            Technician Referring MD:              Medicines:                Monitored Anesthesia Care Complications:            No immediate complications. Estimated Blood Loss:     Estimated blood loss: none. Procedure:                Pre-Anesthesia Assessment:                           - Prior to the procedure, a History and Physical                            was performed, and patient medications, allergies                            and sensitivities were reviewed. The patient's                            tolerance of previous anesthesia was reviewed.                           - The risks and benefits of the procedure and the                            sedation options and risks were discussed with the                            patient. All questions were answered and informed                            consent was obtained.                           - ASA Grade Assessment: II - A patient with mild                            systemic disease.                           After obtaining informed consent, the endoscope was                            passed under direct vision. Throughout the  procedure, the patient's blood pressure, pulse, and                            oxygen saturations were monitored continuously. The                            [id number]-[id number] ([id number]) scope was introduced through the                            mouth, and advanced to the second part of duodenum.                            The upper GI endoscopy was accomplished without                             difficulty. The patient tolerated the procedure                            well. Scope In: 9:33:17 AM Scope Out: 9:42:43 AM Total Procedure Duration: 0 hours 9 minutes 26 seconds  Findings:      The examined esophagus was normal.      A few small sessile polyps with no stigmata of recent bleeding were       found in the gastric fundus, had fundic gland polyp appearance.      Localized mild inflammation characterized by erosions and erythema was       found in the gastric antrum. Biopsies were taken with a cold forceps for       Helicobacter pylori testing.      The examined duodenum was normal. Biopsies for histology were taken with       a cold forceps for evaluation of celiac disease. Impression:               - Normal esophagus.                           - A few gastric polyps.                           - Gastritis. Biopsied.                           - Normal examined duodenum. Biopsied. Moderate Sedation:      Per Anesthesia Care Recommendation:           - Discharge patient to home (ambulatory).                           - Resume previous diet.                           - Await pathology results. Procedure Code(s):        --- Professional ---                           [phone], Esophagogastroduodenoscopy, flexible,  transoral; with biopsy, single or multiple Diagnosis Code(s):        --- Professional ---                           K31.7, Polyp of stomach and duodenum                           K29.70, Gastritis, unspecified, without bleeding CPT copyright 2019 American Medical Association. All rights reserved. The codes documented in this report are preliminary and upon coder review may  be revised to meet current compliance requirements. [name], MD [name],  12/28/2020 9:48:04 AM This report has been signed electronically. Number of Addenda: 0

## 2020-12-28 NOTE — Interval H&P Note (Signed)
History and Physical Interval Note:  12/28/2020 9:24 AM Dawn Lin is a 80 y.o. 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  The patient denies having any complaints at the moment.  Has not presented any nausea, vomiting, fever, chills, abdominal distention, melena or hematochezia.  Her most recent hemoglobin was 9.1.  BP (!) 156/68   Temp 98.4 F (36.9 C) (Oral)   Resp 20   Ht 4\' 11"  (1.499 m)   Wt 56.2 kg   LMP  (LMP Unknown)   SpO2 100%   BMI 25.04 kg/m  GENERAL: The patient is AO x3, in no acute distress. HEENT: Head is normocephalic and atraumatic. EOMI are intact. Mouth is well hydrated and without lesions. NECK: Supple. No masses LUNGS: Clear to auscultation. No presence of rhonchi/wheezing/rales. Adequate chest expansion HEART: RRR, normal s1 and s2. ABDOMEN: Soft, nontender, no guarding, no peritoneal signs, and nondistended. BS +. No masses. EXTREMITIES: Without any cyanosis, clubbing, rash, lesions or edema. NEUROLOGIC: AOx3, no focal motor deficit. SKIN: no jaundice, no rashes  [name]  has presented today for surgery, with the diagnosis of Screening Colonoscopy Anemia.  The various methods of treatment have been discussed with the patient and family. After consideration of risks, benefits and other options for treatment, the patient has consented to  Procedure(s) with comments: COLONOSCOPY WITH PROPOFOL (N/A) - AM ESOPHAGOGASTRODUODENOSCOPY (EGD) WITH PROPOFOL (N/A) as a surgical intervention.  The patient's history has been reviewed, patient examined, no change in status, stable for surgery.  I have reviewed the patient's chart and labs.  Questions were answered to the patient's satisfaction.     [name]

## 2020-12-28 NOTE — Transfer of Care (Signed)
Immediate Anesthesia Transfer of Care Note  Patient: Dawn Lin  Procedure(s) Performed: COLONOSCOPY WITH PROPOFOL (N/A ) ESOPHAGOGASTRODUODENOSCOPY (EGD) WITH PROPOFOL (N/A ) BIOPSY  Patient Location: PACU  Anesthesia Type:General  Level of Consciousness: awake, alert , oriented and patient cooperative  Airway & Oxygen Therapy: Patient Spontanous Breathing  Post-op Assessment: Report given to RN, Post -op Vital signs reviewed and stable and Patient moving all extremities X 4  Post vital signs: Reviewed and stable  Last Vitals:  Vitals Value Taken Time  BP 148/60 12/28/20 1023  Temp    Pulse 67 12/28/20 1024  Resp 14 12/28/20 1024  SpO2 95 % 12/28/20 1024  Vitals shown include unvalidated device data.  Last Pain:  Vitals:   12/28/20 0852  TempSrc: Oral  PainSc: 0-No pain      Patients Stated Pain Goal: 6 (04/79/98 7215)  Complications: No complications documented.

## 2020-12-28 NOTE — Anesthesia Postprocedure Evaluation (Signed)
Anesthesia Post Note  Patient: Dawn Lin  Procedure(s) Performed: COLONOSCOPY WITH PROPOFOL (N/A ) ESOPHAGOGASTRODUODENOSCOPY (EGD) WITH PROPOFOL (N/A ) BIOPSY  Patient location during evaluation: PACU Anesthesia Type: General Level of consciousness: awake, oriented and patient cooperative Pain management: pain level controlled Vital Signs Assessment: post-procedure vital signs reviewed and stable Respiratory status: spontaneous breathing, respiratory function stable and nonlabored ventilation Cardiovascular status: stable Postop Assessment: no apparent nausea or vomiting Anesthetic complications: no   No complications documented.   Last Vitals:  Vitals:   12/28/20 0852  BP: (!) 156/68  Resp: 20  Temp: 36.9 C  SpO2: 100%    Last Pain:  Vitals:   12/28/20 0852  TempSrc: Oral  PainSc: 0-No pain                 Ole Lafon

## 2020-12-28 NOTE — Anesthesia Preprocedure Evaluation (Signed)
Anesthesia Evaluation  Patient identified by MRN, date of birth, ID band Patient awake    Reviewed: Allergy & Precautions, NPO status , Patient's Chart, lab work & pertinent test results  History of Anesthesia Complications (+) Family history of anesthesia reactionNegative for: history of anesthetic complications  Airway Mallampati: II  TM Distance: >3 FB    Comment: Arthralgia of right temporomandibular joint Dental  (+) Dental Advisory Given Bridges :   Pulmonary shortness of breath and with exertion,  Right upper lobe adenocarcinoma, Right lung lobectomy   Pulmonary exam normal breath sounds clear to auscultation       Cardiovascular 3 - Mets hypertension, Pt. on medications + CAD and + Peripheral Vascular Disease (carotid bruit )  Normal cardiovascular exam Rhythm:Regular Rate:Normal     Neuro/Psych PSYCHIATRIC DISORDERS Anxiety Depression  Neuromuscular disease    GI/Hepatic Neg liver ROS, GERD  Medicated and Controlled,  Endo/Other  negative endocrine ROS  Renal/GU negative Renal ROS     Musculoskeletal  (+) Arthritis  (sciatica), Arthralgia of right temporomandibular joint, neck pain, low back pain   Abdominal   Peds  Hematology  (+) anemia ,   Anesthesia Other Findings   Reproductive/Obstetrics                             Anesthesia Physical Anesthesia Plan  ASA: III  Anesthesia Plan: General   Post-op Pain Management:    Induction:   PONV Risk Score and Plan: Propofol infusion and TIVA  Airway Management Planned: Nasal Cannula and Natural Airway  Additional Equipment:   Intra-op Plan:   Post-operative Plan:   Informed Consent: I have reviewed the patients History and Physical, chart, labs and discussed the procedure including the risks, benefits and alternatives for the proposed anesthesia with the patient or authorized representative who has indicated his/her  understanding and acceptance.     Dental advisory given  Plan Discussed with: CRNA and Surgeon  Anesthesia Plan Comments:         Anesthesia Quick Evaluation

## 2020-12-31 LAB — SURGICAL PATHOLOGY

## 2021-01-03 ENCOUNTER — Ambulatory Visit (HOSPITAL_COMMUNITY): Payer: Medicare HMO

## 2021-01-07 ENCOUNTER — Encounter (HOSPITAL_COMMUNITY): Payer: Self-pay | Admitting: Gastroenterology

## 2021-01-09 ENCOUNTER — Encounter: Payer: Self-pay | Admitting: Cardiology

## 2021-01-09 ENCOUNTER — Ambulatory Visit (INDEPENDENT_AMBULATORY_CARE_PROVIDER_SITE_OTHER): Payer: Medicare HMO | Admitting: Cardiology

## 2021-01-09 VITALS — BP 146/60 | HR 84 | Ht 59.0 in | Wt 126.2 lb

## 2021-01-09 DIAGNOSIS — I2510 Atherosclerotic heart disease of native coronary artery without angina pectoris: Secondary | ICD-10-CM | POA: Diagnosis not present

## 2021-01-09 DIAGNOSIS — I10 Essential (primary) hypertension: Secondary | ICD-10-CM

## 2021-01-09 DIAGNOSIS — E782 Mixed hyperlipidemia: Secondary | ICD-10-CM | POA: Diagnosis not present

## 2021-01-09 DIAGNOSIS — B379 Candidiasis, unspecified: Secondary | ICD-10-CM | POA: Diagnosis not present

## 2021-01-09 DIAGNOSIS — E559 Vitamin D deficiency, unspecified: Secondary | ICD-10-CM | POA: Diagnosis not present

## 2021-01-09 DIAGNOSIS — I350 Nonrheumatic aortic (valve) stenosis: Secondary | ICD-10-CM

## 2021-01-09 DIAGNOSIS — M1990 Unspecified osteoarthritis, unspecified site: Secondary | ICD-10-CM | POA: Diagnosis not present

## 2021-01-09 DIAGNOSIS — M810 Age-related osteoporosis without current pathological fracture: Secondary | ICD-10-CM | POA: Diagnosis not present

## 2021-01-09 NOTE — Patient Instructions (Signed)
Your physician wants you to follow-up in: 1 YEAR WITH DR BRANCH You will receive a reminder letter in the mail two months in advance. If you don't receive a letter, please call our office to schedule the follow-up appointment.  Your physician recommends that you continue on your current medications as directed. Please refer to the Current Medication list given to you today.  Your physician has requested that you have an echocardiogram. Echocardiography is a painless test that uses sound waves to create images of your heart. It provides your doctor with information about the size and shape of your heart and how well your heart's chambers and valves are working. This procedure takes approximately one hour. There are no restrictions for this procedure.  Thank you for choosing [facility]!!    

## 2021-01-09 NOTE — Progress Notes (Signed)
Clinical Summary Ms. Faulk is a 80 y.o.female seen today for follow up of the following medical problems.    1. CAD - noted on prior CT scsan - 07/2017 nuclear stress: no clear ischemia.  07/2017 echo LVEF 09-32%, grade I diastolic dysfunction   - no recent chest pain. No SOB or DOE   2. HTN - compliant with meds - home bp's 120s/50s   3. Hyperlipidemia - she is on on crestor   07/2020 LDL 84 HDL 67 TG 140 TC 175   4. Lung adenocarcinoma - s/p resection.   5. SOB - 03/2017 PFTs: minimal perfusion defect   6. Mild aortic stenosis 07/2017 echo  mean grad 16, AVA VTI 1.54 would suggest mild AS. - no recent symptoms.    7. Vision change - episode of vision change, lightheadedness while driving - seen at [facility] ER.  - extensive workup in ER was unremarkable. Diagnosed with vertigo      SH: Sister is [place] who is also coming to establish in our clinic   Past Medical History:  Diagnosis Date  . Anxiety   . Dyspnea    with exertion  . 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 Family history of adverse reaction to anesthesia    Children - N/V  . History of blood transfusion   . Hyperlipidemia   . Other osteoporosis   . Other thalassemia (HCC)      Allergies  Allergen Reactions  . Lisinopril Cough     Current Outpatient Medications  Medication Sig Dispense Refill  . acetaminophen (TYLENOL) 325 MG tablet Take 2 tablets (650 mg total) by mouth every 6 (six) hours as needed for mild pain.    . Alpha-D-Galactosidase (BEANO PO) Take 1 capsule by mouth daily as needed (bloating).    Marland Kitchen amLODipine (NORVASC) 10 MG tablet Take 10 mg by mouth daily.    . Ascorbic Acid (VITAMIN C) 500 MG CHEW Chew 500 mg by mouth daily.    Marland Kitchen aspirin EC 81 MG tablet Take 81 mg by mouth daily.    . benzonatate (TESSALON) 100 MG capsule Take 1 capsule (100 mg total) by mouth 3 (three) times daily as needed for cough. 30 capsule  0  . Calcium-Phosphorus-Vitamin D [id number] MG-MG-UNIT CHEW Chew 1 each by mouth 2 (two) times daily.     Marland Kitchen escitalopram (LEXAPRO) 10 MG tablet Take 10 mg by mouth daily.    . fexofenadine (ALLEGRA) 180 MG tablet Take 180 mg by mouth daily as needed for allergies.    . folic acid (FOLVITE) 1 MG tablet Take 1 mg by mouth daily.    Marland Kitchen guaiFENesin (MUCINEX) 600 MG 12 hr tablet Take 600 mg by mouth 2 (two) times daily as needed for cough or to loosen phlegm.    . hydrochlorothiazide (HYDRODIURIL) 25 MG tablet Take 1 tablet (25 mg total) by mouth daily. 93 tablet 4  . ibuprofen (ADVIL) 200 MG tablet Take 200 mg by mouth every 6 (six) hours as needed for moderate pain.    . meclizine (ANTIVERT) 12.5 MG tablet Take 12.5 mg by mouth every 6 (six) hours as needed for dizziness.    . Multiple Vitamin (MULTIVITAMIN WITH MINERALS) TABS tablet Take 1 tablet by mouth daily.    . rosuvastatin (CRESTOR) 5 MG tablet TAKE 1 TABLET BY MOUTH EVERY DAY (Patient taking differently: Take 5 mg by mouth daily.) 90 tablet 1  . simethicone (MYLICON) 80 MG chewable tablet  Chew 80 mg by mouth every 6 (six) hours as needed for flatulence.     No current facility-administered medications for this visit.     Past Surgical History:  Procedure Laterality Date  . BIOPSY  12/28/2020   Procedure: BIOPSY;  Surgeon: Harvel Quale, MD;  [facility];  Service: Gastroenterology;;  . COLONOSCOPY    . COLONOSCOPY WITH PROPOFOL N/A 12/28/2020   Procedure: COLONOSCOPY WITH PROPOFOL;  Surgeon: Harvel Quale, MD;  [facility];  Service: Gastroenterology;  Laterality: N/A;  AM  . ESOPHAGOGASTRODUODENOSCOPY (EGD) WITH PROPOFOL N/A 12/28/2020   Procedure: ESOPHAGOGASTRODUODENOSCOPY (EGD) WITH PROPOFOL;  Surgeon: Harvel Quale, MD;  [facility];  Service: Gastroenterology;  Laterality: N/A;  . INGUINAL HERNIA REPAIR Left   . LUMBAR DISC SURGERY  1990's   for severe pain  and sciatica  . PARTIAL HYSTERECTOMY  1970's   for bleeding and prolapse  . VIDEO ASSISTED THORACOSCOPY (VATS)/ LOBECTOMY Right 04/20/2017   Procedure: VIDEO ASSISTED THORACOSCOPY (VATS)/RIGHT UPPER LOBECTOMY;  Surgeon: Grace Isaac, MD;  Location: [place];  Service: Thoracic;  Laterality: Right;  Marland Kitchen VIDEO BRONCHOSCOPY N/A 04/20/2017   Procedure: VIDEO BRONCHOSCOPY;  Surgeon: Grace Isaac, MD;  Location: [facility];  Service: Thoracic;  Laterality: N/A;     Allergies  Allergen Reactions  . Lisinopril Cough      Family History  Problem Relation Age of Onset  . Hypertension Mother   . Heart attack Sister 92     Social History Ms. Wich reports that she has never smoked. She has never used smokeless tobacco. Ms. Donson reports no history of alcohol use.   Review of Systems CONSTITUTIONAL: No weight loss, fever, chills, weakness or fatigue.  HEENT: Eyes: No visual loss, blurred vision, double vision or yellow sclerae.No hearing loss, sneezing, congestion, runny nose or sore throat.  SKIN: No rash or itching.  CARDIOVASCULAR: per hpi RESPIRATORY: No shortness of breath, cough or sputum.  GASTROINTESTINAL: No anorexia, nausea, vomiting or diarrhea. No abdominal pain or blood.  GENITOURINARY: No burning on urination, no polyuria NEUROLOGICAL: No headache, dizziness, syncope, paralysis, ataxia, numbness or tingling in the extremities. No change in bowel or bladder control.  MUSCULOSKELETAL: No muscle, back pain, joint pain or stiffness.  LYMPHATICS: No enlarged nodes. No history of splenectomy.  PSYCHIATRIC: No history of depression or anxiety.  ENDOCRINOLOGIC: No reports of sweating, cold or heat intolerance. No polyuria or polydipsia.  Marland Kitchen   Physical Examination Today's Vitals   01/09/21 1403  BP: (!) 146/60  Pulse: 84  SpO2: 98%  Weight: 126 lb 3.2 oz (57.2 kg)  Height: 4\' 11"  (1.499 m)   Body mass index is 25.49 kg/m.  Gen: resting comfortably, no acute  distress HEENT: no scleral icterus, pupils equal round and reactive, no palptable cervical adenopathy,  CV: RRR, 3/6 systolic murmur rusb, no jvd Resp: Clear to auscultation bilaterally GI: abdomen is soft, non-tender, non-distended, normal bowel sounds, no hepatosplenomegaly MSK: extremities are warm, no edema.  Skin: warm, no rash Neuro:  no focal deficits Psych: appropriate affect   Diagnostic Studies  07/2018 nuclear stress  Nuclear stress EF: 80%.  There was no ST segment deviation noted during stress.  There is a large defect of moderate severity present in the basal anteroseptal, basal inferoseptal, mid anteroseptal, mid inferoseptal, apical septal and apex location. The defect is non-reversible. In the setting of normal LVF, this is likely due to breast attenuation artifact. No ischemia noted.  This is a low risk study.  The left ventricular ejection fraction is hyperdynamic (>65%).   07/2017 echo Study Conclusions  - Left ventricle: The cavity size was normal. Wall thickness was normal. Systolic function was normal. The estimated ejection fraction was in the range of 60% to 65%. Wall motion was normal; there were no regional wall motion abnormalities. Doppler parameters are consistent with abnormal left ventricular relaxation (grade 1 diastolic dysfunction). - Aortic valve: There was trivial regurgitation.   Assessment and Plan  1. CAD - noted by CT scan, nuclear stress test shows no significnat ischemia - denies symptoms, continue current meds  2. Hyperlipidemia - at goal, continue statin  3. Mild aortic stenosis - repeat echo  4. HTN - home bp's at goal, continue current meds   F/u  1 year  Arnoldo Lenis, M.D.

## 2021-01-29 DIAGNOSIS — C3491 Malignant neoplasm of unspecified part of right bronchus or lung: Secondary | ICD-10-CM | POA: Diagnosis not present

## 2021-01-29 DIAGNOSIS — D649 Anemia, unspecified: Secondary | ICD-10-CM | POA: Diagnosis not present

## 2021-01-29 DIAGNOSIS — I700 Atherosclerosis of aorta: Secondary | ICD-10-CM | POA: Diagnosis not present

## 2021-01-29 DIAGNOSIS — Z299 Encounter for prophylactic measures, unspecified: Secondary | ICD-10-CM | POA: Diagnosis not present

## 2021-01-29 DIAGNOSIS — I10 Essential (primary) hypertension: Secondary | ICD-10-CM | POA: Diagnosis not present

## 2021-01-29 DIAGNOSIS — Z789 Other specified health status: Secondary | ICD-10-CM | POA: Diagnosis not present

## 2021-02-21 ENCOUNTER — Ambulatory Visit (INDEPENDENT_AMBULATORY_CARE_PROVIDER_SITE_OTHER): Payer: Medicare HMO

## 2021-02-21 DIAGNOSIS — I350 Nonrheumatic aortic (valve) stenosis: Secondary | ICD-10-CM | POA: Diagnosis not present

## 2021-02-21 LAB — ECHOCARDIOGRAM COMPLETE
AR max vel: 1.17 cm2
AV Area VTI: 1.15 cm2
AV Area mean vel: 1.37 cm2
AV Mean grad: 15 mmHg
AV Peak grad: 33.7 mmHg
AV Vena cont: 0.27 cm
Ao pk vel: 2.9 m/s
Area-P 1/2: 3.83 cm2
Calc EF: 50.8 %
P 1/2 time: 458 msec
S' Lateral: 3.1 cm
Single Plane A2C EF: 45.9 %
Single Plane A4C EF: 54.5 %

## 2021-02-26 ENCOUNTER — Telehealth: Payer: Self-pay | Admitting: *Deleted

## 2021-02-26 NOTE — Telephone Encounter (Signed)
-----   Message from Arnoldo Lenis, MD sent at 02/26/2021  7:57 AM EDT ----- Heart pumping function remains strong, her aortic valve is only mild to moderately stiffened, we will continue to monitor at this time  Zandra Abts MD

## 2021-02-27 NOTE — Telephone Encounter (Signed)
Pt voiced understanding

## 2021-03-04 ENCOUNTER — Other Ambulatory Visit (INDEPENDENT_AMBULATORY_CARE_PROVIDER_SITE_OTHER): Payer: Self-pay | Admitting: Gastroenterology

## 2021-03-04 DIAGNOSIS — R072 Precordial pain: Secondary | ICD-10-CM

## 2021-03-04 NOTE — Telephone Encounter (Signed)
Last seen 12/13/2020. Says may take pecpid

## 2021-03-25 ENCOUNTER — Other Ambulatory Visit: Payer: Self-pay | Admitting: Thoracic Surgery (Cardiothoracic Vascular Surgery)

## 2021-03-25 DIAGNOSIS — C3490 Malignant neoplasm of unspecified part of unspecified bronchus or lung: Secondary | ICD-10-CM

## 2021-03-25 DIAGNOSIS — C3411 Malignant neoplasm of upper lobe, right bronchus or lung: Secondary | ICD-10-CM

## 2021-03-25 NOTE — Progress Notes (Unsigned)
Ct

## 2021-04-03 DIAGNOSIS — J209 Acute bronchitis, unspecified: Secondary | ICD-10-CM | POA: Diagnosis not present

## 2021-04-03 DIAGNOSIS — Z299 Encounter for prophylactic measures, unspecified: Secondary | ICD-10-CM | POA: Diagnosis not present

## 2021-04-03 DIAGNOSIS — R69 Illness, unspecified: Secondary | ICD-10-CM | POA: Diagnosis not present

## 2021-04-03 DIAGNOSIS — J069 Acute upper respiratory infection, unspecified: Secondary | ICD-10-CM | POA: Diagnosis not present

## 2021-04-11 DIAGNOSIS — K219 Gastro-esophageal reflux disease without esophagitis: Secondary | ICD-10-CM | POA: Diagnosis not present

## 2021-04-11 DIAGNOSIS — I10 Essential (primary) hypertension: Secondary | ICD-10-CM | POA: Diagnosis not present

## 2021-04-30 DIAGNOSIS — C3411 Malignant neoplasm of upper lobe, right bronchus or lung: Secondary | ICD-10-CM | POA: Diagnosis not present

## 2021-04-30 DIAGNOSIS — Z902 Acquired absence of lung [part of]: Secondary | ICD-10-CM | POA: Diagnosis not present

## 2021-04-30 DIAGNOSIS — K8020 Calculus of gallbladder without cholecystitis without obstruction: Secondary | ICD-10-CM | POA: Diagnosis not present

## 2021-04-30 DIAGNOSIS — I700 Atherosclerosis of aorta: Secondary | ICD-10-CM | POA: Diagnosis not present

## 2021-04-30 DIAGNOSIS — C3490 Malignant neoplasm of unspecified part of unspecified bronchus or lung: Secondary | ICD-10-CM | POA: Diagnosis not present

## 2021-05-04 ENCOUNTER — Other Ambulatory Visit: Payer: Self-pay | Admitting: Cardiology

## 2021-05-06 DIAGNOSIS — M25572 Pain in left ankle and joints of left foot: Secondary | ICD-10-CM | POA: Diagnosis not present

## 2021-05-07 DIAGNOSIS — G8929 Other chronic pain: Secondary | ICD-10-CM | POA: Diagnosis not present

## 2021-05-07 DIAGNOSIS — Z299 Encounter for prophylactic measures, unspecified: Secondary | ICD-10-CM | POA: Diagnosis not present

## 2021-05-07 DIAGNOSIS — I10 Essential (primary) hypertension: Secondary | ICD-10-CM | POA: Diagnosis not present

## 2021-05-07 DIAGNOSIS — M549 Dorsalgia, unspecified: Secondary | ICD-10-CM | POA: Diagnosis not present

## 2021-05-07 DIAGNOSIS — D692 Other nonthrombocytopenic purpura: Secondary | ICD-10-CM | POA: Diagnosis not present

## 2021-05-10 ENCOUNTER — Other Ambulatory Visit: Payer: Medicare HMO

## 2021-05-10 ENCOUNTER — Ambulatory Visit: Payer: Medicare HMO | Admitting: Thoracic Surgery (Cardiothoracic Vascular Surgery)

## 2021-05-10 DIAGNOSIS — C3491 Malignant neoplasm of unspecified part of right bronchus or lung: Secondary | ICD-10-CM | POA: Diagnosis not present

## 2021-05-10 DIAGNOSIS — I10 Essential (primary) hypertension: Secondary | ICD-10-CM | POA: Diagnosis not present

## 2021-05-10 DIAGNOSIS — Z299 Encounter for prophylactic measures, unspecified: Secondary | ICD-10-CM | POA: Diagnosis not present

## 2021-05-10 DIAGNOSIS — D561 Beta thalassemia: Secondary | ICD-10-CM | POA: Diagnosis not present

## 2021-05-27 DIAGNOSIS — D563 Thalassemia minor: Secondary | ICD-10-CM | POA: Diagnosis not present

## 2021-05-27 DIAGNOSIS — E559 Vitamin D deficiency, unspecified: Secondary | ICD-10-CM | POA: Diagnosis not present

## 2021-05-27 DIAGNOSIS — R5383 Other fatigue: Secondary | ICD-10-CM | POA: Diagnosis not present

## 2021-05-27 DIAGNOSIS — C3411 Malignant neoplasm of upper lobe, right bronchus or lung: Secondary | ICD-10-CM | POA: Diagnosis not present

## 2021-06-11 DIAGNOSIS — D649 Anemia, unspecified: Secondary | ICD-10-CM | POA: Diagnosis not present

## 2021-06-11 DIAGNOSIS — D563 Thalassemia minor: Secondary | ICD-10-CM | POA: Diagnosis not present

## 2021-06-11 DIAGNOSIS — E559 Vitamin D deficiency, unspecified: Secondary | ICD-10-CM | POA: Diagnosis not present

## 2021-06-11 DIAGNOSIS — C3411 Malignant neoplasm of upper lobe, right bronchus or lung: Secondary | ICD-10-CM | POA: Diagnosis not present

## 2021-06-12 DIAGNOSIS — K219 Gastro-esophageal reflux disease without esophagitis: Secondary | ICD-10-CM | POA: Diagnosis not present

## 2021-06-12 DIAGNOSIS — I10 Essential (primary) hypertension: Secondary | ICD-10-CM | POA: Diagnosis not present

## 2021-07-09 DIAGNOSIS — D563 Thalassemia minor: Secondary | ICD-10-CM | POA: Diagnosis not present

## 2021-07-09 DIAGNOSIS — E559 Vitamin D deficiency, unspecified: Secondary | ICD-10-CM | POA: Diagnosis not present

## 2021-07-09 DIAGNOSIS — D649 Anemia, unspecified: Secondary | ICD-10-CM | POA: Diagnosis not present

## 2021-07-09 DIAGNOSIS — C3411 Malignant neoplasm of upper lobe, right bronchus or lung: Secondary | ICD-10-CM | POA: Diagnosis not present

## 2021-07-12 DIAGNOSIS — K219 Gastro-esophageal reflux disease without esophagitis: Secondary | ICD-10-CM | POA: Diagnosis not present

## 2021-07-12 DIAGNOSIS — I10 Essential (primary) hypertension: Secondary | ICD-10-CM | POA: Diagnosis not present

## 2021-07-15 DIAGNOSIS — X32XXXA Exposure to sunlight, initial encounter: Secondary | ICD-10-CM | POA: Diagnosis not present

## 2021-07-15 DIAGNOSIS — L570 Actinic keratosis: Secondary | ICD-10-CM | POA: Diagnosis not present

## 2021-07-15 DIAGNOSIS — L820 Inflamed seborrheic keratosis: Secondary | ICD-10-CM | POA: Diagnosis not present

## 2021-08-02 DIAGNOSIS — Z7189 Other specified counseling: Secondary | ICD-10-CM | POA: Diagnosis not present

## 2021-08-02 DIAGNOSIS — Z1339 Encounter for screening examination for other mental health and behavioral disorders: Secondary | ICD-10-CM | POA: Diagnosis not present

## 2021-08-02 DIAGNOSIS — R5383 Other fatigue: Secondary | ICD-10-CM | POA: Diagnosis not present

## 2021-08-02 DIAGNOSIS — I10 Essential (primary) hypertension: Secondary | ICD-10-CM | POA: Diagnosis not present

## 2021-08-02 DIAGNOSIS — Z6823 Body mass index (BMI) 23.0-23.9, adult: Secondary | ICD-10-CM | POA: Diagnosis not present

## 2021-08-02 DIAGNOSIS — Z0000 Encounter for general adult medical examination without abnormal findings: Secondary | ICD-10-CM | POA: Diagnosis not present

## 2021-08-02 DIAGNOSIS — I700 Atherosclerosis of aorta: Secondary | ICD-10-CM | POA: Diagnosis not present

## 2021-08-02 DIAGNOSIS — Z299 Encounter for prophylactic measures, unspecified: Secondary | ICD-10-CM | POA: Diagnosis not present

## 2021-08-02 DIAGNOSIS — E7800 Pure hypercholesterolemia, unspecified: Secondary | ICD-10-CM | POA: Diagnosis not present

## 2021-08-02 DIAGNOSIS — Z1331 Encounter for screening for depression: Secondary | ICD-10-CM | POA: Diagnosis not present

## 2021-08-05 DIAGNOSIS — E7800 Pure hypercholesterolemia, unspecified: Secondary | ICD-10-CM | POA: Diagnosis not present

## 2021-08-05 DIAGNOSIS — Z79899 Other long term (current) drug therapy: Secondary | ICD-10-CM | POA: Diagnosis not present

## 2021-08-05 DIAGNOSIS — Z0000 Encounter for general adult medical examination without abnormal findings: Secondary | ICD-10-CM | POA: Diagnosis not present

## 2021-08-05 DIAGNOSIS — R5383 Other fatigue: Secondary | ICD-10-CM | POA: Diagnosis not present

## 2021-08-13 DIAGNOSIS — D500 Iron deficiency anemia secondary to blood loss (chronic): Secondary | ICD-10-CM | POA: Diagnosis not present

## 2021-08-20 DIAGNOSIS — D500 Iron deficiency anemia secondary to blood loss (chronic): Secondary | ICD-10-CM | POA: Diagnosis not present

## 2021-09-17 DIAGNOSIS — D508 Other iron deficiency anemias: Secondary | ICD-10-CM | POA: Diagnosis not present

## 2021-09-17 DIAGNOSIS — C3411 Malignant neoplasm of upper lobe, right bronchus or lung: Secondary | ICD-10-CM | POA: Diagnosis not present

## 2021-09-24 DIAGNOSIS — D500 Iron deficiency anemia secondary to blood loss (chronic): Secondary | ICD-10-CM | POA: Diagnosis not present

## 2021-09-24 DIAGNOSIS — D591 Autoimmune hemolytic anemia, unspecified: Secondary | ICD-10-CM | POA: Diagnosis not present

## 2021-10-11 DIAGNOSIS — R69 Illness, unspecified: Secondary | ICD-10-CM | POA: Diagnosis not present

## 2021-10-11 DIAGNOSIS — M810 Age-related osteoporosis without current pathological fracture: Secondary | ICD-10-CM | POA: Diagnosis not present

## 2021-10-22 DIAGNOSIS — R17 Unspecified jaundice: Secondary | ICD-10-CM | POA: Diagnosis not present

## 2021-10-22 DIAGNOSIS — I10 Essential (primary) hypertension: Secondary | ICD-10-CM | POA: Diagnosis not present

## 2021-10-22 DIAGNOSIS — I700 Atherosclerosis of aorta: Secondary | ICD-10-CM | POA: Diagnosis not present

## 2021-10-22 DIAGNOSIS — D561 Beta thalassemia: Secondary | ICD-10-CM | POA: Diagnosis not present

## 2021-10-22 DIAGNOSIS — Z299 Encounter for prophylactic measures, unspecified: Secondary | ICD-10-CM | POA: Diagnosis not present

## 2021-10-23 DIAGNOSIS — D591 Autoimmune hemolytic anemia, unspecified: Secondary | ICD-10-CM | POA: Diagnosis not present

## 2021-10-23 DIAGNOSIS — C3411 Malignant neoplasm of upper lobe, right bronchus or lung: Secondary | ICD-10-CM | POA: Diagnosis not present

## 2021-10-23 DIAGNOSIS — R5383 Other fatigue: Secondary | ICD-10-CM | POA: Diagnosis not present

## 2021-11-04 DIAGNOSIS — D500 Iron deficiency anemia secondary to blood loss (chronic): Secondary | ICD-10-CM | POA: Diagnosis not present

## 2021-11-04 DIAGNOSIS — C3411 Malignant neoplasm of upper lobe, right bronchus or lung: Secondary | ICD-10-CM | POA: Diagnosis not present

## 2021-11-04 DIAGNOSIS — Z1159 Encounter for screening for other viral diseases: Secondary | ICD-10-CM | POA: Diagnosis not present

## 2021-11-04 DIAGNOSIS — D591 Autoimmune hemolytic anemia, unspecified: Secondary | ICD-10-CM | POA: Diagnosis not present

## 2021-11-04 DIAGNOSIS — E559 Vitamin D deficiency, unspecified: Secondary | ICD-10-CM | POA: Diagnosis not present

## 2021-11-04 DIAGNOSIS — D563 Thalassemia minor: Secondary | ICD-10-CM | POA: Diagnosis not present

## 2021-11-05 DIAGNOSIS — D591 Autoimmune hemolytic anemia, unspecified: Secondary | ICD-10-CM | POA: Diagnosis not present

## 2021-11-05 DIAGNOSIS — D500 Iron deficiency anemia secondary to blood loss (chronic): Secondary | ICD-10-CM | POA: Diagnosis not present

## 2021-11-07 DIAGNOSIS — D591 Autoimmune hemolytic anemia, unspecified: Secondary | ICD-10-CM | POA: Diagnosis not present

## 2021-11-12 DIAGNOSIS — R69 Illness, unspecified: Secondary | ICD-10-CM | POA: Diagnosis not present

## 2021-11-12 DIAGNOSIS — M810 Age-related osteoporosis without current pathological fracture: Secondary | ICD-10-CM | POA: Diagnosis not present

## 2021-11-13 DIAGNOSIS — D591 Autoimmune hemolytic anemia, unspecified: Secondary | ICD-10-CM | POA: Diagnosis not present

## 2021-11-14 DIAGNOSIS — D591 Autoimmune hemolytic anemia, unspecified: Secondary | ICD-10-CM | POA: Diagnosis not present

## 2021-11-20 DIAGNOSIS — C3411 Malignant neoplasm of upper lobe, right bronchus or lung: Secondary | ICD-10-CM | POA: Diagnosis not present

## 2021-11-20 DIAGNOSIS — D500 Iron deficiency anemia secondary to blood loss (chronic): Secondary | ICD-10-CM | POA: Diagnosis not present

## 2021-11-21 DIAGNOSIS — D591 Autoimmune hemolytic anemia, unspecified: Secondary | ICD-10-CM | POA: Diagnosis not present

## 2021-11-27 DIAGNOSIS — D591 Autoimmune hemolytic anemia, unspecified: Secondary | ICD-10-CM | POA: Diagnosis not present

## 2021-11-27 DIAGNOSIS — D563 Thalassemia minor: Secondary | ICD-10-CM | POA: Diagnosis not present

## 2021-11-27 DIAGNOSIS — D500 Iron deficiency anemia secondary to blood loss (chronic): Secondary | ICD-10-CM | POA: Diagnosis not present

## 2021-11-27 DIAGNOSIS — C3411 Malignant neoplasm of upper lobe, right bronchus or lung: Secondary | ICD-10-CM | POA: Diagnosis not present

## 2021-11-27 DIAGNOSIS — R5383 Other fatigue: Secondary | ICD-10-CM | POA: Diagnosis not present

## 2021-11-28 DIAGNOSIS — D591 Autoimmune hemolytic anemia, unspecified: Secondary | ICD-10-CM | POA: Diagnosis not present

## 2021-12-11 DIAGNOSIS — R5383 Other fatigue: Secondary | ICD-10-CM | POA: Diagnosis not present

## 2021-12-11 DIAGNOSIS — R799 Abnormal finding of blood chemistry, unspecified: Secondary | ICD-10-CM | POA: Diagnosis not present

## 2021-12-11 DIAGNOSIS — C3491 Malignant neoplasm of unspecified part of right bronchus or lung: Secondary | ICD-10-CM | POA: Diagnosis not present

## 2021-12-11 DIAGNOSIS — C3411 Malignant neoplasm of upper lobe, right bronchus or lung: Secondary | ICD-10-CM | POA: Diagnosis not present

## 2021-12-11 DIAGNOSIS — R634 Abnormal weight loss: Secondary | ICD-10-CM | POA: Diagnosis not present

## 2021-12-11 DIAGNOSIS — E559 Vitamin D deficiency, unspecified: Secondary | ICD-10-CM | POA: Diagnosis not present

## 2021-12-11 DIAGNOSIS — D508 Other iron deficiency anemias: Secondary | ICD-10-CM | POA: Diagnosis not present

## 2021-12-11 DIAGNOSIS — D591 Autoimmune hemolytic anemia, unspecified: Secondary | ICD-10-CM | POA: Diagnosis not present

## 2021-12-18 DIAGNOSIS — D591 Autoimmune hemolytic anemia, unspecified: Secondary | ICD-10-CM | POA: Diagnosis not present

## 2021-12-23 DIAGNOSIS — E46 Unspecified protein-calorie malnutrition: Secondary | ICD-10-CM | POA: Diagnosis not present

## 2021-12-23 DIAGNOSIS — Z299 Encounter for prophylactic measures, unspecified: Secondary | ICD-10-CM | POA: Diagnosis not present

## 2021-12-23 DIAGNOSIS — R69 Illness, unspecified: Secondary | ICD-10-CM | POA: Diagnosis not present

## 2021-12-23 DIAGNOSIS — I10 Essential (primary) hypertension: Secondary | ICD-10-CM | POA: Diagnosis not present

## 2021-12-23 DIAGNOSIS — D692 Other nonthrombocytopenic purpura: Secondary | ICD-10-CM | POA: Diagnosis not present

## 2022-01-09 DIAGNOSIS — R69 Illness, unspecified: Secondary | ICD-10-CM | POA: Diagnosis not present

## 2022-01-09 DIAGNOSIS — M810 Age-related osteoporosis without current pathological fracture: Secondary | ICD-10-CM | POA: Diagnosis not present

## 2022-01-15 DIAGNOSIS — M9903 Segmental and somatic dysfunction of lumbar region: Secondary | ICD-10-CM | POA: Diagnosis not present

## 2022-01-15 DIAGNOSIS — M546 Pain in thoracic spine: Secondary | ICD-10-CM | POA: Diagnosis not present

## 2022-01-15 DIAGNOSIS — M5136 Other intervertebral disc degeneration, lumbar region: Secondary | ICD-10-CM | POA: Diagnosis not present

## 2022-01-15 DIAGNOSIS — M9905 Segmental and somatic dysfunction of pelvic region: Secondary | ICD-10-CM | POA: Diagnosis not present

## 2022-01-15 DIAGNOSIS — M9902 Segmental and somatic dysfunction of thoracic region: Secondary | ICD-10-CM | POA: Diagnosis not present

## 2022-01-22 DIAGNOSIS — D591 Autoimmune hemolytic anemia, unspecified: Secondary | ICD-10-CM | POA: Diagnosis not present

## 2022-01-22 DIAGNOSIS — M546 Pain in thoracic spine: Secondary | ICD-10-CM | POA: Diagnosis not present

## 2022-01-22 DIAGNOSIS — M9903 Segmental and somatic dysfunction of lumbar region: Secondary | ICD-10-CM | POA: Diagnosis not present

## 2022-01-22 DIAGNOSIS — M9902 Segmental and somatic dysfunction of thoracic region: Secondary | ICD-10-CM | POA: Diagnosis not present

## 2022-01-22 DIAGNOSIS — M5136 Other intervertebral disc degeneration, lumbar region: Secondary | ICD-10-CM | POA: Diagnosis not present

## 2022-01-22 DIAGNOSIS — M9905 Segmental and somatic dysfunction of pelvic region: Secondary | ICD-10-CM | POA: Diagnosis not present

## 2022-01-24 ENCOUNTER — Inpatient Hospital Stay (HOSPITAL_COMMUNITY): Payer: Medicare HMO

## 2022-01-24 ENCOUNTER — Inpatient Hospital Stay (HOSPITAL_COMMUNITY): Payer: Medicare HMO | Attending: Hematology | Admitting: Hematology

## 2022-01-24 DIAGNOSIS — R7989 Other specified abnormal findings of blood chemistry: Secondary | ICD-10-CM | POA: Insufficient documentation

## 2022-01-24 DIAGNOSIS — Z79899 Other long term (current) drug therapy: Secondary | ICD-10-CM | POA: Diagnosis not present

## 2022-01-24 DIAGNOSIS — Z7982 Long term (current) use of aspirin: Secondary | ICD-10-CM | POA: Insufficient documentation

## 2022-01-24 DIAGNOSIS — Z7952 Long term (current) use of systemic steroids: Secondary | ICD-10-CM | POA: Insufficient documentation

## 2022-01-24 DIAGNOSIS — D590 Drug-induced autoimmune hemolytic anemia: Secondary | ICD-10-CM

## 2022-01-24 DIAGNOSIS — D589 Hereditary hemolytic anemia, unspecified: Secondary | ICD-10-CM | POA: Insufficient documentation

## 2022-01-24 DIAGNOSIS — Z803 Family history of malignant neoplasm of breast: Secondary | ICD-10-CM | POA: Insufficient documentation

## 2022-01-24 DIAGNOSIS — Z902 Acquired absence of lung [part of]: Secondary | ICD-10-CM | POA: Diagnosis not present

## 2022-01-24 DIAGNOSIS — Z85118 Personal history of other malignant neoplasm of bronchus and lung: Secondary | ICD-10-CM | POA: Insufficient documentation

## 2022-01-24 DIAGNOSIS — D591 Autoimmune hemolytic anemia, unspecified: Secondary | ICD-10-CM | POA: Diagnosis not present

## 2022-01-24 LAB — CBC WITH DIFFERENTIAL/PLATELET
Abs Immature Granulocytes: 0.08 10*3/uL — ABNORMAL HIGH (ref 0.00–0.07)
Basophils Absolute: 0.1 10*3/uL (ref 0.0–0.1)
Basophils Relative: 1 %
Eosinophils Absolute: 0.1 10*3/uL (ref 0.0–0.5)
Eosinophils Relative: 1 %
HCT: 31.3 % — ABNORMAL LOW (ref 36.0–46.0)
Hemoglobin: 10 g/dL — ABNORMAL LOW (ref 12.0–15.0)
Immature Granulocytes: 1 %
Lymphocytes Relative: 5 %
Lymphs Abs: 0.7 10*3/uL (ref 0.7–4.0)
MCH: 22 pg — ABNORMAL LOW (ref 26.0–34.0)
MCHC: 31.9 g/dL (ref 30.0–36.0)
MCV: 68.9 fL — ABNORMAL LOW (ref 80.0–100.0)
Monocytes Absolute: 0.8 10*3/uL (ref 0.1–1.0)
Monocytes Relative: 6 %
Neutro Abs: 10.7 10*3/uL — ABNORMAL HIGH (ref 1.7–7.7)
Neutrophils Relative %: 86 %
Platelets: 301 10*3/uL (ref 150–400)
RBC: 4.54 MIL/uL (ref 3.87–5.11)
RDW: 15.2 % (ref 11.5–15.5)
WBC: 12.4 10*3/uL — ABNORMAL HIGH (ref 4.0–10.5)
nRBC: 0.2 % (ref 0.0–0.2)

## 2022-01-24 LAB — IRON AND TIBC
Iron: 130 ug/dL (ref 28–170)
Saturation Ratios: 56 % — ABNORMAL HIGH (ref 10.4–31.8)
TIBC: 231 ug/dL — ABNORMAL LOW (ref 250–450)
UIBC: 101 ug/dL

## 2022-01-24 LAB — DIRECT ANTIGLOBULIN TEST (NOT AT ARMC)
DAT, IgG: NEGATIVE
DAT, complement: POSITIVE

## 2022-01-24 LAB — RETICULOCYTES
Immature Retic Fract: 23.7 % — ABNORMAL HIGH (ref 2.3–15.9)
RBC.: 4.54 MIL/uL (ref 3.87–5.11)
Retic Count, Absolute: 159.4 10*3/uL (ref 19.0–186.0)
Retic Ct Pct: 3.5 % — ABNORMAL HIGH (ref 0.4–3.1)

## 2022-01-24 LAB — FERRITIN: Ferritin: 569 ng/mL — ABNORMAL HIGH (ref 11–307)

## 2022-01-24 LAB — FOLATE: Folate: >100 ng/mL (ref >5.9)

## 2022-01-24 LAB — LACTATE DEHYDROGENASE: LDH: 220 U/L — ABNORMAL HIGH (ref 98–192)

## 2022-01-24 LAB — VITAMIN B12: Vitamin B-12: 646 pg/mL (ref 180–914)

## 2022-01-24 NOTE — Progress Notes (Signed)
? ?[facility] ?[address] ?[place], [place] [address] ? ? ?CLINIC:  ?Medical Oncology/Hematology ? ?Patient Care Team: ?[name], MD as PCP - General (Internal Medicine) ?Arnoldo Lenis, MD as PCP - Cardiology (Cardiology) ?Derek Jack, MD as Medical Oncologist (Hematology) ? 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 ?HISTORY OF PRESENTING ILLNESS:  ?Dawn Lin 81 y.o. 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 ? ?Today she reports feeling good. She has a history of hemolytic anemia starting in Fall 2022 at which time her energy declined and she was referred to [facility]. She presents at this time to transfer her care from [facility]. She is currently taking prednisone; she was started at around 60 mg and then was tapered down. She was given 2 iron infusions following which she reports her skin turned yellow. She is currently taking folic acid. She denies hematuria, hematochezia, and black stools. Her energy levels have improved and stabilized over the past couple of weeks. She denies history of Covid or influenza infections over the past year. She reports frequent rhinorrhea. She has history of stage 1 lung adenocarcinoma resected 04/20/2017. She reports night sweats occurring 2-3 times weekly that started around the time she started on prednisone and have improved since the dose has been reduced. She lost 13 lbs 6 months ago when her energy intimally declined, and she has gained back 2-3 lbs. She denies history of CVA, MI, PE, DVT, RA, and lupus. She denies skin rash and easy bruising or bleeding.  ? ?She currently lives at home on her own. 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 She denies chemical exposure. She is unaware of her family medical history.  ? ?MEDICAL HISTORY:  ?Past Medical History:   ?Diagnosis Date  ? Anxiety   ? Dyspnea   ? with exertion  ? Essential hypertension, benign since the 1900's  ? Family history of adverse reaction to anesthesia   ? Children - N/V  ? History of blood transfusion   ? Hyperlipidemia   ? Other osteoporosis   ? Other thalassemia ([place])   ? ? ?SURGICAL HISTORY: ?Past Surgical History:  ?Procedure Laterality Date  ? BIOPSY  12/28/2020  ? Procedure: BIOPSY;  Surgeon: Harvel Quale, MD;  [facility];  Service: Gastroenterology;;  ? COLONOSCOPY    ? COLONOSCOPY WITH PROPOFOL N/A 12/28/2020  ? Procedure: COLONOSCOPY WITH PROPOFOL;  Surgeon: Harvel Quale, MD;  [facility];  Service: Gastroenterology;  Laterality: N/A;  AM  ? ESOPHAGOGASTRODUODENOSCOPY (EGD) WITH PROPOFOL N/A 12/28/2020  ? Procedure: ESOPHAGOGASTRODUODENOSCOPY (EGD) WITH PROPOFOL;  Surgeon: Harvel Quale, MD;  [facility];  Service: Gastroenterology;  Laterality: N/A;  ? INGUINAL HERNIA REPAIR Left   ? LUMBAR DISC SURGERY  1990's  ? for severe pain and sciatica  ? PARTIAL HYSTERECTOMY  1970's  ? for bleeding and prolapse  ? VIDEO ASSISTED THORACOSCOPY (VATS)/ LOBECTOMY Right 04/20/2017  ? Procedure: VIDEO ASSISTED THORACOSCOPY (VATS)/RIGHT UPPER LOBECTOMY;  Surgeon: Grace Isaac, MD;  Location: [facility];  Service: Thoracic;  Laterality: Right;  ? VIDEO BRONCHOSCOPY N/A 04/20/2017  ? Procedure: VIDEO BRONCHOSCOPY;  Surgeon: Grace Isaac, MD;  Location: [facility];  Service: Thoracic;  Laterality: N/A;  ? ? ?SOCIAL HISTORY: ?Social History  ? ?Socioeconomic History  ? Marital status: Widowed  ?  Spouse name: Not on file  ? Number of children: Not on file  ? Years of education: Not on file  ? Highest education level: Not on file  ?Occupational History  ? Occupation: retired  ?Tobacco Use  ? Smoking status: Never  ? Smokeless tobacco: Never  ?Vaping Use  ? Vaping Use: Never used  ?Substance and Sexual Activity  ? Alcohol use: No  ? Drug use: No  ?  Sexual activity: Never  ?Other Topics Concern  ? Not on file  ?Social History Narrative  ? Mrs. Shepardson is married. Her husband has been chronically ill for 6 years (2011). He has metastatic prostate cancer and multiple myeloma. 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 She has a lot of family support.   ? ?Social Determinants of Health  ? ?Financial Resource Strain: Not on file  ?Food Insecurity: Not on file  ?Transportation Needs: Not on file  ?Physical Activity: Not on file  ?Stress: Not on file  ?Social Connections: Not on file  ?Intimate Partner Violence: Not on file  ? ? ?FAMILY HISTORY: ?Family History  ?Problem Relation Age of Onset  ? Hypertension Mother   ? Heart attack Sister 32  ? ? ?ALLERGIES:  is allergic to lisinopril. ? ?MEDICATIONS:  ?Current Outpatient Medications  ?Medication Sig Dispense Refill  ? acetaminophen (TYLENOL) 325 MG tablet Take 2 tablets (650 mg total) by mouth every 6 (six) hours as needed for mild pain.    ? Alpha-D-Galactosidase (BEANO PO) Take 1 capsule by mouth daily as needed (bloating).    ? amLODipine (NORVASC) 10 MG tablet Take 10 mg by mouth daily.    ? Ascorbic Acid (VITAMIN C) 500 MG CHEW Chew 500 mg by mouth daily.    ? aspirin EC 81 MG tablet Take 81 mg by mouth daily.    ? benzonatate (TESSALON) 100 MG capsule Take 1 capsule (100 mg total) by mouth 3 (three) times daily as needed for cough. 30 capsule 0  ? Calcium-Phosphorus-Vitamin D [phone] MG-MG-UNIT CHEW Chew 1 each by mouth 2 (two) times daily.     ? escitalopram (LEXAPRO) 10 MG tablet Take 10 mg by mouth daily.    ? fexofenadine (ALLEGRA) 180 MG tablet Take 180 mg by mouth daily as needed for allergies.    ? folic acid (FOLVITE) 1 MG tablet Take 1 mg by mouth daily.    ? guaiFENesin (MUCINEX) 600 MG 12 hr tablet Take 600 mg by mouth 2 (two) times daily as needed for cough or to loosen phlegm.    ? hydrochlorothiazide (HYDRODIURIL) 25 MG tablet Take 1 tablet (25 mg total) by mouth daily. 93 tablet  4  ? ibuprofen (ADVIL) 200 MG tablet Take 200 mg by mouth every 6 (six) hours as needed for moderate pain.    ? meclizine (ANTIVERT) 12.5 MG tablet Take 12.5 mg by mouth every 6 (six) hours as needed for dizziness.    ? Multiple Vitamin (MULTIVITAMIN WITH MINERALS) TABS tablet Take 1 tablet by mouth daily.    ? Omega-3 1000 MG CAPS Take by mouth.    ? predniSONE (DELTASONE) 5 MG tablet Take by mouth.    ? rosuvastatin (CRESTOR) 5 MG tablet TAKE 1 TABLET BY MOUTH EVERY DAY 90 tablet 3  ? simethicone (MYLICON) 80 MG chewable tablet Chew 80 mg by mouth every 6 (six) hours as needed for flatulence.    ? ?No current facility-administered medications for this visit.  ? ? ?REVIEW  OF SYSTEMS:   ?Review of Systems  ?Constitutional:  Positive for unexpected weight change (-13 lbs). Negative for appetite change and fatigue.  ?HENT:     ?     Rhinorrhea  ?Respiratory:  Positive for shortness of breath.   ?Gastrointestinal:  Negative for blood in stool.  ?Endocrine: Positive for hot flashes (night sweats).  ?Genitourinary:  Negative for hematuria.   ?Skin:  Negative for rash.  ?Neurological:  Positive for dizziness and headaches.  ?Hematological:  Does not bruise/bleed easily.  ?All other systems reviewed and are negative. ? ? ?PHYSICAL EXAMINATION: ?ECOG PERFORMANCE STATUS: 1 - Symptomatic but completely ambulatory ? ?Vitals:  ? 01/24/22 0952  ?BP: 132/72  ?Pulse: 80  ?Resp: 18  ?Temp: 98.1 ?F (36.7 ?C)  ?SpO2: 98%  ? ?Filed Weights  ? 01/24/22 0952  ?Weight: 122 lb 6.4 oz (55.5 kg)  ? ?Physical Exam ?Vitals reviewed.  ?Constitutional:   ?   Appearance: Normal appearance.  ?Cardiovascular:  ?   Rate and Rhythm: Normal rate and regular rhythm.  ?   Pulses: Normal pulses.  ?   Heart sounds: Normal heart sounds.  ?Pulmonary:  ?   Effort: Pulmonary effort is normal.  ?   Breath sounds: Normal breath sounds.  ?Abdominal:  ?   Palpations: Abdomen is soft. There is no hepatomegaly, splenomegaly or mass.  ?   Tenderness: There is  abdominal tenderness in the left upper quadrant.  ?Musculoskeletal:  ?   Right lower leg: No edema.  ?   Left lower leg: No edema.  ?Lymphadenopathy:  ?   Cervical: No cervical adenopathy.  ?   Right cer

## 2022-01-24 NOTE — Patient Instructions (Addendum)
[facility] ?Discharge Instructions ? ?You were seen and examined today by Dr. Delton Coombes. Dr. Delton Coombes is a oncologist/hematologist, meaning that he specializes in blood abnormalities and cancers. Dr. Delton Coombes discussed your past medical history, family history of cancers/blood conditions and the events that led to you being here today. ? ?You were referred to Dr. Delton Coombes to establish Hematology and Oncology care. Dr. Delton Coombes has recommended additional lab work today to establish a baseline here. ? ?Dr. Delton Coombes recommended a follow-up CT scan of your chest for lung cancer monitoring. ? ?Follow-up as scheduled. ? ? ?Thank you for choosing [place] at [facility] to provide your oncology and hematology care.  To afford each patient quality time with our provider, please arrive at least 15 minutes before your scheduled appointment time.  ? ?If you have a lab appointment with the [facility] please come in thru the Main Entrance and check in at the main information desk. ? ?You need to re-schedule your appointment should you arrive 10 or more minutes late.  We strive to give you quality time with our providers, and arriving late affects you and other patients whose appointments are after yours.  Also, if you no show three or more times for appointments you may be dismissed from the clinic at the providers discretion.     ?Again, thank you for choosing [facility].  Our hope is that these requests will decrease the amount of time that you wait before being seen by our physicians.       ?_____________________________________________________________ ? ?Should you have questions after your visit to [facility], please contact our office at [phone] and follow the prompts.  Our office hours are 8:00 a.m. and 4:30 p.m. Monday - Friday.  Please note that voicemails left after 4:00 p.m. may not be returned until the  following business day.  We are closed weekends and major holidays.  You do have access to a nurse 24-7, just call the main number to the clinic [phone] and do not press any options, hold on the line and a nurse will answer the phone.   ? ?For prescription refill requests, have your pharmacy contact our office and allow 72 hours.   ? ?Due to Covid, you will need to wear a mask upon entering the hospital. If you do not have a mask, a mask will be given to you at the Main Entrance upon arrival. For doctor visits, patients may have 1 support person age 65 or older with them. For treatment visits, patients can not have anyone with them due to social distancing guidelines and our immunocompromised population.  ? ? ? ?

## 2022-01-25 LAB — COPPER, SERUM: Copper: 102 ug/dL (ref 80–158)

## 2022-01-25 LAB — HAPTOGLOBIN: Haptoglobin: <10 mg/dL — ABNORMAL LOW (ref 42–346)

## 2022-01-27 DIAGNOSIS — Z789 Other specified health status: Secondary | ICD-10-CM | POA: Diagnosis not present

## 2022-01-27 DIAGNOSIS — J069 Acute upper respiratory infection, unspecified: Secondary | ICD-10-CM | POA: Diagnosis not present

## 2022-01-27 DIAGNOSIS — I10 Essential (primary) hypertension: Secondary | ICD-10-CM | POA: Diagnosis not present

## 2022-01-27 DIAGNOSIS — Z299 Encounter for prophylactic measures, unspecified: Secondary | ICD-10-CM | POA: Diagnosis not present

## 2022-01-28 LAB — METHYLMALONIC ACID, SERUM: Methylmalonic Acid, Quantitative: 200 nmol/L (ref 0–378)

## 2022-01-29 ENCOUNTER — Other Ambulatory Visit (HOSPITAL_COMMUNITY): Payer: Self-pay | Admitting: Internal Medicine

## 2022-01-29 DIAGNOSIS — M9903 Segmental and somatic dysfunction of lumbar region: Secondary | ICD-10-CM | POA: Diagnosis not present

## 2022-01-29 DIAGNOSIS — M5136 Other intervertebral disc degeneration, lumbar region: Secondary | ICD-10-CM | POA: Diagnosis not present

## 2022-01-29 DIAGNOSIS — M546 Pain in thoracic spine: Secondary | ICD-10-CM | POA: Diagnosis not present

## 2022-01-29 DIAGNOSIS — Z1231 Encounter for screening mammogram for malignant neoplasm of breast: Secondary | ICD-10-CM

## 2022-01-29 DIAGNOSIS — M9905 Segmental and somatic dysfunction of pelvic region: Secondary | ICD-10-CM | POA: Diagnosis not present

## 2022-01-29 DIAGNOSIS — M9902 Segmental and somatic dysfunction of thoracic region: Secondary | ICD-10-CM | POA: Diagnosis not present

## 2022-02-05 DIAGNOSIS — M9903 Segmental and somatic dysfunction of lumbar region: Secondary | ICD-10-CM | POA: Diagnosis not present

## 2022-02-05 DIAGNOSIS — M5136 Other intervertebral disc degeneration, lumbar region: Secondary | ICD-10-CM | POA: Diagnosis not present

## 2022-02-05 DIAGNOSIS — M546 Pain in thoracic spine: Secondary | ICD-10-CM | POA: Diagnosis not present

## 2022-02-05 DIAGNOSIS — M9905 Segmental and somatic dysfunction of pelvic region: Secondary | ICD-10-CM | POA: Diagnosis not present

## 2022-02-05 DIAGNOSIS — M9902 Segmental and somatic dysfunction of thoracic region: Secondary | ICD-10-CM | POA: Diagnosis not present

## 2022-02-13 ENCOUNTER — Ambulatory Visit (HOSPITAL_COMMUNITY): Payer: Medicare HMO

## 2022-02-16 ENCOUNTER — Emergency Department (HOSPITAL_COMMUNITY): Payer: Medicare HMO

## 2022-02-16 ENCOUNTER — Emergency Department (HOSPITAL_COMMUNITY)
Admission: EM | Admit: 2022-02-16 | Discharge: 2022-02-16 | Disposition: A | Discharge status: Home / Self Care | Payer: Medicare HMO | Attending: Emergency Medicine | Admitting: Emergency Medicine

## 2022-02-16 ENCOUNTER — Other Ambulatory Visit: Payer: Self-pay

## 2022-02-16 ENCOUNTER — Encounter (HOSPITAL_COMMUNITY): Payer: Self-pay | Admitting: Emergency Medicine

## 2022-02-16 DIAGNOSIS — M5416 Radiculopathy, lumbar region: Secondary | ICD-10-CM | POA: Diagnosis not present

## 2022-02-16 DIAGNOSIS — Z7982 Long term (current) use of aspirin: Secondary | ICD-10-CM | POA: Insufficient documentation

## 2022-02-16 DIAGNOSIS — I10 Essential (primary) hypertension: Secondary | ICD-10-CM | POA: Diagnosis not present

## 2022-02-16 DIAGNOSIS — M545 Low back pain, unspecified: Secondary | ICD-10-CM | POA: Diagnosis not present

## 2022-02-16 DIAGNOSIS — M5410 Radiculopathy, site unspecified: Secondary | ICD-10-CM

## 2022-02-16 DIAGNOSIS — R102 Pelvic and perineal pain: Secondary | ICD-10-CM | POA: Diagnosis not present

## 2022-02-16 DIAGNOSIS — Z79899 Other long term (current) drug therapy: Secondary | ICD-10-CM | POA: Insufficient documentation

## 2022-02-16 HISTORY — DX: Anemia, unspecified: D64.9

## 2022-02-16 MED ORDER — OXYCODONE-ACETAMINOPHEN 5-325 MG PO TABS: 1.0000 | ORAL_TABLET | Freq: Two times a day (BID) | ORAL | 0 refills | Status: DC | PRN

## 2022-02-16 MED ORDER — NAPROXEN 250 MG PO TABS: 250.0000 mg | ORAL_TABLET | Freq: Two times a day (BID) | ORAL | 0 refills | Status: DC

## 2022-02-16 MED ORDER — FENTANYL CITRATE PF 50 MCG/ML IJ SOSY
50.0000 ug | PREFILLED_SYRINGE | Freq: Once | INTRAMUSCULAR | Status: AC
  Administered 2022-02-16: 50 ug via INTRAMUSCULAR
  Filled 2022-02-16: qty 1

## 2022-02-16 MED ORDER — ACETAMINOPHEN 500 MG PO TABS: 500.0000 mg | ORAL_TABLET | Freq: Four times a day (QID) | ORAL | 0 refills | Status: DC | PRN

## 2022-02-16 MED ORDER — TIZANIDINE HCL 2 MG PO CAPS: 2.0000 mg | ORAL_CAPSULE | Freq: Three times a day (TID) | ORAL | 0 refills | Status: DC | PRN

## 2022-02-16 MED ORDER — DIAZEPAM 2 MG PO TABS
2.0000 mg | ORAL_TABLET | Freq: Once | ORAL | Status: AC
  Administered 2022-02-16: 2 mg via ORAL
  Filled 2022-02-16: qty 1

## 2022-02-16 MED ORDER — NAPROXEN 250 MG PO TABS
250.0000 mg | ORAL_TABLET | Freq: Once | ORAL | Status: AC
  Administered 2022-02-16: 250 mg via ORAL
  Filled 2022-02-16: qty 1

## 2022-02-16 NOTE — Discharge Instructions (Addendum)
Take the medicines prescribed and see your primary care doctor in 1 week. ? ?Return to the ER if your symptoms get worse. ?

## 2022-02-16 NOTE — ED Triage Notes (Signed)
Patient c/o left lower back pain that started Friday and is progressively getting worse. Per patient pain radiates into left hip and upper thigh. Pain worse with walking causing her to become nauseated. Denies any injury or urinary symptoms. Per patient started after she had been coughing frequently last week. Patient took x2 "muscle relaxers" yesterday and hydrocodone today with no improvement. Patient last took hydrocodone at 10:00am today.  ?

## 2022-02-16 NOTE — ED Provider Notes (Signed)
?[place] ?Provider Note ? ? ?CSN: [id number] ?Arrival date & time: 02/16/22  1044 ? ?  ? ?History ? ?Chief Complaint  ?Patient presents with  ? Back Pain  ? ? ?Dawn Lin is a 81 y.o. female. ? ?HPI ? ?  ? ?Patient comes in with chief complaint of lower back pain. ?Patient history of hypertension, dyspnea, hyperlipidemia.  She has chronic arthritis of the back.  The back pain she has been having over the last 2 days is abnormal for her.  She indicates that the pain is constantly dull with waxing waning intensity of sharp pain, excruciating pain.  Pain is worse when she is upright and walking.  There is no specific evoking factor.  Patient has no burning with urination, blood in the urine, diarrhea. Pt has no associated numbness, weakness, urinary incontinence, urinary retention, bowel incontinence, pins and needle sensation in the perineal area. ? ? ? ?Home Medications ?Prior to Admission medications   ?Medication Sig Start Date End Date Taking? Authorizing Provider  ?acetaminophen (TYLENOL) 500 MG tablet Take 1 tablet (500 mg total) by mouth every 6 (six) hours as needed. 02/16/22  Yes Varney Biles, MD  ?naproxen (NAPROSYN) 250 MG tablet Take 1 tablet (250 mg total) by mouth 2 (two) times daily with a meal. 02/16/22  Yes Trevaris Pennella, MD  ?oxyCODONE-acetaminophen (PERCOCET/ROXICET) 5-325 MG tablet Take 1 tablet by mouth every 12 (twelve) hours as needed for severe pain. 02/16/22  Yes Varney Biles, MD  ?tizanidine (ZANAFLEX) 2 MG capsule Take 1 capsule (2 mg total) by mouth 3 (three) times daily as needed for muscle spasms. 02/16/22  Yes Varney Biles, MD  ?Alpha-D-Galactosidase (BEANO PO) Take 1 capsule by mouth daily as needed (bloating).    [provider]  ?amLODipine (NORVASC) 10 MG tablet Take 10 mg by mouth daily.    [provider]  ?Ascorbic Acid (VITAMIN C) 500 MG CHEW Chew 500 mg by mouth daily.    [provider]  ?aspirin EC 81 MG  tablet Take 81 mg by mouth daily.    [provider]  ?benzonatate (TESSALON) 100 MG capsule Take 1 capsule (100 mg total) by mouth 3 (three) times daily as needed for cough. 09/25/20   Emerson Monte, FNP  ?Calcium-Phosphorus-Vitamin D [phone] MG-MG-UNIT CHEW Chew 1 each by mouth 2 (two) times daily.     [provider]  ?escitalopram (LEXAPRO) 10 MG tablet Take 10 mg by mouth daily.    [provider]  ?fexofenadine (ALLEGRA) 180 MG tablet Take 180 mg by mouth daily as needed for allergies.    [provider]  ?folic acid (FOLVITE) 1 MG tablet Take 1 mg by mouth daily.    [provider]  ?guaiFENesin (MUCINEX) 600 MG 12 hr tablet Take 600 mg by mouth 2 (two) times daily as needed for cough or to loosen phlegm.    [provider]  ?hydrochlorothiazide (HYDRODIURIL) 25 MG tablet Take 1 tablet (25 mg total) by mouth daily. 09/11/11   Burman Freestone, MD  ?ibuprofen (ADVIL) 200 MG tablet Take 200 mg by mouth every 6 (six) hours as needed for moderate pain.    [provider]  ?meclizine (ANTIVERT) 12.5 MG tablet Take 12.5 mg by mouth every 6 (six) hours as needed for dizziness. 11/19/19   [provider]  ?Multiple Vitamin (MULTIVITAMIN WITH MINERALS) TABS tablet Take 1 tablet by mouth daily.    [provider]  ?Omega-3 1000 MG  CAPS Take by mouth.    [provider]  ?predniSONE (DELTASONE) 5 MG tablet Take by mouth. 11/19/21   [provider]  ?rosuvastatin (CRESTOR) 5 MG tablet TAKE 1 TABLET BY MOUTH EVERY DAY 05/06/21   Arnoldo Lenis, MD  ?simethicone (MYLICON) 80 MG chewable tablet Chew 80 mg by mouth every 6 (six) hours as needed for flatulence.    [provider]  ?   ? ?Allergies    ?Lisinopril   ? ?Review of Systems   ?Review of Systems  ?All other systems reviewed and are negative. ? ?Physical Exam ?Updated Vital Signs ?BP (!) 147/69   Pulse 74   Temp 98.1 ?F (36.7 ?C) (Oral)   Resp 18    Ht 4\' 11"  (1.499 m)   Wt 54.9 kg   LMP  (LMP Unknown)   SpO2 94%   BMI 24.44 kg/m?  ?Physical Exam ?Vitals and nursing note reviewed.  ?Constitutional:   ?   Appearance: She is well-developed.  ?HENT:  ?   Head: Atraumatic.  ?Cardiovascular:  ?   Rate and Rhythm: Normal rate.  ?Pulmonary:  ?   Effort: Pulmonary effort is normal.  ?Musculoskeletal:  ?   Cervical back: Normal range of motion and neck supple.  ?   Comments: Pt has tenderness over the lumbar region, left pelvis and paraspinal region on the left lumbar side ?No step offs, no erythema. ?Pt has 1+ patellar reflex bilaterally. ?Able to discriminate between sharp and dull. ?Positive passive leg raise ?  ?Skin: ?   General: Skin is warm and dry.  ?Neurological:  ?   Mental Status: She is alert and oriented to person, place, and time.  ? ? ?ED Results / Procedures / Treatments   ?Labs ?(all labs ordered are listed, but only abnormal results are displayed) ?Labs Reviewed - No data to display ? ?EKG ?None ? ?Radiology ?DG Lumbar Spine Complete ? ?Result Date: 02/16/2022 ?CLINICAL DATA:  Worsening low back pain beginning 2 days ago. EXAM: LUMBAR SPINE - COMPLETE 4+ VIEW COMPARISON:  None Available. FINDINGS: There is no evidence of lumbar spine fracture. Moderate severe degenerative disc disease and bilateral facet DJD is seen at L4-5 and L5-S1. Grade 2 anterolisthesis seen at L5-S1 measuring 12 mm, likely degenerative in etiology. IMPRESSION: No acute findings. 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 Electronically Signed   By: Marlaine Hind M.D.   On: 02/16/2022 12:46  ? ?DG Pelvis 1-2 Views ? ?Result Date: 02/16/2022 ?CLINICAL DATA:  Left back, hip and thigh pain beginning 2 days ago. EXAM: PELVIS - 1-2 VIEW COMPARISON:  CT 03/18/2017 FINDINGS: Bones of the pelvis are normal. No joint space narrowing of either hip joint. No sign of fracture or focal bone lesion. Lower lumbar degenerative changes are  noted. IMPRESSION: No abnormality of the pelvis or hips. Electronically Signed   By: Nelson Chimes M.D.   On: 02/16/2022 12:46   ? ?Procedures ?Procedures  ? ? ?Medications Ordered in ED ?Medications  ?fentaNYL (SUBLIMAZE) injection 50 mcg (50 mcg Intramuscular Given 02/16/22 1251)  ?naproxen (NAPROSYN) tablet 250 mg (250 mg Oral Given 02/16/22 1251)  ?diazepam (VALIUM) tablet 2 mg (2 mg Oral Given 02/16/22 1251)  ?fentaNYL (SUBLIMAZE) injection 50 mcg (50 mcg Intramuscular Given 02/16/22 1448)  ? ? ?ED Course/ Medical Decision Making/ A&P ?  ?                        ?  Medical Decision Making ?Amount and/or Complexity of Data Reviewed ?Radiology: ordered. ? ?Risk ?OTC drugs. ?Prescription drug management. ? ? ?This patient presents to the ED with chief complaint(s) of back pain with pertinent past medical history of chronic arthritis of the spine.  There is no specific trigger for this episode.  Normally patient does not have profound back pain.  There is no history of cancer. ? ?The differential diagnosis includes lytic lesions, pathologic fracture, lumbar sprain, radicular pain to the lumbar spine ? ?The initial plan is to order imaging, lumbar spine and hip x-ray. ? ?Patient has no red flags suggesting cord compression or cauda equina.  She will not need MRI. ? ?She has no abdominal pain, UTI-like symptoms, nausea, vomiting -does not need any lab evaluation. ? ?On exam, patient has reproducible tenderness over the lumbar spine, paraspinal region and positive passive leg raise, which appears to be pointing that this is musculoskeletal pain. ? ? ?Additional history obtained: ?Additional history obtained from family ? ? ?Independent visualization of imaging: ?- Imaging that was independently visualized with scope of interpretation limited to determining acute life threatening conditions related to emergency care: X-ray of the left hip, that did not reveal any clear evidence of fracture-dislocation.  Patient does not have  profound arthritis of the hip either. ?Radiologist reviewed the x-ray of the lumbar spine, there is no pathologic fractures or lytic lesions. ? ?Treatment and Reassessment: ?Patient was given IM fentanyl along with oral V

## 2022-02-18 DIAGNOSIS — G8929 Other chronic pain: Secondary | ICD-10-CM | POA: Diagnosis not present

## 2022-02-18 DIAGNOSIS — Z789 Other specified health status: Secondary | ICD-10-CM | POA: Diagnosis not present

## 2022-02-18 DIAGNOSIS — M5432 Sciatica, left side: Secondary | ICD-10-CM | POA: Diagnosis not present

## 2022-02-18 DIAGNOSIS — I10 Essential (primary) hypertension: Secondary | ICD-10-CM | POA: Diagnosis not present

## 2022-02-18 DIAGNOSIS — M549 Dorsalgia, unspecified: Secondary | ICD-10-CM | POA: Diagnosis not present

## 2022-02-18 DIAGNOSIS — Z299 Encounter for prophylactic measures, unspecified: Secondary | ICD-10-CM | POA: Diagnosis not present

## 2022-02-20 ENCOUNTER — Ambulatory Visit (HOSPITAL_COMMUNITY): Payer: Medicare HMO

## 2022-02-24 ENCOUNTER — Other Ambulatory Visit: Payer: Self-pay

## 2022-02-24 ENCOUNTER — Encounter: Payer: Self-pay | Admitting: Allergy & Immunology

## 2022-02-24 ENCOUNTER — Ambulatory Visit: Payer: Medicare HMO | Admitting: Allergy & Immunology

## 2022-02-24 VITALS — BP 144/76 | HR 83 | Temp 98.4°F | Ht 59.0 in | Wt 121.0 lb

## 2022-02-24 DIAGNOSIS — D591 Autoimmune hemolytic anemia, unspecified: Secondary | ICD-10-CM | POA: Diagnosis not present

## 2022-02-24 DIAGNOSIS — J3089 Other allergic rhinitis: Secondary | ICD-10-CM

## 2022-02-24 MED ORDER — IPRATROPIUM BROMIDE 0.03 % NA SOLN: 2.0000 | Freq: Three times a day (TID) | NASAL | 5 refills | Status: DC

## 2022-02-24 NOTE — Patient Instructions (Addendum)
1. Chronic rhinitis ?- Testing today showed: dust mites. ?- Copy of test results provided.  ?- Avoidance measures provided. ?- We could do more sensitive intradermal testing in the future if these  ?- Continue with: Allegra (fexofenadine) 180mg  tablet once daily ?- Start taking: Atrovent (ipratropium) 0.03% one spray per nostril 2-3 times daily as needed (CAN BE OVER DRYING) ?- You can use an extra dose of the antihistamine, if needed, for breakthrough symptoms.  ?- Consider nasal saline rinses 1-2 times daily to remove allergens from the nasal cavities as well as help with mucous clearance (this is especially helpful to do before the nasal sprays are given) ?- Consider allergy shots as a means of long-term control. ?- Allergy shots "re-train" and "reset" the immune system to ignore environmental allergens and decrease the resulting immune response to those allergens (sneezing, itchy watery eyes, runny nose, nasal congestion, etc).    ?- Allergy shots improve symptoms in 75-85% of patients.  ?- We can discuss more at the next appointment if the medications are not working for you. ? ?2. Return in about 3 months (around 05/27/2022).  ? ? ?Please inform us of any Emergency Department visits, hospitalizations, or changes in symptoms. Call us before going to the ED for breathing or allergy symptoms since we might be able to fit you in for a sick visit. Feel free to contact us anytime with any questions, problems, or concerns. ? ?It was a pleasure to meet you today! You were an absolute delight!  ? ?Websites that have reliable patient information: ?1. American Academy of Asthma, Allergy, and Immunology: www.aaaai.org ?2. Food Allergy Research and Education (FARE): foodallergy.org ?3. Mothers of Asthmatics: http://www.asthmacommunitynetwork.org ?4. [employer] of Allergy, Asthma, and Immunology: MonthlyElectricBill.co.uk ? ? ?COVID-19 Vaccine Information can be found at:  ShippingScam.co.uk For questions related to vaccine distribution or appointments, please email [email or web address] or call [phone].  ? ?We realize that you might be concerned about having an allergic reaction to the COVID19 vaccines. To help with that concern, WE ARE OFFERING THE COVID19 VACCINES IN OUR OFFICE! Ask the front desk for dates!  ? ? ? ??Like? [place] on Facebook and Instagram for our latest updates!  ?  ? ? ?A healthy democracy works best when [employer] participate! Make sure you are registered to vote! If you have moved or changed any of your contact information, you will need to get this updated before voting! ? ?In some cases, you MAY be able to register to vote online: [email or web address] ? ? ? ? Airborne Adult Perc - 02/24/22 1047   ? ? Time Antigen Placed 2409   ? Allergen Manufacturer Lavella Hammock   ? Location Back   ? Number of Test 59   ? 1. Control-Buffer 50% Glycerol Negative   ? 2. Control-Histamine 1 mg/ml 2+   ? 3. Albumin saline Negative   ? 4. Banks Negative   ? 5. [place] Negative   ? 6. Johnson Negative   ? 7. Cascade Blue Negative   ? 8. Meadow Fescue Negative   ? 9. Perennial Rye Negative   ? 10. Sweet Vernal Negative   ? 11. Timothy Negative   ? 12. Cocklebur Negative   ? 13. Burweed Marshelder Negative   ? 14. Ragweed, short Negative   ? 15. Ragweed, Giant Negative   ? 16. Plantain,  English Negative   ? 17. Lamb's Quarters Negative   ? 18. Sheep Sorrell Negative   ? 19. Rough Pigweed Negative   ?  27. Marsh Elder, Rough Negative   ? 21. Mugwort, Common Negative   ? 22. Ash mix Negative   ? 23. Wendee Copp mix Negative   ? 24. Beech American Negative   ? 25. Box, Elder Negative   ? 26. Cedar, red Negative   ? 27. Cottonwood, [place] Negative   ? 28. Elm mix Negative   ? 29. Hickory Negative   ? 30. Maple mix Negative   ? 31. Oak, [place] mix Negative   ? 32. Pecan Pollen Negative   ? 33. Pine mix  Negative   ? 34. Sycamore Eastern Negative   ? 35. Walnut, Black Pollen Negative   ? 36. Alternaria alternata Negative   ? 28. Cladosporium Herbarum Negative   ? 38. Aspergillus mix Negative   ? 39. Penicillium mix Negative   ? 40. Bipolaris sorokiniana (Helminthosporium) Negative   ? 41. Drechslera spicifera (Curvularia) Negative   ? 42. Mucor plumbeus Negative   ? 43. Fusarium moniliforme Negative   ? 44. Aureobasidium pullulans (pullulara) Negative   ? 45. Rhizopus oryzae Negative   ? 46. Botrytis cinera Negative   ? 47. Epicoccum nigrum Negative   ? 48. Phoma betae Negative   ? 49. Candida Albicans Negative   ? 50. Trichophyton mentagrophytes Negative   ? 51. Mite, D Farinae  5,000 AU/ml Negative   ? 52. Mite, D Pteronyssinus  5,000 AU/ml 2+   ? 53. Cat Hair 10,000 BAU/ml Negative   ? 54.  Dog Epithelia Negative   ? 55. Mixed Feathers Negative   ? 56. Horse Epithelia Negative   ? 57. Cockroach, [place] Negative   ? 58. Mouse Negative   ? 59. Tobacco Leaf Negative   ? ?  ?  ? ?  ? ? ?Control of Dust Mite Allergen ? ? ? ?Dust mites play a major role in allergic asthma and rhinitis.  They occur in environments with high humidity wherever human skin is found.  Dust mites absorb humidity from the atmosphere (ie, they do not drink) and feed on organic matter (including shed human and animal skin).  Dust mites are a microscopic type of insect that you cannot see with the naked eye.  High levels of dust mites have been detected from mattresses, pillows, carpets, upholstered furniture, bed covers, clothes, soft toys and any woven material.  The principal allergen of the dust mite is found in its feces.  A gram of dust may contain 1,000 mites and 250,000 fecal particles.  Mite antigen is easily measured in the air during house cleaning activities.  Dust mites do not bite and do not cause harm to humans, other than by triggering allergies/asthma. ?   ?Ways to decrease your exposure to dust mites in your home:  ?Encase  mattresses, box springs and pillows with a mite-impermeable barrier or cover   ?Wash sheets, blankets and drapes weekly in hot water (130? F) with detergent and dry them in a dryer on the hot setting.  ?Have the room cleaned frequently with a vacuum cleaner and a damp dust-mop.  For carpeting or rugs, vacuuming with a vacuum cleaner equipped with a high-efficiency particulate air (HEPA) filter.  The dust mite allergic individual should not be in a room which is being cleaned and should wait 1 hour after cleaning before going into the room. ?Do not sleep on upholstered furniture (eg, couches).   ?If possible removing carpeting, upholstered furniture and drapery from the home is ideal.  Horizontal blinds should be  eliminated in the rooms where the person spends the most time (bedroom, study, television room).  Washable vinyl, roller-type shades are optimal. ?Remove all non-washable stuffed toys from the bedroom.  Wash stuffed toys weekly like sheets and blankets above.   ?Reduce indoor humidity to less than 50%.  Inexpensive humidity monitors can be purchased at most hardware stores.  Do not use a humidifier as can make the problem worse and are not recommended. ? ? ? ? ? ? ? ? ?

## 2022-02-24 NOTE — Progress Notes (Addendum)
? ?NEW PATIENT ? ?Date of Service/Encounter:  02/24/22 ? ?Consult requested by: [name], MD ? ? ?Assessment:  ? ?Perennial allergic rhinitis (dust mites) ? 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 ?Beta thalassemia trait ? 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 ? ? ?Plan/Recommendations:  ? ?1. Chronic rhinitis ?- Testing today showed: dust mites. ?- Copy of test results provided.  ?- Avoidance measures provided. ?- We could do more sensitive intradermal testing in the future if these medications do not help.  ?- Continue with: Allegra (fexofenadine) 180mg  tablet once daily ?- Start taking: Atrovent (ipratropium) 0.03% one spray per nostril 2-3 times daily as needed (CAN BE OVER DRYING) ?- You can use an extra dose of the antihistamine, if needed, for breakthrough symptoms.  ?- Consider nasal saline rinses 1-2 times daily to remove allergens from the nasal cavities as well as help with mucous clearance (this is especially helpful to do before the nasal sprays are given) ?- Consider allergy shots as a means of long-term control. ?- Allergy shots "re-train" and "reset" the immune system to ignore environmental allergens and decrease the resulting immune response to those allergens (sneezing, itchy watery eyes, runny nose, nasal congestion, etc).    ?- Allergy shots improve symptoms in 75-85% of patients.  ?- We can discuss more at the next appointment if the medications are not working for you. ? ?2. Return in about 3 months (around 05/27/2022).  ? ?This note in its entirety was forwarded to the Provider who requested this consultation. ? ?Subjective:  ? ?Dawn Lin is a 81 y.o. female presenting today for evaluation of  ?Chief Complaint  ?Patient presents with  ? Allergies  ?  Nasal drainage down the throat, croupy coughs, throat gets irritated, watery eyes,  ?Has taken Allegra, mucinex, nasal saline for it. No anti-histamines these past 3 days.     ? ? ?Dawn Lin has a history of the following: ?Patient Active Problem List  ? Diagnosis Date Noted  ? Hemolytic anemia ([place]) 01/24/2022  ? GERD (gastroesophageal reflux disease) 12/13/2020  ? Retrosternal discomfort 09/10/2020  ? Adenocarcinoma of lung, stage 1, right ([place]) 08/12/2018  ? Allergic dermatitis 08/12/2018  ? Anemia 08/12/2018  ? Anxiety and depression 08/12/2018  ? Carotid bruit 08/12/2018  ? Gait instability 08/12/2018  ? Eustachian tube dysfunction 08/12/2018  ? Effusion of prepatellar bursa, right 08/12/2018  ? Fatigue 08/12/2018  ? Double vision with both eyes open 08/12/2018  ? Dizziness 08/12/2018  ? Cough 08/12/2018  ? Urine frequency 08/12/2018  ? Solitary pulmonary nodule 08/12/2018  ? 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 Oral candidiasis 08/12/2018  ? Mammogram declined 08/12/2018  ? Inguinal hernia, bilateral 08/12/2018  ? Vitamin D deficiency 08/12/2018  ? Coronary artery calcification 08/01/2017  ? Shortness of breath 08/01/2017  ? Atypical chest pain 08/01/2017  ? S/P lobectomy of lung 04/20/2017  ? Malignant neoplasm of right upper lobe of lung ([place]) 04/13/2017  ? Arthralgia of right temporomandibular joint 01/23/2016  ? Seasonal allergic rhinitis 01/23/2016  ? Low back pain 08/12/2011  ? Sciatica 07/28/2011  ? Nail fungus 07/28/2011  ? Preventative health care 06/10/2011  ? Grief 04/08/2011  ? Hyperlipidemia 11/26/2010  ? BACK PAIN 09/03/2010  ? Other thalassemia ([place]) 11/27/2009  ? Essential hypertension 11/27/2009  ? NECK PAIN 11/27/2009  ? Other osteoporosis 11/27/2009  ? ? ?History obtained from: chart review and patient. ? ?Dawn Lin was  referred by [name], MD.    ? ?Dawn Lin is a 81 y.o. female presenting for an evaluation of environmental allergies . ? ?She presents today for mucous production that drains and makes her coughing worse.  She has never had wheezing.  She has never been diagnosed with asthma. ? ?Allergic Rhinitis Symptom  History: She has not been on medicated nose sprays. She has had symptoms for 2-3 years in total. She has dealt with it.  It has been worse lately and she has been coughing a lot. Mucinex and Allegra does take the edge off, but they do not stop it. No environments seem to make it worse. She has a dog that is hort hair and she has had for 10-12 years; this has not worsened it at all.  ? ?She did not get antibiotics for sinus infections at all.  She does have a history of autoimmune hemolytic anemia.  She is currently being weaned off of her prednisone.  She has now been treated with 4 doses of rituximab.  This has been very helpful.  She has been able to wean her prednisone to 5 mg daily.  She apparently also has a history of hereditary beta thalassemia trait.  She has a low baseline hemoglobin.   ? ?She has a history of right upper lobe adenocarcinoma of her lung stage I.  She had a lobectomy in July 2018.  She gets annual chest CTs, thus far no recurrence of her disease.  She sees Dr. Delton Coombes here in [place]. ? ?Otherwise, there is no history of other atopic diseases, including asthma, food allergies, drug allergies, stinging insect allergies, eczema, urticaria, or contact dermatitis. There is no significant infectious history. Vaccinations are up to date.  ? ? ?Past Medical History: ?Patient Active Problem List  ? Diagnosis Date Noted  ? Hemolytic anemia ([place]) 01/24/2022  ? GERD (gastroesophageal reflux disease) 12/13/2020  ? Retrosternal discomfort 09/10/2020  ? Adenocarcinoma of lung, stage 1, right ([place]) 08/12/2018  ? Allergic dermatitis 08/12/2018  ? Anemia 08/12/2018  ? Anxiety and depression 08/12/2018  ? Carotid bruit 08/12/2018  ? Gait instability 08/12/2018  ? Eustachian tube dysfunction 08/12/2018  ? Effusion of prepatellar bursa, right 08/12/2018  ? Fatigue 08/12/2018  ? Double vision with both eyes open 08/12/2018  ? Dizziness 08/12/2018  ? Cough 08/12/2018  ? Urine frequency 08/12/2018  ?  Solitary pulmonary nodule 08/12/2018  ? 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 Oral candidiasis 08/12/2018  ? Mammogram declined 08/12/2018  ? Inguinal hernia, bilateral 08/12/2018  ? Vitamin D deficiency 08/12/2018  ? Coronary artery calcification 08/01/2017  ? Shortness of breath 08/01/2017  ? Atypical chest pain 08/01/2017  ? S/P lobectomy of lung 04/20/2017  ? Malignant neoplasm of right upper lobe of lung ([place]) 04/13/2017  ? Arthralgia of right temporomandibular joint 01/23/2016  ? Seasonal allergic rhinitis 01/23/2016  ? Low back pain 08/12/2011  ? Sciatica 07/28/2011  ? Nail fungus 07/28/2011  ? Preventative health care 06/10/2011  ? Grief 04/08/2011  ? Hyperlipidemia 11/26/2010  ? BACK PAIN 09/03/2010  ? Other thalassemia ([place]) 11/27/2009  ? Essential hypertension 11/27/2009  ? NECK PAIN 11/27/2009  ? Other osteoporosis 11/27/2009  ? ? ?Medication List:  ?Allergies as of 02/24/2022   ? ?   Reactions  ? Lisinopril Cough  ? ?  ? ?  ?Medication List  ?  ? ?  ? Accurate as of Feb 24, 2022 12:55 PM. If you have any questions, ask your nurse  or doctor.  ?  ?  ? ?  ? ?acetaminophen 500 MG tablet ?Commonly known as: TYLENOL ?Take 1 tablet (500 mg total) by mouth every 6 (six) hours as needed. ?  ?amLODipine 10 MG tablet ?Commonly known as: NORVASC ?Take 10 mg by mouth daily. ?  ?aspirin EC 81 MG tablet ?Take 81 mg by mouth daily. ?  ?BEANO PO ?Take 1 capsule by mouth daily as needed (bloating). ?  ?benzonatate 100 MG capsule ?Commonly known as: TESSALON ?Take 1 capsule (100 mg total) by mouth 3 (three) times daily as needed for cough. ?  ?Calcium-Phosphorus-Vitamin D 250-115-250 MG-MG-UNIT Chew ?Chew 1 each by mouth 2 (two) times daily. ?  ?escitalopram 10 MG tablet ?Commonly known as: LEXAPRO ?Take 10 mg by mouth daily. ?  ?fexofenadine 180 MG tablet ?Commonly known as: ALLEGRA ?Take 180 mg by mouth daily as needed for allergies. ?  ?folic acid 1 MG tablet ?Commonly known as: FOLVITE ?Take 1 mg by mouth daily. ?   ?guaiFENesin 600 MG 12 hr tablet ?Commonly known as: [place] ?Take 600 mg by mouth 2 (two) times daily as needed for cough or to loosen phlegm. ?  ?hydrochlorothiazide 25 MG tablet ?Commonly known as: HYDRODIUR

## 2022-02-25 NOTE — Addendum Note (Signed)
Addended by: Larence Penning on: 02/25/2022 05:38 PM ? ? Modules accepted: Orders ? ?

## 2022-02-26 DIAGNOSIS — M545 Low back pain, unspecified: Secondary | ICD-10-CM | POA: Diagnosis not present

## 2022-02-26 DIAGNOSIS — Z6824 Body mass index (BMI) 24.0-24.9, adult: Secondary | ICD-10-CM | POA: Diagnosis not present

## 2022-02-26 DIAGNOSIS — Z299 Encounter for prophylactic measures, unspecified: Secondary | ICD-10-CM | POA: Diagnosis not present

## 2022-03-03 DIAGNOSIS — M546 Pain in thoracic spine: Secondary | ICD-10-CM | POA: Diagnosis not present

## 2022-03-03 DIAGNOSIS — M5136 Other intervertebral disc degeneration, lumbar region: Secondary | ICD-10-CM | POA: Diagnosis not present

## 2022-03-03 DIAGNOSIS — M9903 Segmental and somatic dysfunction of lumbar region: Secondary | ICD-10-CM | POA: Diagnosis not present

## 2022-03-03 DIAGNOSIS — M9902 Segmental and somatic dysfunction of thoracic region: Secondary | ICD-10-CM | POA: Diagnosis not present

## 2022-03-03 DIAGNOSIS — M9905 Segmental and somatic dysfunction of pelvic region: Secondary | ICD-10-CM | POA: Diagnosis not present

## 2022-03-03 DIAGNOSIS — M5442 Lumbago with sciatica, left side: Secondary | ICD-10-CM | POA: Diagnosis not present

## 2022-03-06 ENCOUNTER — Encounter (HOSPITAL_COMMUNITY): Payer: Self-pay | Admitting: Hematology

## 2022-03-06 ENCOUNTER — Inpatient Hospital Stay (HOSPITAL_COMMUNITY): Payer: Medicare HMO

## 2022-03-06 ENCOUNTER — Inpatient Hospital Stay (HOSPITAL_COMMUNITY): Payer: Medicare HMO | Attending: Hematology | Admitting: Hematology

## 2022-03-06 VITALS — BP 147/71 | HR 79 | Temp 97.1°F | Resp 18 | Ht 59.0 in | Wt 123.5 lb

## 2022-03-06 DIAGNOSIS — Z9221 Personal history of antineoplastic chemotherapy: Secondary | ICD-10-CM | POA: Diagnosis not present

## 2022-03-06 DIAGNOSIS — R7989 Other specified abnormal findings of blood chemistry: Secondary | ICD-10-CM | POA: Insufficient documentation

## 2022-03-06 DIAGNOSIS — D591 Autoimmune hemolytic anemia, unspecified: Secondary | ICD-10-CM | POA: Diagnosis not present

## 2022-03-06 DIAGNOSIS — D590 Drug-induced autoimmune hemolytic anemia: Secondary | ICD-10-CM

## 2022-03-06 DIAGNOSIS — Z79899 Other long term (current) drug therapy: Secondary | ICD-10-CM | POA: Diagnosis not present

## 2022-03-06 LAB — CBC WITH DIFFERENTIAL/PLATELET
Abs Immature Granulocytes: 0.07 10*3/uL (ref 0.00–0.07)
Basophils Absolute: 0.1 10*3/uL (ref 0.0–0.1)
Basophils Relative: 1 %
Eosinophils Absolute: 0.1 10*3/uL (ref 0.0–0.5)
Eosinophils Relative: 1 %
HCT: 33.7 % — ABNORMAL LOW (ref 36.0–46.0)
Hemoglobin: 10.7 g/dL — ABNORMAL LOW (ref 12.0–15.0)
Immature Granulocytes: 1 %
Lymphocytes Relative: 7 %
Lymphs Abs: 0.7 10*3/uL (ref 0.7–4.0)
MCH: 21.6 pg — ABNORMAL LOW (ref 26.0–34.0)
MCHC: 31.8 g/dL (ref 30.0–36.0)
MCV: 68.1 fL — ABNORMAL LOW (ref 80.0–100.0)
Monocytes Absolute: 0.9 10*3/uL (ref 0.1–1.0)
Monocytes Relative: 8 %
Neutro Abs: 9.1 10*3/uL — ABNORMAL HIGH (ref 1.7–7.7)
Neutrophils Relative %: 82 %
Platelets: 305 10*3/uL (ref 150–400)
RBC: 4.95 MIL/uL (ref 3.87–5.11)
RDW: 15.9 % — ABNORMAL HIGH (ref 11.5–15.5)
WBC: 11 10*3/uL — ABNORMAL HIGH (ref 4.0–10.5)
nRBC: 0.2 % (ref 0.0–0.2)

## 2022-03-06 LAB — COMPREHENSIVE METABOLIC PANEL
ALT: 9 U/L (ref 0–44)
AST: 22 U/L (ref 15–41)
Albumin: 4.4 g/dL (ref 3.5–5.0)
Alkaline Phosphatase: 54 U/L (ref 38–126)
Anion gap: 5 (ref 5–15)
BUN: 21 mg/dL (ref 8–23)
CO2: 28 mmol/L (ref 22–32)
Calcium: 9 mg/dL (ref 8.9–10.3)
Chloride: 103 mmol/L (ref 98–111)
Creatinine, Ser: 0.66 mg/dL (ref 0.44–1.00)
GFR, Estimated: >60 mL/min (ref >60)
Glucose, Bld: 101 mg/dL — ABNORMAL HIGH (ref 70–99)
Potassium: 3.4 mmol/L — ABNORMAL LOW (ref 3.5–5.1)
Sodium: 136 mmol/L (ref 135–145)
Total Bilirubin: 2 mg/dL — ABNORMAL HIGH (ref 0.3–1.2)
Total Protein: 6.5 g/dL (ref 6.5–8.1)

## 2022-03-06 LAB — LACTATE DEHYDROGENASE: LDH: 165 U/L (ref 98–192)

## 2022-03-06 NOTE — Patient Instructions (Addendum)
[place] at [facility] Discharge Instructions   You were seen and examined today by Dr. Delton Coombes.  He reviewed the results of the lab work that was done at your last visit.   We will repeat some basic lab work today.   We will repeat a CT scan of your chest prior to your next visit.  Return as scheduled in 3 months.    Thank you for choosing [place] at [facility] to provide your oncology and hematology care.  To afford each patient quality time with our provider, please arrive at least 15 minutes before your scheduled appointment time.   If you have a lab appointment with the [facility] please come in thru the Main Entrance and check in at the main information desk.  You need to re-schedule your appointment should you arrive 10 or more minutes late.  We strive to give you quality time with our providers, and arriving late affects you and other patients whose appointments are after yours.  Also, if you no show three or more times for appointments you may be dismissed from the clinic at the providers discretion.     Again, thank you for choosing [facility].  Our hope is that these requests will decrease the amount of time that you wait before being seen by our physicians.       _____________________________________________________________  Should you have questions after your visit to [facility], please contact our office at [phone] and follow the prompts.  Our office hours are 8:00 a.m. and 4:30 p.m. Monday - Friday.  Please note that voicemails left after 4:00 p.m. may not be returned until the following business day.  We are closed weekends and major holidays.  You do have access to a nurse 24-7, just call the main number to the clinic [phone] and do not press any options, hold on the line and a nurse will answer the phone.    For prescription refill requests, have your pharmacy contact our  office and allow 72 hours.    Due to Covid, you will need to wear a mask upon entering the hospital. If you do not have a mask, a mask will be given to you at the Main Entrance upon arrival. For doctor visits, patients may have 1 support person age 41 or older with them. For treatment visits, patients can not have anyone with them due to social distancing guidelines and our immunocompromised population.

## 2022-03-06 NOTE — Progress Notes (Signed)
[place], [place] [address]   CLINIC:  Medical Oncology/Hematology  PCP:  [name], MD [address] / [place] [address]  [phone]  REASON FOR VISIT:  Follow-up for autoimmune hemolytic anemia  PRIOR THERAPY: none  CURRENT THERAPY: surveillance  INTERVAL HISTORY:  Dawn Lin, a 81 y.o. female, returns for routine follow-up for Dawn Lin autoimmune hemolytic anemia. Dawn Lin was last seen on 01/24/2022.  Today Dawn Lin reports feeling good, and Dawn Lin is accompanied by Dawn Lin sister. Dawn Lin reports sciatica pain in Dawn Lin left leg starting 3 weeks ago for which Dawn Lin has started prednisone. Dawn Lin appetite is good.   REVIEW OF SYSTEMS:  Review of Systems  Constitutional:  Negative for appetite change.  Neurological:  Positive for numbness (L leg).  All other systems reviewed and are negative.  PAST MEDICAL/SURGICAL HISTORY:  Past Medical History:  Diagnosis Date   Anemia    Anxiety    Dyspnea    with exertion   Essential hypertension, benign since the 1900's   Family history of adverse reaction to anesthesia    Children - N/V   History of blood transfusion    Hyperlipidemia    Other osteoporosis    Other thalassemia ([place])    Past Surgical History:  Procedure Laterality Date   BIOPSY  12/28/2020   Procedure: BIOPSY;  Surgeon: Harvel Quale, MD;  [facility];  Service: Gastroenterology;;   COLONOSCOPY     COLONOSCOPY WITH PROPOFOL N/A 12/28/2020   Procedure: COLONOSCOPY WITH PROPOFOL;  Surgeon: Harvel Quale, MD;  [facility];  Service: Gastroenterology;  Laterality: N/A;  AM   ESOPHAGOGASTRODUODENOSCOPY (EGD) WITH PROPOFOL N/A 12/28/2020   Procedure: ESOPHAGOGASTRODUODENOSCOPY (EGD) WITH PROPOFOL;  Surgeon: Harvel Quale, MD;  [facility];  Service: Gastroenterology;  Laterality: N/A;   INGUINAL HERNIA REPAIR Left    LUMBAR [place] SURGERY  1990's   for severe  pain and sciatica   PARTIAL HYSTERECTOMY  1970's   for bleeding and prolapse   VIDEO ASSISTED THORACOSCOPY (VATS)/ LOBECTOMY Right 04/20/2017   Procedure: VIDEO ASSISTED THORACOSCOPY (VATS)/RIGHT UPPER LOBECTOMY;  Surgeon: Grace Isaac, MD;  Location: [facility];  Service: Thoracic;  Laterality: Right;   VIDEO BRONCHOSCOPY N/A 04/20/2017   Procedure: VIDEO BRONCHOSCOPY;  Surgeon: Grace Isaac, MD;  Location[facility];  Service: Thoracic;  Laterality: N/A;    SOCIAL HISTORY:  Social History   Socioeconomic History   Marital status: Widowed    Spouse name: Not on file   Number of children: Not on file   Years of education: Not on file   Highest education level: Not on file  Occupational History   Occupation: retired  Tobacco Use   Smoking status: Never   Smokeless tobacco: Never  Vaping Use   Vaping Use: Never used  Substance and Sexual Activity   Alcohol use: No   Drug use: No   Sexual activity: Never  Other Topics Concern   Not on file  Social History Narrative   [name] is married. Dawn Lin husband has been chronically ill for 6 years (2011). He has metastatic prostate cancer and multiple myeloma. 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 Dawn Lin has a lot of family support.    Social Determinants of Health   Financial Resource Strain: Not on file  Food Insecurity: Not on file  Transportation Needs: Not on file  Physical Activity: Not  on file  Stress: Not on file  Social Connections: Not on file  Intimate Partner Violence: Not on file    FAMILY HISTORY:  Family History  Problem Relation Age of Onset   Hypertension Mother    Asthma Sister    Heart attack Sister 52    CURRENT MEDICATIONS:  Current Outpatient Medications  Medication Sig Dispense Refill   acetaminophen (TYLENOL) 500 MG tablet Take 1 tablet (500 mg total) by mouth every 6 (six) hours as needed. 30 tablet 0   Alpha-D-Galactosidase (BEANO PO) Take 1 capsule by mouth daily as needed  (bloating).     amLODipine (NORVASC) 10 MG tablet Take 10 mg by mouth daily.     Ascorbic Acid (VITAMIN C) 500 MG CHEW Chew 500 mg by mouth daily.     aspirin EC 81 MG tablet Take 81 mg by mouth daily.     benzonatate (TESSALON) 100 MG capsule Take 1 capsule (100 mg total) by mouth 3 (three) times daily as needed for cough. 30 capsule 0   Calcium-Phosphorus-Vitamin D 545-625-638 MG-MG-UNIT CHEW Chew 1 each by mouth 2 (two) times daily.      escitalopram (LEXAPRO) 10 MG tablet Take 10 mg by mouth daily. (Patient not taking: Reported on 02/24/2022)     fexofenadine (ALLEGRA) 180 MG tablet Take 180 mg by mouth daily as needed for allergies.     folic acid (FOLVITE) 1 MG tablet Take 1 mg by mouth daily.     guaiFENesin (MUCINEX) 600 MG 12 hr tablet Take 600 mg by mouth 2 (two) times daily as needed for cough or to loosen phlegm.     hydrochlorothiazide (HYDRODIURIL) 25 MG tablet Take 1 tablet (25 mg total) by mouth daily. 93 tablet 4   ibuprofen (ADVIL) 200 MG tablet Take 200 mg by mouth every 6 (six) hours as needed for moderate pain.     ipratropium (ATROVENT) 0.03 % nasal spray Place 2 sprays into both nostrils 3 (three) times daily. 30 mL 5   meclizine (ANTIVERT) 12.5 MG tablet Take 12.5 mg by mouth every 6 (six) hours as needed for dizziness.     Multiple Vitamin (MULTIVITAMIN WITH MINERALS) TABS tablet Take 1 tablet by mouth daily.     naproxen (NAPROSYN) 250 MG tablet Take 1 tablet (250 mg total) by mouth 2 (two) times daily with a meal. (Patient not taking: Reported on 02/24/2022) 10 tablet 0   Omega-3 1000 MG CAPS Take by mouth. (Patient not taking: Reported on 02/24/2022)     oxyCODONE-acetaminophen (PERCOCET/ROXICET) 5-325 MG tablet Take 1 tablet by mouth every 12 (twelve) hours as needed for severe pain. 6 tablet 0   predniSONE (DELTASONE) 5 MG tablet Take by mouth.     rosuvastatin (CRESTOR) 5 MG tablet TAKE 1 TABLET BY MOUTH EVERY DAY 90 tablet 3   simethicone (MYLICON) 80 MG chewable  tablet Chew 80 mg by mouth every 6 (six) hours as needed for flatulence.     tizanidine (ZANAFLEX) 2 MG capsule Take 1 capsule (2 mg total) by mouth 3 (three) times daily as needed for muscle spasms. (Patient not taking: Reported on 02/24/2022) 15 capsule 0   No current facility-administered medications for this visit.    ALLERGIES:  Allergies  Allergen Reactions   Lisinopril Cough    PHYSICAL EXAM:  Performance status (ECOG): 1 - Symptomatic but completely ambulatory  There were no vitals filed for this visit. Wt Readings from Last 3 Encounters:  02/24/22 121 lb (54.9 kg)  02/16/22 121 lb (54.9 kg)  01/24/22 122 lb 6.4 oz (55.5 kg)   Physical Exam Vitals reviewed.  Constitutional:      Appearance: Normal appearance.  Cardiovascular:     Rate and Rhythm: Normal rate and regular rhythm.     Pulses: Normal pulses.     Heart sounds: Murmur heard.  Systolic murmur is present.  Pulmonary:     Effort: Pulmonary effort is normal.     Breath sounds: Normal breath sounds.  Abdominal:     Palpations: Abdomen is soft. There is no hepatomegaly, splenomegaly or mass.     Tenderness: There is no abdominal tenderness.  Musculoskeletal:     Right lower leg: No edema.     Left lower leg: No edema.  Neurological:     General: No focal deficit present.     Mental Status: Dawn Lin is alert and oriented to person, place, and time.  Psychiatric:        Mood and Affect: Mood normal.        Behavior: Behavior normal.    LABORATORY DATA:  I have reviewed the labs as listed.     Latest Ref Rng & Units 01/24/2022   10:53 AM 12/13/2020    2:31 PM 04/29/2017    2:40 AM  CBC  WBC 4.0 - 10.5 K/uL 12.4   7.6   8.5    Hemoglobin 12.0 - 15.0 g/dL 10.5   9.1   7.4    Hematocrit 36.0 - 46.0 % 31.3   25.8   22.3    Platelets 150 - 400 K/uL 301   398   293        Latest Ref Rng & Units 12/26/2020    4:45 PM 12/26/2020   11:33 AM 02/18/2018    9:03 AM  CMP  Glucose 70 - 99 mg/dL  89     BUN 8 - 23  mg/dL  17     Creatinine 1.71 - 1.00 mg/dL  0.78     Sodium 502 - 145 mmol/L  135     Potassium 3.5 - 5.1 mmol/L 3.3   5.8     Chloride 98 - 111 mmol/L  99     CO2 22 - 32 mmol/L  27     Calcium 8.9 - 10.3 mg/dL  9.0     ALT 0 - 32 IU/L   21        Component Value Date/Time   RBC 4.54 01/24/2022 1054   RBC 4.54 01/24/2022 1053   MCV 68.9 (L) 01/24/2022 1053   MCH 22.0 (L) 01/24/2022 1053   MCHC 31.9 01/24/2022 1053   RDW 15.2 01/24/2022 1053   LYMPHSABS 0.7 01/24/2022 1053   MONOABS 0.8 01/24/2022 1053   EOSABS 0.1 01/24/2022 1053   BASOSABS 0.1 01/24/2022 1053    DIAGNOSTIC IMAGING:  I have independently reviewed the scans and discussed with the patient. DG Lumbar Spine Complete  Result Date: 02/16/2022 CLINICAL DATA:  Worsening low back pain beginning 2 days ago. EXAM: LUMBAR SPINE - COMPLETE 4+ VIEW COMPARISON:  None Available. FINDINGS: There is no evidence of lumbar spine fracture. Moderate severe degenerative disc disease and bilateral facet DJD is seen at L4-5 and L5-S1. Grade 2 anterolisthesis seen at L5-S1 measuring 12 mm, likely degenerative in etiology. IMPRESSION: No acute findings. 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 Electronically Signed   By: Danae Orleans M.D.   On: 02/16/2022 12:46  DG Pelvis 1-2 Views  Result Date: 02/16/2022 CLINICAL DATA:  Left back, hip and thigh pain beginning 2 days ago. EXAM: PELVIS - 1-2 VIEW COMPARISON:  CT 03/18/2017 FINDINGS: Bones of the pelvis are normal. No joint space narrowing of either hip joint. No sign of fracture or focal bone lesion. Lower lumbar degenerative changes are noted. IMPRESSION: No abnormality of the pelvis or hips. Electronically Signed   By: Nelson Chimes M.D.   On: 02/16/2022 12:46     ASSESSMENT:  Autoimmune hemolytic anemia: - Diagnosed with AIHA with positive DAT on 09/24/2021. - Status post rituximab weekly x4 completed on 11/28/2021.  Dawn Lin had relapse  of hemolysis when prednisone was discontinued.  Currently on prednisone 5 mg daily treated by Dr Erven Colla at [facility].  Dawn Lin is switching care due to proximity. - Dawn Lin also has hereditary beta thalassemia trait (11/13/2020 hemoglobin electrophoresis: HbA-91.8%, HbA2-4.9%, HbF-3.3%) and reports Dawn Lin normal hemoglobin is between 10 and 11.    Social/family history: - Dawn Lin lives by herself at home.  Dawn Lin retired from office job.  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  Dawn Lin half-sister is [place] who is our patient.  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  Dawn Lin has exposure to [place] [address] and other fertilizers.  Dawn Lin was never smoker. - Half sister has breast cancer.  Does not know Dawn Lin paternal side of the family.   PLAN:  Autoimmune hemolytic anemia: - We have discussed the results from 01/24/2022 which showed normal B12, folate, MMA and copper.  LDH was minimally elevated indicating ongoing hemolysis. - We have checked Dawn Lin labs today with hemoglobin 10.7 and stable.  LDH normalized.  Reticulocyte count was 3.5%.  Direct antiglobulin test was positive for complement and negative for IgG.  Haptoglobin was undetectable. - Dawn Lin reportedly had 6-day prednisone taper for sciatica pain. - Continue maintenance prednisone 5 mg daily.  We will plan to repeat labs in 3 months.  2.  Elevated ferritin: - Dawn Lin had normal ferritin levels until August 2022.  Dawn Lin received 2 infusions of iron.  Last ferritin was 569 with percent saturation 56.  3.  Stage I (PT1APN0) right upper lobe adenocarcinoma: - Status post right upper lobectomy and lymph node biopsies on 04/20/2017. - Last CT chest on 04/30/2021 with no evidence of recurrence. - We will schedule CT chest without contrast in July.  That will be Dawn Lin last scan.  Orders placed this encounter:  No orders of the defined types were placed in this encounter.    Derek Jack, MD [place] [phone]   I, Thana Ates, am acting as a scribe for Dr. Derek Jack.  I, Derek Jack MD, have reviewed the above documentation for accuracy and completeness, and I agree with the above.

## 2022-03-07 DIAGNOSIS — M9903 Segmental and somatic dysfunction of lumbar region: Secondary | ICD-10-CM | POA: Diagnosis not present

## 2022-03-07 DIAGNOSIS — M5442 Lumbago with sciatica, left side: Secondary | ICD-10-CM | POA: Diagnosis not present

## 2022-03-07 DIAGNOSIS — M5136 Other intervertebral disc degeneration, lumbar region: Secondary | ICD-10-CM | POA: Diagnosis not present

## 2022-03-07 DIAGNOSIS — M9905 Segmental and somatic dysfunction of pelvic region: Secondary | ICD-10-CM | POA: Diagnosis not present

## 2022-03-07 DIAGNOSIS — M9902 Segmental and somatic dysfunction of thoracic region: Secondary | ICD-10-CM | POA: Diagnosis not present

## 2022-03-07 DIAGNOSIS — M546 Pain in thoracic spine: Secondary | ICD-10-CM | POA: Diagnosis not present

## 2022-03-11 DIAGNOSIS — M5136 Other intervertebral disc degeneration, lumbar region: Secondary | ICD-10-CM | POA: Diagnosis not present

## 2022-03-11 DIAGNOSIS — M546 Pain in thoracic spine: Secondary | ICD-10-CM | POA: Diagnosis not present

## 2022-03-11 DIAGNOSIS — M9902 Segmental and somatic dysfunction of thoracic region: Secondary | ICD-10-CM | POA: Diagnosis not present

## 2022-03-11 DIAGNOSIS — M5442 Lumbago with sciatica, left side: Secondary | ICD-10-CM | POA: Diagnosis not present

## 2022-03-11 DIAGNOSIS — M9903 Segmental and somatic dysfunction of lumbar region: Secondary | ICD-10-CM | POA: Diagnosis not present

## 2022-03-11 DIAGNOSIS — M9905 Segmental and somatic dysfunction of pelvic region: Secondary | ICD-10-CM | POA: Diagnosis not present

## 2022-03-13 ENCOUNTER — Ambulatory Visit (HOSPITAL_COMMUNITY): Payer: Medicare HMO

## 2022-03-14 DIAGNOSIS — M546 Pain in thoracic spine: Secondary | ICD-10-CM | POA: Diagnosis not present

## 2022-03-14 DIAGNOSIS — M9902 Segmental and somatic dysfunction of thoracic region: Secondary | ICD-10-CM | POA: Diagnosis not present

## 2022-03-14 DIAGNOSIS — M9903 Segmental and somatic dysfunction of lumbar region: Secondary | ICD-10-CM | POA: Diagnosis not present

## 2022-03-14 DIAGNOSIS — M5442 Lumbago with sciatica, left side: Secondary | ICD-10-CM | POA: Diagnosis not present

## 2022-03-14 DIAGNOSIS — M5136 Other intervertebral disc degeneration, lumbar region: Secondary | ICD-10-CM | POA: Diagnosis not present

## 2022-03-14 DIAGNOSIS — M9905 Segmental and somatic dysfunction of pelvic region: Secondary | ICD-10-CM | POA: Diagnosis not present

## 2022-03-17 DIAGNOSIS — M546 Pain in thoracic spine: Secondary | ICD-10-CM | POA: Diagnosis not present

## 2022-03-17 DIAGNOSIS — M9905 Segmental and somatic dysfunction of pelvic region: Secondary | ICD-10-CM | POA: Diagnosis not present

## 2022-03-17 DIAGNOSIS — M5136 Other intervertebral disc degeneration, lumbar region: Secondary | ICD-10-CM | POA: Diagnosis not present

## 2022-03-17 DIAGNOSIS — M5442 Lumbago with sciatica, left side: Secondary | ICD-10-CM | POA: Diagnosis not present

## 2022-03-17 DIAGNOSIS — M9902 Segmental and somatic dysfunction of thoracic region: Secondary | ICD-10-CM | POA: Diagnosis not present

## 2022-03-17 DIAGNOSIS — M9903 Segmental and somatic dysfunction of lumbar region: Secondary | ICD-10-CM | POA: Diagnosis not present

## 2022-03-19 DIAGNOSIS — M546 Pain in thoracic spine: Secondary | ICD-10-CM | POA: Diagnosis not present

## 2022-03-19 DIAGNOSIS — M9903 Segmental and somatic dysfunction of lumbar region: Secondary | ICD-10-CM | POA: Diagnosis not present

## 2022-03-19 DIAGNOSIS — M5442 Lumbago with sciatica, left side: Secondary | ICD-10-CM | POA: Diagnosis not present

## 2022-03-19 DIAGNOSIS — M9905 Segmental and somatic dysfunction of pelvic region: Secondary | ICD-10-CM | POA: Diagnosis not present

## 2022-03-19 DIAGNOSIS — M5136 Other intervertebral disc degeneration, lumbar region: Secondary | ICD-10-CM | POA: Diagnosis not present

## 2022-03-19 DIAGNOSIS — M9902 Segmental and somatic dysfunction of thoracic region: Secondary | ICD-10-CM | POA: Diagnosis not present

## 2022-03-21 DIAGNOSIS — M545 Low back pain, unspecified: Secondary | ICD-10-CM | POA: Diagnosis not present

## 2022-03-21 DIAGNOSIS — M5127 Other intervertebral disc displacement, lumbosacral region: Secondary | ICD-10-CM | POA: Diagnosis not present

## 2022-03-21 DIAGNOSIS — M4807 Spinal stenosis, lumbosacral region: Secondary | ICD-10-CM | POA: Diagnosis not present

## 2022-03-24 DIAGNOSIS — M546 Pain in thoracic spine: Secondary | ICD-10-CM | POA: Diagnosis not present

## 2022-03-24 DIAGNOSIS — M9902 Segmental and somatic dysfunction of thoracic region: Secondary | ICD-10-CM | POA: Diagnosis not present

## 2022-03-24 DIAGNOSIS — M9903 Segmental and somatic dysfunction of lumbar region: Secondary | ICD-10-CM | POA: Diagnosis not present

## 2022-03-24 DIAGNOSIS — M5442 Lumbago with sciatica, left side: Secondary | ICD-10-CM | POA: Diagnosis not present

## 2022-03-24 DIAGNOSIS — M5136 Other intervertebral disc degeneration, lumbar region: Secondary | ICD-10-CM | POA: Diagnosis not present

## 2022-03-24 DIAGNOSIS — M9905 Segmental and somatic dysfunction of pelvic region: Secondary | ICD-10-CM | POA: Diagnosis not present

## 2022-03-26 DIAGNOSIS — M9902 Segmental and somatic dysfunction of thoracic region: Secondary | ICD-10-CM | POA: Diagnosis not present

## 2022-03-26 DIAGNOSIS — M546 Pain in thoracic spine: Secondary | ICD-10-CM | POA: Diagnosis not present

## 2022-03-26 DIAGNOSIS — M9905 Segmental and somatic dysfunction of pelvic region: Secondary | ICD-10-CM | POA: Diagnosis not present

## 2022-03-26 DIAGNOSIS — M9903 Segmental and somatic dysfunction of lumbar region: Secondary | ICD-10-CM | POA: Diagnosis not present

## 2022-03-26 DIAGNOSIS — M5136 Other intervertebral disc degeneration, lumbar region: Secondary | ICD-10-CM | POA: Diagnosis not present

## 2022-03-26 DIAGNOSIS — M5442 Lumbago with sciatica, left side: Secondary | ICD-10-CM | POA: Diagnosis not present

## 2022-03-27 ENCOUNTER — Other Ambulatory Visit: Payer: Self-pay | Admitting: Cardiology

## 2022-03-31 DIAGNOSIS — Z7189 Other specified counseling: Secondary | ICD-10-CM | POA: Diagnosis not present

## 2022-03-31 DIAGNOSIS — Z299 Encounter for prophylactic measures, unspecified: Secondary | ICD-10-CM | POA: Diagnosis not present

## 2022-03-31 DIAGNOSIS — Z6824 Body mass index (BMI) 24.0-24.9, adult: Secondary | ICD-10-CM | POA: Diagnosis not present

## 2022-03-31 DIAGNOSIS — I700 Atherosclerosis of aorta: Secondary | ICD-10-CM | POA: Diagnosis not present

## 2022-03-31 DIAGNOSIS — E7800 Pure hypercholesterolemia, unspecified: Secondary | ICD-10-CM | POA: Diagnosis not present

## 2022-03-31 DIAGNOSIS — R69 Illness, unspecified: Secondary | ICD-10-CM | POA: Diagnosis not present

## 2022-03-31 DIAGNOSIS — I10 Essential (primary) hypertension: Secondary | ICD-10-CM | POA: Diagnosis not present

## 2022-03-31 DIAGNOSIS — Z1339 Encounter for screening examination for other mental health and behavioral disorders: Secondary | ICD-10-CM | POA: Diagnosis not present

## 2022-03-31 DIAGNOSIS — Z1331 Encounter for screening for depression: Secondary | ICD-10-CM | POA: Diagnosis not present

## 2022-03-31 DIAGNOSIS — F3342 Major depressive disorder, recurrent, in full remission: Secondary | ICD-10-CM | POA: Diagnosis not present

## 2022-03-31 DIAGNOSIS — Z0000 Encounter for general adult medical examination without abnormal findings: Secondary | ICD-10-CM | POA: Diagnosis not present

## 2022-04-04 DIAGNOSIS — M9903 Segmental and somatic dysfunction of lumbar region: Secondary | ICD-10-CM | POA: Diagnosis not present

## 2022-04-04 DIAGNOSIS — M546 Pain in thoracic spine: Secondary | ICD-10-CM | POA: Diagnosis not present

## 2022-04-04 DIAGNOSIS — M9905 Segmental and somatic dysfunction of pelvic region: Secondary | ICD-10-CM | POA: Diagnosis not present

## 2022-04-04 DIAGNOSIS — M9902 Segmental and somatic dysfunction of thoracic region: Secondary | ICD-10-CM | POA: Diagnosis not present

## 2022-04-04 DIAGNOSIS — M5442 Lumbago with sciatica, left side: Secondary | ICD-10-CM | POA: Diagnosis not present

## 2022-04-04 DIAGNOSIS — M5136 Other intervertebral disc degeneration, lumbar region: Secondary | ICD-10-CM | POA: Diagnosis not present

## 2022-04-09 DIAGNOSIS — M546 Pain in thoracic spine: Secondary | ICD-10-CM | POA: Diagnosis not present

## 2022-04-09 DIAGNOSIS — M9903 Segmental and somatic dysfunction of lumbar region: Secondary | ICD-10-CM | POA: Diagnosis not present

## 2022-04-09 DIAGNOSIS — M5136 Other intervertebral disc degeneration, lumbar region: Secondary | ICD-10-CM | POA: Diagnosis not present

## 2022-04-09 DIAGNOSIS — M9905 Segmental and somatic dysfunction of pelvic region: Secondary | ICD-10-CM | POA: Diagnosis not present

## 2022-04-09 DIAGNOSIS — M5442 Lumbago with sciatica, left side: Secondary | ICD-10-CM | POA: Diagnosis not present

## 2022-04-09 DIAGNOSIS — M9902 Segmental and somatic dysfunction of thoracic region: Secondary | ICD-10-CM | POA: Diagnosis not present

## 2022-04-11 DIAGNOSIS — M5416 Radiculopathy, lumbar region: Secondary | ICD-10-CM | POA: Diagnosis not present

## 2022-04-11 DIAGNOSIS — M4316 Spondylolisthesis, lumbar region: Secondary | ICD-10-CM | POA: Diagnosis not present

## 2022-04-11 DIAGNOSIS — I10 Essential (primary) hypertension: Secondary | ICD-10-CM | POA: Diagnosis not present

## 2022-04-11 DIAGNOSIS — K219 Gastro-esophageal reflux disease without esophagitis: Secondary | ICD-10-CM | POA: Diagnosis not present

## 2022-04-11 DIAGNOSIS — Z6825 Body mass index (BMI) 25.0-25.9, adult: Secondary | ICD-10-CM | POA: Diagnosis not present

## 2022-04-23 DIAGNOSIS — M5136 Other intervertebral disc degeneration, lumbar region: Secondary | ICD-10-CM | POA: Diagnosis not present

## 2022-04-23 DIAGNOSIS — M546 Pain in thoracic spine: Secondary | ICD-10-CM | POA: Diagnosis not present

## 2022-04-23 DIAGNOSIS — M9902 Segmental and somatic dysfunction of thoracic region: Secondary | ICD-10-CM | POA: Diagnosis not present

## 2022-04-23 DIAGNOSIS — M9905 Segmental and somatic dysfunction of pelvic region: Secondary | ICD-10-CM | POA: Diagnosis not present

## 2022-04-23 DIAGNOSIS — M9903 Segmental and somatic dysfunction of lumbar region: Secondary | ICD-10-CM | POA: Diagnosis not present

## 2022-04-23 DIAGNOSIS — M5442 Lumbago with sciatica, left side: Secondary | ICD-10-CM | POA: Diagnosis not present

## 2022-05-01 ENCOUNTER — Ambulatory Visit (HOSPITAL_COMMUNITY): Payer: Medicare HMO

## 2022-05-05 ENCOUNTER — Ambulatory Visit (HOSPITAL_COMMUNITY)
Admission: RE | Admit: 2022-05-05 | Discharge: 2022-05-05 | Disposition: A | Discharge status: Home / Self Care | Payer: Medicare HMO | Source: Ambulatory Visit | Attending: Internal Medicine | Admitting: Internal Medicine

## 2022-05-05 DIAGNOSIS — Z1231 Encounter for screening mammogram for malignant neoplasm of breast: Secondary | ICD-10-CM | POA: Insufficient documentation

## 2022-05-07 ENCOUNTER — Other Ambulatory Visit (HOSPITAL_COMMUNITY): Payer: Self-pay | Admitting: Internal Medicine

## 2022-05-07 DIAGNOSIS — R928 Other abnormal and inconclusive findings on diagnostic imaging of breast: Secondary | ICD-10-CM

## 2022-05-08 DIAGNOSIS — L820 Inflamed seborrheic keratosis: Secondary | ICD-10-CM | POA: Diagnosis not present

## 2022-05-08 DIAGNOSIS — B078 Other viral warts: Secondary | ICD-10-CM | POA: Diagnosis not present

## 2022-05-12 DIAGNOSIS — K219 Gastro-esophageal reflux disease without esophagitis: Secondary | ICD-10-CM | POA: Diagnosis not present

## 2022-05-12 DIAGNOSIS — I10 Essential (primary) hypertension: Secondary | ICD-10-CM | POA: Diagnosis not present

## 2022-05-14 DIAGNOSIS — M9902 Segmental and somatic dysfunction of thoracic region: Secondary | ICD-10-CM | POA: Diagnosis not present

## 2022-05-14 DIAGNOSIS — M546 Pain in thoracic spine: Secondary | ICD-10-CM | POA: Diagnosis not present

## 2022-05-14 DIAGNOSIS — M5136 Other intervertebral disc degeneration, lumbar region: Secondary | ICD-10-CM | POA: Diagnosis not present

## 2022-05-14 DIAGNOSIS — M5442 Lumbago with sciatica, left side: Secondary | ICD-10-CM | POA: Diagnosis not present

## 2022-05-14 DIAGNOSIS — M9905 Segmental and somatic dysfunction of pelvic region: Secondary | ICD-10-CM | POA: Diagnosis not present

## 2022-05-14 DIAGNOSIS — M9903 Segmental and somatic dysfunction of lumbar region: Secondary | ICD-10-CM | POA: Diagnosis not present

## 2022-05-27 ENCOUNTER — Ambulatory Visit (HOSPITAL_COMMUNITY): Payer: Medicare HMO

## 2022-05-27 ENCOUNTER — Inpatient Hospital Stay (HOSPITAL_COMMUNITY): Admission: RE | Admit: 2022-05-27 | Payer: Medicare HMO | Source: Ambulatory Visit

## 2022-05-28 ENCOUNTER — Ambulatory Visit: Payer: Medicare HMO | Admitting: Allergy & Immunology

## 2022-05-28 ENCOUNTER — Encounter: Payer: Self-pay | Admitting: Allergy & Immunology

## 2022-05-28 VITALS — BP 116/68 | HR 72 | Temp 98.0°F | Resp 16 | Ht <= 58 in | Wt 124.4 lb

## 2022-05-28 DIAGNOSIS — J3089 Other allergic rhinitis: Secondary | ICD-10-CM | POA: Diagnosis not present

## 2022-05-28 DIAGNOSIS — D591 Autoimmune hemolytic anemia, unspecified: Secondary | ICD-10-CM

## 2022-05-28 NOTE — Patient Instructions (Addendum)
1. Chronic rhinitis - Previous testing showed: dust mites - Continue with: Allegra (fexofenadine) 180mg  tablet once daily - Continue with: Atrovent (ipratropium) 0.03% one spray per nostril 2-3 times daily as needed (CAN BE OVER DRYING) - Change the nose spray to AS NEEDED to save you some money!  - You can use an extra dose of the antihistamine, if needed, for breakthrough symptoms.  - Consider nasal saline rinses 1-2 times daily to remove allergens from the nasal cavities as well as help with mucous clearance (this is especially helpful to do before the nasal sprays are given) - I think we can hold off on allergy shots since you are doing SO WELL!   2. Return in about 1 year (around 05/29/2023). You can call us if needed in the meantime!    Please inform us of any Emergency Department visits, hospitalizations, or changes in symptoms. Call us before going to the ED for breathing or allergy symptoms since we might be able to fit you in for a sick visit. Feel free to contact us anytime with any questions, problems, or concerns.  It was a pleasure to see you today! You were an absolute delight!   Websites that have reliable patient information: 1. American Academy of Asthma, Allergy, and Immunology: www.aaaai.org 2. Food Allergy Research and Education (FARE): foodallergy.org 3. Mothers of Asthmatics: http://www.asthmacommunitynetwork.org 4. American College of Allergy, Asthma, and Immunology: www.acaai.org   COVID-19 Vaccine Information can be found at: [email or web address] For questions related to vaccine distribution or appointments, please email [email or web address] or call [phone].   We realize that you might be concerned about having an allergic reaction to the COVID19 vaccines. To help with that concern, WE ARE OFFERING THE COVID19 VACCINES IN OUR OFFICE! Ask the front desk for dates!     "Like" [place] on Facebook and  Instagram for our latest updates!      A healthy democracy works best when [employer] participate! Make sure you are registered to vote! If you have moved or changed any of your contact information, you will need to get this updated before voting!  In some cases, you MAY be able to register to vote online: [email or web address]       Control of Dust Mite Allergen    Dust mites play a major role in allergic asthma and rhinitis.  They occur in environments with high humidity wherever human skin is found.  Dust mites absorb humidity from the atmosphere (ie, they do not drink) and feed on organic matter (including shed human and animal skin).  Dust mites are a microscopic type of insect that you cannot see with the naked eye.  High levels of dust mites have been detected from mattresses, pillows, carpets, upholstered furniture, bed covers, clothes, soft toys and any woven material.  The principal allergen of the dust mite is found in its feces.  A gram of dust may contain 1,000 mites and 250,000 fecal particles.  Mite antigen is easily measured in the air during house cleaning activities.  Dust mites do not bite and do not cause harm to humans, other than by triggering allergies/asthma.    Ways to decrease your exposure to dust mites in your home:  Encase mattresses, box springs and pillows with a mite-impermeable barrier or cover   Wash sheets, blankets and drapes weekly in hot water (130 F) with detergent and dry them in a dryer on the hot setting.  Have the room cleaned frequently with a vacuum cleaner and a damp dust-mop.  For carpeting or rugs, vacuuming with a vacuum cleaner equipped with a high-efficiency particulate air (HEPA) filter.  The dust mite allergic individual should not be in a room which is being cleaned and should wait 1 hour after cleaning before going into the room. Do not sleep on upholstered furniture (eg, couches).   If possible removing  carpeting, upholstered furniture and drapery from the home is ideal.  Horizontal blinds should be eliminated in the rooms where the person spends the most time (bedroom, study, television room).  Washable vinyl, roller-type shades are optimal. Remove all non-washable stuffed toys from the bedroom.  Wash stuffed toys weekly like sheets and blankets above.   Reduce indoor humidity to less than 50%.  Inexpensive humidity monitors can be purchased at most hardware stores.  Do not use a humidifier as can make the problem worse and are not recommended.

## 2022-05-28 NOTE — Progress Notes (Signed)
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 Chronic rhinitis - Previous testing showed: dust mites - Continue with: Allegra (fexofenadine) 180mg  tablet once daily - Continue with: Atrovent (ipratropium) 0.03% one spray per nostril 2-3 times daily as needed (CAN BE OVER DRYING) - Change the nose spray to AS NEEDED to save you some money!  - You can use an extra dose of the antihistamine, if needed, for breakthrough symptoms.  - Consider nasal saline rinses 1-2 times daily to remove allergens from the nasal cavities as well as help with mucous clearance (this is especially helpful to do before the nasal sprays are given) - I think we can hold off on allergy shots since you are doing SO WELL!   2. Return in about 1 year (around 05/29/2023). You can call us if needed in the meantime!     Subjective:   Dawn Lin is a 81 y.o. female presenting today for follow up of  Chief Complaint  Patient presents with   Perennial allergic rhinitis    3 mth f/u - Doing really good. Patient states she has stopped taking OTC Mucinex and started taking Fexofenadine 180 mg 1 tablet daily and it has helped a lot.    [name] has a history of the following: Patient Active Problem List   Diagnosis Date Noted   Hemolytic anemia ([place]) 01/24/2022   GERD (gastroesophageal reflux disease) 12/13/2020   Retrosternal discomfort 09/10/2020   Adenocarcinoma of lung, stage 1, right ([place]) 08/12/2018   Allergic dermatitis 08/12/2018   Anemia 08/12/2018   Anxiety and depression 08/12/2018   Carotid bruit 08/12/2018   Gait instability 08/12/2018   Eustachian tube dysfunction 08/12/2018   Effusion of prepatellar bursa, right  08/12/2018   Fatigue 08/12/2018   Double vision with both eyes open 08/12/2018   Dizziness 08/12/2018   Cough 08/12/2018   Urine frequency 08/12/2018   Solitary pulmonary nodule 08/12/2018   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   Oral candidiasis 08/12/2018   Mammogram declined 08/12/2018   Inguinal hernia, bilateral 08/12/2018   Vitamin D deficiency 08/12/2018   Coronary artery calcification 08/01/2017   Shortness of breath 08/01/2017   Atypical chest pain 08/01/2017   S/P lobectomy of lung 04/20/2017   Malignant neoplasm of right upper lobe of lung ([place]) 04/13/2017   Arthralgia of right temporomandibular joint 01/23/2016   Seasonal allergic rhinitis 01/23/2016   Low back pain 08/12/2011   Sciatica 07/28/2011   Nail fungus 07/28/2011   Preventative health care 06/10/2011   Grief 04/08/2011   Hyperlipidemia 11/26/2010   BACK PAIN 09/03/2010   Other thalassemia ([place]) 11/27/2009   Essential hypertension 11/27/2009   NECK PAIN 11/27/2009   Other osteoporosis 11/27/2009    History obtained from: chart review and patient.  Dawn Lin is a 81 y.o. female presenting for a follow up visit.  She was last seen in May 2023 for a evaluation of chronic rhinitis.  At that time, testing was positive only to dust mites.  We did not do intradermal testing.  We continued with Allegra and started Atrovent for control of postnasal drip.  Since last visit, she has done very well. She is on the [place] daily. She is using the nose spray  every 8 hours. She does report that every time that she goes outside, she gets runny nose. She reports that she gets some draining down into her throat. She has not been having too much of it lately. She does get an irritation in her throat. This is not super painful. This is just a bit uncomfortable.   She is overall very happy with how she is doing. She is not interested in starting allergen immunotherapy. She feels that she is doing very well with this.   Her lungs  are stable.  She is going to have 1 more visit and if everything is clear she will be discharged from the clinic.  She is very thankful about this.  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  She really enjoys the smell of tobacco as it cures.  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  Most of her time is spent with flowering perennials.  Otherwise, there have been no changes to her past medical history, surgical history, family history, or social history.    Review of Systems  Constitutional: Negative.  Negative for chills, fever, malaise/fatigue and weight loss.  HENT:  Positive for congestion and sore throat. Negative for ear discharge and ear pain.   Eyes:  Negative for pain, discharge and redness.  Respiratory:  Negative for cough, sputum production, shortness of breath and wheezing.   Cardiovascular: Negative.  Negative for chest pain and palpitations.  Gastrointestinal:  Negative for abdominal pain, constipation, diarrhea, heartburn, nausea and vomiting.  Skin: Negative.  Negative for itching and rash.  Neurological:  Negative for dizziness and headaches.  Endo/Heme/Allergies:  Negative for environmental allergies. Does not bruise/bleed easily.       Objective:   Blood pressure 116/68, pulse 72, temperature 98 F (36.7 C), resp. rate 16, height 4\' 10"  (1.473 m), weight 124 lb 6.4 oz (56.4 kg), SpO2 98 %. Body mass index is 26 kg/m.    Physical Exam Vitals reviewed.  Constitutional:      General: She is awake.     Appearance: She is well-developed.     Comments: Very pleasant female. Talkative.   HENT:     Head: Normocephalic and atraumatic.     Right Ear: Tympanic membrane, ear canal and external ear normal.     Left Ear: Tympanic membrane, ear canal and external ear normal.     Nose: Mucosal edema present. No nasal deformity, septal deviation or rhinorrhea.     Right Turbinates: Pale. Not enlarged or swollen.      Left Turbinates: Pale. Not enlarged or swollen.     Right Sinus: No maxillary sinus tenderness or frontal sinus tenderness.     Left Sinus: No maxillary sinus tenderness or frontal sinus tenderness.     Mouth/Throat:     Mouth: Mucous membranes are not pale and not dry.     Pharynx: Uvula midline.  Eyes:     General: Lids are normal. No allergic shiner.       Right eye: No discharge.        Left eye: No discharge.     Conjunctiva/sclera: Conjunctivae normal.     Right eye: Right conjunctiva is not injected. No chemosis.    Left eye: Left conjunctiva is not injected. No chemosis.    Pupils: Pupils are equal, round, and reactive to light.  Cardiovascular:     Rate and Rhythm: Normal rate and regular rhythm.  Heart sounds: Normal heart sounds.  Pulmonary:     Effort: Pulmonary effort is normal. No tachypnea, accessory muscle usage or respiratory distress.     Breath sounds: Normal breath sounds. No wheezing, rhonchi or rales.     Comments: Moving air well in all lung fields.  Chest:     Chest wall: No tenderness.  Lymphadenopathy:     Cervical: No cervical adenopathy.  Skin:    General: Skin is warm.     Capillary Refill: Capillary refill takes less than 2 seconds.     Coloration: Skin is not pale.     Findings: No abrasion, erythema, petechiae or rash. Rash is not papular, urticarial or vesicular.     Comments: No eczematous or urticarial lesions noted.   Neurological:     Mental Status: She is alert.  Psychiatric:        Behavior: Behavior is cooperative.      Diagnostic studies:  none      Salvatore Marvel, MD  [facility]

## 2022-06-02 ENCOUNTER — Ambulatory Visit (HOSPITAL_COMMUNITY)
Admission: RE | Admit: 2022-06-02 | Discharge: 2022-06-02 | Disposition: A | Discharge status: Home / Self Care | Payer: Medicare HMO | Source: Ambulatory Visit | Attending: Internal Medicine | Admitting: Internal Medicine

## 2022-06-02 DIAGNOSIS — R928 Other abnormal and inconclusive findings on diagnostic imaging of breast: Secondary | ICD-10-CM | POA: Diagnosis not present

## 2022-06-10 ENCOUNTER — Inpatient Hospital Stay: Payer: Medicare HMO | Attending: Hematology

## 2022-06-10 ENCOUNTER — Ambulatory Visit (HOSPITAL_COMMUNITY)
Admission: RE | Admit: 2022-06-10 | Discharge: 2022-06-10 | Disposition: A | Discharge status: Home / Self Care | Payer: Medicare HMO | Source: Ambulatory Visit | Attending: Hematology | Admitting: Hematology

## 2022-06-10 DIAGNOSIS — Z902 Acquired absence of lung [part of]: Secondary | ICD-10-CM | POA: Insufficient documentation

## 2022-06-10 DIAGNOSIS — Z85118 Personal history of other malignant neoplasm of bronchus and lung: Secondary | ICD-10-CM | POA: Diagnosis not present

## 2022-06-10 DIAGNOSIS — D591 Autoimmune hemolytic anemia, unspecified: Secondary | ICD-10-CM | POA: Diagnosis not present

## 2022-06-10 DIAGNOSIS — D590 Drug-induced autoimmune hemolytic anemia: Secondary | ICD-10-CM | POA: Insufficient documentation

## 2022-06-10 DIAGNOSIS — R790 Abnormal level of blood mineral: Secondary | ICD-10-CM | POA: Diagnosis not present

## 2022-06-10 DIAGNOSIS — C3411 Malignant neoplasm of upper lobe, right bronchus or lung: Secondary | ICD-10-CM | POA: Diagnosis not present

## 2022-06-10 LAB — CBC WITH DIFFERENTIAL/PLATELET
Abs Immature Granulocytes: 0.05 10*3/uL (ref 0.00–0.07)
Basophils Absolute: 0.1 10*3/uL (ref 0.0–0.1)
Basophils Relative: 1 %
Eosinophils Absolute: 0.1 10*3/uL (ref 0.0–0.5)
Eosinophils Relative: 1 %
HCT: 34.3 % — ABNORMAL LOW (ref 36.0–46.0)
Hemoglobin: 10.9 g/dL — ABNORMAL LOW (ref 12.0–15.0)
Immature Granulocytes: 1 %
Lymphocytes Relative: 8 %
Lymphs Abs: 0.8 10*3/uL (ref 0.7–4.0)
MCH: 21.1 pg — ABNORMAL LOW (ref 26.0–34.0)
MCHC: 31.8 g/dL (ref 30.0–36.0)
MCV: 66.5 fL — ABNORMAL LOW (ref 80.0–100.0)
Monocytes Absolute: 0.7 10*3/uL (ref 0.1–1.0)
Monocytes Relative: 8 %
Neutro Abs: 7.4 10*3/uL (ref 1.7–7.7)
Neutrophils Relative %: 81 %
Platelets: 303 10*3/uL (ref 150–400)
RBC: 5.16 MIL/uL — ABNORMAL HIGH (ref 3.87–5.11)
RDW: 17.9 % — ABNORMAL HIGH (ref 11.5–15.5)
WBC: 9.2 10*3/uL (ref 4.0–10.5)
nRBC: 0.7 % — ABNORMAL HIGH (ref 0.0–0.2)

## 2022-06-10 LAB — COMPREHENSIVE METABOLIC PANEL
ALT: 13 U/L (ref 0–44)
AST: 25 U/L (ref 15–41)
Albumin: 4.3 g/dL (ref 3.5–5.0)
Alkaline Phosphatase: 51 U/L (ref 38–126)
Anion gap: 7 (ref 5–15)
BUN: 17 mg/dL (ref 8–23)
CO2: 27 mmol/L (ref 22–32)
Calcium: 9.2 mg/dL (ref 8.9–10.3)
Chloride: 105 mmol/L (ref 98–111)
Creatinine, Ser: 0.7 mg/dL (ref 0.44–1.00)
GFR, Estimated: >60 mL/min (ref >60)
Glucose, Bld: 89 mg/dL (ref 70–99)
Potassium: 3.1 mmol/L — ABNORMAL LOW (ref 3.5–5.1)
Sodium: 139 mmol/L (ref 135–145)
Total Bilirubin: 2.1 mg/dL — ABNORMAL HIGH (ref 0.3–1.2)
Total Protein: 6.5 g/dL (ref 6.5–8.1)

## 2022-06-10 LAB — RETICULOCYTES
Immature Retic Fract: 27.1 % — ABNORMAL HIGH (ref 2.3–15.9)
RBC.: 5.17 MIL/uL — ABNORMAL HIGH (ref 3.87–5.11)
Retic Count, Absolute: 150.4 10*3/uL (ref 19.0–186.0)
Retic Ct Pct: 2.9 % (ref 0.4–3.1)

## 2022-06-10 LAB — LACTATE DEHYDROGENASE: LDH: 188 U/L (ref 98–192)

## 2022-06-11 ENCOUNTER — Encounter: Payer: Self-pay | Admitting: Cardiology

## 2022-06-11 ENCOUNTER — Ambulatory Visit: Payer: Medicare HMO | Attending: Cardiology | Admitting: Cardiology

## 2022-06-11 VITALS — BP 120/64 | HR 70 | Ht 59.0 in | Wt 127.2 lb

## 2022-06-11 DIAGNOSIS — M546 Pain in thoracic spine: Secondary | ICD-10-CM | POA: Diagnosis not present

## 2022-06-11 DIAGNOSIS — M9903 Segmental and somatic dysfunction of lumbar region: Secondary | ICD-10-CM | POA: Diagnosis not present

## 2022-06-11 DIAGNOSIS — I350 Nonrheumatic aortic (valve) stenosis: Secondary | ICD-10-CM | POA: Diagnosis not present

## 2022-06-11 DIAGNOSIS — M5442 Lumbago with sciatica, left side: Secondary | ICD-10-CM | POA: Diagnosis not present

## 2022-06-11 DIAGNOSIS — E782 Mixed hyperlipidemia: Secondary | ICD-10-CM | POA: Diagnosis not present

## 2022-06-11 DIAGNOSIS — M9902 Segmental and somatic dysfunction of thoracic region: Secondary | ICD-10-CM | POA: Diagnosis not present

## 2022-06-11 DIAGNOSIS — M5136 Other intervertebral disc degeneration, lumbar region: Secondary | ICD-10-CM | POA: Diagnosis not present

## 2022-06-11 DIAGNOSIS — I2510 Atherosclerotic heart disease of native coronary artery without angina pectoris: Secondary | ICD-10-CM

## 2022-06-11 DIAGNOSIS — I10 Essential (primary) hypertension: Secondary | ICD-10-CM | POA: Diagnosis not present

## 2022-06-11 DIAGNOSIS — M9905 Segmental and somatic dysfunction of pelvic region: Secondary | ICD-10-CM | POA: Diagnosis not present

## 2022-06-11 MED ORDER — FUROSEMIDE 20 MG PO TABS: 20.0000 mg | ORAL_TABLET | ORAL | 1 refills | Status: DC | PRN

## 2022-06-11 MED ORDER — POTASSIUM CHLORIDE CRYS ER 20 MEQ PO TBCR: 40.0000 meq | EXTENDED_RELEASE_TABLET | Freq: Every day | ORAL | 0 refills | Status: DC

## 2022-06-11 NOTE — Patient Instructions (Addendum)
Medication Instructions:  Your physician has recommended you make the following change in your medication:  Stop hydrochlorothiazide Start lasix 20 mg as needed for swelling Take potassium 40 meq once a day for 3 days Continue all other medications as directed   Labwork: none  Testing/Procedures: none  Follow-Up:  Your physician recommends that you schedule a follow-up appointment in: 6 months  Any Other Special Instructions Will Be Listed Below (If Applicable).  Call the office or send a MyChart message in one week with BP readings.  If you need a refill on your cardiac medications before your next appointment, please call your pharmacy.

## 2022-06-11 NOTE — Progress Notes (Signed)
Clinical Summary Ms. Virgen is a 81 y.o.female seen today for follow up of the following medical problems.      1. CAD - noted on prior CT scsan - 07/2017 nuclear stress: no clear ischemia.  07/2017 echo LVEF 47-82%, grade I diastolic dysfunction    - no recent cardiac chest pains - compliant with meds   2. HTN - compliant with meds - ongogn issues with low K on HCTZ, frequent urination   3. Hyperlipidemia - she is on on crestor     07/2020 LDL 84 HDL 67 TG 140 TC 175     4. Lung adenocarcinoma - s/p resection.    5. SOB - 03/2017 PFTs: minimal perfusion defect      6. Mild aortic stenosis 07/2017 echo  mean grad 16, AVA VTI 1.54 would suggest mild AS.  - no recent symptoms.   02/2021 echo LVEF 60-65%, no WMAs, mild to mod AS mean grad 15, AVA VTI, 1.15, DI 0.33      7. Vision change - episode of vision change, lightheadedness while driving - seen at [facility].  - extensive workup in ER was unremarkable. 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 Followed by heme/onc       SH: Sister is [place] who is also coming to establish in our clinic   Past Medical History:  Diagnosis Date   Anemia    Anxiety    Dyspnea    with exertion   Essential hypertension, benign since the 1900's   Family history of adverse reaction to anesthesia    Children - N/V   History of blood transfusion    Hyperlipidemia    Other osteoporosis    Other thalassemia ([facility])      Allergies  Allergen Reactions   Lisinopril Cough     Current Outpatient Medications  Medication Sig Dispense Refill   acetaminophen (TYLENOL) 500 MG tablet Take 1 tablet (500 mg total) by mouth every 6 (six) hours as needed. 30 tablet 0   Alpha-D-Galactosidase (BEANO PO) Take 1 capsule by mouth daily as needed (bloating).     amLODipine (NORVASC) 10 MG tablet Take 10 mg by mouth daily.     Ascorbic Acid (VITAMIN C) 500 MG CHEW Chew 500 mg  by mouth daily.     aspirin EC 81 MG tablet Take 81 mg by mouth daily.     benzonatate (TESSALON) 100 MG capsule Take 1 capsule (100 mg total) by mouth 3 (three) times daily as needed for cough. 30 capsule 0   Calcium-Phosphorus-Vitamin D [phone] MG-MG-UNIT CHEW Chew 1 each by mouth 2 (two) times daily.      fexofenadine (ALLEGRA) 180 MG tablet Take 180 mg by mouth daily as needed for allergies.     folic acid (FOLVITE) 1 MG tablet Take 1 mg by mouth daily.     guaiFENesin (MUCINEX) 600 MG 12 hr tablet Take 600 mg by mouth 2 (two) times daily as needed for cough or to loosen phlegm.     hydrochlorothiazide (HYDRODIURIL) 25 MG tablet Take 1 tablet (25 mg total) by mouth daily. 93 tablet 4   ibuprofen (ADVIL) 200 MG tablet Take 200 mg by mouth every 6 (six) hours as needed for moderate pain.     ipratropium (ATROVENT) 0.03 % nasal spray Place 2 sprays into both nostrils 3 (three) times daily. 30 mL 5   Krill Oil 500 MG CAPS Take 1 capsule  by mouth daily at 12 noon.     meclizine (ANTIVERT) 12.5 MG tablet Take 12.5 mg by mouth every 6 (six) hours as needed for dizziness.     Multiple Vitamin (MULTIVITAMIN WITH MINERALS) TABS tablet Take 1 tablet by mouth daily.     predniSONE (DELTASONE) 5 MG tablet Take by mouth.     rosuvastatin (CRESTOR) 5 MG tablet TAKE ONE TABLET BY MOUTH ONCE DAILY 90 tablet 0   simethicone (MYLICON) 80 MG chewable tablet Chew 80 mg by mouth every 6 (six) hours as needed for flatulence.     No current facility-administered medications for this visit.     Past Surgical History:  Procedure Laterality Date   BIOPSY  12/28/2020   Procedure: BIOPSY;  Surgeon: Harvel Quale, MD;  [facility];  Service: Gastroenterology;;   COLONOSCOPY     COLONOSCOPY WITH PROPOFOL N/A 12/28/2020   Procedure: COLONOSCOPY WITH PROPOFOL;  Surgeon: Harvel Quale, MD;  [facility];  Service: Gastroenterology;  Laterality: N/A;  AM    ESOPHAGOGASTRODUODENOSCOPY (EGD) WITH PROPOFOL N/A 12/28/2020   Procedure: ESOPHAGOGASTRODUODENOSCOPY (EGD) WITH PROPOFOL;  Surgeon: Harvel Quale, MD;  [facility];  Service: Gastroenterology;  Laterality: N/A;   INGUINAL HERNIA REPAIR Left    LUMBAR [place] SURGERY  1990's   for severe pain and sciatica   PARTIAL HYSTERECTOMY  1970's   for bleeding and prolapse   VIDEO ASSISTED THORACOSCOPY (VATS)/ LOBECTOMY Right 04/20/2017   Procedure: VIDEO ASSISTED THORACOSCOPY (VATS)/RIGHT UPPER LOBECTOMY;  Surgeon: Grace Isaac, MD;  Location: [facility];  Service: Thoracic;  Laterality: Right;   VIDEO BRONCHOSCOPY N/A 04/20/2017   Procedure: VIDEO BRONCHOSCOPY;  Surgeon: Grace Isaac, MD;  Location: [facility] OR;  Service: Thoracic;  Laterality: N/A;     Allergies  Allergen Reactions   Lisinopril Cough      Family History  Problem Relation Age of Onset   Hypertension Mother    Asthma Sister    Heart attack Sister 34     Social History Ms. Laconte reports that she has never smoked. She has never used smokeless tobacco. Ms. Rozycki reports no history of alcohol use.   Review of Systems CONSTITUTIONAL: No weight loss, fever, chills, weakness or fatigue.  HEENT: Eyes: No visual loss, blurred vision, double vision or yellow sclerae.No hearing loss, sneezing, congestion, runny nose or sore throat.  SKIN: No rash or itching.  CARDIOVASCULAR: per hpi RESPIRATORY: No shortness of breath, cough or sputum.  GASTROINTESTINAL: No anorexia, nausea, vomiting or diarrhea. No abdominal pain or blood.  GENITOURINARY: No burning on urination, no polyuria NEUROLOGICAL: No headache, dizziness, syncope, paralysis, ataxia, numbness or tingling in the extremities. No change in bowel or bladder control.  MUSCULOSKELETAL: No muscle, back pain, joint pain or stiffness.  LYMPHATICS: No enlarged nodes. No history of splenectomy.  PSYCHIATRIC: No history of depression or anxiety.   ENDOCRINOLOGIC: No reports of sweating, cold or heat intolerance. No polyuria or polydipsia.  Marland Kitchen   Physical Examination Today's Vitals   06/11/22 1337  BP: 120/64  Pulse: 70  SpO2: 100%  Weight: 127 lb 3.2 oz (57.7 kg)  Height: 4\' 11"  (1.499 m)   Body mass index is 25.69 kg/m.  Gen: resting comfortably, no acute distress HEENT: no scleral icterus, pupils equal round and reactive, no palptable cervical adenopathy,  CV: RRR, 3/6 systolic murmur rusb, no jvd Resp: Clear to auscultation bilaterally GI: abdomen is soft, non-tender, non-distended, normal bowel sounds, no hepatosplenomegaly  MSK: extremities are warm, no edema.  Skin: warm, no rash Neuro:  no focal deficits Psych: appropriate affect   Diagnostic Studies  07/2018 nuclear stress Nuclear stress EF: 80%. There was no ST segment deviation noted during stress. There is a large defect of moderate severity present in the basal anteroseptal, basal inferoseptal, mid anteroseptal, mid inferoseptal, apical septal and apex location. The defect is non-reversible. In the setting of normal LVF, this is likely due to breast attenuation artifact. No ischemia noted. This is a low risk study. The left ventricular ejection fraction is hyperdynamic (>65%).     07/2017 echo Study Conclusions   - Left ventricle: The cavity size was normal. Wall thickness was   normal. Systolic function was normal. The estimated ejection   fraction was in the range of 60% to 65%. Wall motion was normal;   there were no regional wall motion abnormalities. Doppler   parameters are consistent with abnormal left ventricular   relaxation (grade 1 diastolic dysfunction). - Aortic valve: There was trivial regurgitation.   02/2021 echo  1. Left ventricular ejection fraction, by estimation, is 60 to 65%. The  left ventricle has normal function. Left ventricular endocardial border  not optimally defined to evaluate regional wall motion. Left ventricular   diastolic parameters are  indeterminate. Elevated left atrial pressure.   2. Right ventricular systolic function is normal. The right ventricular  size is normal.   3. The mitral valve is normal in structure. No evidence of mitral valve  regurgitation. No evidence of mitral stenosis.   4. The aortic valve is tricuspid. There is mild calcification of the  aortic valve. There is mild thickening of the aortic valve. Aortic valve  regurgitation is mild. Mild to moderate aortic valve stenosis .Aortic  valve mean gradient measures 15.0 mmHg.  Aortic valve peak gradient measures 33.7 mmHg. Aortic valve area, by VTI  measures 1.15 cm.   5. The inferior vena cava is normal in size with greater than 50%  respiratory variability, suggesting right atrial pressure of 3 mmHg.   Assessment and Plan   1. CAD - noted by CT scan, nuclear stress test shows no significnat ischemia - no symptoms, continue to monitor   2. Hyperlipidemia - requset pcp labs, continue current meds   3. Aortic stenosis - mild to mod AS by echo, repeat echo 2024  - no symptoms  4. HTN - recurrent issues with low K, frequent urination on HCTZ - stop HCTZ, call us 1 week with bp's. If additional agent neeeded would start losartan 25mg  daily - KCl 43mEq daily x 3 days.   F/u 6 months   Arnoldo Lenis, M.D.,

## 2022-06-17 ENCOUNTER — Inpatient Hospital Stay: Payer: Medicare HMO | Attending: Hematology | Admitting: Hematology

## 2022-06-17 VITALS — BP 135/54 | HR 72 | Temp 98.5°F | Resp 18 | Ht 59.0 in | Wt 124.0 lb

## 2022-06-17 DIAGNOSIS — D563 Thalassemia minor: Secondary | ICD-10-CM | POA: Diagnosis not present

## 2022-06-17 DIAGNOSIS — D590 Drug-induced autoimmune hemolytic anemia: Secondary | ICD-10-CM

## 2022-06-17 DIAGNOSIS — Z902 Acquired absence of lung [part of]: Secondary | ICD-10-CM | POA: Insufficient documentation

## 2022-06-17 DIAGNOSIS — Z803 Family history of malignant neoplasm of breast: Secondary | ICD-10-CM | POA: Diagnosis not present

## 2022-06-17 DIAGNOSIS — Z7952 Long term (current) use of systemic steroids: Secondary | ICD-10-CM | POA: Diagnosis not present

## 2022-06-17 DIAGNOSIS — Z85118 Personal history of other malignant neoplasm of bronchus and lung: Secondary | ICD-10-CM | POA: Insufficient documentation

## 2022-06-17 DIAGNOSIS — D591 Autoimmune hemolytic anemia, unspecified: Secondary | ICD-10-CM | POA: Insufficient documentation

## 2022-06-17 NOTE — Progress Notes (Signed)
Dawn Lin, [place] [address]   CLINIC:  Medical Oncology/Hematology  PCP:  [name], MD [address] / [place] [address]  [phone]  REASON FOR VISIT:  Follow-up for autoimmune hemolytic anemia  PRIOR THERAPY: none  CURRENT THERAPY: surveillance  INTERVAL HISTORY:  [name], a 81 y.o. female, seen for follow-up of autoimmune hemolytic anemia.  Denies any chest pains or lightheadedness.  Her cardiologist has recently discontinued HCTZ.  Energy levels are 85%.  No recent infections noted.  REVIEW OF SYSTEMS:  Review of Systems  Constitutional:  Negative for appetite change.  Neurological:  Positive for numbness (L leg).  All other systems reviewed and are negative.   PAST MEDICAL/SURGICAL HISTORY:  Past Medical History:  Diagnosis Date   Anemia    Anxiety    Dyspnea    with exertion   Essential hypertension, benign since the 1900's   Family history of adverse reaction to anesthesia    Children - N/V   History of blood transfusion    Hyperlipidemia    Other osteoporosis    Other thalassemia ([place])    Past Surgical History:  Procedure Laterality Date   BIOPSY  12/28/2020   Procedure: BIOPSY;  Surgeon: Harvel Quale, MD;  [facility];  Service: Gastroenterology;;   COLONOSCOPY     COLONOSCOPY WITH PROPOFOL N/A 12/28/2020   Procedure: COLONOSCOPY WITH PROPOFOL;  Surgeon: Harvel Quale, MD;  [facility];  Service: Gastroenterology;  Laterality: N/A;  AM   ESOPHAGOGASTRODUODENOSCOPY (EGD) WITH PROPOFOL N/A 12/28/2020   Procedure: ESOPHAGOGASTRODUODENOSCOPY (EGD) WITH PROPOFOL;  Surgeon: Harvel Quale, MD;  [facility]: [facility];  Service: Gastroenterology;  Laterality: N/A;   INGUINAL HERNIA REPAIR Left    LUMBAR [place] SURGERY  1990's   for severe pain and sciatica   PARTIAL HYSTERECTOMY  1970's   for bleeding and prolapse   VIDEO ASSISTED  THORACOSCOPY (VATS)/ LOBECTOMY Right 04/20/2017   Procedure: VIDEO ASSISTED THORACOSCOPY (VATS)/RIGHT UPPER LOBECTOMY;  Surgeon: Grace Isaac, MD;  Location: [facility];  Service: Thoracic;  Laterality: Right;   VIDEO BRONCHOSCOPY N/A 04/20/2017   Procedure: VIDEO BRONCHOSCOPY;  Surgeon: Grace Isaac, MD;  Location: [facility];  Service: Thoracic;  Laterality: N/A;    SOCIAL HISTORY:  Social History   Socioeconomic History   Marital status: Widowed    Spouse name: Not on file   Number of children: Not on file   Years of education: Not on file   Highest education level: Not on file  Occupational History   Occupation: retired  Tobacco Use   Smoking status: Never   Smokeless tobacco: Never  Vaping Use   Vaping Use: Never used  Substance and Sexual Activity   Alcohol use: No   Drug use: No   Sexual activity: Never  Other Topics Concern   Not on file  Social History Narrative   [name] is married. Her husband has been chronically ill for 6 years (2011). He has metastatic prostate cancer and multiple myeloma. 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 She has a lot of family support.    Social Determinants of Health   Financial Resource Strain: Not on file  Food Insecurity: Not on file  Transportation Needs: Not on file  Physical Activity: Not on file  Stress: Not on file  Social Connections: Not on file  Intimate Partner Violence: Not on  file    FAMILY HISTORY:  Family History  Problem Relation Age of Onset   Hypertension Mother    Asthma Sister    Heart attack Sister 4    CURRENT MEDICATIONS:  Current Outpatient Medications  Medication Sig Dispense Refill   acetaminophen (TYLENOL) 500 MG tablet Take 1 tablet (500 mg total) by mouth every 6 (six) hours as needed. 30 tablet 0   Alpha-D-Galactosidase (BEANO PO) Take 1 capsule by mouth daily as needed (bloating).     amLODipine (NORVASC) 10 MG tablet Take 10 mg by mouth daily.     Ascorbic Acid (VITAMIN  C) 500 MG CHEW Chew 500 mg by mouth daily.     aspirin EC 81 MG tablet Take 81 mg by mouth daily.     benzonatate (TESSALON) 100 MG capsule Take 1 capsule (100 mg total) by mouth 3 (three) times daily as needed for cough. 30 capsule 0   Calcium-Phosphorus-Vitamin D [id number] MG-MG-UNIT CHEW Chew 1 each by mouth 2 (two) times daily.      fexofenadine (ALLEGRA) 180 MG tablet Take 180 mg by mouth daily as needed for allergies.     folic acid (FOLVITE) 1 MG tablet Take 2 mg by mouth daily.     furosemide (LASIX) 20 MG tablet Take 1 tablet (20 mg total) by mouth as needed (for swelling). 90 tablet 1   guaiFENesin (MUCINEX) 600 MG 12 hr tablet Take 600 mg by mouth 2 (two) times daily as needed for cough or to loosen phlegm.     ibuprofen (ADVIL) 200 MG tablet Take 200 mg by mouth every 6 (six) hours as needed for moderate pain.     ipratropium (ATROVENT) 0.03 % nasal spray Place 2 sprays into both nostrils 3 (three) times daily. 30 mL 5   Krill Oil 500 MG CAPS Take 1 capsule by mouth daily at 12 noon.     meclizine (ANTIVERT) 12.5 MG tablet Take 12.5 mg by mouth every 6 (six) hours as needed for dizziness.     Multiple Vitamin (MULTIVITAMIN WITH MINERALS) TABS tablet Take 1 tablet by mouth daily.     potassium chloride SA (KLOR-CON M) 20 MEQ tablet Take 2 tablets (40 mEq total) by mouth daily. For 3 days 6 tablet 0   predniSONE (DELTASONE) 5 MG tablet Take 5 mg by mouth daily with breakfast.     rosuvastatin (CRESTOR) 5 MG tablet TAKE ONE TABLET BY MOUTH ONCE DAILY 90 tablet 0   simethicone (MYLICON) 80 MG chewable tablet Chew 80 mg by mouth every 6 (six) hours as needed for flatulence.     No current facility-administered medications for this visit.    ALLERGIES:  Allergies  Allergen Reactions   Lisinopril Cough    PHYSICAL EXAM:  Performance status (ECOG): 1 - Symptomatic but completely ambulatory  Vitals:   06/17/22 1113  BP: (!) 135/54  Pulse: 72  Resp: 18  Temp: 98.5 F (36.9 C)   SpO2: 98%   Wt Readings from Last 3 Encounters:  06/17/22 124 lb (56.2 kg)  06/11/22 127 lb 3.2 oz (57.7 kg)  05/28/22 124 lb 6.4 oz (56.4 kg)   Physical Exam Vitals reviewed.  Constitutional:      Appearance: Normal appearance.  Cardiovascular:     Rate and Rhythm: Normal rate and regular rhythm.     Pulses: Normal pulses.     Heart sounds: Murmur heard.     Systolic murmur is present.  Pulmonary:     Effort: Pulmonary  effort is normal.     Breath sounds: Normal breath sounds.  Abdominal:     Palpations: Abdomen is soft. There is no hepatomegaly, splenomegaly or mass.     Tenderness: There is no abdominal tenderness.  Musculoskeletal:     Right lower leg: No edema.     Left lower leg: No edema.  Neurological:     General: No focal deficit present.     Mental Status: She is alert and oriented to person, place, and time.  Psychiatric:        Mood and Affect: Mood normal.        Behavior: Behavior normal.    LABORATORY DATA:  I have reviewed the labs as listed.     Latest Ref Rng & Units 06/10/2022    9:56 AM 03/06/2022   12:11 PM 01/24/2022   10:53 AM  CBC  WBC 4.0 - 10.5 K/uL 9.2  11.0  12.4   Hemoglobin 12.0 - 15.0 g/dL 10.9  10.7  10.0   Hematocrit 36.0 - 46.0 % 34.3  33.7  31.3   Platelets 150 - 400 K/uL 303  305  301       Latest Ref Rng & Units 06/10/2022    9:56 AM 03/06/2022   12:11 PM 12/26/2020    4:45 PM  CMP  Glucose 70 - 99 mg/dL 89  101    BUN 8 - 23 mg/dL 17  21    Creatinine 0.44 - 1.00 mg/dL 0.70  0.66    Sodium 135 - 145 mmol/L 139  136    Potassium 3.5 - 5.1 mmol/L 3.1  3.4  3.3   Chloride 98 - 111 mmol/L 105  103    CO2 22 - 32 mmol/L 27  28    Calcium 8.9 - 10.3 mg/dL 9.2  9.0    Total Protein 6.5 - 8.1 g/dL 6.5  6.5    Total Bilirubin 0.3 - 1.2 mg/dL 2.1  2.0    Alkaline Phos 38 - 126 U/L 51  54    AST 15 - 41 U/L 25  22    ALT 0 - 44 U/L 13  9        Component Value Date/Time   RBC 5.16 (H) 06/10/2022 0956   RBC 5.17 (H)  06/10/2022 0956   MCV 66.5 (L) 06/10/2022 0956   MCH 21.1 (L) 06/10/2022 0956   MCHC 31.8 06/10/2022 0956   RDW 17.9 (H) 06/10/2022 0956   LYMPHSABS 0.8 06/10/2022 0956   MONOABS 0.7 06/10/2022 0956   EOSABS 0.1 06/10/2022 0956   BASOSABS 0.1 06/10/2022 0956    DIAGNOSTIC IMAGING:  I have independently reviewed the scans and discussed with the patient. 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 RADIATION DOSE REDUCTION: This exam was performed according to the departmental dose-optimization program which includes automated exposure control, adjustment of the mA and/or kV according to patient size and/or use of iterative reconstruction technique. COMPARISON:  04/26/2020 FINDINGS: Cardiovascular: The heart is normal in size. No pericardial effusion. No evidence of thoracic aortic aneurysm. Atherosclerotic calcifications of the aortic root/arch. Coronary atherosclerosis of the LAD. Mediastinum/Nodes: No suspicious mediastinal lymphadenopathy. Visualized thyroid is unremarkable. Lungs/Pleura: Status post right upper lobectomy. Mild linear scarring/atelectasis in the lingula and left lower  lobe. No suspicious pulmonary nodules. No pleural effusion or pneumothorax. Upper Abdomen: Visualized upper abdomen is notable for multiple layering gallstones (series 2/image 121) and vascular calcifications. Musculoskeletal: Visualized osseous structures are within normal limits. IMPRESSION: Status post right upper lobectomy. No evidence of recurrent or metastatic disease. Cholelithiasis. Aortic Atherosclerosis (ICD10-I70.0). Electronically Signed   By: Julian Hy M.D.   On: 06/10/2022 23:54   MM DIAG BREAST TOMO UNI RIGHT  Result Date:  06/02/2022 CLINICAL DATA:  Patient recalled from screening for right breast asymmetry. EXAM: DIGITAL DIAGNOSTIC UNILATERAL RIGHT MAMMOGRAM WITH TOMOSYNTHESIS TECHNIQUE: Right digital diagnostic mammography and breast tomosynthesis was performed. COMPARISON:  Previous exam(s). ACR Breast Density Category c: The breast tissue is heterogeneously dense, which may obscure small masses. FINDINGS: Questioned asymmetry within the right breast resolved with additional imaging compatible with dense fibroglandular tissue. No suspicious findings. IMPRESSION: No mammographic evidence for malignancy. RECOMMENDATION: Screening mammogram in one year.(Code:[id number]) I have discussed the findings and recommendations with the patient. If applicable, a reminder letter will be sent to the patient regarding the next appointment. BI-RADS CATEGORY  1: Negative. Electronically Signed   By: Lovey Newcomer M.D.   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 - Status post rituximab weekly x4 completed on 11/28/2021.  She had relapse of hemolysis when prednisone was discontinued.  Currently on prednisone 5 mg daily treated by Dr Erven Colla at [facility].  She is switching care due to proximity. - She also has hereditary beta thalassemia trait (11/13/2020 hemoglobin electrophoresis: HbA-91.8%, HbA2-4.9%, HbF-3.3%) and reports her normal hemoglobin is between 10 and 11.    Social/family history: - She lives by herself at home.  She retired from office job.  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  Her half-sister is [place] who is our patient.  She also worked on at tobacco farm.  She has exposure to [address] [place] and other fertilizers.  She was never smoker. - Half sister has breast cancer.  Does not know her paternal side of the family.   PLAN:  Autoimmune hemolytic anemia: - Reviewed labs from 06/10/2022.  Total bilirubin is 2.1.   LDH and reticulocyte count was normal.  Hemoglobin is stable at 10.9. - Her normal hemoglobin is between 10 and 11. - Continue maintenance prednisone 5 mg daily.  Continue folic acid daily. - RTC 6 months for follow-up with repeat labs.  2.  Elevated ferritin: - She had normal ferritin until August 2022.  She received 2 infusions of iron.  Last ferritin was 569.  We will repeat it at next visit.  3.  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 - Reviewed CT chest without contrast from 06/10/2022 which showed no evidence of recurrence or metastatic disease. - No need of further scans.  Orders placed this encounter:  No orders of the defined types were placed in this encounter.    Derek Jack, MD [place] [phone]

## 2022-06-17 NOTE — Patient Instructions (Signed)
[place] at [facility] Discharge Instructions   You were seen and examined today by Dr. Delton Coombes.  He reviewed the results of your lab work which are normal/stable.   He reviewed the results of your CT scan which was normal.  We will see you back in 6 months with repeat blood work.    Thank you for choosing [place] at [facility] to provide your oncology and hematology care.  To afford each patient quality time with our provider, please arrive at least 15 minutes before your scheduled appointment time.   If you have a lab appointment with the [facility] please come in thru the Main Entrance and check in at the main information desk.  You need to re-schedule your appointment should you arrive 10 or more minutes late.  We strive to give you quality time with our providers, and arriving late affects you and other patients whose appointments are after yours.  Also, if you no show three or more times for appointments you may be dismissed from the clinic at the providers discretion.     Again, thank you for choosing [facility].  Our hope is that these requests will decrease the amount of time that you wait before being seen by our physicians.       _____________________________________________________________  Should you have questions after your visit to [facility], please contact our office at [phone] and follow the prompts.  Our office hours are 8:00 a.m. and 4:30 p.m. Monday - Friday.  Please note that voicemails left after 4:00 p.m. may not be returned until the following business day.  We are closed weekends and major holidays.  You do have access to a nurse 24-7, just call the main number to the clinic [phone] and do not press any options, hold on the line and a nurse will answer the phone.    For prescription refill requests, have your pharmacy contact our office and allow 72 hours.    Due to  Covid, you will need to wear a mask upon entering the hospital. If you do not have a mask, a mask will be given to you at the Main Entrance upon arrival. For doctor visits, patients may have 1 support person age 41 or older with them. For treatment visits, patients can not have anyone with them due to social distancing guidelines and our immunocompromised population.

## 2022-06-23 ENCOUNTER — Telehealth: Payer: Self-pay | Admitting: Cardiology

## 2022-06-23 ENCOUNTER — Encounter: Payer: Self-pay | Admitting: *Deleted

## 2022-06-23 NOTE — Telephone Encounter (Signed)
New Message:    Patient said that Dr Harl Bowie told her to take her blood pressure reading and call them in.  06-16-22-- 138/62     06-17-22-    130/57   06-18-22-    140/60    06-19-22-     142/67    06-20-22-      138/61    06-21-22-       134/48     06-22-22-   forgot to take it    06-23-22-       130/48

## 2022-06-23 NOTE — Telephone Encounter (Signed)
Pt c/o medication issue:  1. Name of Medication: stopped her HCTZ on 06-12-22  2. How are you currently taking this medication (dosage and times per day)?   3. Are you having a reaction (difficulty breathing--STAT)?   4. What is your medication issue? Having aching and tired feeling in her calves, since she stopped taking her HCTZ

## 2022-06-23 NOTE — Telephone Encounter (Signed)
Replied via mychart.

## 2022-06-24 NOTE — Telephone Encounter (Signed)
I think bp's overall are ok, no med changes at this time  Zandra Abts MD

## 2022-06-24 NOTE — Telephone Encounter (Signed)
Patient notified and verbalized understanding. Patient had no questions or concerns at this time.  

## 2022-07-04 ENCOUNTER — Other Ambulatory Visit: Payer: Self-pay | Admitting: Cardiology

## 2022-07-10 DIAGNOSIS — M5136 Other intervertebral disc degeneration, lumbar region: Secondary | ICD-10-CM | POA: Diagnosis not present

## 2022-07-10 DIAGNOSIS — M5442 Lumbago with sciatica, left side: Secondary | ICD-10-CM | POA: Diagnosis not present

## 2022-07-10 DIAGNOSIS — M9905 Segmental and somatic dysfunction of pelvic region: Secondary | ICD-10-CM | POA: Diagnosis not present

## 2022-07-10 DIAGNOSIS — M546 Pain in thoracic spine: Secondary | ICD-10-CM | POA: Diagnosis not present

## 2022-07-10 DIAGNOSIS — M9903 Segmental and somatic dysfunction of lumbar region: Secondary | ICD-10-CM | POA: Diagnosis not present

## 2022-07-10 DIAGNOSIS — M9902 Segmental and somatic dysfunction of thoracic region: Secondary | ICD-10-CM | POA: Diagnosis not present

## 2022-07-12 DIAGNOSIS — M1990 Unspecified osteoarthritis, unspecified site: Secondary | ICD-10-CM | POA: Diagnosis not present

## 2022-07-12 DIAGNOSIS — E559 Vitamin D deficiency, unspecified: Secondary | ICD-10-CM | POA: Diagnosis not present

## 2022-07-12 DIAGNOSIS — M810 Age-related osteoporosis without current pathological fracture: Secondary | ICD-10-CM | POA: Diagnosis not present

## 2022-07-12 DIAGNOSIS — I10 Essential (primary) hypertension: Secondary | ICD-10-CM | POA: Diagnosis not present

## 2022-07-14 ENCOUNTER — Ambulatory Visit: Payer: Medicare HMO | Admitting: Cardiology

## 2022-07-29 DIAGNOSIS — N390 Urinary tract infection, site not specified: Secondary | ICD-10-CM | POA: Diagnosis not present

## 2022-07-29 DIAGNOSIS — Z299 Encounter for prophylactic measures, unspecified: Secondary | ICD-10-CM | POA: Diagnosis not present

## 2022-07-29 DIAGNOSIS — R350 Frequency of micturition: Secondary | ICD-10-CM | POA: Diagnosis not present

## 2022-07-29 DIAGNOSIS — Z789 Other specified health status: Secondary | ICD-10-CM | POA: Diagnosis not present

## 2022-07-29 DIAGNOSIS — I10 Essential (primary) hypertension: Secondary | ICD-10-CM | POA: Diagnosis not present

## 2022-07-29 DIAGNOSIS — Z23 Encounter for immunization: Secondary | ICD-10-CM | POA: Diagnosis not present

## 2022-08-06 DIAGNOSIS — M9903 Segmental and somatic dysfunction of lumbar region: Secondary | ICD-10-CM | POA: Diagnosis not present

## 2022-08-06 DIAGNOSIS — M9905 Segmental and somatic dysfunction of pelvic region: Secondary | ICD-10-CM | POA: Diagnosis not present

## 2022-08-06 DIAGNOSIS — M5442 Lumbago with sciatica, left side: Secondary | ICD-10-CM | POA: Diagnosis not present

## 2022-08-06 DIAGNOSIS — M9902 Segmental and somatic dysfunction of thoracic region: Secondary | ICD-10-CM | POA: Diagnosis not present

## 2022-08-06 DIAGNOSIS — M546 Pain in thoracic spine: Secondary | ICD-10-CM | POA: Diagnosis not present

## 2022-08-06 DIAGNOSIS — M5136 Other intervertebral disc degeneration, lumbar region: Secondary | ICD-10-CM | POA: Diagnosis not present

## 2022-08-07 DIAGNOSIS — Z79899 Other long term (current) drug therapy: Secondary | ICD-10-CM | POA: Diagnosis not present

## 2022-08-07 DIAGNOSIS — Z6824 Body mass index (BMI) 24.0-24.9, adult: Secondary | ICD-10-CM | POA: Diagnosis not present

## 2022-08-07 DIAGNOSIS — I10 Essential (primary) hypertension: Secondary | ICD-10-CM | POA: Diagnosis not present

## 2022-08-07 DIAGNOSIS — Z789 Other specified health status: Secondary | ICD-10-CM | POA: Diagnosis not present

## 2022-08-07 DIAGNOSIS — Z0000 Encounter for general adult medical examination without abnormal findings: Secondary | ICD-10-CM | POA: Diagnosis not present

## 2022-08-07 DIAGNOSIS — M810 Age-related osteoporosis without current pathological fracture: Secondary | ICD-10-CM | POA: Diagnosis not present

## 2022-08-07 DIAGNOSIS — E7800 Pure hypercholesterolemia, unspecified: Secondary | ICD-10-CM | POA: Diagnosis not present

## 2022-08-07 DIAGNOSIS — R5383 Other fatigue: Secondary | ICD-10-CM | POA: Diagnosis not present

## 2022-08-07 DIAGNOSIS — Z299 Encounter for prophylactic measures, unspecified: Secondary | ICD-10-CM | POA: Diagnosis not present

## 2022-08-18 ENCOUNTER — Other Ambulatory Visit: Payer: Self-pay | Admitting: Allergy & Immunology

## 2022-08-23 ENCOUNTER — Encounter (INDEPENDENT_AMBULATORY_CARE_PROVIDER_SITE_OTHER): Payer: Self-pay | Admitting: Gastroenterology

## 2022-08-28 DIAGNOSIS — H43393 Other vitreous opacities, bilateral: Secondary | ICD-10-CM | POA: Diagnosis not present

## 2022-08-28 DIAGNOSIS — H04123 Dry eye syndrome of bilateral lacrimal glands: Secondary | ICD-10-CM | POA: Diagnosis not present

## 2022-08-28 DIAGNOSIS — Z961 Presence of intraocular lens: Secondary | ICD-10-CM | POA: Diagnosis not present

## 2022-09-02 DIAGNOSIS — M5136 Other intervertebral disc degeneration, lumbar region: Secondary | ICD-10-CM | POA: Diagnosis not present

## 2022-09-02 DIAGNOSIS — M5442 Lumbago with sciatica, left side: Secondary | ICD-10-CM | POA: Diagnosis not present

## 2022-09-02 DIAGNOSIS — M9902 Segmental and somatic dysfunction of thoracic region: Secondary | ICD-10-CM | POA: Diagnosis not present

## 2022-09-02 DIAGNOSIS — M9905 Segmental and somatic dysfunction of pelvic region: Secondary | ICD-10-CM | POA: Diagnosis not present

## 2022-09-02 DIAGNOSIS — M546 Pain in thoracic spine: Secondary | ICD-10-CM | POA: Diagnosis not present

## 2022-09-02 DIAGNOSIS — M9903 Segmental and somatic dysfunction of lumbar region: Secondary | ICD-10-CM | POA: Diagnosis not present

## 2022-10-01 DIAGNOSIS — M5136 Other intervertebral disc degeneration, lumbar region: Secondary | ICD-10-CM | POA: Diagnosis not present

## 2022-10-01 DIAGNOSIS — M9905 Segmental and somatic dysfunction of pelvic region: Secondary | ICD-10-CM | POA: Diagnosis not present

## 2022-10-01 DIAGNOSIS — M9902 Segmental and somatic dysfunction of thoracic region: Secondary | ICD-10-CM | POA: Diagnosis not present

## 2022-10-01 DIAGNOSIS — M546 Pain in thoracic spine: Secondary | ICD-10-CM | POA: Diagnosis not present

## 2022-10-01 DIAGNOSIS — M9903 Segmental and somatic dysfunction of lumbar region: Secondary | ICD-10-CM | POA: Diagnosis not present

## 2022-10-01 DIAGNOSIS — M5442 Lumbago with sciatica, left side: Secondary | ICD-10-CM | POA: Diagnosis not present

## 2022-10-09 ENCOUNTER — Other Ambulatory Visit (HOSPITAL_COMMUNITY): Payer: Self-pay | Admitting: Internal Medicine

## 2022-10-09 ENCOUNTER — Ambulatory Visit (HOSPITAL_COMMUNITY)
Admission: RE | Admit: 2022-10-09 | Discharge: 2022-10-09 | Disposition: A | Discharge status: Home / Self Care | Payer: Medicare HMO | Source: Ambulatory Visit | Attending: Internal Medicine | Admitting: Internal Medicine

## 2022-10-09 DIAGNOSIS — I10 Essential (primary) hypertension: Secondary | ICD-10-CM | POA: Diagnosis not present

## 2022-10-09 DIAGNOSIS — M25572 Pain in left ankle and joints of left foot: Secondary | ICD-10-CM

## 2022-10-14 DIAGNOSIS — R0789 Other chest pain: Secondary | ICD-10-CM | POA: Diagnosis not present

## 2022-10-14 DIAGNOSIS — I10 Essential (primary) hypertension: Secondary | ICD-10-CM | POA: Diagnosis not present

## 2022-10-14 DIAGNOSIS — R109 Unspecified abdominal pain: Secondary | ICD-10-CM | POA: Diagnosis not present

## 2022-10-14 DIAGNOSIS — Z299 Encounter for prophylactic measures, unspecified: Secondary | ICD-10-CM | POA: Diagnosis not present

## 2022-10-14 DIAGNOSIS — Z789 Other specified health status: Secondary | ICD-10-CM | POA: Diagnosis not present

## 2022-10-14 DIAGNOSIS — R1912 Hyperactive bowel sounds: Secondary | ICD-10-CM | POA: Diagnosis not present

## 2022-11-05 DIAGNOSIS — M62830 Muscle spasm of back: Secondary | ICD-10-CM | POA: Diagnosis not present

## 2022-11-05 DIAGNOSIS — M542 Cervicalgia: Secondary | ICD-10-CM | POA: Diagnosis not present

## 2022-11-05 DIAGNOSIS — M9901 Segmental and somatic dysfunction of cervical region: Secondary | ICD-10-CM | POA: Diagnosis not present

## 2022-11-05 DIAGNOSIS — M9902 Segmental and somatic dysfunction of thoracic region: Secondary | ICD-10-CM | POA: Diagnosis not present

## 2022-11-05 DIAGNOSIS — M546 Pain in thoracic spine: Secondary | ICD-10-CM | POA: Diagnosis not present

## 2022-11-05 DIAGNOSIS — M9905 Segmental and somatic dysfunction of pelvic region: Secondary | ICD-10-CM | POA: Diagnosis not present

## 2022-11-05 DIAGNOSIS — M9903 Segmental and somatic dysfunction of lumbar region: Secondary | ICD-10-CM | POA: Diagnosis not present

## 2022-11-13 DIAGNOSIS — D692 Other nonthrombocytopenic purpura: Secondary | ICD-10-CM | POA: Diagnosis not present

## 2022-11-13 DIAGNOSIS — R69 Illness, unspecified: Secondary | ICD-10-CM | POA: Diagnosis not present

## 2022-11-13 DIAGNOSIS — I10 Essential (primary) hypertension: Secondary | ICD-10-CM | POA: Diagnosis not present

## 2022-11-13 DIAGNOSIS — Z299 Encounter for prophylactic measures, unspecified: Secondary | ICD-10-CM | POA: Diagnosis not present

## 2022-11-13 DIAGNOSIS — I700 Atherosclerosis of aorta: Secondary | ICD-10-CM | POA: Diagnosis not present

## 2022-12-06 ENCOUNTER — Other Ambulatory Visit: Payer: Self-pay | Admitting: Cardiology

## 2022-12-10 DIAGNOSIS — M9902 Segmental and somatic dysfunction of thoracic region: Secondary | ICD-10-CM | POA: Diagnosis not present

## 2022-12-10 DIAGNOSIS — M9901 Segmental and somatic dysfunction of cervical region: Secondary | ICD-10-CM | POA: Diagnosis not present

## 2022-12-10 DIAGNOSIS — M546 Pain in thoracic spine: Secondary | ICD-10-CM | POA: Diagnosis not present

## 2022-12-10 DIAGNOSIS — M9905 Segmental and somatic dysfunction of pelvic region: Secondary | ICD-10-CM | POA: Diagnosis not present

## 2022-12-10 DIAGNOSIS — M9903 Segmental and somatic dysfunction of lumbar region: Secondary | ICD-10-CM | POA: Diagnosis not present

## 2022-12-10 DIAGNOSIS — M542 Cervicalgia: Secondary | ICD-10-CM | POA: Diagnosis not present

## 2022-12-10 DIAGNOSIS — M62830 Muscle spasm of back: Secondary | ICD-10-CM | POA: Diagnosis not present

## 2022-12-16 ENCOUNTER — Inpatient Hospital Stay: Payer: Medicare HMO | Attending: Hematology

## 2022-12-16 DIAGNOSIS — D591 Autoimmune hemolytic anemia, unspecified: Secondary | ICD-10-CM | POA: Insufficient documentation

## 2022-12-16 DIAGNOSIS — Z85118 Personal history of other malignant neoplasm of bronchus and lung: Secondary | ICD-10-CM | POA: Insufficient documentation

## 2022-12-16 DIAGNOSIS — Z7982 Long term (current) use of aspirin: Secondary | ICD-10-CM | POA: Diagnosis not present

## 2022-12-16 DIAGNOSIS — D590 Drug-induced autoimmune hemolytic anemia: Secondary | ICD-10-CM

## 2022-12-16 DIAGNOSIS — Z7952 Long term (current) use of systemic steroids: Secondary | ICD-10-CM | POA: Insufficient documentation

## 2022-12-16 DIAGNOSIS — I10 Essential (primary) hypertension: Secondary | ICD-10-CM | POA: Diagnosis not present

## 2022-12-16 DIAGNOSIS — E785 Hyperlipidemia, unspecified: Secondary | ICD-10-CM | POA: Insufficient documentation

## 2022-12-16 DIAGNOSIS — Z79899 Other long term (current) drug therapy: Secondary | ICD-10-CM | POA: Insufficient documentation

## 2022-12-16 DIAGNOSIS — R7989 Other specified abnormal findings of blood chemistry: Secondary | ICD-10-CM | POA: Diagnosis not present

## 2022-12-16 LAB — CBC WITH DIFFERENTIAL/PLATELET
Abs Immature Granulocytes: 0.05 10*3/uL (ref 0.00–0.07)
Basophils Absolute: 0.1 10*3/uL (ref 0.0–0.1)
Basophils Relative: 1 %
Eosinophils Absolute: 0.1 10*3/uL (ref 0.0–0.5)
Eosinophils Relative: 1 %
HCT: 33 % — ABNORMAL LOW (ref 36.0–46.0)
Hemoglobin: 11 g/dL — ABNORMAL LOW (ref 12.0–15.0)
Immature Granulocytes: 1 %
Lymphocytes Relative: 6 %
Lymphs Abs: 0.7 10*3/uL (ref 0.7–4.0)
MCH: 22.9 pg — ABNORMAL LOW (ref 26.0–34.0)
MCHC: 33.3 g/dL (ref 30.0–36.0)
MCV: 68.6 fL — ABNORMAL LOW (ref 80.0–100.0)
Monocytes Absolute: 0.5 10*3/uL (ref 0.1–1.0)
Monocytes Relative: 4 %
Neutro Abs: 9.1 10*3/uL — ABNORMAL HIGH (ref 1.7–7.7)
Neutrophils Relative %: 87 %
Platelets: 282 10*3/uL (ref 150–400)
RBC: 4.81 MIL/uL (ref 3.87–5.11)
RDW: 19.2 % — ABNORMAL HIGH (ref 11.5–15.5)
WBC: 10.5 10*3/uL (ref 4.0–10.5)
nRBC: 0.4 % — ABNORMAL HIGH (ref 0.0–0.2)

## 2022-12-16 LAB — FERRITIN: Ferritin: 386 ng/mL — ABNORMAL HIGH (ref 11–307)

## 2022-12-16 LAB — RETICULOCYTES
Immature Retic Fract: 26.1 % — ABNORMAL HIGH (ref 2.3–15.9)
RBC.: 4.72 MIL/uL (ref 3.87–5.11)
Retic Count, Absolute: 127.4 10*3/uL (ref 19.0–186.0)
Retic Ct Pct: 2.7 % (ref 0.4–3.1)

## 2022-12-16 LAB — HEPATIC FUNCTION PANEL
ALT: 14 U/L (ref 0–44)
AST: 28 U/L (ref 15–41)
Albumin: 4.4 g/dL (ref 3.5–5.0)
Alkaline Phosphatase: 59 U/L (ref 38–126)
Bilirubin, Direct: 0.3 mg/dL — ABNORMAL HIGH (ref 0.0–0.2)
Indirect Bilirubin: 1.6 mg/dL — ABNORMAL HIGH (ref 0.3–0.9)
Total Bilirubin: 1.9 mg/dL — ABNORMAL HIGH (ref 0.3–1.2)
Total Protein: 6.8 g/dL (ref 6.5–8.1)

## 2022-12-16 LAB — LACTATE DEHYDROGENASE: LDH: 174 U/L (ref 98–192)

## 2022-12-16 LAB — IRON AND TIBC
Iron: 81 ug/dL (ref 28–170)
Saturation Ratios: 32 % — ABNORMAL HIGH (ref 10.4–31.8)
TIBC: 250 ug/dL (ref 250–450)
UIBC: 169 ug/dL

## 2022-12-19 ENCOUNTER — Ambulatory Visit: Payer: Medicare HMO | Attending: Cardiology | Admitting: Cardiology

## 2022-12-19 ENCOUNTER — Encounter: Payer: Self-pay | Admitting: Cardiology

## 2022-12-19 VITALS — BP 140/60 | HR 64 | Ht 59.0 in | Wt 124.6 lb

## 2022-12-19 DIAGNOSIS — R0989 Other specified symptoms and signs involving the circulatory and respiratory systems: Secondary | ICD-10-CM

## 2022-12-19 DIAGNOSIS — I2510 Atherosclerotic heart disease of native coronary artery without angina pectoris: Secondary | ICD-10-CM

## 2022-12-19 DIAGNOSIS — E782 Mixed hyperlipidemia: Secondary | ICD-10-CM | POA: Diagnosis not present

## 2022-12-19 DIAGNOSIS — I350 Nonrheumatic aortic (valve) stenosis: Secondary | ICD-10-CM | POA: Diagnosis not present

## 2022-12-19 MED ORDER — POTASSIUM CHLORIDE CRYS ER 20 MEQ PO TBCR: 20.0000 meq | EXTENDED_RELEASE_TABLET | ORAL | 3 refills | Status: DC | PRN

## 2022-12-19 NOTE — Patient Instructions (Signed)
Medication Instructions:  Your physician recommends that you continue on your current medications as directed. Please refer to the Current Medication list given to you today.   Labwork: None  Testing/Procedures: Your physician has requested that you have an echocardiogram. Echocardiography is a painless test that uses sound waves to create images of your heart. It provides your doctor with information about the size and shape of your heart and how well your heart's chambers and valves are working. This procedure takes approximately one hour. There are no restrictions for this procedure. Please do NOT wear cologne, perfume, aftershave, or lotions (deodorant is allowed). Please arrive 15 minutes prior to your appointment time.  Your physician has requested that you have a carotid duplex. This test is an ultrasound of the carotid arteries in your neck. It looks at blood flow through these arteries that supply the brain with blood. Allow one hour for this exam. There are no restrictions or special instructions.   Follow-Up: Follow up with Dr. Harl Bowie in 6 months.   Any Other Special Instructions Will Be Listed Below (If Applicable).     If you need a refill on your cardiac medications before your next appointment, please call your pharmacy.

## 2022-12-19 NOTE — Progress Notes (Signed)
Clinical Summary Dawn Lin is a 82 y.o.female seen today for follow up of the following medical problems.      1. CAD - noted on prior CT scsan - 07/2017 nuclear stress: no clear ischemia.  07/2017 echo LVEF 123456, grade I diastolic dysfunction    - no chest pains, no SOB/DOE   2. HTN -  issues with low K on HCTZ, frequent urination - compliant with meds - home bps <140s/60s-70s   3. Hyperlipidemia - she is on on crestor     07/2020 LDL 84 HDL 67 TG 140 TC 175 - 07/2022 TC 198 TG 193 HDL 78 LDL 88      4. Lung adenocarcinoma - s/p resection.    5. SOB - 03/2017 PFTs: minimal perfusion defect      6. Mild aortic stenosis 07/2017 echo  mean grad 16, AVA VTI 1.54 would suggest mild AS.  - no recent symptoms.    02/2021 echo LVEF 60-65%, no WMAs, mild to mod AS mean grad 15, AVA VTI, 1.15, DI 0.33       7. Vision change - episode of vision change, lightheadedness while driving - seen at [place].  - extensive workup in ER was unremarkable. 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 Followed by heme/onc       SH: Sister is Dawn Lin who is also coming to establish in our clinic Past Medical History:  Diagnosis Date   Anemia    Anxiety    Dyspnea    with exertion   Essential hypertension, benign since the 1900's   Family history of adverse reaction to anesthesia    Children - N/V   History of blood transfusion    Hyperlipidemia    Other osteoporosis    Other thalassemia (HCC)      Allergies  Allergen Reactions   Lisinopril Cough     Current Outpatient Medications  Medication Sig Dispense Refill   acetaminophen (TYLENOL) 500 MG tablet Take 1 tablet (500 mg total) by mouth every 6 (six) hours as needed. 30 tablet 0   Alpha-D-Galactosidase (BEANO PO) Take 1 capsule by mouth daily as needed (bloating).     amLODipine (NORVASC) 10 MG tablet Take 10 mg by mouth daily.     Ascorbic Acid  (VITAMIN C) 500 MG CHEW Chew 500 mg by mouth daily.     aspirin EC 81 MG tablet Take 81 mg by mouth daily.     benzonatate (TESSALON) 100 MG capsule Take 1 capsule (100 mg total) by mouth 3 (three) times daily as needed for cough. 30 capsule 0   Calcium-Phosphorus-Vitamin D [id number] MG-MG-UNIT CHEW Chew 1 each by mouth 2 (two) times daily.      fexofenadine (ALLEGRA) 180 MG tablet Take 180 mg by mouth daily as needed for allergies.     folic acid (FOLVITE) 1 MG tablet Take 2 mg by mouth daily.     furosemide (LASIX) 20 MG tablet TAKE 1 TABLET (20 MG TOTAL) BY MOUTH AS NEEDED (FOR SWELLING). 90 tablet 1   guaiFENesin (MUCINEX) 600 MG 12 hr tablet Take 600 mg by mouth 2 (two) times daily as needed for cough or to loosen phlegm.     ibuprofen (ADVIL) 200 MG tablet Take 200 mg by mouth every 6 (six) hours as needed for moderate pain.     ipratropium (ATROVENT) 0.03 % nasal spray PLACE 2 SPRAYS INTO BOTH NOSTRILS 3 (THREE) TIMES  DAILY 90 mL 1   meclizine (ANTIVERT) 12.5 MG tablet Take 12.5 mg by mouth every 6 (six) hours as needed for dizziness.     Multiple Vitamin (MULTIVITAMIN WITH MINERALS) TABS tablet Take 1 tablet by mouth daily.     potassium chloride SA (KLOR-CON M) 20 MEQ tablet Take 20 mEq by mouth as needed (when taking the lasix for swelling).     predniSONE (DELTASONE) 5 MG tablet Take 5 mg by mouth daily with breakfast.     rosuvastatin (CRESTOR) 5 MG tablet TAKE ONE TABLET BY MOUTH ONCE DAILY 90 tablet 2   simethicone (MYLICON) 80 MG chewable tablet Chew 80 mg by mouth every 6 (six) hours as needed for flatulence.     No current facility-administered medications for this visit.     Past Surgical History:  Procedure Laterality Date   BIOPSY  12/28/2020   Procedure: BIOPSY;  Surgeon: Harvel Quale, MD;  [facility];  Service: Gastroenterology;;   COLONOSCOPY     COLONOSCOPY WITH PROPOFOL N/A 12/28/2020   Procedure: COLONOSCOPY WITH PROPOFOL;  Surgeon:  Harvel Quale, MD;  [facility];  Service: Gastroenterology;  Laterality: N/A;  AM   ESOPHAGOGASTRODUODENOSCOPY (EGD) WITH PROPOFOL N/A 12/28/2020   Procedure: ESOPHAGOGASTRODUODENOSCOPY (EGD) WITH PROPOFOL;  Surgeon: Harvel Quale, MD;  [facility];  Service: Gastroenterology;  Laterality: N/A;   INGUINAL HERNIA REPAIR Left    LUMBAR [place] SURGERY  1990's   for severe pain and sciatica   PARTIAL HYSTERECTOMY  1970's   for bleeding and prolapse   VIDEO ASSISTED THORACOSCOPY (VATS)/ LOBECTOMY Right 04/20/2017   Procedure: VIDEO ASSISTED THORACOSCOPY (VATS)/RIGHT UPPER LOBECTOMY;  Surgeon: Grace Isaac, MD;  Location: [facility];  Service: Thoracic;  Laterality: Right;   VIDEO BRONCHOSCOPY N/A 04/20/2017   Procedure: VIDEO BRONCHOSCOPY;  Surgeon: Grace Isaac, MD;  Location: [facility] OR;  Service: Thoracic;  Laterality: N/A;     Allergies  Allergen Reactions   Lisinopril Cough      Family History  Problem Relation Age of Onset   Hypertension Mother    Asthma Sister    Heart attack Sister 17     Social History Dawn Lin reports that she has never smoked. She has never used smokeless tobacco. Dawn Lin reports no history of alcohol use.   Review of Systems CONSTITUTIONAL: No weight loss, fever, chills, weakness or fatigue.  HEENT: Eyes: No visual loss, blurred vision, double vision or yellow sclerae.No hearing loss, sneezing, congestion, runny nose or sore throat.  SKIN: No rash or itching.  CARDIOVASCULAR: per hpi RESPIRATORY: No shortness of breath, cough or sputum.  GASTROINTESTINAL: No anorexia, nausea, vomiting or diarrhea. No abdominal pain or blood.  GENITOURINARY: No burning on urination, no polyuria NEUROLOGICAL: No headache, dizziness, syncope, paralysis, ataxia, numbness or tingling in the extremities. No change in bowel or bladder control.  MUSCULOSKELETAL: No muscle, back pain, joint pain or stiffness.  LYMPHATICS:  No enlarged nodes. No history of splenectomy.  PSYCHIATRIC: No history of depression or anxiety.  ENDOCRINOLOGIC: No reports of sweating, cold or heat intolerance. No polyuria or polydipsia.  Marland Kitchen   Physical Examination Today's Vitals   12/19/22 1052 12/19/22 1135  BP: (!) 152/62 (!) 140/60  Pulse: 64   SpO2: 96%   Weight: 124 lb 9.6 oz (56.5 kg)   Height: '4\' 11"'$  (1.499 m)    Body mass index is 25.17 kg/m.  Gen: resting comfortably, no acute distress HEENT: no scleral  icterus, pupils equal round and reactive, no palptable cervical adenopathy,  CV: RRR, 3/6 systolic murmur rusb. Bilateral carotid bruits Resp: Clear to auscultation bilaterally GI: abdomen is soft, non-tender, non-distended, normal bowel sounds, no hepatosplenomegaly MSK: extremities are warm, no edema.  Skin: warm, no rash Neuro:  no focal deficits Psych: appropriate affect   Diagnostic Studies  07/2018 nuclear stress Nuclear stress EF: 80%. There was no ST segment deviation noted during stress. There is a large defect of moderate severity present in the basal anteroseptal, basal inferoseptal, mid anteroseptal, mid inferoseptal, apical septal and apex location. The defect is non-reversible. In the setting of normal LVF, this is likely due to breast attenuation artifact. No ischemia noted. This is a low risk study. The left ventricular ejection fraction is hyperdynamic (>65%).     07/2017 echo Study Conclusions   - Left ventricle: The cavity size was normal. Wall thickness was   normal. Systolic function was normal. The estimated ejection   fraction was in the range of 60% to 65%. Wall motion was normal;   there were no regional wall motion abnormalities. Doppler   parameters are consistent with abnormal left ventricular   relaxation (grade 1 diastolic dysfunction). - Aortic valve: There was trivial regurgitation.     02/2021 echo  1. Left ventricular ejection fraction, by estimation, is 60 to 65%. The   left ventricle has normal function. Left ventricular endocardial border  not optimally defined to evaluate regional wall motion. Left ventricular  diastolic parameters are  indeterminate. Elevated left atrial pressure.   2. Right ventricular systolic function is normal. The right ventricular  size is normal.   3. The mitral valve is normal in structure. No evidence of mitral valve  regurgitation. No evidence of mitral stenosis.   4. The aortic valve is tricuspid. There is mild calcification of the  aortic valve. There is mild thickening of the aortic valve. Aortic valve  regurgitation is mild. Mild to moderate aortic valve stenosis .Aortic  valve mean gradient measures 15.0 mmHg.  Aortic valve peak gradient measures 33.7 mmHg. Aortic valve area, by VTI  measures 1.15 cm.   5. The inferior vena cava is normal in size with greater than 50%  respiratory variability, suggesting right atrial pressure of 3 mmHg.      Assessment and Plan  1. CAD - noted by CT scan, nuclear stress test shows no significnat ischemia - no recent symptoms, continue to monitor   2. Hyperlipidemia -LDL at goal, continue current meds. Discussed dietary changes to lower TGs   3. Aortic stenosis - mild to mod AS by echo - we will repeat echo   4. HTN - recurrent issues with low K, frequent urination on HCTZ. HCTZ was stopped - bp today 140/60. At advanced age and wife pulse pressure I think reasonable overall control for her at this time, continue current meds   5. Carotid bruits Check carotid US     Arnoldo Lenis, M.D.

## 2022-12-22 NOTE — Progress Notes (Signed)
Dawn Lin [address] [place], [place] [address]    Clinic Day:  12/23/2022  Referring physician: Ignatius Specking, MD  Patient Care Team: Ignatius Specking, MD as PCP - General (Internal Medicine) Wyline Mood Dorothe Pea, MD as PCP - Cardiology (Cardiology) Doreatha Massed, MD as Medical Oncologist (Hematology)   ASSESSMENT & PLAN:   Assessment: Autoimmune hemolytic anemia: - Diagnosed with AIHA with positive DAT on 09/24/2021. - Status post rituximab weekly x4 completed on 11/28/2021.  She had relapse of hemolysis when prednisone was discontinued.  Currently on prednisone 5 mg daily treated by Dr Youlanda Mighty at [facility].  She is switching care due to proximity. - She also has hereditary beta thalassemia trait (11/13/2020 hemoglobin electrophoresis: HbA-91.8%, HbA2-4.9%, HbF-3.3%) and reports her normal hemoglobin is between 10 and 11.    Social/family history: - She lives by herself at home.  She retired from office job.  She also worked at [employer] traffic [employer] medicine residency program in [place].  Her half-sister is Dawn Lin who is our patient.  She also worked on at tobacco farm.  She has exposure to [employer] and other fertilizers.  She was never smoker. - Half sister has breast cancer.  Does not know her paternal side of the family.  3.  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 - Reviewed CT chest without contrast from 06/10/2022 which showed no evidence of recurrence or metastatic disease. - No need of further scans.  Plan: Autoimmune hemolytic anemia: - Reviewed labs from 12/16/2022 which showed elevated total bilirubin 1.9, mostly indirect of 1.6.  LDH is normal.  Hemoglobin stable at 11.  Reticulocyte is 2.7%. - Continue prednisone 5 mg daily and folic acid 2 mg daily. - RTC 6 months with repeat labs.  Will check direct Coombs test at that time.  2.  Elevated ferritin: - Ferritin today  has improved to 386 from 569 previously.    Orders Placed This Encounter  Procedures   CBC    Standing Status:   Future    Standing Expiration Date:   12/23/2023   Lactate dehydrogenase    Standing Status:   Future    Standing Expiration Date:   12/23/2023   Iron and TIBC ([facility] ONLY)    Standing Status:   Future    Standing Expiration Date:   12/23/2023   Ferritin    Standing Status:   Future    Standing Expiration Date:   12/23/2023   Reticulocytes    Standing Status:   Future    Standing Expiration Date:   12/23/2023   Hepatic function panel    Standing Status:   Future    Standing Expiration Date:   12/23/2023   Direct antiglobulin test    Standing Status:   Future    Standing Expiration Date:   12/23/2023      I,Alexis Herring,acting as a scribe for Doreatha Massed, MD.,have documented all relevant documentation on the behalf of Doreatha Massed, MD,as directed by  Doreatha Massed, MD while in the presence of Doreatha Massed, MD.   I, Doreatha Massed MD, have reviewed the above documentation for accuracy and completeness, and I agree with the above.   Doreatha Massed, MD   3/12/20246:06 PM  CHIEF COMPLAINT:   Diagnosis: autoimmune hemolytic anemia    Cancer Staging  Malignant neoplasm of right upper lobe of lung (HCC) Staging form: Lung, AJCC 8th Edition - Pathologic  stage from 04/21/2017: Stage IA2 (pT1b, pN0, cM0) - Signed by Delight Ovens, MD on 04/22/2017    Prior Therapy: none  Current Therapy:  surveillance   INTERVAL HISTORY:   Dawn Lin is a 82 y.o. female presenting to clinic today for follow up of autoimmune hemolytic anemia. She was last seen by me on 06/17/22.  Today, she states that she is doing well overall. Her appetite level is at 60%. Her energy level is at 40%. She denies any bleeding per rectum or melena.   PAST MEDICAL HISTORY:   Past Medical History: Past Medical History:  Diagnosis Date    Anemia    Anxiety    Dyspnea    with exertion   Essential hypertension, benign since the 1900's   Family history of adverse reaction to anesthesia    Children - N/V   History of blood transfusion    Hyperlipidemia    Other osteoporosis    Other thalassemia (HCC)     Surgical History: Past Surgical History:  Procedure Laterality Date   BIOPSY  12/28/2020   Procedure: BIOPSY;  Surgeon: Dolores Frame, MD;  [facility];  Service: Gastroenterology;;   COLONOSCOPY     COLONOSCOPY WITH PROPOFOL N/A 12/28/2020   Procedure: COLONOSCOPY WITH PROPOFOL;  Surgeon: Dolores Frame, MD;  [facility];  Service: Gastroenterology;  Laterality: N/A;  AM   ESOPHAGOGASTRODUODENOSCOPY (EGD) WITH PROPOFOL N/A 12/28/2020   Procedure: ESOPHAGOGASTRODUODENOSCOPY (EGD) WITH PROPOFOL;  Surgeon: Dolores Frame, MD;  [facility];  Service: Gastroenterology;  Laterality: N/A;   INGUINAL HERNIA REPAIR Left    LUMBAR DISC SURGERY  1990's   for severe pain and sciatica   PARTIAL HYSTERECTOMY  1970's   for bleeding and prolapse   VIDEO ASSISTED THORACOSCOPY (VATS)/ LOBECTOMY Right 04/20/2017   Procedure: VIDEO ASSISTED THORACOSCOPY (VATS)/RIGHT UPPER LOBECTOMY;  Surgeon: Delight Ovens, MD;  Location: [facility] OR;  Service: Thoracic;  Laterality: Right;   VIDEO BRONCHOSCOPY N/A 04/20/2017   Procedure: VIDEO BRONCHOSCOPY;  Surgeon: Delight Ovens, MD;  Location: [facility] OR;  Service: Thoracic;  Laterality: N/A;    Social History: Social History   Socioeconomic History   Marital status: Widowed    Spouse name: Not on file   Number of children: Not on file   Years of education: Not on file   Highest education level: Not on file  Occupational History   Occupation: retired  Tobacco Use   Smoking status: Never   Smokeless tobacco: Never  Vaping Use   Vaping Use: Never used  Substance and Sexual Activity   Alcohol use: No   Drug use: No    Sexual activity: Never  Other Topics Concern   Not on file  Social History Narrative   Dawn Lin is married. Her husband has been chronically ill for 6 years (2011). He has metastatic prostate cancer and multiple myeloma. 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 She has a lot of family support.    Social Determinants of Health   Financial Resource Strain: Not on file  Food Insecurity: Not on file  Transportation Needs: Not on file  Physical Activity: Not on file  Stress: Not on file  Social Connections: Not on file  Intimate Partner Violence: Not on file    Family History: Family History  Problem Relation Age of Onset   Hypertension Mother    Asthma Sister    Heart attack Sister 50  Current Medications:  Current Outpatient Medications:    acetaminophen (TYLENOL) 500 MG tablet, Take 1 tablet (500 mg total) by mouth every 6 (six) hours as needed., Disp: 30 tablet, Rfl: 0   Alpha-D-Galactosidase (BEANO PO), Take 1 capsule by mouth daily as needed (bloating)., Disp: , Rfl:    amLODipine (NORVASC) 10 MG tablet, Take 10 mg by mouth daily., Disp: , Rfl:    Ascorbic Acid (VITAMIN C) 500 MG CHEW, Chew 500 mg by mouth daily., Disp: , Rfl:    aspirin EC 81 MG tablet, Take 81 mg by mouth daily., Disp: , Rfl:    benzonatate (TESSALON) 100 MG capsule, Take 1 capsule (100 mg total) by mouth 3 (three) times daily as needed for cough., Disp: 30 capsule, Rfl: 0   Calcium-Phosphorus-Vitamin D 250-115-250 MG-MG-UNIT CHEW, Chew 1 each by mouth 2 (two) times daily. , Disp: , Rfl:    fexofenadine (ALLEGRA) 180 MG tablet, Take 180 mg by mouth daily as needed for allergies., Disp: , Rfl:    folic acid (FOLVITE) 1 MG tablet, Take 2 mg by mouth daily., Disp: , Rfl:    furosemide (LASIX) 20 MG tablet, TAKE 1 TABLET (20 MG TOTAL) BY MOUTH AS NEEDED (FOR SWELLING)., Disp: 90 tablet, Rfl: 1   guaiFENesin (MUCINEX) 600 MG 12 hr tablet, Take 600 mg by mouth 2 (two) times daily as needed for  cough or to loosen phlegm., Disp: , Rfl:    ibuprofen (ADVIL) 200 MG tablet, Take 200 mg by mouth every 6 (six) hours as needed for moderate pain., Disp: , Rfl:    ipratropium (ATROVENT) 0.03 % nasal spray, PLACE 2 SPRAYS INTO BOTH NOSTRILS 3 (THREE) TIMES DAILY, Disp: 90 mL, Rfl: 1   meclizine (ANTIVERT) 12.5 MG tablet, Take 12.5 mg by mouth every 6 (six) hours as needed for dizziness., Disp: , Rfl:    Multiple Vitamin (MULTIVITAMIN WITH MINERALS) TABS tablet, Take 1 tablet by mouth daily., Disp: , Rfl:    potassium chloride SA (KLOR-CON M) 20 MEQ tablet, Take 1 tablet (20 mEq total) by mouth as needed (when taking the lasix for swelling)., Disp: 90 tablet, Rfl: 3   predniSONE (DELTASONE) 5 MG tablet, Take 5 mg by mouth daily with breakfast., Disp: , Rfl:    rosuvastatin (CRESTOR) 5 MG tablet, TAKE ONE TABLET BY MOUTH ONCE DAILY, Disp: 90 tablet, Rfl: 2   simethicone (MYLICON) 80 MG chewable tablet, Chew 80 mg by mouth every 6 (six) hours as needed for flatulence. (Patient not taking: Reported on 12/23/2022), Disp: , Rfl:    Allergies: Allergies  Allergen Reactions   Lisinopril Cough    REVIEW OF SYSTEMS:   Review of Systems  Constitutional:  Negative for chills, fatigue and fever.  HENT:   Negative for lump/mass, mouth sores, nosebleeds, sore throat and trouble swallowing.   Eyes:  Negative for eye problems.  Respiratory:  Negative for cough and shortness of breath.   Cardiovascular:  Negative for chest pain, leg swelling and palpitations.  Gastrointestinal:  Negative for abdominal pain, constipation, diarrhea, nausea and vomiting.  Genitourinary:  Negative for bladder incontinence, difficulty urinating, dysuria, frequency, hematuria and nocturia.   Musculoskeletal:  Negative for arthralgias, back pain, flank pain, myalgias and neck pain.  Skin:  Negative for itching and rash.  Neurological:  Positive for dizziness and numbness. Negative for headaches.  Hematological:  Does not  bruise/bleed easily.  Psychiatric/Behavioral:  Positive for sleep disturbance. Negative for depression and suicidal ideas. The patient is not nervous/anxious.  All other systems reviewed and are negative.    VITALS:   Blood pressure (!) 139/53, pulse 69, temperature 98.9 F (37.2 C), temperature source Tympanic, resp. rate 18, height 4\' 11"  (1.499 m), weight 125 lb (56.7 kg), SpO2 100 %.  Wt Readings from Last 3 Encounters:  12/23/22 125 lb (56.7 kg)  12/19/22 124 lb 9.6 oz (56.5 kg)  06/17/22 124 lb (56.2 kg)    Body mass index is 25.25 kg/m.  PHYSICAL EXAM:   Physical Exam Vitals and nursing note reviewed. Exam conducted with a chaperone present.  Constitutional:      Appearance: Normal appearance.  Cardiovascular:     Rate and Rhythm: Normal rate and regular rhythm.     Pulses: Normal pulses.     Heart sounds: Normal heart sounds.  Pulmonary:     Effort: Pulmonary effort is normal.     Breath sounds: Normal breath sounds.  Abdominal:     Palpations: Abdomen is soft. There is no hepatomegaly, splenomegaly or mass.     Tenderness: There is no abdominal tenderness.  Musculoskeletal:     Right lower leg: No edema.     Left lower leg: No edema.  Lymphadenopathy:     Cervical: No cervical adenopathy.     Right cervical: No superficial, deep or posterior cervical adenopathy.    Left cervical: No superficial, deep or posterior cervical adenopathy.     Upper Body:     Right upper body: No supraclavicular or axillary adenopathy.     Left upper body: No supraclavicular or axillary adenopathy.  Neurological:     General: No focal deficit present.     Mental Status: She is alert and oriented to person, place, and time.  Psychiatric:        Mood and Affect: Mood normal.        Behavior: Behavior normal.     LABS:      Latest Ref Rng & Units 12/16/2022   11:14 AM 06/10/2022    9:56 AM 03/06/2022   12:11 PM  CBC  WBC 4.0 - 10.5 K/uL 10.5  9.2  11.0   Hemoglobin 12.0 -  15.0 g/dL 01.6  01.0  93.2   Hematocrit 36.0 - 46.0 % 33.0  34.3  33.7   Platelets 150 - 400 K/uL 282  303  305       Latest Ref Rng & Units 12/16/2022   11:14 AM 06/10/2022    9:56 AM 03/06/2022   12:11 PM  CMP  Glucose 70 - 99 mg/dL  89  355   BUN 8 - 23 mg/dL  17  21   Creatinine 7.32 - 1.00 mg/dL  2.02  5.42   Sodium 706 - 145 mmol/L  139  136   Potassium 3.5 - 5.1 mmol/L  3.1  3.4   Chloride 98 - 111 mmol/L  105  103   CO2 22 - 32 mmol/L  27  28   Calcium 8.9 - 10.3 mg/dL  9.2  9.0   Total Protein 6.5 - 8.1 g/dL 6.8  6.5  6.5   Total Bilirubin 0.3 - 1.2 mg/dL 1.9  2.1  2.0   Alkaline Phos 38 - 126 U/L 59  51  54   AST 15 - 41 U/L 28  25  22    ALT 0 - 44 U/L 14  13  9       No results found for: "CEA1", "CEA" / No results found for: "CEA1", "CEA" No results  found for: "PSA1" No results found for: "ZOX096" No results found for: "CAN125"  No results found for: "TOTALPROTELP", "ALBUMINELP", "A1GS", "A2GS", "BETS", "BETA2SER", "GAMS", "MSPIKE", "SPEI" Lab Results  Component Value Date   TIBC 250 12/16/2022   TIBC 231 (L) 01/24/2022   FERRITIN 386 (H) 12/16/2022   FERRITIN 569 (H) 01/24/2022   IRONPCTSAT 32 (H) 12/16/2022   IRONPCTSAT 56 (H) 01/24/2022   Lab Results  Component Value Date   LDH 174 12/16/2022   LDH 188 06/10/2022   LDH 165 03/06/2022     STUDIES:   No results found.

## 2022-12-23 ENCOUNTER — Inpatient Hospital Stay: Payer: Medicare HMO | Admitting: Hematology

## 2022-12-23 VITALS — BP 139/53 | HR 69 | Temp 98.9°F | Resp 18 | Ht 59.0 in | Wt 125.0 lb

## 2022-12-23 DIAGNOSIS — Z85118 Personal history of other malignant neoplasm of bronchus and lung: Secondary | ICD-10-CM | POA: Diagnosis not present

## 2022-12-23 DIAGNOSIS — D590 Drug-induced autoimmune hemolytic anemia: Secondary | ICD-10-CM | POA: Diagnosis not present

## 2022-12-23 DIAGNOSIS — I10 Essential (primary) hypertension: Secondary | ICD-10-CM | POA: Diagnosis not present

## 2022-12-23 DIAGNOSIS — D591 Autoimmune hemolytic anemia, unspecified: Secondary | ICD-10-CM | POA: Diagnosis not present

## 2022-12-23 DIAGNOSIS — Z7982 Long term (current) use of aspirin: Secondary | ICD-10-CM | POA: Diagnosis not present

## 2022-12-23 DIAGNOSIS — Z7952 Long term (current) use of systemic steroids: Secondary | ICD-10-CM | POA: Diagnosis not present

## 2022-12-23 DIAGNOSIS — Z79899 Other long term (current) drug therapy: Secondary | ICD-10-CM | POA: Diagnosis not present

## 2022-12-23 DIAGNOSIS — E785 Hyperlipidemia, unspecified: Secondary | ICD-10-CM | POA: Diagnosis not present

## 2022-12-23 DIAGNOSIS — R7989 Other specified abnormal findings of blood chemistry: Secondary | ICD-10-CM | POA: Diagnosis not present

## 2022-12-23 NOTE — Patient Instructions (Signed)
[facility] at [facility] Discharge Instructions   You were seen and examined today by Dr. Katragadda.  He reviewed the results of your lab work which are normal/stable.   We will see you back in 6 months. We will repeat lab work prior to your next visit.    Thank you for choosing [facility] at [facility] to provide your oncology and hematology care.  To afford each patient quality time with our provider, please arrive at least 15 minutes before your scheduled appointment time.   If you have a lab appointment with the Cancer Center please come in thru the Main Entrance and check in at the main information desk.  You need to re-schedule your appointment should you arrive 10 or more minutes late.  We strive to give you quality time with our providers, and arriving late affects you and other patients whose appointments are after yours.  Also, if you no show three or more times for appointments you may be dismissed from the clinic at the providers discretion.     Again, thank you for choosing [facility].  Our hope is that these requests will decrease the amount of time that you wait before being seen by our physicians.       _____________________________________________________________  Should you have questions after your visit to [facility], please contact our office at [phone] and follow the prompts.  Our office hours are 8:00 a.m. and 4:30 p.m. Monday - Friday.  Please note that voicemails left after 4:00 p.m. may not be returned until the following business day.  We are closed weekends and major holidays.  You do have access to a nurse 24-7, just call the main number to the clinic [phone] and do not press any options, hold on the line and a nurse will answer the phone.    For prescription refill requests, have your pharmacy contact our office and allow 72 hours.    Due to Covid, you will need to wear a mask  upon entering the hospital. If you do not have a mask, a mask will be given to you at the Main Entrance upon arrival. For doctor visits, patients may have 1 support person age 18 or older with them. For treatment visits, patients can not have anyone with them due to social distancing guidelines and our immunocompromised population.      

## 2023-01-07 DIAGNOSIS — M9903 Segmental and somatic dysfunction of lumbar region: Secondary | ICD-10-CM | POA: Diagnosis not present

## 2023-01-07 DIAGNOSIS — M9905 Segmental and somatic dysfunction of pelvic region: Secondary | ICD-10-CM | POA: Diagnosis not present

## 2023-01-07 DIAGNOSIS — M546 Pain in thoracic spine: Secondary | ICD-10-CM | POA: Diagnosis not present

## 2023-01-07 DIAGNOSIS — M542 Cervicalgia: Secondary | ICD-10-CM | POA: Diagnosis not present

## 2023-01-07 DIAGNOSIS — M9902 Segmental and somatic dysfunction of thoracic region: Secondary | ICD-10-CM | POA: Diagnosis not present

## 2023-01-07 DIAGNOSIS — M9901 Segmental and somatic dysfunction of cervical region: Secondary | ICD-10-CM | POA: Diagnosis not present

## 2023-01-07 DIAGNOSIS — M62830 Muscle spasm of back: Secondary | ICD-10-CM | POA: Diagnosis not present

## 2023-01-15 ENCOUNTER — Ambulatory Visit (HOSPITAL_COMMUNITY)
Admission: RE | Admit: 2023-01-15 | Discharge: 2023-01-15 | Disposition: A | Discharge status: Home / Self Care | Payer: Medicare HMO | Source: Ambulatory Visit | Attending: Cardiology | Admitting: Cardiology

## 2023-01-15 DIAGNOSIS — I350 Nonrheumatic aortic (valve) stenosis: Secondary | ICD-10-CM | POA: Insufficient documentation

## 2023-01-15 DIAGNOSIS — R0989 Other specified symptoms and signs involving the circulatory and respiratory systems: Secondary | ICD-10-CM | POA: Diagnosis not present

## 2023-01-15 DIAGNOSIS — I6523 Occlusion and stenosis of bilateral carotid arteries: Secondary | ICD-10-CM | POA: Diagnosis not present

## 2023-01-15 LAB — ECHOCARDIOGRAM COMPLETE
AR max vel: 0.92 cm2
AV Area VTI: 0.98 cm2
AV Area mean vel: 1.02 cm2
AV Mean grad: 33 mmHg
AV Peak grad: 58.1 mmHg
Ao pk vel: 3.81 m/s
Area-P 1/2: 2.62 cm2
P 1/2 time: 337 msec
S' Lateral: 2.8 cm

## 2023-01-15 NOTE — Progress Notes (Signed)
*  PRELIMINARY RESULTS* Echocardiogram 2D Echocardiogram has been performed.  Samuel Germany 01/15/2023, 1:27 PM

## 2023-01-26 ENCOUNTER — Telehealth: Payer: Self-pay | Admitting: Cardiology

## 2023-01-26 ENCOUNTER — Telehealth: Payer: Self-pay

## 2023-01-26 NOTE — Telephone Encounter (Signed)
-----   Message from Antoine Poche, MD sent at 01/26/2023 12:49 PM EDT ----- Echo shows heart pumping function remains normal. The arotic valve has a moderate leak and also is moderate to severely stiff/stenotic. Nothing in the range that would require intervention, something to continue to monitor at this time with a repeat echo in 1 year  Dominga Ferry MD

## 2023-01-26 NOTE — Telephone Encounter (Signed)
Patient notified

## 2023-01-26 NOTE — Telephone Encounter (Signed)
Patient notified and verbalized understanding. Patient had no questions or concerns at this time. PCP copied 

## 2023-01-26 NOTE — Telephone Encounter (Signed)
  Pt is calling to follow up her echo result. She said, if she unable to answer the phone to leave her detailed message

## 2023-01-26 NOTE — Telephone Encounter (Addendum)
I will forward to provider to result echo,pt aware.

## 2023-02-03 DIAGNOSIS — M1990 Unspecified osteoarthritis, unspecified site: Secondary | ICD-10-CM | POA: Diagnosis not present

## 2023-02-03 DIAGNOSIS — J309 Allergic rhinitis, unspecified: Secondary | ICD-10-CM | POA: Diagnosis not present

## 2023-02-03 DIAGNOSIS — I700 Atherosclerosis of aorta: Secondary | ICD-10-CM | POA: Diagnosis not present

## 2023-02-03 DIAGNOSIS — E785 Hyperlipidemia, unspecified: Secondary | ICD-10-CM | POA: Diagnosis not present

## 2023-02-03 DIAGNOSIS — M810 Age-related osteoporosis without current pathological fracture: Secondary | ICD-10-CM | POA: Diagnosis not present

## 2023-02-03 DIAGNOSIS — I2510 Atherosclerotic heart disease of native coronary artery without angina pectoris: Secondary | ICD-10-CM | POA: Diagnosis not present

## 2023-02-03 DIAGNOSIS — M5440 Lumbago with sciatica, unspecified side: Secondary | ICD-10-CM | POA: Diagnosis not present

## 2023-02-03 DIAGNOSIS — N182 Chronic kidney disease, stage 2 (mild): Secondary | ICD-10-CM | POA: Diagnosis not present

## 2023-02-03 DIAGNOSIS — K219 Gastro-esophageal reflux disease without esophagitis: Secondary | ICD-10-CM | POA: Diagnosis not present

## 2023-02-03 DIAGNOSIS — Z8249 Family history of ischemic heart disease and other diseases of the circulatory system: Secondary | ICD-10-CM | POA: Diagnosis not present

## 2023-02-03 DIAGNOSIS — D569 Thalassemia, unspecified: Secondary | ICD-10-CM | POA: Diagnosis not present

## 2023-02-03 DIAGNOSIS — I129 Hypertensive chronic kidney disease with stage 1 through stage 4 chronic kidney disease, or unspecified chronic kidney disease: Secondary | ICD-10-CM | POA: Diagnosis not present

## 2023-02-04 DIAGNOSIS — M9905 Segmental and somatic dysfunction of pelvic region: Secondary | ICD-10-CM | POA: Diagnosis not present

## 2023-02-04 DIAGNOSIS — M9902 Segmental and somatic dysfunction of thoracic region: Secondary | ICD-10-CM | POA: Diagnosis not present

## 2023-02-04 DIAGNOSIS — M9901 Segmental and somatic dysfunction of cervical region: Secondary | ICD-10-CM | POA: Diagnosis not present

## 2023-02-04 DIAGNOSIS — M542 Cervicalgia: Secondary | ICD-10-CM | POA: Diagnosis not present

## 2023-02-04 DIAGNOSIS — M62830 Muscle spasm of back: Secondary | ICD-10-CM | POA: Diagnosis not present

## 2023-02-04 DIAGNOSIS — M9903 Segmental and somatic dysfunction of lumbar region: Secondary | ICD-10-CM | POA: Diagnosis not present

## 2023-02-04 DIAGNOSIS — M546 Pain in thoracic spine: Secondary | ICD-10-CM | POA: Diagnosis not present

## 2023-03-31 ENCOUNTER — Other Ambulatory Visit: Payer: Self-pay | Admitting: Cardiology

## 2023-04-01 DIAGNOSIS — M9902 Segmental and somatic dysfunction of thoracic region: Secondary | ICD-10-CM | POA: Diagnosis not present

## 2023-04-01 DIAGNOSIS — M62830 Muscle spasm of back: Secondary | ICD-10-CM | POA: Diagnosis not present

## 2023-04-01 DIAGNOSIS — M9901 Segmental and somatic dysfunction of cervical region: Secondary | ICD-10-CM | POA: Diagnosis not present

## 2023-04-01 DIAGNOSIS — M542 Cervicalgia: Secondary | ICD-10-CM | POA: Diagnosis not present

## 2023-04-01 DIAGNOSIS — M546 Pain in thoracic spine: Secondary | ICD-10-CM | POA: Diagnosis not present

## 2023-04-01 DIAGNOSIS — M9905 Segmental and somatic dysfunction of pelvic region: Secondary | ICD-10-CM | POA: Diagnosis not present

## 2023-04-01 DIAGNOSIS — M9903 Segmental and somatic dysfunction of lumbar region: Secondary | ICD-10-CM | POA: Diagnosis not present

## 2023-04-08 DIAGNOSIS — Z7189 Other specified counseling: Secondary | ICD-10-CM | POA: Diagnosis not present

## 2023-04-08 DIAGNOSIS — Z299 Encounter for prophylactic measures, unspecified: Secondary | ICD-10-CM | POA: Diagnosis not present

## 2023-04-08 DIAGNOSIS — Z0000 Encounter for general adult medical examination without abnormal findings: Secondary | ICD-10-CM | POA: Diagnosis not present

## 2023-04-08 DIAGNOSIS — F3342 Major depressive disorder, recurrent, in full remission: Secondary | ICD-10-CM | POA: Diagnosis not present

## 2023-04-08 DIAGNOSIS — Z1331 Encounter for screening for depression: Secondary | ICD-10-CM | POA: Diagnosis not present

## 2023-04-08 DIAGNOSIS — I700 Atherosclerosis of aorta: Secondary | ICD-10-CM | POA: Diagnosis not present

## 2023-04-08 DIAGNOSIS — D561 Beta thalassemia: Secondary | ICD-10-CM | POA: Diagnosis not present

## 2023-04-08 DIAGNOSIS — I10 Essential (primary) hypertension: Secondary | ICD-10-CM | POA: Diagnosis not present

## 2023-04-08 DIAGNOSIS — Z1339 Encounter for screening examination for other mental health and behavioral disorders: Secondary | ICD-10-CM | POA: Diagnosis not present

## 2023-04-22 DIAGNOSIS — M546 Pain in thoracic spine: Secondary | ICD-10-CM | POA: Diagnosis not present

## 2023-04-22 DIAGNOSIS — M62830 Muscle spasm of back: Secondary | ICD-10-CM | POA: Diagnosis not present

## 2023-04-22 DIAGNOSIS — M9903 Segmental and somatic dysfunction of lumbar region: Secondary | ICD-10-CM | POA: Diagnosis not present

## 2023-04-22 DIAGNOSIS — M9905 Segmental and somatic dysfunction of pelvic region: Secondary | ICD-10-CM | POA: Diagnosis not present

## 2023-04-22 DIAGNOSIS — M9902 Segmental and somatic dysfunction of thoracic region: Secondary | ICD-10-CM | POA: Diagnosis not present

## 2023-04-22 DIAGNOSIS — M9901 Segmental and somatic dysfunction of cervical region: Secondary | ICD-10-CM | POA: Diagnosis not present

## 2023-04-22 DIAGNOSIS — M542 Cervicalgia: Secondary | ICD-10-CM | POA: Diagnosis not present

## 2023-05-15 DIAGNOSIS — H52209 Unspecified astigmatism, unspecified eye: Secondary | ICD-10-CM | POA: Diagnosis not present

## 2023-05-15 DIAGNOSIS — H524 Presbyopia: Secondary | ICD-10-CM | POA: Diagnosis not present

## 2023-05-15 DIAGNOSIS — H5203 Hypermetropia, bilateral: Secondary | ICD-10-CM | POA: Diagnosis not present

## 2023-05-25 DIAGNOSIS — I2510 Atherosclerotic heart disease of native coronary artery without angina pectoris: Secondary | ICD-10-CM | POA: Diagnosis not present

## 2023-05-25 DIAGNOSIS — L03119 Cellulitis of unspecified part of limb: Secondary | ICD-10-CM | POA: Diagnosis not present

## 2023-05-25 DIAGNOSIS — I700 Atherosclerosis of aorta: Secondary | ICD-10-CM | POA: Diagnosis not present

## 2023-05-25 DIAGNOSIS — R52 Pain, unspecified: Secondary | ICD-10-CM | POA: Diagnosis not present

## 2023-05-25 DIAGNOSIS — I10 Essential (primary) hypertension: Secondary | ICD-10-CM | POA: Diagnosis not present

## 2023-05-25 DIAGNOSIS — Z299 Encounter for prophylactic measures, unspecified: Secondary | ICD-10-CM | POA: Diagnosis not present

## 2023-06-01 DIAGNOSIS — M9905 Segmental and somatic dysfunction of pelvic region: Secondary | ICD-10-CM | POA: Diagnosis not present

## 2023-06-01 DIAGNOSIS — M542 Cervicalgia: Secondary | ICD-10-CM | POA: Diagnosis not present

## 2023-06-01 DIAGNOSIS — M9902 Segmental and somatic dysfunction of thoracic region: Secondary | ICD-10-CM | POA: Diagnosis not present

## 2023-06-01 DIAGNOSIS — M9901 Segmental and somatic dysfunction of cervical region: Secondary | ICD-10-CM | POA: Diagnosis not present

## 2023-06-01 DIAGNOSIS — M62830 Muscle spasm of back: Secondary | ICD-10-CM | POA: Diagnosis not present

## 2023-06-01 DIAGNOSIS — M546 Pain in thoracic spine: Secondary | ICD-10-CM | POA: Diagnosis not present

## 2023-06-01 DIAGNOSIS — M9903 Segmental and somatic dysfunction of lumbar region: Secondary | ICD-10-CM | POA: Diagnosis not present

## 2023-06-12 ENCOUNTER — Inpatient Hospital Stay: Payer: Medicare HMO | Attending: Hematology

## 2023-06-12 DIAGNOSIS — E785 Hyperlipidemia, unspecified: Secondary | ICD-10-CM | POA: Diagnosis not present

## 2023-06-12 DIAGNOSIS — R7989 Other specified abnormal findings of blood chemistry: Secondary | ICD-10-CM | POA: Insufficient documentation

## 2023-06-12 DIAGNOSIS — Z7952 Long term (current) use of systemic steroids: Secondary | ICD-10-CM | POA: Diagnosis not present

## 2023-06-12 DIAGNOSIS — Z79899 Other long term (current) drug therapy: Secondary | ICD-10-CM | POA: Insufficient documentation

## 2023-06-12 DIAGNOSIS — I10 Essential (primary) hypertension: Secondary | ICD-10-CM | POA: Diagnosis not present

## 2023-06-12 DIAGNOSIS — Z7982 Long term (current) use of aspirin: Secondary | ICD-10-CM | POA: Insufficient documentation

## 2023-06-12 DIAGNOSIS — Z85118 Personal history of other malignant neoplasm of bronchus and lung: Secondary | ICD-10-CM | POA: Insufficient documentation

## 2023-06-12 DIAGNOSIS — D590 Drug-induced autoimmune hemolytic anemia: Secondary | ICD-10-CM

## 2023-06-12 DIAGNOSIS — D591 Autoimmune hemolytic anemia, unspecified: Secondary | ICD-10-CM | POA: Diagnosis not present

## 2023-06-12 LAB — LACTATE DEHYDROGENASE: LDH: 157 U/L (ref 98–192)

## 2023-06-12 LAB — RETICULOCYTES
Immature Retic Fract: 19.6 % — ABNORMAL HIGH (ref 2.3–15.9)
RBC.: 5.83 MIL/uL — ABNORMAL HIGH (ref 3.87–5.11)
Retic Count, Absolute: 136.4 10*3/uL (ref 19.0–186.0)
Retic Ct Pct: 2.3 % (ref 0.4–3.1)

## 2023-06-12 LAB — CBC
HCT: 33.5 % — ABNORMAL LOW (ref 36.0–46.0)
Hemoglobin: 11.6 g/dL — ABNORMAL LOW (ref 12.0–15.0)
MCH: 24.1 pg — ABNORMAL LOW (ref 26.0–34.0)
MCHC: 34.6 g/dL (ref 30.0–36.0)
MCV: 69.6 fL — ABNORMAL LOW (ref 80.0–100.0)
Platelets: 248 10*3/uL (ref 150–400)
RBC: 4.81 MIL/uL (ref 3.87–5.11)
RDW: 19.6 % — ABNORMAL HIGH (ref 11.5–15.5)
WBC: 8.5 10*3/uL (ref 4.0–10.5)
nRBC: 0.2 % (ref 0.0–0.2)

## 2023-06-12 LAB — HEPATIC FUNCTION PANEL
ALT: 18 U/L (ref 0–44)
AST: 28 U/L (ref 15–41)
Albumin: 4.5 g/dL (ref 3.5–5.0)
Alkaline Phosphatase: 54 U/L (ref 38–126)
Bilirubin, Direct: 0.2 mg/dL (ref 0.0–0.2)
Indirect Bilirubin: 1.6 mg/dL — ABNORMAL HIGH (ref 0.3–0.9)
Total Bilirubin: 1.8 mg/dL — ABNORMAL HIGH (ref 0.3–1.2)
Total Protein: 7.1 g/dL (ref 6.5–8.1)

## 2023-06-12 LAB — FERRITIN: Ferritin: 394 ng/mL — ABNORMAL HIGH (ref 11–307)

## 2023-06-12 LAB — DIRECT ANTIGLOBULIN TEST (NOT AT ARMC)
DAT, IgG: POSITIVE
DAT, complement: POSITIVE

## 2023-06-12 LAB — IRON AND TIBC
Iron: 58 ug/dL (ref 28–170)
Saturation Ratios: 23 % (ref 10.4–31.8)
TIBC: 247 ug/dL — ABNORMAL LOW (ref 250–450)
UIBC: 189 ug/dL

## 2023-06-25 ENCOUNTER — Inpatient Hospital Stay: Payer: Medicare HMO | Attending: Hematology | Admitting: Hematology

## 2023-06-25 VITALS — BP 145/52 | HR 66 | Temp 97.9°F | Resp 18 | Wt 128.8 lb

## 2023-06-25 DIAGNOSIS — D590 Drug-induced autoimmune hemolytic anemia: Secondary | ICD-10-CM

## 2023-06-25 DIAGNOSIS — Z7952 Long term (current) use of systemic steroids: Secondary | ICD-10-CM | POA: Diagnosis not present

## 2023-06-25 DIAGNOSIS — D591 Autoimmune hemolytic anemia, unspecified: Secondary | ICD-10-CM | POA: Insufficient documentation

## 2023-06-25 DIAGNOSIS — R7989 Other specified abnormal findings of blood chemistry: Secondary | ICD-10-CM | POA: Insufficient documentation

## 2023-06-25 NOTE — Progress Notes (Signed)
[facility] 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 - Status post rituximab weekly x4 completed on 11/28/2021.  She had relapse of hemolysis when prednisone was discontinued.  Currently on prednisone 5 mg daily treated by Dr Youlanda Mighty at [place].  She is switching care due to proximity. - She also has hereditary beta thalassemia trait (11/13/2020 hemoglobin electrophoresis: HbA-91.8%, HbA2-4.9%, HbF-3.3%) and reports her normal hemoglobin is between 10 and 11.    Social/family history: - She lives by herself at home.  She retired from office job.  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  Her half-sister is [place] who is our patient.  She also worked on at tobacco farm.  She has exposure to [employer] and other fertilizers.  She was never smoker. - Half sister has breast cancer.  Does not know her paternal side of the family.  3.  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 - Reviewed CT chest without contrast from 06/10/2022 which showed no evidence of recurrence or metastatic disease. - No need of further scans.  Plan: Autoimmune hemolytic anemia: - She does not report any B symptoms or infections in the last 6 months. - Labs from 06/12/2023: Total bilirubin 1.8, indirect bilirubin 1.6.  LDH normal at 157.  Hemoglobin is stable at 11.6.  She has microcytosis, likely from underlying thalassemia trait. - Reticulocyte count is 2.3%.  Direct Coombs test is positive for complement and IgG. -  Continue prednisone 5 mg daily and folic acid daily. - RTC 6 months with repeat labs.  2.  Elevated ferritin: - Ferritin today is 394, percent saturation 23.  Stable.    Orders Placed This Encounter  Procedures   CBC    Standing Status:   Future    Standing Expiration Date:   06/24/2024   Iron and TIBC ([facility] ONLY)    Standing Status:   Future    Standing Expiration Date:   06/24/2024   Ferritin    Standing Status:   Future    Standing Expiration Date:   06/24/2024   Reticulocytes    Standing Status:   Future    Standing Expiration Date:   06/24/2024   Hepatic function panel    Standing Status:   Future    Standing Expiration Date:   06/24/2024   Lactate dehydrogenase    Standing Status:   Future    Standing Expiration Date:   06/24/2024   Direct antiglobulin test    Standing Status:   Future    Standing Expiration Date:   06/24/2024      Dawn Lin,acting as a scribe for Dawn Massed, MD.,have documented all relevant documentation on the behalf of Dawn Massed, MD,as directed by  Dawn Massed, MD while in the presence of Dawn Massed, MD.  I, Dawn Massed MD, have reviewed the above documentation for accuracy and completeness, and I agree with the above.    Dawn Massed, MD   9/12/20245:24 PM  CHIEF COMPLAINT:   Diagnosis: autoimmune hemolytic anemia  Cancer Staging  Malignant neoplasm of right upper lobe of lung [facility]) Staging form: Lung, AJCC 8th Edition - Pathologic stage from 04/21/2017: Stage IA2 (pT1b, pN0, cM0) - Signed by Delight Ovens, MD on 04/22/2017    Prior Therapy: none  Current Therapy:  surveillance   INTERVAL HISTORY:   Dawn Lin is a 82 y.o. female presenting to clinic today for follow up of autoimmune hemolytic anemia. She was last seen by me on 12/23/22.  Today, she states that she is doing well overall. Her appetite level is at 100%. Her energy level is at 50%.  She  is taking Prednisone and folic acid as prescribed. She denies any recent infections or issues with taking steroids.    PAST MEDICAL HISTORY:   Past Medical History: Past Medical History:  Diagnosis Date   Anemia    Anxiety    Dyspnea    with exertion   Essential hypertension, benign since the 1900's   Family history of adverse reaction to anesthesia    Children - N/V   History of blood transfusion    Hyperlipidemia    Other osteoporosis    Other thalassemia (HCC)     Surgical History: Past Surgical History:  Procedure Laterality Date   BIOPSY  12/28/2020   Procedure: BIOPSY;  Surgeon: Dolores Frame, MD;  [facility];  Service: Gastroenterology;;   COLONOSCOPY     COLONOSCOPY WITH PROPOFOL N/A 12/28/2020   Procedure: COLONOSCOPY WITH PROPOFOL;  Surgeon: Dolores Frame, MD;  [facility];  Service: Gastroenterology;  Laterality: N/A;  AM   ESOPHAGOGASTRODUODENOSCOPY (EGD) WITH PROPOFOL N/A 12/28/2020   Procedure: ESOPHAGOGASTRODUODENOSCOPY (EGD) WITH PROPOFOL;  Surgeon: Dolores Frame, MD;  [facility];  Service: Gastroenterology;  Laterality: N/A;   INGUINAL HERNIA REPAIR Left    LUMBAR DISC SURGERY  1990's   for severe pain and sciatica   PARTIAL HYSTERECTOMY  1970's   for bleeding and prolapse   VIDEO ASSISTED THORACOSCOPY (VATS)/ LOBECTOMY Right 04/20/2017   Procedure: VIDEO ASSISTED THORACOSCOPY (VATS)/RIGHT UPPER LOBECTOMY;  Surgeon: Delight Ovens, MD;  Location: [facility] OR;  Service: Thoracic;  Laterality: Right;   VIDEO BRONCHOSCOPY N/A 04/20/2017   Procedure: VIDEO BRONCHOSCOPY;  Surgeon: Delight Ovens, MD;  Location: [facility] OR;  Service: Thoracic;  Laterality: N/A;    Social History: Social History   Socioeconomic History   Marital status: Widowed    Spouse name: Not on file   Number of children: Not on file   Years of education: Not on file   Highest education level: Not on file  Occupational  History   Occupation: retired  Tobacco Use   Smoking status: Never   Smokeless tobacco: Never  Vaping Use   Vaping status: Never Used  Substance and Sexual Activity   Alcohol use: No   Drug use: No   Sexual activity: Never  Other Topics Concern   Not on file  Social History Narrative   Mrs. Wisecarver is married. Her husband has been chronically ill for 6 years (2011). He has metastatic prostate cancer and multiple myeloma. 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 She has a lot of family support.    Social Determinants of Health   Financial Resource Strain: Low Risk  (11/27/2021)   Received from [facility], [facility]   Overall Financial Resource Strain (CARDIA)    Difficulty of Paying Living Expenses: Not hard at all  Food Insecurity:  No Food Insecurity (11/27/2021)   Received from [facility], [facility]   Hunger Vital Sign    Worried About Running Out of Food in the Last Year: Never true    Ran Out of Food in the Last Year: Never true  Transportation Needs: No Transportation Needs (11/27/2021)   Received from [facility], [facility]   [facility] - Transportation    Lack of Transportation (Medical): No    Lack of Transportation (Non-Medical): No  Physical Activity: Inactive (11/27/2021)   Received from [facility], [facility]   Exercise Vital Sign    Days of Exercise per Week: 0 days    Minutes of Exercise per Session: 0 min  Stress: No Stress Concern Present (11/27/2021)   Received from [facility], [facility] - Occupational Stress Questionnaire    Feeling of Stress : Not at all  Social Connections: Moderately Isolated (11/27/2021)   Received from [facility], [facility]   Social Connection and Isolation Panel [NHANES]    Frequency of Communication with Friends and Family: More than three times a week    Frequency of Social Gatherings with Friends and Family: More than  three times a week    Attends Religious Services: More than 4 times per year    Active Member of [employer] or Organizations: No    Attends Banker Meetings: Never    Marital Status: Widowed  Intimate Partner Violence: Not At Risk (11/27/2021)   Received from [facility], [facility]   Humiliation, Afraid, Rape, and Kick questionnaire    Fear of Current or Ex-Partner: No    Emotionally Abused: No    Physically Abused: No    Sexually Abused: No    Family History: Family History  Problem Relation Age of Onset   Hypertension Mother    Asthma Sister    Heart attack Sister 20    Current Medications:  Current Outpatient Medications:    acetaminophen (TYLENOL) 500 MG tablet, Take 1 tablet (500 mg total) by mouth every 6 (six) hours as needed., Disp: 30 tablet, Rfl: 0   Alpha-D-Galactosidase (BEANO PO), Take 1 capsule by mouth daily as needed (bloating)., Disp: , Rfl:    amLODipine (NORVASC) 10 MG tablet, Take 10 mg by mouth daily., Disp: , Rfl:    Ascorbic Acid (VITAMIN C) 500 MG CHEW, Chew 500 mg by mouth daily., Disp: , Rfl:    aspirin EC 81 MG tablet, Take 81 mg by mouth daily., Disp: , Rfl:    benzonatate (TESSALON) 100 MG capsule, Take 1 capsule (100 mg total) by mouth 3 (three) times daily as needed for cough., Disp: 30 capsule, Rfl: 0   Calcium-Phosphorus-Vitamin D 250-115-250 MG-MG-UNIT CHEW, Chew 1 each by mouth 2 (two) times daily. , Disp: , Rfl:    Cyanocobalamin (VITAMIN B12 SL), Place under the tongue., Disp: , Rfl:    fexofenadine (ALLEGRA) 180 MG tablet, Take 180 mg by mouth daily as needed for allergies., Disp: , Rfl:    folic acid (FOLVITE) 1 MG tablet, Take 2 mg by mouth daily., Disp: , Rfl:    guaiFENesin (MUCINEX) 600 MG 12 hr tablet, Take 600 mg by mouth 2 (two) times daily as needed for cough or to loosen phlegm., Disp: , Rfl:    ibuprofen (ADVIL) 200 MG tablet, Take 200 mg by mouth every 6 (six) hours as needed for moderate pain.,  Disp: , Rfl:     ipratropium (ATROVENT) 0.03 % nasal spray, PLACE 2 SPRAYS INTO BOTH NOSTRILS 3 (THREE) TIMES DAILY, Disp: 90 mL, Rfl: 1   meclizine (ANTIVERT) 12.5 MG tablet, Take 12.5 mg by mouth every 6 (six) hours as needed for dizziness., Disp: , Rfl:    Multiple Vitamin (MULTIVITAMIN WITH MINERALS) TABS tablet, Take 1 tablet by mouth daily., Disp: , Rfl:    Multiple Vitamins-Minerals (ZINC PO), Take by mouth., Disp: , Rfl:    potassium chloride SA (KLOR-CON M) 20 MEQ tablet, Take 1 tablet (20 mEq total) by mouth as needed (when taking the lasix for swelling)., Disp: 90 tablet, Rfl: 3   predniSONE (DELTASONE) 5 MG tablet, Take 5 mg by mouth daily with breakfast., Disp: , Rfl:    rosuvastatin (CRESTOR) 5 MG tablet, TAKE ONE TABLET BY MOUTH EVERY MORNING, Disp: 90 tablet, Rfl: 2   simethicone (MYLICON) 80 MG chewable tablet, Chew 80 mg by mouth every 6 (six) hours as needed for flatulence., Disp: , Rfl:    furosemide (LASIX) 20 MG tablet, TAKE 1 TABLET (20 MG TOTAL) BY MOUTH AS NEEDED (FOR SWELLING)., Disp: 90 tablet, Rfl: 1   Allergies: Allergies  Allergen Reactions   Lisinopril Cough    REVIEW OF SYSTEMS:   Review of Systems  Constitutional:  Negative for chills, fatigue and fever.  HENT:   Negative for lump/mass, mouth sores, nosebleeds, sore throat and trouble swallowing.   Eyes:  Negative for eye problems.  Respiratory:  Positive for cough and shortness of breath (with exertion).   Cardiovascular:  Negative for chest pain, leg swelling and palpitations.  Gastrointestinal:  Negative for abdominal pain, constipation, diarrhea, nausea and vomiting.  Genitourinary:  Negative for bladder incontinence, difficulty urinating, dysuria, frequency, hematuria and nocturia.   Musculoskeletal:  Negative for arthralgias, back pain, flank pain, myalgias and neck pain.  Skin:  Negative for itching and rash.  Neurological:  Positive for numbness (left leg). Negative for dizziness and headaches.  Hematological:   Does not bruise/bleed easily.  Psychiatric/Behavioral:  Positive for sleep disturbance. Negative for depression and suicidal ideas. The patient is not nervous/anxious.   All other systems reviewed and are negative.    VITALS:   Blood pressure (!) 145/52, pulse 66, temperature 97.9 F (36.6 C), temperature source Oral, resp. rate 18, weight 128 lb 12.8 oz (58.4 kg), SpO2 97%.  Wt Readings from Last 3 Encounters:  06/25/23 128 lb 12.8 oz (58.4 kg)  12/23/22 125 lb (56.7 kg)  12/19/22 124 lb 9.6 oz (56.5 kg)    Body mass index is 26.01 kg/m.  PHYSICAL EXAM:   Physical Exam Vitals and nursing note reviewed. Exam conducted with a chaperone present.  Constitutional:      Appearance: Normal appearance.  Cardiovascular:     Rate and Rhythm: Normal rate and regular rhythm.     Pulses: Normal pulses.     Heart sounds: Murmur (ejection systolic) heard.  Pulmonary:     Effort: Pulmonary effort is normal.     Breath sounds: Normal breath sounds.  Abdominal:     Palpations: Abdomen is soft. There is no hepatomegaly, splenomegaly or mass.     Tenderness: There is no abdominal tenderness.  Musculoskeletal:     Right lower leg: No edema.     Left lower leg: No edema.  Lymphadenopathy:     Cervical: No cervical adenopathy.     Right cervical: No superficial, deep or posterior cervical adenopathy.    Left  cervical: No superficial, deep or posterior cervical adenopathy.     Upper Body:     Right upper body: No supraclavicular or axillary adenopathy.     Left upper body: No supraclavicular or axillary adenopathy.  Neurological:     General: No focal deficit present.     Mental Status: She is alert and oriented to person, place, and time.  Psychiatric:        Mood and Affect: Mood normal.        Behavior: Behavior normal.     LABS:      Latest Ref Rng & Units 06/12/2023   12:34 PM 12/16/2022   11:14 AM 06/10/2022    9:56 AM  CBC  WBC 4.0 - 10.5 K/uL 8.5  10.5  9.2   Hemoglobin 12.0  - 15.0 g/dL 62.1  30.8  65.7   Hematocrit 36.0 - 46.0 % 33.5  33.0  34.3   Platelets 150 - 400 K/uL 248  282  303       Latest Ref Rng & Units 06/12/2023   12:34 PM 12/16/2022   11:14 AM 06/10/2022    9:56 AM  CMP  Glucose 70 - 99 mg/dL   89   BUN 8 - 23 mg/dL   17   Creatinine 8.46 - 1.00 mg/dL   9.62   Sodium 952 - 841 mmol/L   139   Potassium 3.5 - 5.1 mmol/L   3.1   Chloride 98 - 111 mmol/L   105   CO2 22 - 32 mmol/L   27   Calcium 8.9 - 10.3 mg/dL   9.2   Total Protein 6.5 - 8.1 g/dL 7.1  6.8  6.5   Total Bilirubin 0.3 - 1.2 mg/dL 1.8  1.9  2.1   Alkaline Phos 38 - 126 U/L 54  59  51   AST 15 - 41 U/L 28  28  25    ALT 0 - 44 U/L 18  14  13       No results found for: "CEA1", "CEA" / No results found for: "CEA1", "CEA" No results found for: "PSA1" No results found for: "LKG401" No results found for: "CAN125"  No results found for: "TOTALPROTELP", "ALBUMINELP", "A1GS", "A2GS", "BETS", "BETA2SER", "GAMS", "MSPIKE", "SPEI" Lab Results  Component Value Date   TIBC 247 (L) 06/12/2023   TIBC 250 12/16/2022   TIBC 231 (L) 01/24/2022   FERRITIN 394 (H) 06/12/2023   FERRITIN 386 (H) 12/16/2022   FERRITIN 569 (H) 01/24/2022   IRONPCTSAT 23 06/12/2023   IRONPCTSAT 32 (H) 12/16/2022   IRONPCTSAT 56 (H) 01/24/2022   Lab Results  Component Value Date   LDH 157 06/12/2023   LDH 174 12/16/2022   LDH 188 06/10/2022     STUDIES:   No results found.

## 2023-06-26 ENCOUNTER — Ambulatory Visit: Payer: Medicare HMO | Attending: Cardiology | Admitting: Cardiology

## 2023-06-26 ENCOUNTER — Encounter: Payer: Self-pay | Admitting: Cardiology

## 2023-06-26 VITALS — BP 140/68 | HR 79 | Ht 59.0 in | Wt 127.0 lb

## 2023-06-26 DIAGNOSIS — I350 Nonrheumatic aortic (valve) stenosis: Secondary | ICD-10-CM | POA: Diagnosis not present

## 2023-06-26 DIAGNOSIS — E782 Mixed hyperlipidemia: Secondary | ICD-10-CM

## 2023-06-26 DIAGNOSIS — I2510 Atherosclerotic heart disease of native coronary artery without angina pectoris: Secondary | ICD-10-CM

## 2023-06-26 DIAGNOSIS — R0602 Shortness of breath: Secondary | ICD-10-CM | POA: Diagnosis not present

## 2023-06-26 MED ORDER — LOSARTAN POTASSIUM 25 MG PO TABS
12.5000 mg | ORAL_TABLET | Freq: Every day | ORAL | 3 refills | Status: DC
Start: 2023-06-26 — End: 2023-11-06

## 2023-06-26 NOTE — Progress Notes (Signed)
Clinical Summary Ms. Ameri is a 82 y.o.female seen today for follow up of the following medical problems.      1. CAD - noted on prior CT scsan - 07/2017 nuclear stress: no clear ischemia.  07/2017 echo LVEF 60-65%, grade I diastolic dysfunction    - no chest pains,    2. HTN -  issues with low K on HCTZ, frequent urination - compliant with meds  - home bp's 140s-150s/50s-60s.    3. Hyperlipidemia - she is on on crestor  07/2020 LDL 84 HDL 67 TG 140 TC 175 - 07/2022 TC 198 TG 193 HDL 78 LDL 88       4. Lung adenocarcinoma - s/p resection in 2018   5. SOB - 03/2017 PFTs: minimal perfusion defect      6. Aortic stenosis 07/2017 echo  mean grad 16, AVA VTI 1.54 would suggest mild AS.  - no recent symptoms.    02/2021 echo LVEF 60-65%, no WMAs, mild to mod AS mean grad 15, AVA VTI, 1.15, DI 0.33    01/2023 echo: LVEF 60-65%, no WMAs, grade I dd, normal RV function, mod to severe AS mean grad 33, AVA VTI 0.98 DI 0.31 SVI 56. Mod AI  some SOB at times. Typically with walking up inclines or stairs. Fatigue with sweeping or vaccuuming.    7. Vision change - episode of vision change, lightheadedness while driving - seen at [facility] ER.  - extensive workup in ER was unremarkable. 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 Followed by heme/onc       SH: Sister is [place] who is also coming to establish in our clinic  Past Medical History:  Diagnosis Date   Anemia    Anxiety    Dyspnea    with exertion   Essential hypertension, benign since the 1900's   Family history of adverse reaction to anesthesia    Children - N/V   History of blood transfusion    Hyperlipidemia    Other osteoporosis    Other thalassemia (HCC)      Allergies  Allergen Reactions   Lisinopril Cough     Current Outpatient Medications  Medication Sig Dispense Refill   acetaminophen (TYLENOL) 500 MG tablet Take 1 tablet (500  mg total) by mouth every 6 (six) hours as needed. 30 tablet 0   Alpha-D-Galactosidase (BEANO PO) Take 1 capsule by mouth daily as needed (bloating).     amLODipine (NORVASC) 10 MG tablet Take 10 mg by mouth daily.     Ascorbic Acid (VITAMIN C) 500 MG CHEW Chew 500 mg by mouth daily.     aspirin EC 81 MG tablet Take 81 mg by mouth daily.     benzonatate (TESSALON) 100 MG capsule Take 1 capsule (100 mg total) by mouth 3 (three) times daily as needed for cough. 30 capsule 0   Calcium-Phosphorus-Vitamin D 250-115-250 MG-MG-UNIT CHEW Chew 1 each by mouth 2 (two) times daily.      Cyanocobalamin (VITAMIN B12 SL) Place under the tongue.     fexofenadine (ALLEGRA) 180 MG tablet Take 180 mg by mouth daily as needed for allergies.     folic acid (FOLVITE) 1 MG tablet Take 2 mg by mouth daily.     furosemide (LASIX) 20 MG tablet TAKE 1 TABLET (20 MG TOTAL) BY MOUTH AS NEEDED (FOR SWELLING). 90 tablet 1   guaiFENesin (MUCINEX) 600 MG 12 hr tablet Take 600  mg by mouth 2 (two) times daily as needed for cough or to loosen phlegm.     ibuprofen (ADVIL) 200 MG tablet Take 200 mg by mouth every 6 (six) hours as needed for moderate pain.     ipratropium (ATROVENT) 0.03 % nasal spray PLACE 2 SPRAYS INTO BOTH NOSTRILS 3 (THREE) TIMES DAILY 90 mL 1   meclizine (ANTIVERT) 12.5 MG tablet Take 12.5 mg by mouth every 6 (six) hours as needed for dizziness.     Multiple Vitamin (MULTIVITAMIN WITH MINERALS) TABS tablet Take 1 tablet by mouth daily.     Multiple Vitamins-Minerals (ZINC PO) Take by mouth.     potassium chloride SA (KLOR-CON M) 20 MEQ tablet Take 1 tablet (20 mEq total) by mouth as needed (when taking the lasix for swelling). 90 tablet 3   predniSONE (DELTASONE) 5 MG tablet Take 5 mg by mouth daily with breakfast.     rosuvastatin (CRESTOR) 5 MG tablet TAKE ONE TABLET BY MOUTH EVERY MORNING 90 tablet 2   simethicone (MYLICON) 80 MG chewable tablet Chew 80 mg by mouth every 6 (six) hours as needed for  flatulence.     No current facility-administered medications for this visit.     Past Surgical History:  Procedure Laterality Date   BIOPSY  12/28/2020   Procedure: BIOPSY;  Surgeon: Dolores Frame, MD;  Location: [facility];  Service: Gastroenterology;;   COLONOSCOPY     COLONOSCOPY WITH PROPOFOL N/A 12/28/2020   Procedure: COLONOSCOPY WITH PROPOFOL;  Surgeon: Dolores Frame, MD;  Location: [facility];  Service: Gastroenterology;  Laterality: N/A;  AM   ESOPHAGOGASTRODUODENOSCOPY (EGD) WITH PROPOFOL N/A 12/28/2020   Procedure: ESOPHAGOGASTRODUODENOSCOPY (EGD) WITH PROPOFOL;  Surgeon: Dolores Frame, MD;  Location: [facility];  Service: Gastroenterology;  Laterality: N/A;   INGUINAL HERNIA REPAIR Left    LUMBAR DISC SURGERY  1990's   for severe pain and sciatica   PARTIAL HYSTERECTOMY  1970's   for bleeding and prolapse   VIDEO ASSISTED THORACOSCOPY (VATS)/ LOBECTOMY Right 04/20/2017   Procedure: VIDEO ASSISTED THORACOSCOPY (VATS)/RIGHT UPPER LOBECTOMY;  Surgeon: Delight Ovens, MD;  [facility] OR;  Service: Thoracic;  Laterality: Right;   VIDEO BRONCHOSCOPY N/A 04/20/2017   Procedure: VIDEO BRONCHOSCOPY;  Surgeon: Delight Ovens, MD;  Location: [facility] OR;  Service: Thoracic;  Laterality: N/A;     Allergies  Allergen Reactions   Lisinopril Cough      Family History  Problem Relation Age of Onset   Hypertension Mother    Asthma Sister    Heart attack Sister 38     Social History Ms. Allaway reports that she has never smoked. She has never used smokeless tobacco. Ms. Fest reports no history of alcohol use.   Review of Systems CONSTITUTIONAL: No weight loss, fever, chills, weakness or fatigue.  HEENT: Eyes: No visual loss, blurred vision, double vision or yellow sclerae.No hearing loss, sneezing, congestion, runny nose or sore throat.  SKIN: No rash or itching.  CARDIOVASCULAR: per hpi RESPIRATORY: per hpi GASTROINTESTINAL:  No anorexia, nausea, vomiting or diarrhea. No abdominal pain or blood.  GENITOURINARY: No burning on urination, no polyuria NEUROLOGICAL: No headache, dizziness, syncope, paralysis, ataxia, numbness or tingling in the extremities. No change in bowel or bladder control.  MUSCULOSKELETAL: No muscle, back pain, joint pain or stiffness.  LYMPHATICS: No enlarged nodes. No history of splenectomy.  PSYCHIATRIC: No history of depression or anxiety.  ENDOCRINOLOGIC: No reports of sweating, cold or heat intolerance.  No polyuria or polydipsia.  Marland Kitchen   Physical Examination Today's Vitals   06/26/23 1454  BP: (!) 140/68  Pulse: 79  SpO2: 98%  Weight: 127 lb (57.6 kg)  Height: 4\' 11"  (1.499 m)   Body mass index is 25.65 kg/m.  Gen: resting comfortably, no acute distress HEENT: no scleral icterus, pupils equal round and reactive, no palptable cervical adenopathy,  CV: RRR, 3/6 systolc murmur rusb, no jvd Resp: Clear to auscultation bilaterally GI: abdomen is soft, non-tender, non-distended, normal bowel sounds, no hepatosplenomegaly MSK: extremities are warm, no edema.  Skin: warm, no rash Neuro:  no focal deficits Psych: appropriate affect   Diagnostic Studies  07/2018 nuclear stress Nuclear stress EF: 80%. There was no ST segment deviation noted during stress. There is a large defect of moderate severity present in the basal anteroseptal, basal inferoseptal, mid anteroseptal, mid inferoseptal, apical septal and apex location. The defect is non-reversible. In the setting of normal LVF, this is likely due to breast attenuation artifact. No ischemia noted. This is a low risk study. The left ventricular ejection fraction is hyperdynamic (>65%).     07/2017 echo Study Conclusions   - Left ventricle: The cavity size was normal. Wall thickness was   normal. Systolic function was normal. The estimated ejection   fraction was in the range of 60% to 65%. Wall motion was normal;   there were no  regional wall motion abnormalities. Doppler   parameters are consistent with abnormal left ventricular   relaxation (grade 1 diastolic dysfunction). - Aortic valve: There was trivial regurgitation.     02/2021 echo  1. Left ventricular ejection fraction, by estimation, is 60 to 65%. The  left ventricle has normal function. Left ventricular endocardial border  not optimally defined to evaluate regional wall motion. Left ventricular  diastolic parameters are  indeterminate. Elevated left atrial pressure.   2. Right ventricular systolic function is normal. The right ventricular  size is normal.   3. The mitral valve is normal in structure. No evidence of mitral valve  regurgitation. No evidence of mitral stenosis.   4. The aortic valve is tricuspid. There is mild calcification of the  aortic valve. There is mild thickening of the aortic valve. Aortic valve  regurgitation is mild. Mild to moderate aortic valve stenosis .Aortic  valve mean gradient measures 15.0 mmHg.  Aortic valve peak gradient measures 33.7 mmHg. Aortic valve area, by VTI  measures 1.15 cm.   5. The inferior vena cava is normal in size with greater than 50%  respiratory variability, suggesting right atrial pressure of 3 mmHg.    01/2023 echo IMPRESSIONS     1. Left ventricular ejection fraction, by estimation, is 60 to 65%. The  left ventricle has normal function. The left ventricle has no regional  wall motion abnormalities. Left ventricular diastolic parameters are  consistent with Grade I diastolic  dysfunction (impaired relaxation).   2. Right ventricular systolic function is normal. The right ventricular  size is normal.   3. The mitral valve is normal in structure. Trivial mitral valve  regurgitation. No evidence of mitral stenosis.   4. The aortic valve is tricuspid. There is severe calcifcation of the  aortic valve. Aortic valve regurgitation is moderate. Moderate to severe  aortic valve stenosis. Aortic  valve area, by VTI measures 0.98 cm. Aortic  valve mean gradient measures 33.0  mmHg. Aortic valve Vmax measures 3.81 m/s.   5. The inferior vena cava is normal in size with greater  than 50%  respiratory variability, suggesting right atrial pressure of 3 mmHg.   01/2023 carotid US IMPRESSION: Color duplex indicates minimal heterogeneous and calcified plaque, with no hemodynamically significant stenosis by duplex criteria in the extracranial cerebrovascular circulation.    Assessment and Plan   1. CAD - noted by CT scan, nuclear stress test shows no significnat ischemia - no recent chest pains, continue to monitor - EKG today shows NSR, no ischemic changes   2. Hyperlipidemia - LDL at goal, continue current meds - upcomign labs with pcp   3. Aortic stenosis - 01/2023 mod to severe AS nearing severe end of spectrum - since that time some new SOB/DOE, given clinical change we will go ahead and recheck echo.      4. HTN - recurrent issues with low K, frequent urination on HCTZ. HCTZ was stopped - high bp today, start losartan 12.5mg  daily. She has labs with pcp already in 2 weeks.  - drop off bp log when she comes for echo   F/u 3 months     Antoine Poche, M.D.

## 2023-06-26 NOTE — Patient Instructions (Signed)
Medication Instructions:  Your physician has recommended you make the following change in your medication:   -Start Losartan 12.5 mg tablet once daily.   *If you need a refill on your cardiac medications before your next appointment, please call your pharmacy*   Lab Work: None If you have labs (blood work) drawn today and your tests are completely normal, you will receive your results only by: MyChart Message (if you have MyChart) OR A paper copy in the mail If you have any lab test that is abnormal or we need to change your treatment, we will call you to review the results.   Testing/Procedures: Your physician has requested that you have an echocardiogram. Echocardiography is a painless test that uses sound waves to create images of your heart. It provides your doctor with information about the size and shape of your heart and how well your heart's chambers and valves are working. This procedure takes approximately one hour. There are no restrictions for this procedure. Please do NOT wear cologne, perfume, aftershave, or lotions (deodorant is allowed). Please arrive 15 minutes prior to your appointment time.    Follow-Up: At [facility], you and your health needs are our priority.  As part of our continuing mission to provide you with exceptional heart care, we have created designated Provider Care Teams.  These Care Teams include your primary Cardiologist (physician) and Advanced Practice Providers (APPs -  Physician Assistants and Nurse Practitioners) who all work together to provide you with the care you need, when you need it.  We recommend signing up for the patient portal called "MyChart".  Sign up information is provided on this After Visit Summary.  MyChart is used to connect with patients for Virtual Visits (Telemedicine).  Patients are able to view lab/test results, encounter notes, upcoming appointments, etc.  Non-urgent messages can be sent to your provider as well.    To learn more about what you can do with MyChart, go to [email or web address].    Your next appointment:   3 month(s)  Provider:   You may see Dina Rich, MD or one of the following Advanced Practice Providers on your designated Care Team:   Randall An, PA-C  Jacolyn Reedy, [place]     Other Instructions Please bring your blood pressure log with you to your Echo appointment.

## 2023-07-06 ENCOUNTER — Other Ambulatory Visit: Payer: Self-pay

## 2023-07-06 ENCOUNTER — Ambulatory Visit: Payer: Medicare HMO | Admitting: Internal Medicine

## 2023-07-06 ENCOUNTER — Encounter: Payer: Self-pay | Admitting: Internal Medicine

## 2023-07-06 VITALS — BP 160/68 | HR 68 | Temp 98.0°F | Resp 16 | Ht 58.86 in | Wt 129.1 lb

## 2023-07-06 DIAGNOSIS — J3089 Other allergic rhinitis: Secondary | ICD-10-CM | POA: Diagnosis not present

## 2023-07-06 MED ORDER — IPRATROPIUM BROMIDE 0.03 % NA SOLN: 1.0000 | Freq: Three times a day (TID) | NASAL | 3 refills | Status: DC | PRN

## 2023-07-06 MED ORDER — FEXOFENADINE HCL 180 MG PO TABS: 180.0000 mg | ORAL_TABLET | Freq: Every day | ORAL | 3 refills | Status: DC | PRN

## 2023-07-06 NOTE — Progress Notes (Signed)
FOLLOW UP Date of Service/Encounter:  07/06/23   Subjective:  Dawn Lin (DOB: 12-19-1940) is a 82 y.o. female who [facility] for follow up for allergic rhinitis.   History obtained from: chart review and patient. Last visit was on 05/28/2022 with Dr. Dellis Anes. Allergic to dust mites. On Allegra and Atrovent nose spray.   Doing well overall.  Does sometimes have cough with drainage, nasal stuffiness.  Uses Allegra daily and Atrovent daily; if worse, she increases the Atrovent and sometimes adds Mucinex.  Some months she does really well and can come off the medications.   She does have aortic stenosis and is followed by Cardiology; planning to do Echo soon.  Also with autoimmune hemolytic anemia, s/p Rituxan completed 2023 and on chronic prednisone.  Past Medical History: Past Medical History:  Diagnosis Date   Anemia    Anxiety    Dyspnea    with exertion   Essential hypertension, benign since the 1900's   Family history of adverse reaction to anesthesia    Children - N/V   History of blood transfusion    Hyperlipidemia    Other osteoporosis    Other thalassemia (HCC)     Objective:  BP (!) 160/68   Pulse 68   Temp 98 F (36.7 C)   Resp 16   Ht 4' 10.86" (1.495 m)   Wt 129 lb 2 oz (58.6 kg)   LMP  (LMP Unknown)   SpO2 96%   BMI 26.21 kg/m  Body mass index is 26.21 kg/m. Physical Exam: GEN: alert, well developed HEENT: clear conjunctiva, nose without inferior turbinate hypertrophy, pink nasal mucosa, + clear rhinorrhea, + slight cobblestoning HEART: regular rate and rhythm, + murmur LUNGS: clear to auscultation bilaterally, no coughing, unlabored respiration SKIN: no rashes or lesions  Assessment:   1. Perennial allergic rhinitis     Plan/Recommendations:  Allergic Rhinitis - Well controlled.  - SPT 02/2022: positive to dust mites - Continue with: Allegra (fexofenadine) 180mg  tablet once  daily - Continue with: Atrovent (ipratropium) 0.03% one spray per nostril 2-3 times daily as needed for runny nose/drainage (CAN BE OVER DRYING) - You can use an extra dose of the antihistamine, if needed, for breakthrough symptoms.  - Consider nasal saline rinses 1-2 times daily to remove allergens from the nasal cavities as well as help with mucous clearance (this is especially helpful to do before the nasal sprays are given)     Return in about 1 year (around 07/05/2024).  Alesia Morin, MD [facility]

## 2023-07-06 NOTE — Patient Instructions (Addendum)
1. Chronic rhinitis - SPT 02/2022: positive to dust mites - Continue with: Allegra (fexofenadine) 180mg  tablet once daily - Continue with: Atrovent (ipratropium) 0.03% one spray per nostril 2-3 times daily as needed for runny nose/drainage (CAN BE OVER DRYING) - You can use an extra dose of the antihistamine, if needed, for breakthrough symptoms.  - Consider nasal saline rinses 1-2 times daily to remove allergens from the nasal cavities as well as help with mucous clearance (this is especially helpful to do before the nasal sprays are given)  2. Return in about 1 year (around 07/05/2024).

## 2023-07-07 DIAGNOSIS — E7800 Pure hypercholesterolemia, unspecified: Secondary | ICD-10-CM | POA: Diagnosis not present

## 2023-07-07 DIAGNOSIS — R5383 Other fatigue: Secondary | ICD-10-CM | POA: Diagnosis not present

## 2023-07-07 DIAGNOSIS — Z79899 Other long term (current) drug therapy: Secondary | ICD-10-CM | POA: Diagnosis not present

## 2023-07-08 DIAGNOSIS — M546 Pain in thoracic spine: Secondary | ICD-10-CM | POA: Diagnosis not present

## 2023-07-08 DIAGNOSIS — M9901 Segmental and somatic dysfunction of cervical region: Secondary | ICD-10-CM | POA: Diagnosis not present

## 2023-07-08 DIAGNOSIS — M62830 Muscle spasm of back: Secondary | ICD-10-CM | POA: Diagnosis not present

## 2023-07-08 DIAGNOSIS — M9905 Segmental and somatic dysfunction of pelvic region: Secondary | ICD-10-CM | POA: Diagnosis not present

## 2023-07-08 DIAGNOSIS — M9902 Segmental and somatic dysfunction of thoracic region: Secondary | ICD-10-CM | POA: Diagnosis not present

## 2023-07-08 DIAGNOSIS — M9903 Segmental and somatic dysfunction of lumbar region: Secondary | ICD-10-CM | POA: Diagnosis not present

## 2023-07-08 DIAGNOSIS — M542 Cervicalgia: Secondary | ICD-10-CM | POA: Diagnosis not present

## 2023-07-17 ENCOUNTER — Other Ambulatory Visit (HOSPITAL_COMMUNITY): Payer: Self-pay | Admitting: Nurse Practitioner

## 2023-07-17 DIAGNOSIS — Z1231 Encounter for screening mammogram for malignant neoplasm of breast: Secondary | ICD-10-CM

## 2023-07-29 ENCOUNTER — Ambulatory Visit (HOSPITAL_COMMUNITY)
Admission: RE | Admit: 2023-07-29 | Discharge: 2023-07-29 | Disposition: A | Discharge status: Home / Self Care | Payer: Medicare HMO | Source: Ambulatory Visit | Attending: Cardiology | Admitting: Cardiology

## 2023-07-29 ENCOUNTER — Encounter: Payer: Self-pay | Admitting: Cardiology

## 2023-07-29 DIAGNOSIS — I350 Nonrheumatic aortic (valve) stenosis: Secondary | ICD-10-CM | POA: Diagnosis not present

## 2023-07-29 DIAGNOSIS — R0602 Shortness of breath: Secondary | ICD-10-CM | POA: Insufficient documentation

## 2023-07-29 LAB — ECHOCARDIOGRAM COMPLETE
AR max vel: 0.68 cm2
AV Area VTI: 0.7 cm2
AV Area mean vel: 0.65 cm2
AV Mean grad: 40 mm[Hg]
AV Peak grad: 64.6 mm[Hg]
Ao pk vel: 4.02 m/s
Area-P 1/2: 4.8 cm2
P 1/2 time: 420 ms
S' Lateral: 2.1 cm

## 2023-07-29 NOTE — Progress Notes (Signed)
*  PRELIMINARY RESULTS* Echocardiogram 2D Echocardiogram has been performed.  Stacey Drain 07/29/2023, 10:25 AM

## 2023-07-30 ENCOUNTER — Ambulatory Visit (HOSPITAL_COMMUNITY)
Admission: RE | Admit: 2023-07-30 | Discharge: 2023-07-30 | Disposition: A | Discharge status: Home / Self Care | Payer: Medicare HMO | Source: Ambulatory Visit | Attending: Nurse Practitioner | Admitting: Nurse Practitioner

## 2023-07-30 DIAGNOSIS — Z1231 Encounter for screening mammogram for malignant neoplasm of breast: Secondary | ICD-10-CM | POA: Diagnosis not present

## 2023-07-31 ENCOUNTER — Ambulatory Visit: Payer: Medicare HMO | Attending: Cardiology | Admitting: Cardiology

## 2023-07-31 ENCOUNTER — Other Ambulatory Visit: Payer: Self-pay | Admitting: Cardiology

## 2023-07-31 ENCOUNTER — Telehealth: Payer: Self-pay | Admitting: *Deleted

## 2023-07-31 ENCOUNTER — Encounter: Payer: Self-pay | Admitting: Cardiology

## 2023-07-31 VITALS — BP 153/60 | HR 68 | Ht 59.0 in | Wt 126.0 lb

## 2023-07-31 DIAGNOSIS — Z01810 Encounter for preprocedural cardiovascular examination: Secondary | ICD-10-CM

## 2023-07-31 DIAGNOSIS — I350 Nonrheumatic aortic (valve) stenosis: Secondary | ICD-10-CM

## 2023-07-31 NOTE — Progress Notes (Signed)
Virtual Visit via Telephone Note   Because of Dawn Lin's co-morbid illnesses, she is at least at moderate risk for complications without adequate follow up.  This format is felt to be most appropriate for this patient at this time.  The patient did not have access to video technology/had technical difficulties with video requiring transitioning to audio format only (telephone).  All issues noted in this document were discussed and addressed.  No physical exam could be performed with this format.  Please refer to the patient's chart for her consent to telehealth for [facility].    Date:  07/31/2023   ID:  Dawn Lin, DOB 1941-02-17, MRN [id number] The patient was identified using 2 identifiers.  Patient Location: Home Provider Location: Office/Clinic   PCP:  Ignatius Specking, MD   [facility] Providers Cardiologist:  Dina Rich, MD     Evaluation Performed:  Follow-Up Visit  Chief Complaint:  Follow up visit, shortness of breath  History of Present Illness:    Dawn Lin is a 82 y.o. female seen today for a focused virtual visit for her history of aortic stenosis and recent symptoms.    1. Aortic stenosis -07/2017 echo  mean grad 16, AVA VTI 1.54 would suggest mild AS. -02/2021 echo LVEF 60-65%, no WMAs, mild to mod AS mean grad 15, AVA VTI, 1.15, DI 0.33 -01/2023 echo: LVEF 60-65%, no WMAs, grade I dd, normal RV function, mod to severe AS mean grad 33, AVA VTI 0.98 DI 0.31 SVI 56. Mod AI - 07/2023 echo: LVEF 70-75%, grade I dd, normal RV function, severe AS with mean grad 40, AVA VTI 0.70, DI 0.25, mild AI    some SOB at times. Typically with walking up inclines or stairs. Fatigue with sweeping or vaccuuming.  - reports these symptoms have been progressing over the last several weeks.     Other medical problems not addressed this visit    1. CAD - noted on prior CT scsan - 07/2017 nuclear stress:  no clear ischemia.  07/2017 echo LVEF 60-65%, grade I diastolic dysfunction    - no chest pains,    2. HTN -  issues with low K on HCTZ, frequent urination - compliant with meds   - home bp's 140s-150s/50s-60s.    3. Hyperlipidemia - she is on on crestor  07/2020 LDL 84 HDL 67 TG 140 TC 175 - 07/2022 TC 198 TG 193 HDL 78 LDL 88       4. Lung adenocarcinoma - s/p resection in 2018   5. SOB - 03/2017 PFTs: minimal perfusion defect       7. Vision change - episode of vision change, lightheadedness while driving - seen at [facility] ER.  - extensive workup in ER was unremarkable. 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 Followed by heme/onc       SH: Sister is [place] who is also coming to establish in our clinic  Past Medical History:  Diagnosis Date   Anemia    Anxiety    Dyspnea    with exertion   Essential hypertension, benign since the 1900's   Family history of adverse reaction to anesthesia    Children - N/V   History of blood transfusion    Hyperlipidemia    Other osteoporosis    Other thalassemia [facility])    Past Surgical History:  Procedure Laterality Date   BIOPSY  12/28/2020   Procedure:  BIOPSY;  Surgeon: Marguerita Merles, Reuel Boom, MD;  [facility];  Service: Gastroenterology;;   COLONOSCOPY     COLONOSCOPY WITH PROPOFOL N/A 12/28/2020   Procedure: COLONOSCOPY WITH PROPOFOL;  Surgeon: Dolores Frame, MD;  [facility]: [facility];  Service: Gastroenterology;  Laterality: N/A;  AM   ESOPHAGOGASTRODUODENOSCOPY (EGD) WITH PROPOFOL N/A 12/28/2020   Procedure: ESOPHAGOGASTRODUODENOSCOPY (EGD) WITH PROPOFOL;  Surgeon: Dolores Frame, MD;  Location: [facility];  Service: Gastroenterology;  Laterality: N/A;   INGUINAL HERNIA REPAIR Left    LUMBAR DISC SURGERY  1990's   for severe pain and sciatica   PARTIAL HYSTERECTOMY  1970's   for bleeding and prolapse   VIDEO ASSISTED  THORACOSCOPY (VATS)/ LOBECTOMY Right 04/20/2017   Procedure: VIDEO ASSISTED THORACOSCOPY (VATS)/RIGHT UPPER LOBECTOMY;  Surgeon: Delight Ovens, MD;  Location: [facility] OR;  Service: Thoracic;  Laterality: Right;   VIDEO BRONCHOSCOPY N/A 04/20/2017   Procedure: VIDEO BRONCHOSCOPY;  Surgeon: Delight Ovens, MD;  Location: [facility] OR;  Service: Thoracic;  Laterality: N/A;     Current Meds  Medication Sig   acetaminophen (TYLENOL) 500 MG tablet Take 1 tablet (500 mg total) by mouth every 6 (six) hours as needed.   Alpha-D-Galactosidase (BEANO PO) Take 1 capsule by mouth daily as needed (bloating).   Ascorbic Acid (VITAMIN C) 500 MG CHEW Chew 500 mg by mouth daily.   aspirin EC 81 MG tablet Take 81 mg by mouth daily.   benzonatate (TESSALON) 100 MG capsule Take 1 capsule (100 mg total) by mouth 3 (three) times daily as needed for cough.   Calcium-Phosphorus-Vitamin D 250-115-250 MG-MG-UNIT CHEW Chew 1 each by mouth 2 (two) times daily.    Cyanocobalamin (VITAMIN B12 SL) Place under the tongue.   fexofenadine (ALLEGRA) 180 MG tablet Take 1 tablet (180 mg total) by mouth daily as needed for allergies.   folic acid (FOLVITE) 1 MG tablet Take 2 mg by mouth daily.   furosemide (LASIX) 20 MG tablet Take 20 mg by mouth daily as needed (swelling).   guaiFENesin (MUCINEX) 600 MG 12 hr tablet Take 600 mg by mouth 2 (two) times daily as needed for cough or to loosen phlegm.   ibuprofen (ADVIL) 200 MG tablet Take 200 mg by mouth every 6 (six) hours as needed for moderate pain.   ipratropium (ATROVENT) 0.03 % nasal spray Place 1 spray into both nostrils 3 (three) times daily as needed for rhinitis.   losartan (COZAAR) 25 MG tablet Take 0.5 tablets (12.5 mg total) by mouth daily.   meclizine (ANTIVERT) 12.5 MG tablet Take 12.5 mg by mouth every 6 (six) hours as needed for dizziness.   Multiple Vitamin (MULTIVITAMIN WITH MINERALS) TABS tablet Take 1 tablet by mouth daily.   potassium chloride SA (KLOR-CON M) 20 MEQ  tablet Take 1 tablet (20 mEq total) by mouth as needed (when taking the lasix for swelling).   predniSONE (DELTASONE) 5 MG tablet Take 5 mg by mouth daily with breakfast.   rosuvastatin (CRESTOR) 5 MG tablet TAKE ONE TABLET BY MOUTH EVERY MORNING   simethicone (MYLICON) 80 MG chewable tablet Chew 80 mg by mouth every 6 (six) hours as needed for flatulence.   [DISCONTINUED] furosemide (LASIX) 20 MG tablet TAKE 1 TABLET (20 MG TOTAL) BY MOUTH AS NEEDED (FOR SWELLING).     Allergies:   Lisinopril   Social History   Tobacco Use   Smoking status: Never   Smokeless tobacco: Never  Substance Use Topics  Alcohol use: No   Drug use: No     Family Hx: The patient's family history includes Asthma in her sister; Heart attack (age of onset: 36) in her sister; Hypertension in her mother.  ROS:   Please see the history of present illness.     All other systems reviewed and are negative.   Prior CV studies:   The following studies were reviewed today:  07/2018 nuclear stress Nuclear stress EF: 80%. There was no ST segment deviation noted during stress. There is a large defect of moderate severity present in the basal anteroseptal, basal inferoseptal, mid anteroseptal, mid inferoseptal, apical septal and apex location. The defect is non-reversible. In the setting of normal LVF, this is likely due to breast attenuation artifact. No ischemia noted. This is a low risk study. The left ventricular ejection fraction is hyperdynamic (>65%).     07/2017 echo Study Conclusions   - Left ventricle: The cavity size was normal. Wall thickness was   normal. Systolic function was normal. The estimated ejection   fraction was in the range of 60% to 65%. Wall motion was normal;   there were no regional wall motion abnormalities. Doppler   parameters are consistent with abnormal left ventricular   relaxation (grade 1 diastolic dysfunction). - Aortic valve: There was trivial regurgitation.     02/2021  echo  1. Left ventricular ejection fraction, by estimation, is 60 to 65%. The  left ventricle has normal function. Left ventricular endocardial border  not optimally defined to evaluate regional wall motion. Left ventricular  diastolic parameters are  indeterminate. Elevated left atrial pressure.   2. Right ventricular systolic function is normal. The right ventricular  size is normal.   3. The mitral valve is normal in structure. No evidence of mitral valve  regurgitation. No evidence of mitral stenosis.   4. The aortic valve is tricuspid. There is mild calcification of the  aortic valve. There is mild thickening of the aortic valve. Aortic valve  regurgitation is mild. Mild to moderate aortic valve stenosis .Aortic  valve mean gradient measures 15.0 mmHg.  Aortic valve peak gradient measures 33.7 mmHg. Aortic valve area, by VTI  measures 1.15 cm.   5. The inferior vena cava is normal in size with greater than 50%  respiratory variability, suggesting right atrial pressure of 3 mmHg.    01/2023 echo IMPRESSIONS     1. Left ventricular ejection fraction, by estimation, is 60 to 65%. The  left ventricle has normal function. The left ventricle has no regional  wall motion abnormalities. Left ventricular diastolic parameters are  consistent with Grade I diastolic  dysfunction (impaired relaxation).   2. Right ventricular systolic function is normal. The right ventricular  size is normal.   3. The mitral valve is normal in structure. Trivial mitral valve  regurgitation. No evidence of mitral stenosis.   4. The aortic valve is tricuspid. There is severe calcifcation of the  aortic valve. Aortic valve regurgitation is moderate. Moderate to severe  aortic valve stenosis. Aortic valve area, by VTI measures 0.98 cm. Aortic  valve mean gradient measures 33.0  mmHg. Aortic valve Vmax measures 3.81 m/s.   5. The inferior vena cava is normal in size with greater than 50%  respiratory  variability, suggesting right atrial pressure of 3 mmHg.    01/2023 carotid US IMPRESSION: Color duplex indicates minimal heterogeneous and calcified plaque, with no hemodynamically significant stenosis by duplex criteria in the extracranial cerebrovascular circulation.  Labs/Other Tests  and Data Reviewed:    EKG:  No ECG reviewed.  Recent Labs: 06/12/2023: ALT 18; Hemoglobin 11.6; Platelets 248   Recent Lipid Panel Lab Results  Component Value Date/Time   CHOL 168 02/18/2018 09:03 AM   TRIG 50 02/18/2018 09:03 AM   HDL 90 02/18/2018 09:03 AM   CHOLHDL 1.9 02/18/2018 09:03 AM   CHOLHDL 3.2 06/10/2011 10:50 AM   LDLCALC 68 02/18/2018 09:03 AM   LDLDIRECT 137 (H) 11/13/2017 03:17 PM    Wt Readings from Last 3 Encounters:  07/31/23 126 lb (57.2 kg)  07/06/23 129 lb 2 oz (58.6 kg)  06/26/23 127 lb (57.6 kg)           Objective:    Vital Signs:  BP (!) 153/60 Comment: home BP reported  Pulse 68 Comment: home heart rate reported  Ht 4\' 11"  (1.499 m)   Wt 126 lb (57.2 kg)   LMP  (LMP Unknown)   BMI 25.45 kg/m    Normal affect. Normal speech pattern and tone. Comfortable, no apparent distress. No audible signs of SOB or wheezing.   ASSESSMENT & PLAN:    1.Severe aortic stenosis - recent echo shows her AS has progressed into the severe range - she reports progressing SOB and DOE consistent with symptomatic AS - we discussed today we are at the point to start pursuing valve replacement. - we will arrange a RHC/LHC with one of our structural cardiologists and ask they continue the workup from there for the patient.     Informed Consent   Shared Decision Making/Informed Consent The risks [stroke (1 in 1000), death (1 in 1000), kidney failure [usually temporary] (1 in 500), bleeding (1 in 200), allergic reaction [possibly serious] (1 in 200)], benefits (diagnostic support and management of coronary artery disease) and alternatives of a cardiac catheterization were  discussed in detail with Ms. Sutera and she is willing to proceed.        Time:   Today, I have spent 14 minutes with the patient with telehealth technology discussing the above problems.     Medication Adjustments/Labs and Tests Ordered: Current medicines are reviewed at length with the patient today.  Concerns regarding medicines are outlined above.   Tests Ordered: No orders of the defined types were placed in this encounter.   Medication Changes: No orders of the defined types were placed in this encounter.   Follow Up:  Keep December f/u  Signed, Dina Rich, MD  07/31/2023 2:03 PM    [facility]

## 2023-07-31 NOTE — Telephone Encounter (Signed)
  Patient Consent for Virtual Visit        Dawn Lin has provided verbal consent on 07/31/2023 for a virtual visit (video or telephone).   CONSENT FOR VIRTUAL VISIT FOR:  Dawn Lin  By participating in this virtual visit I agree to the following:  I hereby voluntarily request, consent and authorize [facility] and its employed or contracted physicians, physician assistants, nurse practitioners or other licensed health care professionals (the Practitioner), to provide me with telemedicine health care services (the "Services") as deemed necessary by the treating Practitioner. I acknowledge and consent to receive the Services by the Practitioner via telemedicine. I understand that the telemedicine visit will involve communicating with the Practitioner through live audiovisual communication technology and the disclosure of certain medical information by electronic transmission. I acknowledge that I have been given the opportunity to request an in-person assessment or other available alternative prior to the telemedicine visit and am voluntarily participating in the telemedicine visit.  I understand that I have the right to withhold or withdraw my consent to the use of telemedicine in the course of my care at any time, without affecting my right to future care or treatment, and that the Practitioner or I may terminate the telemedicine visit at any time. I understand that I have the right to inspect all information obtained and/or recorded in the course of the telemedicine visit and may receive copies of available information for a reasonable fee.  I understand that some of the potential risks of receiving the Services via telemedicine include:  Delay or interruption in medical evaluation due to technological equipment failure or disruption; Information transmitted may not be sufficient (e.g. poor resolution of images) to allow for appropriate medical decision  making by the Practitioner; and/or  In rare instances, security protocols could fail, causing a breach of personal health information.  Furthermore, I acknowledge that it is my responsibility to provide information about my medical history, conditions and care that is complete and accurate to the best of my ability. I acknowledge that Practitioner's advice, recommendations, and/or decision may be based on factors not within their control, such as incomplete or inaccurate data provided by me or distortions of diagnostic images or specimens that may result from electronic transmissions. I understand that the practice of medicine is not an exact science and that Practitioner makes no warranties or guarantees regarding treatment outcomes. I acknowledge that a copy of this consent can be made available to me via my patient portal [facility] MyChart), or I can request a printed copy by calling the office of [facility].    I understand that my insurance will be billed for this visit.   I have read or had this consent read to me. I understand the contents of this consent, which adequately explains the benefits and risks of the Services being provided via telemedicine.  I have been provided ample opportunity to ask questions regarding this consent and the Services and have had my questions answered to my satisfaction. I give my informed consent for the services to be provided through the use of telemedicine in my medical care

## 2023-07-31 NOTE — Patient Instructions (Signed)
Medication Instructions:  Your physician has recommended you make the following change in your medication:  Restart amlodipine 10 mg daily Continue all other medications as prescribed  Labwork: BMET & CBC 08/03/2023 @ [facility] from 8 am to 4 pm Non-fasting  Testing/Procedures: Your physician has requested that you have a cardiac catheterization. Cardiac catheterization is used to diagnose and/or treat various heart conditions. Doctors may recommend this procedure for a number of different reasons. The most common reason is to evaluate chest pain. Chest pain can be a symptom of coronary artery disease (CAD), and cardiac catheterization can show whether plaque is narrowing or blocking your heart's arteries. This procedure is also used to evaluate the valves, as well as measure the blood flow and oxygen levels in different parts of your heart. For further information please visit [email or web address] Please follow instruction sheet, as given.  Follow-Up: Your physician recommends that you schedule a follow-up appointment in: as planned on 09/25/2023  Any Other Special Instructions Will Be Listed Below (If Applicable).  If you need a refill on your cardiac medications before your next appointment, please call your pharmacy.   [facility] A DEPT OF [facility] [address] [place] [address] Dept: [phone] Loc: [phone]  Nettye Kutter  07/31/2023  You are scheduled for a Cardiac Catheterization on Thursday, October 24 with Dr. Verne Carrow.  1. Please arrive at the [facility] (Main Entrance A) at [facility]: [address] [place], [place] [address] at 10:00 AM (This time is 2 hour(s) before your procedure to ensure your preparation). Free valet parking service is available. You will check in at ADMITTING. The support person will be asked to wait in the waiting room.  It is OK to  have someone drop you off and come back when you are ready to be discharged.    Special note: Every effort is made to have your procedure done on time. Please understand that emergencies sometimes delay scheduled procedures.  2. Diet: Do not eat solid foods after midnight.  The patient may have clear liquids until 5am upon the day of the procedure.  3. Labs: You will need to have blood drawn on Monday, October 21 at [facility]. You do not need to be fasting.  4. Medication instructions in preparation for your procedure: hold furosemide and potassium. You may take the rest of your medications that morning with a sip of water.   Contrast Allergy: No  On the morning of your procedure, take your Aspirin 81 mg and any morning medicines NOT listed above.  You may use sips of water.  5. Plan to go home the same day, you will only stay overnight if medically necessary. 6. Bring a current list of your medications and current insurance cards. 7. You MUST have a responsible person to drive you home. 8. Someone MUST be with you the first 24 hours after you arrive home or your discharge will be delayed. 9. Please wear clothes that are easy to get on and off and wear slip-on shoes.  Thank you for allowing [place] to care for you!   -- [facility]

## 2023-07-31 NOTE — H&P (View-Only) (Signed)
Virtual Visit via Telephone Note   Because of Jahzel Scanlon Graciano's co-morbid illnesses, she is at least at moderate risk for complications without adequate follow up.  This format is felt to be most appropriate for this patient at this time.  The patient did not have access to video technology/had technical difficulties with video requiring transitioning to audio format only (telephone).  All issues noted in this document were discussed and addressed.  No physical exam could be performed with this format.  Please refer to the patient's chart for her consent to telehealth for [facility].    Date:  07/31/2023   ID:  [name], DOB 1941-02-17, MRN [id number] The patient was identified using 2 identifiers.  Patient Location: Home Provider Location: Office/Clinic   PCP:  Ignatius Specking, MD   [facility] Providers Cardiologist:  Dina Rich, MD     Evaluation Performed:  Follow-Up Visit  Chief Complaint:  Follow up visit, shortness of breath  History of Present Illness:    [name] is a 82 y.o. female seen today for a focused virtual visit for her history of aortic stenosis and recent symptoms.    1. Aortic stenosis -07/2017 echo  mean grad 16, AVA VTI 1.54 would suggest mild AS. -02/2021 echo LVEF 60-65%, no WMAs, mild to mod AS mean grad 15, AVA VTI, 1.15, DI 0.33 -01/2023 echo: LVEF 60-65%, no WMAs, grade I dd, normal RV function, mod to severe AS mean grad 33, AVA VTI 0.98 DI 0.31 SVI 56. Mod AI - 07/2023 echo: LVEF 70-75%, grade I dd, normal RV function, severe AS with mean grad 40, AVA VTI 0.70, DI 0.25, mild AI    some SOB at times. Typically with walking up inclines or stairs. Fatigue with sweeping or vaccuuming.  - reports these symptoms have been progressing over the last several weeks.     Other medical problems not addressed this visit    1. CAD - noted on prior CT scsan - 07/2017 nuclear stress:  no clear ischemia.  07/2017 echo LVEF 60-65%, grade I diastolic dysfunction    - no chest pains,    2. HTN -  issues with low K on HCTZ, frequent urination - compliant with meds   - home bp's 140s-150s/50s-60s.    3. Hyperlipidemia - she is on on crestor  07/2020 LDL 84 HDL 67 TG 140 TC 175 - 07/2022 TC 198 TG 193 HDL 78 LDL 88       4. Lung adenocarcinoma - s/p resection in 2018   5. SOB - 03/2017 PFTs: minimal perfusion defect       7. Vision change - episode of vision change, lightheadedness while driving - seen at [facility] ER.  - extensive workup in ER was unremarkable. 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 Followed by heme/onc       SH: Sister is [place] who is also coming to establish in our clinic  Past Medical History:  Diagnosis Date   Anemia    Anxiety    Dyspnea    with exertion   Essential hypertension, benign since the 1900's   Family history of adverse reaction to anesthesia    Children - N/V   History of blood transfusion    Hyperlipidemia    Other osteoporosis    Other thalassemia [facility])    Past Surgical History:  Procedure Laterality Date   BIOPSY  12/28/2020   Procedure:  BIOPSY;  Surgeon: Marguerita Merles, Reuel Boom, MD;  Location: [facility];  Service: Gastroenterology;;   COLONOSCOPY     COLONOSCOPY WITH PROPOFOL N/A 12/28/2020   Procedure: COLONOSCOPY WITH PROPOFOL;  Surgeon: Dolores Frame, MD;  Location: [facility];  Service: Gastroenterology;  Laterality: N/A;  AM   ESOPHAGOGASTRODUODENOSCOPY (EGD) WITH PROPOFOL N/A 12/28/2020   Procedure: ESOPHAGOGASTRODUODENOSCOPY (EGD) WITH PROPOFOL;  Surgeon: Dolores Frame, MD;  Location: [facility];  Service: Gastroenterology;  Laterality: N/A;   INGUINAL HERNIA REPAIR Left    LUMBAR DISC SURGERY  1990's   for severe pain and sciatica   PARTIAL HYSTERECTOMY  1970's   for bleeding and prolapse   VIDEO ASSISTED  THORACOSCOPY (VATS)/ LOBECTOMY Right 04/20/2017   Procedure: VIDEO ASSISTED THORACOSCOPY (VATS)/RIGHT UPPER LOBECTOMY;  Surgeon: Delight Ovens, MD;  Location: [facility] OR;  Service: Thoracic;  Laterality: Right;   VIDEO BRONCHOSCOPY N/A 04/20/2017   Procedure: VIDEO BRONCHOSCOPY;  Surgeon: Delight Ovens, MD;  Location: [facility] OR;  Service: Thoracic;  Laterality: N/A;     Current Meds  Medication Sig   acetaminophen (TYLENOL) 500 MG tablet Take 1 tablet (500 mg total) by mouth every 6 (six) hours as needed.   Alpha-D-Galactosidase (BEANO PO) Take 1 capsule by mouth daily as needed (bloating).   Ascorbic Acid (VITAMIN C) 500 MG CHEW Chew 500 mg by mouth daily.   aspirin EC 81 MG tablet Take 81 mg by mouth daily.   benzonatate (TESSALON) 100 MG capsule Take 1 capsule (100 mg total) by mouth 3 (three) times daily as needed for cough.   Calcium-Phosphorus-Vitamin D 250-115-250 MG-MG-UNIT CHEW Chew 1 each by mouth 2 (two) times daily.    Cyanocobalamin (VITAMIN B12 SL) Place under the tongue.   fexofenadine (ALLEGRA) 180 MG tablet Take 1 tablet (180 mg total) by mouth daily as needed for allergies.   folic acid (FOLVITE) 1 MG tablet Take 2 mg by mouth daily.   furosemide (LASIX) 20 MG tablet Take 20 mg by mouth daily as needed (swelling).   guaiFENesin (MUCINEX) 600 MG 12 hr tablet Take 600 mg by mouth 2 (two) times daily as needed for cough or to loosen phlegm.   ibuprofen (ADVIL) 200 MG tablet Take 200 mg by mouth every 6 (six) hours as needed for moderate pain.   ipratropium (ATROVENT) 0.03 % nasal spray Place 1 spray into both nostrils 3 (three) times daily as needed for rhinitis.   losartan (COZAAR) 25 MG tablet Take 0.5 tablets (12.5 mg total) by mouth daily.   meclizine (ANTIVERT) 12.5 MG tablet Take 12.5 mg by mouth every 6 (six) hours as needed for dizziness.   Multiple Vitamin (MULTIVITAMIN WITH MINERALS) TABS tablet Take 1 tablet by mouth daily.   potassium chloride SA (KLOR-CON M) 20 MEQ  tablet Take 1 tablet (20 mEq total) by mouth as needed (when taking the lasix for swelling).   predniSONE (DELTASONE) 5 MG tablet Take 5 mg by mouth daily with breakfast.   rosuvastatin (CRESTOR) 5 MG tablet TAKE ONE TABLET BY MOUTH EVERY MORNING   simethicone (MYLICON) 80 MG chewable tablet Chew 80 mg by mouth every 6 (six) hours as needed for flatulence.   [DISCONTINUED] furosemide (LASIX) 20 MG tablet TAKE 1 TABLET (20 MG TOTAL) BY MOUTH AS NEEDED (FOR SWELLING).     Allergies:   Lisinopril   Social History   Tobacco Use   Smoking status: Never   Smokeless tobacco: Never  Substance Use Topics  Alcohol use: No   Drug use: No     Family Hx: The patient's family history includes Asthma in her sister; Heart attack (age of onset: 36) in her sister; Hypertension in her mother.  ROS:   Please see the history of present illness.     All other systems reviewed and are negative.   Prior CV studies:   The following studies were reviewed today:  07/2018 nuclear stress Nuclear stress EF: 80%. There was no ST segment deviation noted during stress. There is a large defect of moderate severity present in the basal anteroseptal, basal inferoseptal, mid anteroseptal, mid inferoseptal, apical septal and apex location. The defect is non-reversible. In the setting of normal LVF, this is likely due to breast attenuation artifact. No ischemia noted. This is a low risk study. The left ventricular ejection fraction is hyperdynamic (>65%).     07/2017 echo Study Conclusions   - Left ventricle: The cavity size was normal. Wall thickness was   normal. Systolic function was normal. The estimated ejection   fraction was in the range of 60% to 65%. Wall motion was normal;   there were no regional wall motion abnormalities. Doppler   parameters are consistent with abnormal left ventricular   relaxation (grade 1 diastolic dysfunction). - Aortic valve: There was trivial regurgitation.     02/2021  echo  1. Left ventricular ejection fraction, by estimation, is 60 to 65%. The  left ventricle has normal function. Left ventricular endocardial border  not optimally defined to evaluate regional wall motion. Left ventricular  diastolic parameters are  indeterminate. Elevated left atrial pressure.   2. Right ventricular systolic function is normal. The right ventricular  size is normal.   3. The mitral valve is normal in structure. No evidence of mitral valve  regurgitation. No evidence of mitral stenosis.   4. The aortic valve is tricuspid. There is mild calcification of the  aortic valve. There is mild thickening of the aortic valve. Aortic valve  regurgitation is mild. Mild to moderate aortic valve stenosis .Aortic  valve mean gradient measures 15.0 mmHg.  Aortic valve peak gradient measures 33.7 mmHg. Aortic valve area, by VTI  measures 1.15 cm.   5. The inferior vena cava is normal in size with greater than 50%  respiratory variability, suggesting right atrial pressure of 3 mmHg.    01/2023 echo IMPRESSIONS     1. Left ventricular ejection fraction, by estimation, is 60 to 65%. The  left ventricle has normal function. The left ventricle has no regional  wall motion abnormalities. Left ventricular diastolic parameters are  consistent with Grade I diastolic  dysfunction (impaired relaxation).   2. Right ventricular systolic function is normal. The right ventricular  size is normal.   3. The mitral valve is normal in structure. Trivial mitral valve  regurgitation. No evidence of mitral stenosis.   4. The aortic valve is tricuspid. There is severe calcifcation of the  aortic valve. Aortic valve regurgitation is moderate. Moderate to severe  aortic valve stenosis. Aortic valve area, by VTI measures 0.98 cm. Aortic  valve mean gradient measures 33.0  mmHg. Aortic valve Vmax measures 3.81 m/s.   5. The inferior vena cava is normal in size with greater than 50%  respiratory  variability, suggesting right atrial pressure of 3 mmHg.    01/2023 carotid US IMPRESSION: Color duplex indicates minimal heterogeneous and calcified plaque, with no hemodynamically significant stenosis by duplex criteria in the extracranial cerebrovascular circulation.  Labs/Other Tests  and Data Reviewed:    EKG:  No ECG reviewed.  Recent Labs: 06/12/2023: ALT 18; Hemoglobin 11.6; Platelets 248   Recent Lipid Panel Lab Results  Component Value Date/Time   CHOL 168 02/18/2018 09:03 AM   TRIG 50 02/18/2018 09:03 AM   HDL 90 02/18/2018 09:03 AM   CHOLHDL 1.9 02/18/2018 09:03 AM   CHOLHDL 3.2 06/10/2011 10:50 AM   LDLCALC 68 02/18/2018 09:03 AM   LDLDIRECT 137 (H) 11/13/2017 03:17 PM    Wt Readings from Last 3 Encounters:  07/31/23 126 lb (57.2 kg)  07/06/23 129 lb 2 oz (58.6 kg)  06/26/23 127 lb (57.6 kg)           Objective:    Vital Signs:  BP (!) 153/60 Comment: home BP reported  Pulse 68 Comment: home heart rate reported  Ht 4\' 11"  (1.499 m)   Wt 126 lb (57.2 kg)   LMP  (LMP Unknown)   BMI 25.45 kg/m    Normal affect. Normal speech pattern and tone. Comfortable, no apparent distress. No audible signs of SOB or wheezing.   ASSESSMENT & PLAN:    1.Severe aortic stenosis - recent echo shows her AS has progressed into the severe range - she reports progressing SOB and DOE consistent with symptomatic AS - we discussed today we are at the point to start pursuing valve replacement. - we will arrange a RHC/LHC with one of our structural cardiologists and ask they continue the workup from there for the patient.     Informed Consent   Shared Decision Making/Informed Consent The risks [stroke (1 in 1000), death (1 in 1000), kidney failure [usually temporary] (1 in 500), bleeding (1 in 200), allergic reaction [possibly serious] (1 in 200)], benefits (diagnostic support and management of coronary artery disease) and alternatives of a cardiac catheterization were  discussed in detail with Ms. Sutera and she is willing to proceed.        Time:   Today, I have spent 14 minutes with the patient with telehealth technology discussing the above problems.     Medication Adjustments/Labs and Tests Ordered: Current medicines are reviewed at length with the patient today.  Concerns regarding medicines are outlined above.   Tests Ordered: No orders of the defined types were placed in this encounter.   Medication Changes: No orders of the defined types were placed in this encounter.   Follow Up:  Keep December f/u  Signed, Dina Rich, MD  07/31/2023 2:03 PM    [facility]

## 2023-07-31 NOTE — Addendum Note (Signed)
Addended by: Eustace Moore on: 07/31/2023 03:03 PM   Modules accepted: Orders

## 2023-08-03 ENCOUNTER — Other Ambulatory Visit (HOSPITAL_COMMUNITY)
Admission: RE | Admit: 2023-08-03 | Discharge: 2023-08-03 | Disposition: A | Discharge status: Home / Self Care | Payer: Medicare HMO | Source: Ambulatory Visit | Attending: Cardiology | Admitting: Cardiology

## 2023-08-03 DIAGNOSIS — I350 Nonrheumatic aortic (valve) stenosis: Secondary | ICD-10-CM | POA: Insufficient documentation

## 2023-08-03 DIAGNOSIS — Z01810 Encounter for preprocedural cardiovascular examination: Secondary | ICD-10-CM | POA: Diagnosis not present

## 2023-08-03 LAB — CBC
HCT: 40.1 % (ref 36.0–46.0)
Hemoglobin: 12.3 g/dL (ref 12.0–15.0)
MCH: 20.2 pg — ABNORMAL LOW (ref 26.0–34.0)
MCHC: 30.7 g/dL (ref 30.0–36.0)
MCV: 66 fL — ABNORMAL LOW (ref 80.0–100.0)
Platelets: 269 10*3/uL (ref 150–400)
RBC: 6.08 MIL/uL — ABNORMAL HIGH (ref 3.87–5.11)
RDW: 18.6 % — ABNORMAL HIGH (ref 11.5–15.5)
WBC: 10.1 10*3/uL (ref 4.0–10.5)
nRBC: 0.2 % (ref 0.0–0.2)

## 2023-08-03 LAB — BASIC METABOLIC PANEL
Anion gap: 8 (ref 5–15)
BUN: 16 mg/dL (ref 8–23)
CO2: 25 mmol/L (ref 22–32)
Calcium: 9 mg/dL (ref 8.9–10.3)
Chloride: 104 mmol/L (ref 98–111)
Creatinine, Ser: 0.76 mg/dL (ref 0.44–1.00)
GFR, Estimated: >60 mL/min (ref >60)
Glucose, Bld: 86 mg/dL (ref 70–99)
Potassium: 4 mmol/L (ref 3.5–5.1)
Sodium: 137 mmol/L (ref 135–145)

## 2023-08-04 DIAGNOSIS — E213 Hyperparathyroidism, unspecified: Secondary | ICD-10-CM | POA: Diagnosis not present

## 2023-08-04 DIAGNOSIS — Z79899 Other long term (current) drug therapy: Secondary | ICD-10-CM | POA: Diagnosis not present

## 2023-08-05 ENCOUNTER — Telehealth: Payer: Self-pay | Admitting: *Deleted

## 2023-08-05 DIAGNOSIS — M5442 Lumbago with sciatica, left side: Secondary | ICD-10-CM | POA: Diagnosis not present

## 2023-08-05 DIAGNOSIS — M9903 Segmental and somatic dysfunction of lumbar region: Secondary | ICD-10-CM | POA: Diagnosis not present

## 2023-08-05 DIAGNOSIS — M9905 Segmental and somatic dysfunction of pelvic region: Secondary | ICD-10-CM | POA: Diagnosis not present

## 2023-08-05 DIAGNOSIS — M9902 Segmental and somatic dysfunction of thoracic region: Secondary | ICD-10-CM | POA: Diagnosis not present

## 2023-08-05 DIAGNOSIS — M25562 Pain in left knee: Secondary | ICD-10-CM | POA: Diagnosis not present

## 2023-08-05 NOTE — Telephone Encounter (Signed)
Patient is returning call.  °

## 2023-08-05 NOTE — Telephone Encounter (Signed)
Reviewed procedure instructions with patient.  

## 2023-08-05 NOTE — Telephone Encounter (Signed)
Cardiac Catheterization scheduled at [facility] for: Thursday August 06, 2023 12 noon Arrival time [facility] Main Entrance A at: 10 AM  Nothing to eat after midnight prior to procedure, clear liquids until 5 AM day of procedure.  Medication instructions: -Hold:   Lasix/KCl-AM of procedure -Other usual morning medications can be taken with sips of water including aspirin 81 mg.  Plan to go home the same day, you will only stay overnight if medically necessary.  You must have responsible adult to drive you home.  Someone must be with you the first 24 hours after you arrive home.  Left message for patient to call back to review procedure instructions.

## 2023-08-06 ENCOUNTER — Other Ambulatory Visit: Payer: Self-pay

## 2023-08-06 ENCOUNTER — Encounter (HOSPITAL_COMMUNITY)
Admission: RE | Disposition: A | Discharge status: Home / Self Care | Payer: Self-pay | Source: Home / Self Care | Attending: Cardiovascular Disease

## 2023-08-06 ENCOUNTER — Other Ambulatory Visit: Payer: Self-pay | Admitting: Physician Assistant

## 2023-08-06 ENCOUNTER — Ambulatory Visit (HOSPITAL_COMMUNITY)
Admission: RE | Admit: 2023-08-06 | Discharge: 2023-08-06 | Disposition: A | Discharge status: Home / Self Care | Payer: Medicare HMO | Attending: Cardiovascular Disease | Admitting: Cardiovascular Disease

## 2023-08-06 ENCOUNTER — Encounter: Payer: Self-pay | Admitting: Physician Assistant

## 2023-08-06 DIAGNOSIS — R42 Dizziness and giddiness: Secondary | ICD-10-CM | POA: Insufficient documentation

## 2023-08-06 DIAGNOSIS — I2510 Atherosclerotic heart disease of native coronary artery without angina pectoris: Secondary | ICD-10-CM | POA: Diagnosis not present

## 2023-08-06 DIAGNOSIS — I350 Nonrheumatic aortic (valve) stenosis: Secondary | ICD-10-CM

## 2023-08-06 DIAGNOSIS — D591 Autoimmune hemolytic anemia, unspecified: Secondary | ICD-10-CM | POA: Insufficient documentation

## 2023-08-06 DIAGNOSIS — Z79899 Other long term (current) drug therapy: Secondary | ICD-10-CM | POA: Insufficient documentation

## 2023-08-06 DIAGNOSIS — Z7952 Long term (current) use of systemic steroids: Secondary | ICD-10-CM | POA: Insufficient documentation

## 2023-08-06 DIAGNOSIS — E785 Hyperlipidemia, unspecified: Secondary | ICD-10-CM | POA: Insufficient documentation

## 2023-08-06 DIAGNOSIS — I10 Essential (primary) hypertension: Secondary | ICD-10-CM | POA: Insufficient documentation

## 2023-08-06 DIAGNOSIS — R0602 Shortness of breath: Secondary | ICD-10-CM | POA: Insufficient documentation

## 2023-08-06 DIAGNOSIS — Z85118 Personal history of other malignant neoplasm of bronchus and lung: Secondary | ICD-10-CM | POA: Diagnosis not present

## 2023-08-06 HISTORY — PX: RIGHT/LEFT HEART CATH AND CORONARY ANGIOGRAPHY: CATH118266

## 2023-08-06 LAB — POCT I-STAT EG7
Acid-base deficit: 2 mmol/L (ref 0.0–2.0)
Bicarbonate: 23.8 mmol/L (ref 20.0–28.0)
Calcium, Ion: 1.08 mmol/L — ABNORMAL LOW (ref 1.15–1.40)
HCT: 33 % — ABNORMAL LOW (ref 36.0–46.0)
Hemoglobin: 11.2 g/dL — ABNORMAL LOW (ref 12.0–15.0)
O2 Saturation: 71 %
Potassium: 3.3 mmol/L — ABNORMAL LOW (ref 3.5–5.1)
Sodium: 145 mmol/L (ref 135–145)
TCO2: 25 mmol/L (ref 22–32)
pCO2, Ven: 42.5 mm[Hg] — ABNORMAL LOW (ref 44–60)
pH, Ven: 7.357 (ref 7.25–7.43)
pO2, Ven: 39 mm[Hg] (ref 32–45)

## 2023-08-06 LAB — POCT I-STAT 7, (LYTES, BLD GAS, ICA,H+H)
Acid-base deficit: 1 mmol/L (ref 0.0–2.0)
Bicarbonate: 23.9 mmol/L (ref 20.0–28.0)
Calcium, Ion: 1.18 mmol/L (ref 1.15–1.40)
HCT: 35 % — ABNORMAL LOW (ref 36.0–46.0)
Hemoglobin: 11.9 g/dL — ABNORMAL LOW (ref 12.0–15.0)
O2 Saturation: 93 %
Potassium: 3.6 mmol/L (ref 3.5–5.1)
Sodium: 143 mmol/L (ref 135–145)
TCO2: 25 mmol/L (ref 22–32)
pCO2 arterial: 40.7 mm[Hg] (ref 32–48)
pH, Arterial: 7.378 (ref 7.35–7.45)
pO2, Arterial: 68 mm[Hg] — ABNORMAL LOW (ref 83–108)

## 2023-08-06 SURGERY — RIGHT/LEFT HEART CATH AND CORONARY ANGIOGRAPHY
Anesthesia: LOCAL

## 2023-08-06 MED ORDER — LABETALOL HCL 5 MG/ML IV SOLN: 10.0000 mg | INTRAVENOUS | Status: DC | PRN

## 2023-08-06 MED ORDER — LIDOCAINE HCL (PF) 1 % IJ SOLN
INTRAMUSCULAR | Status: DC | PRN
  Administered 2023-08-06 (×2): 2 mL

## 2023-08-06 MED ORDER — SODIUM CHLORIDE 0.9 % IV SOLN: 250.0000 mL | INTRAVENOUS | Status: DC | PRN

## 2023-08-06 MED ORDER — SODIUM CHLORIDE 0.9 % IV SOLN
INTRAVENOUS | Status: AC
Start: 2023-08-06 — End: 2023-08-06

## 2023-08-06 MED ORDER — MIDAZOLAM HCL 2 MG/2ML IJ SOLN
INTRAMUSCULAR | Status: AC
  Filled 2023-08-06: qty 2

## 2023-08-06 MED ORDER — ASPIRIN 81 MG PO CHEW: 81.0000 mg | CHEWABLE_TABLET | ORAL | Status: DC

## 2023-08-06 MED ORDER — VERAPAMIL HCL 2.5 MG/ML IV SOLN
INTRAVENOUS | Status: DC | PRN
  Administered 2023-08-06: 10 mL via INTRA_ARTERIAL

## 2023-08-06 MED ORDER — SODIUM CHLORIDE 0.9% FLUSH: 3.0000 mL | Freq: Two times a day (BID) | INTRAVENOUS | Status: DC

## 2023-08-06 MED ORDER — FENTANYL CITRATE (PF) 100 MCG/2ML IJ SOLN
INTRAMUSCULAR | Status: DC | PRN
  Administered 2023-08-06: 25 ug via INTRAVENOUS

## 2023-08-06 MED ORDER — LIDOCAINE HCL (PF) 1 % IJ SOLN
INTRAMUSCULAR | Status: AC
  Filled 2023-08-06: qty 30

## 2023-08-06 MED ORDER — SODIUM CHLORIDE 0.9 % WEIGHT BASED INFUSION
3.0000 mL/kg/h | INTRAVENOUS | Status: AC
  Administered 2023-08-06: 3 mL/kg/h via INTRAVENOUS

## 2023-08-06 MED ORDER — SODIUM CHLORIDE 0.9 % WEIGHT BASED INFUSION: 1.0000 mL/kg/h | INTRAVENOUS | Status: DC

## 2023-08-06 MED ORDER — FENTANYL CITRATE (PF) 100 MCG/2ML IJ SOLN
INTRAMUSCULAR | Status: AC
  Filled 2023-08-06: qty 2

## 2023-08-06 MED ORDER — VERAPAMIL HCL 2.5 MG/ML IV SOLN
INTRAVENOUS | Status: AC
  Filled 2023-08-06: qty 2

## 2023-08-06 MED ORDER — ONDANSETRON HCL 4 MG/2ML IJ SOLN: 4.0000 mg | Freq: Four times a day (QID) | INTRAMUSCULAR | Status: DC | PRN

## 2023-08-06 MED ORDER — HEPARIN SODIUM (PORCINE) 1000 UNIT/ML IJ SOLN
INTRAMUSCULAR | Status: DC | PRN
  Administered 2023-08-06: 3000 [IU] via INTRAVENOUS

## 2023-08-06 MED ORDER — METOPROLOL TARTRATE 50 MG PO TABS: 50.0000 mg | ORAL_TABLET | ORAL | 0 refills | Status: DC

## 2023-08-06 MED ORDER — SODIUM CHLORIDE 0.9% FLUSH
3.0000 mL | INTRAVENOUS | Status: DC | PRN
Start: 2023-08-06 — End: 2023-08-06

## 2023-08-06 MED ORDER — MIDAZOLAM HCL 2 MG/2ML IJ SOLN
INTRAMUSCULAR | Status: DC | PRN
  Administered 2023-08-06: 1 mg via INTRAVENOUS

## 2023-08-06 MED ORDER — HEPARIN (PORCINE) IN NACL 1000-0.9 UT/500ML-% IV SOLN
INTRAVENOUS | Status: DC | PRN
  Administered 2023-08-06: 500 mL

## 2023-08-06 MED ORDER — ACETAMINOPHEN 325 MG PO TABS
650.0000 mg | ORAL_TABLET | ORAL | Status: DC | PRN
Start: 2023-08-06 — End: 2023-08-06

## 2023-08-06 MED ORDER — IOHEXOL 350 MG/ML SOLN
INTRAVENOUS | Status: DC | PRN
  Administered 2023-08-06: 32 mL

## 2023-08-06 MED ORDER — HEPARIN SODIUM (PORCINE) 1000 UNIT/ML IJ SOLN
INTRAMUSCULAR | Status: AC
  Filled 2023-08-06: qty 10

## 2023-08-06 MED ORDER — HYDRALAZINE HCL 20 MG/ML IJ SOLN: 10.0000 mg | INTRAMUSCULAR | Status: DC | PRN

## 2023-08-06 SURGICAL SUPPLY — 12 items
CATH 5FR JL3.5 JR4 ANG PIG MP (CATHETERS) IMPLANT
CATH BALLN WEDGE 5F 110CM (CATHETERS) IMPLANT
CATH INFINITI 5FR JL4 (CATHETERS) IMPLANT
CATH LAUNCHER 5F JL3 (CATHETERS) IMPLANT
CATHETER LAUNCHER 5F JL3 (CATHETERS) ×1
DEVICE RAD COMP TR BAND LRG (VASCULAR PRODUCTS) IMPLANT
GLIDESHEATH SLEND SS 6F .021 (SHEATH) IMPLANT
GUIDEWIRE INQWIRE 1.5J.035X260 (WIRE) IMPLANT
INQWIRE 1.5J .035X260CM (WIRE) ×1
PACK CARDIAC CATHETERIZATION (CUSTOM PROCEDURE TRAY) ×1 IMPLANT
SET ATX-X65L (MISCELLANEOUS) IMPLANT
SHEATH GLIDE SLENDER 4/5FR (SHEATH) IMPLANT

## 2023-08-06 NOTE — Discharge Instructions (Signed)

## 2023-08-06 NOTE — Addendum Note (Signed)
Addended by: Janetta Hora on: 08/06/2023 03:11 PM   Modules accepted: Orders

## 2023-08-06 NOTE — Interval H&P Note (Signed)
History and Physical Interval Note:  08/06/2023 11:11 AM  Dawn Lin Dawn Lin  has presented today for surgery, with the diagnosis of aortic stenosis.  The various methods of treatment have been discussed with the patient and family. After consideration of risks, benefits and other options for treatment, the patient has consented to  Procedure(s): RIGHT/LEFT HEART CATH AND CORONARY ANGIOGRAPHY (N/A) as a surgical intervention.  The patient's history has been reviewed, patient examined, no change in status, stable for surgery.  I have reviewed the patient's chart and labs.  Questions were answered to the patient's satisfaction.    Cath Lab Visit (complete for each Cath Lab visit)  Clinical Evaluation Leading to the Procedure:   ACS: No.  Non-ACS:    Anginal Classification: CCS I  Anti-ischemic medical therapy: Minimal Therapy (1 class of medications)  Non-Invasive Test Results: No non-invasive testing performed  Prior CABG: No previous CABG        Verne Carrow

## 2023-08-07 ENCOUNTER — Encounter (HOSPITAL_COMMUNITY): Payer: Self-pay | Admitting: Cardiovascular Disease

## 2023-08-11 ENCOUNTER — Ambulatory Visit (HOSPITAL_COMMUNITY)
Admission: RE | Admit: 2023-08-11 | Discharge: 2023-08-11 | Disposition: A | Discharge status: Home / Self Care | Payer: Medicare HMO | Source: Ambulatory Visit | Attending: Physician Assistant | Admitting: Physician Assistant

## 2023-08-11 DIAGNOSIS — N83201 Unspecified ovarian cyst, right side: Secondary | ICD-10-CM | POA: Diagnosis not present

## 2023-08-11 DIAGNOSIS — N838 Other noninflammatory disorders of ovary, fallopian tube and broad ligament: Secondary | ICD-10-CM | POA: Diagnosis not present

## 2023-08-11 DIAGNOSIS — Z48810 Encounter for surgical aftercare following surgery on the sense organs: Secondary | ICD-10-CM | POA: Diagnosis not present

## 2023-08-11 DIAGNOSIS — R932 Abnormal findings on diagnostic imaging of liver and biliary tract: Secondary | ICD-10-CM | POA: Diagnosis not present

## 2023-08-11 DIAGNOSIS — I350 Nonrheumatic aortic (valve) stenosis: Secondary | ICD-10-CM | POA: Diagnosis not present

## 2023-08-11 MED ORDER — IOHEXOL 350 MG/ML SOLN
95.0000 mL | Freq: Once | INTRAVENOUS | Status: AC | PRN
  Administered 2023-08-11: 95 mL via INTRAVENOUS

## 2023-08-12 ENCOUNTER — Encounter: Payer: Self-pay | Admitting: Physician Assistant

## 2023-08-19 DIAGNOSIS — M9902 Segmental and somatic dysfunction of thoracic region: Secondary | ICD-10-CM | POA: Diagnosis not present

## 2023-08-19 DIAGNOSIS — M9905 Segmental and somatic dysfunction of pelvic region: Secondary | ICD-10-CM | POA: Diagnosis not present

## 2023-08-19 DIAGNOSIS — M5442 Lumbago with sciatica, left side: Secondary | ICD-10-CM | POA: Diagnosis not present

## 2023-08-19 DIAGNOSIS — M25562 Pain in left knee: Secondary | ICD-10-CM | POA: Diagnosis not present

## 2023-08-19 DIAGNOSIS — M9903 Segmental and somatic dysfunction of lumbar region: Secondary | ICD-10-CM | POA: Diagnosis not present

## 2023-08-26 NOTE — Progress Notes (Unsigned)
[address]       [place] [address]             [phone]           Kenslee Chromy [facility] Medical Record #[id number] Date of Birth: Apr 23, 1941  Ignatius Specking, MD Ignatius Specking, MD  Chief Complaint:   Increasing DOE  History of Present Illness:     Pt is a very pleasant 82 yo female who has known heart murmur for many years and followed for AS. Pt recently had echo with increasing gradient to where now it is a mean gradient of and with a valve area of 0.7cm2. Pt with preserved LV function. Due to her worsening gradient and now feeling over the past few weeks increasing DOE with activity it was felt that AVR was indicated. She has no lightheadedness nor CP. She has a history of adenocarcinoma of the lung sp lobectomy and has been cancer free. She has also hemolytic anemia. Her work up with cath without CAD and CTA with anatomy favoring Evolute valve 26mm.       Past Medical History:  Diagnosis Date   Anemia    Anxiety    Essential hypertension, benign since the 1900's   Family history of adverse reaction to anesthesia    Children - N/V   History of blood transfusion    History of lung cancer    Lung adenocarcinoma s/p resection 2018   Hyperlipidemia    Other osteoporosis    Other thalassemia (HCC)    Severe aortic stenosis     Past Surgical History:  Procedure Laterality Date   BIOPSY  12/28/2020   Procedure: BIOPSY;  Surgeon: Dolores Frame, MD;  [facility]: [facility];  Service: Gastroenterology;;   COLONOSCOPY     COLONOSCOPY WITH PROPOFOL N/A 12/28/2020   Procedure: COLONOSCOPY WITH PROPOFOL;  Surgeon: Dolores Frame, MD;  [facility];  Service: Gastroenterology;  Laterality: N/A;  AM   ESOPHAGOGASTRODUODENOSCOPY (EGD) WITH PROPOFOL N/A 12/28/2020   Procedure: ESOPHAGOGASTRODUODENOSCOPY (EGD) WITH PROPOFOL;  Surgeon: Dolores Frame, MD;  Location: [facility];  Service:  Gastroenterology;  Laterality: N/A;   INGUINAL HERNIA REPAIR Left    LUMBAR DISC SURGERY  1990's   for severe pain and sciatica   PARTIAL HYSTERECTOMY  1970's   for bleeding and prolapse   RIGHT/LEFT HEART CATH AND CORONARY ANGIOGRAPHY N/A 08/06/2023   Procedure: RIGHT/LEFT HEART CATH AND CORONARY ANGIOGRAPHY;  Surgeon: Kathleene Hazel, MD;  [facility]: [facility];  Service: Cardiovascular;  Laterality: N/A;   VIDEO ASSISTED THORACOSCOPY (VATS)/ LOBECTOMY Right 04/20/2017   Procedure: VIDEO ASSISTED THORACOSCOPY (VATS)/RIGHT UPPER LOBECTOMY;  Surgeon: Delight Ovens, MD;  Location: [facility] OR;  Service: Thoracic;  Laterality: Right;   VIDEO BRONCHOSCOPY N/A 04/20/2017   Procedure: VIDEO BRONCHOSCOPY;  Surgeon: Delight Ovens, MD;  Location: [facility] OR;  Service: Thoracic;  Laterality: N/A;    Social History   Tobacco Use  Smoking Status Never  Smokeless Tobacco Never    Social History   Substance and Sexual Activity  Alcohol Use No    Social History   Socioeconomic History   Marital status: Widowed    Spouse name: Not on file   Number of children: Not on file   Years of education: Not on file   Highest education level: Not on file  Occupational History   Occupation: retired  Tobacco Use  Smoking status: Never   Smokeless tobacco: Never  Substance and Sexual Activity   Alcohol use: No   Drug use: No   Sexual activity: Never  Other Topics Concern   Not on file  Social History Narrative   Mrs. Baral is married. Her husband has been chronically ill for 6 years (2011). He has metastatic prostate cancer and multiple myeloma. 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 She has a lot of family support.    Social Determinants of Health   Financial Resource Strain: Low Risk  (11/27/2021)   Received from [facility], [facility]   Overall Financial Resource Strain (CARDIA)    Difficulty of Paying Living Expenses: Not hard at all  Food  Insecurity: No Food Insecurity (11/27/2021)   Received from [facility], [facility]   Hunger Vital Sign    Worried About Running Out of Food in the Last Year: Never true    Ran Out of Food in the Last Year: Never true  Transportation Needs: No Transportation Needs (11/27/2021)   Received from [facility], [facility]   [facility] - Transportation    Lack of Transportation (Medical): No    Lack of Transportation (Non-Medical): No  Physical Activity: Inactive (11/27/2021)   Received from [facility], [facility]   Exercise Vital Sign    Days of Exercise per Week: 0 days    Minutes of Exercise per Session: 0 min  Stress: No Stress Concern Present (11/27/2021)   Received from [facility], [facility] - Occupational Stress Questionnaire    Feeling of Stress : Not at all  Social Connections: Moderately Isolated (11/27/2021)   Received from [facility], [facility]   Social Connection and Isolation Panel [NHANES]    Frequency of Communication with Friends and Family: More than three times a week    Frequency of Social Gatherings with Friends and Family: More than three times a week    Attends Religious Services: More than 4 times per year    Active Member of [employer] or Organizations: No    Attends Banker Meetings: Never    Marital Status: Widowed  Intimate Partner Violence: Not At Risk (11/27/2021)   Received from [facility], [facility]   Humiliation, Afraid, Rape, and Kick questionnaire    Fear of Current or Ex-Partner: No    Emotionally Abused: No    Physically Abused: No    Sexually Abused: No    Allergies  Allergen Reactions   Lisinopril Cough    Current Outpatient Medications  Medication Sig Dispense Refill   acetaminophen (TYLENOL) 500 MG tablet Take 1 tablet (500 mg total) by mouth every 6 (six) hours as needed. (Patient taking differently: Take 500-1,000 mg by mouth every 6  (six) hours as needed for moderate pain (pain score 4-6).) 30 tablet 0   Alpha-D-Galactosidase (BEANO PO) Take 2 capsules by mouth daily as needed (bloating).     amLODipine (NORVASC) 10 MG tablet Take 10 mg by mouth daily.     Ascorbic Acid (VITAMIN C) 500 MG CHEW Chew 1,000 mg by mouth daily.     aspirin EC 81 MG tablet Take 81 mg by mouth daily.     Calcium Carb-Cholecalciferol (CALCIUM 500 + D PO) Take 1 tablet by mouth 2 (two) times daily.     cyanocobalamin (VITAMIN B12) 1000 MCG  tablet Take 1,000 mcg by mouth daily.     fexofenadine (ALLEGRA) 180 MG tablet Take 1 tablet (180 mg total) by mouth daily as needed for allergies. (Patient taking differently: Take 180 mg by mouth daily.) 90 tablet 3   folic acid (FOLVITE) 1 MG tablet Take 2 mg by mouth daily.     furosemide (LASIX) 20 MG tablet Take 20 mg by mouth daily as needed (swelling).     guaifenesin (HUMIBID E) 400 MG TABS tablet Take 400 mg by mouth daily.     ibuprofen (ADVIL) 200 MG tablet Take 200 mg by mouth every 6 (six) hours as needed for moderate pain.     ipratropium (ATROVENT) 0.03 % nasal spray Place 1 spray into both nostrils 3 (three) times daily as needed for rhinitis. (Patient taking differently: Place 1 spray into both nostrils daily.) 90 mL 3   losartan (COZAAR) 25 MG tablet Take 0.5 tablets (12.5 mg total) by mouth daily. 45 tablet 3   meclizine (ANTIVERT) 12.5 MG tablet Take 12.5 mg by mouth every 6 (six) hours as needed for dizziness.     metoprolol tartrate (LOPRESSOR) 50 MG tablet Take 1 tablet (50 mg total) by mouth as directed for 1 dose. 2 hours prior to CT scan to slow heart rate down 1 tablet 0   Multiple Vitamin (MULTIVITAMIN WITH MINERALS) TABS tablet Take 2 tablets by mouth daily.     potassium chloride SA (KLOR-CON M) 20 MEQ tablet Take 1 tablet (20 mEq total) by mouth as needed (when taking the lasix for swelling). 90 tablet 3   predniSONE (DELTASONE) 5 MG tablet Take 5 mg by mouth daily with breakfast.      rosuvastatin (CRESTOR) 5 MG tablet TAKE ONE TABLET BY MOUTH EVERY MORNING 90 tablet 2   simethicone (MYLICON) 80 MG chewable tablet Chew 160 mg by mouth every 6 (six) hours as needed for flatulence.     Zinc 30 MG CAPS Take 60 mg by mouth daily.     No current facility-administered medications for this visit.     Family History  Problem Relation Age of Onset   Hypertension Mother    Asthma Sister    Heart attack Sister 71       Physical Exam: LMP  (LMP Unknown)  Teeth in good repair Lungs: clear Card: RR with 3/6 sem Ext: warm no edema Neuro: alert and no focal deficits   Diagnostic Studies & Laboratory data: I have personally reviewed the following studies and agree with the findings   TTE (07/2023) IMPRESSIONS     1. Left ventricular ejection fraction, by estimation, is 70 to 75%. The  left ventricle has hyperdynamic function. Left ventricular endocardial  border not optimally defined to evaluate regional wall motion. There is  mild left ventricular hypertrophy.  Left ventricular diastolic parameters are consistent with Grade I  diastolic dysfunction (impaired relaxation). Elevated left atrial  pressure.   2. Right ventricular systolic function is normal. The right ventricular  size is normal. Tricuspid regurgitation signal is inadequate for assessing  PA pressure.   3. The mitral valve is normal in structure. No evidence of mitral valve  regurgitation. No evidence of mitral stenosis.   4. The aortic valve has an indeterminant number of cusps. There is  moderate calcification of the aortic valve. There is moderate thickening  of the aortic valve. Aortic valve regurgitation is mild. Severe aortic  valve stenosis. Severe aortic stenosis is  present. Aortic valve mean gradient measures  40.0 mmHg. Aortic valve peak  gradient measures 64.6 mmHg. Aortic valve area, by VTI measures 0.70 cm.      DI 0.25   5. The inferior vena cava is normal in size with greater than 50%   respiratory variability, suggesting right atrial pressure of 3 mmHg.   FINDINGS   Left Ventricle: Left ventricular ejection fraction, by estimation, is 70  to 75%. The left ventricle has hyperdynamic function. Left ventricular  endocardial border not optimally defined to evaluate regional wall motion.  The left ventricular internal  cavity size was normal in size. There is mild left ventricular  hypertrophy. Left ventricular diastolic parameters are consistent with  Grade I diastolic dysfunction (impaired relaxation). Elevated left atrial  pressure.   Right Ventricle: The right ventricular size is normal. Right vetricular  wall thickness was not well visualized. Right ventricular systolic  function is normal. Tricuspid regurgitation signal is inadequate for  assessing PA pressure.   Left Atrium: Left atrial size was normal in size.   Right Atrium: Right atrial size was normal in size.   Pericardium: There is no evidence of pericardial effusion.   Mitral Valve: The mitral valve is normal in structure. No evidence of  mitral valve regurgitation. No evidence of mitral valve stenosis.   Tricuspid Valve: The tricuspid valve is normal in structure. Tricuspid  valve regurgitation is trivial. No evidence of tricuspid stenosis.   Aortic Valve: The aortic valve has an indeterminant number of cusps. There  is moderate calcification of the aortic valve. There is moderate  thickening of the aortic valve. There is moderate aortic valve annular  calcification. Aortic valve regurgitation  is mild. Aortic regurgitation PHT measures 420 msec. Severe aortic  stenosis is present. Aortic valve mean gradient measures 40.0 mmHg. Aortic  valve peak gradient measures 64.6 mmHg. Aortic valve area, by VTI measures  0.70 cm.   Pulmonic Valve: The pulmonic valve was not well visualized. Pulmonic valve  regurgitation is not visualized. No evidence of pulmonic stenosis.   Aorta: The aortic root is  normal in size and structure.   Venous: The inferior vena cava is normal in size with greater than 50%  respiratory variability, suggesting right atrial pressure of 3 mmHg.   IAS/Shunts: No atrial level shunt detected by color flow Doppler.     LEFT VENTRICLE  PLAX 2D  LVIDd:         4.00 cm   Diastology  LVIDs:         2.10 cm   LV e' medial:    5.33 cm/s  LV PW:         1.10 cm   LV E/e' medial:  15.8  LV IVS:        1.20 cm   LV e' lateral:   5.11 cm/s  LVOT diam:     1.90 cm   LV E/e' lateral: 16.4  LV SV:         73  LV SV Index:   48  LVOT Area:     2.84 cm     RIGHT VENTRICLE  RV S prime:     10.70 cm/s  TAPSE (M-mode): 2.1 cm   LEFT ATRIUM             Index        RIGHT ATRIUM           Index  LA diam:        3.20 cm 2.09 cm/m   RA Area:  10.40 cm  LA Vol (A2C):   61.6 ml 40.30 ml/m  RA Volume:   20.30 ml  13.28 ml/m  LA Vol (A4C):   40.3 ml 26.37 ml/m  LA Biplane Vol: 49.7 ml 32.52 ml/m   AORTIC VALVE  AV Area (Vmax):    0.68 cm  AV Area (Vmean):   0.65 cm  AV Area (VTI):     0.70 cm  AV Vmax:           402.00 cm/s  AV Vmean:          298.667 cm/s  AV VTI:            1.040 m  AV Peak Grad:      64.6 mmHg  AV Mean Grad:      40.0 mmHg  LVOT Vmax:         96.85 cm/s  LVOT Vmean:        68.850 cm/s  LVOT VTI:          0.258 m  LVOT/AV VTI ratio: 0.25  AI PHT:            420 msec    AORTA  Ao Root diam: 3.10 cm   MITRAL VALVE  MV Area (PHT): 4.80 cm    SHUNTS  MV Decel Time: 158 msec    Systemic VTI:  0.26 m  MV E velocity: 84.00 cm/s  Systemic Diam: 1.90 cm  MV A velocity: 98.50 cm/s  MV E/A ratio:  0.85   CATH (07/2023) Conclusion  No angiographic evidence of CAD Normal right and left heart pressures (RA 5, RV 33/4/7, PA 36/12 mean 21, PCWP 10, LV 171/11/13) Peak to peak gradient 38 mmHg.    Recent Radiology Findings:   CTA (07/2023) FINDINGS: Image quality: Excellent.   Noise artifact is: Limited.   Valve Morphology: Tricuspid  aortic valve with severe leaflet thickening. Moderate leaflet calcifications. Restricted leaflet movement in systole.   Aortic Valve Calcium score: 653   Aortic annular dimension:   Phase assessed: 25%   Annular area: 340 mm2   Annular perimeter: 66.3 mm   Max diameter: 22.9 mm   Min diameter: 18.9 mm   Annular and subannular calcification: None.   Membranous septum length: 6.6 mm   Optimal coplanar projection: LAO 6 CAU 11   Coronary Artery Height above Annulus:   Left Main: 14.4 mm   Right Coronary: 17.1 mm   Sinus of Valsalva Measurements:   Non-coronary: 27.8 mm   Right-coronary: 27.9 mm   Left-coronary: 28.6 mm   Sinus of Valsalva Height:   Non-coronary: 21.9 mm   Right-coronary: 20.0 mm   Left-coronary: 18.7 mm   Sinotubular Junction: 24 mm   Ascending Thoracic Aorta: 30 mm   Coronary Arteries: Normal coronary origin. Left dominance. The study was performed without use of NTG and is insufficient for plaque evaluation. Please refer to recent cardiac catheterization for coronary assessment.   Cardiac Morphology:   Right Atrium: Right atrial size is within normal limits.   Right Ventricle: The right ventricular cavity is within normal limits.   Left Atrium: Left atrial size is normal in size with no left atrial appendage filling defect.   Left Ventricle: The ventricular cavity size is within normal limits.   Pulmonary arteries: Normal in size without proximal filling defect.   Pulmonary veins: Normal pulmonary venous drainage.   Pericardium: Normal thickness with no significant effusion or calcium present.   Mitral Valve: The mitral valve is  normal structure without significant calcification.   Extra-cardiac findings: See attached radiology report for non-cardiac structures.   IMPRESSION: 1. Annular measurements are in between a 20/23 mm S3. Findings do support a 26 mm Evolut Pro.   2. No significant annular or subannular  calcifications.   3. Sufficient coronary to annulus distance.   4. Optimal Fluoroscopic Angle for Delivery: LAO 6 CAU 11      Recent Lab Findings: Lab Results  Component Value Date   WBC 10.1 08/03/2023   HGB 11.2 (L) 08/06/2023   HCT 33.0 (L) 08/06/2023   PLT 269 08/03/2023   GLUCOSE 86 08/03/2023   CHOL 168 02/18/2018   TRIG 50 02/18/2018   HDL 90 02/18/2018   LDLDIRECT 137 (H) 11/13/2017   LDLCALC 68 02/18/2018   ALT 18 06/12/2023   AST 28 06/12/2023   NA 145 08/06/2023   K 3.3 (L) 08/06/2023   CL 104 08/03/2023   CREATININE 0.76 08/03/2023   BUN 16 08/03/2023   CO2 25 08/03/2023   INR 0.96 04/16/2017      Assessment / Plan:     82 yo female with NYHA class 2 symptoms of severe AS with normal LV function and no CAD. Pt has a class I indication for AVR and secondary to her age and comorbidities of previous lung surgery due to adenocarcinoma and hemolytic anemia would best be served with TAVR. Pt would be a surgical bail out if needed. Will have femoral access with Evolute 26mm valve. All the risks, goals and recovery of surgery were discussed and pt understands and her and her son wish to proceed   I have spent 60- min in review of the records, viewing studies and in face to face with patient and in coordination of future care    Eugenio Hoes 08/26/2023 12:24 PM

## 2023-08-27 ENCOUNTER — Encounter: Payer: Self-pay | Admitting: Thoracic Surgery (Cardiothoracic Vascular Surgery)

## 2023-08-27 ENCOUNTER — Institutional Professional Consult (permissible substitution): Payer: Medicare HMO | Admitting: Thoracic Surgery (Cardiothoracic Vascular Surgery)

## 2023-08-27 VITALS — BP 155/68 | HR 78 | Resp 20 | Ht 59.0 in | Wt 127.0 lb

## 2023-08-27 DIAGNOSIS — I350 Nonrheumatic aortic (valve) stenosis: Secondary | ICD-10-CM

## 2023-08-27 NOTE — Patient Instructions (Signed)
TAVR

## 2023-08-27 NOTE — Progress Notes (Signed)
Pre Surgical Assessment: 5 M Walk Test  80M=16.57ft  5 Meter Walk Test- trial 1: 10.23 seconds 5 Meter Walk Test- trial 2: 9.73 seconds 5 Meter Walk Test- trial 3: 10.18 seconds 5 Meter Walk Test Average: 10.04 seconds   _________________________   Procedure Type: Isolated AVR  Perioperative Outcome Estimate %  Operative Mortality 4.12%  Morbidity & Mortality 8.51%  Stroke 1.35%  Renal Failure 1.02%  Reoperation 3.5%  Prolonged Ventilation 4.39%  Deep Sternal Wound Infection 0.039%  Long Hospital Stay (>14 days) 3.9%  Short Hospital Stay (<6 days)* 41.8%

## 2023-08-31 DIAGNOSIS — R0981 Nasal congestion: Secondary | ICD-10-CM | POA: Diagnosis not present

## 2023-08-31 DIAGNOSIS — I10 Essential (primary) hypertension: Secondary | ICD-10-CM | POA: Diagnosis not present

## 2023-08-31 DIAGNOSIS — J069 Acute upper respiratory infection, unspecified: Secondary | ICD-10-CM | POA: Diagnosis not present

## 2023-08-31 DIAGNOSIS — F3342 Major depressive disorder, recurrent, in full remission: Secondary | ICD-10-CM | POA: Diagnosis not present

## 2023-09-02 ENCOUNTER — Other Ambulatory Visit: Payer: Self-pay

## 2023-09-02 DIAGNOSIS — I350 Nonrheumatic aortic (valve) stenosis: Secondary | ICD-10-CM

## 2023-09-11 ENCOUNTER — Ambulatory Visit (HOSPITAL_COMMUNITY)
Admission: RE | Admit: 2023-09-11 | Discharge: 2023-09-11 | Disposition: A | Discharge status: Home / Self Care | Payer: Medicare HMO | Source: Ambulatory Visit | Attending: Internal Medicine

## 2023-09-11 ENCOUNTER — Other Ambulatory Visit: Payer: Self-pay

## 2023-09-11 ENCOUNTER — Encounter (HOSPITAL_COMMUNITY)
Admission: RE | Admit: 2023-09-11 | Discharge: 2023-09-11 | Disposition: A | Discharge status: Home / Self Care | Payer: Medicare HMO | Source: Ambulatory Visit | Attending: Internal Medicine | Admitting: Internal Medicine

## 2023-09-11 DIAGNOSIS — Z01811 Encounter for preprocedural respiratory examination: Secondary | ICD-10-CM | POA: Diagnosis present

## 2023-09-11 DIAGNOSIS — Z01818 Encounter for other preprocedural examination: Secondary | ICD-10-CM | POA: Insufficient documentation

## 2023-09-11 DIAGNOSIS — Z01812 Encounter for preprocedural laboratory examination: Secondary | ICD-10-CM | POA: Diagnosis present

## 2023-09-11 DIAGNOSIS — R0989 Other specified symptoms and signs involving the circulatory and respiratory systems: Secondary | ICD-10-CM | POA: Diagnosis not present

## 2023-09-11 DIAGNOSIS — I350 Nonrheumatic aortic (valve) stenosis: Secondary | ICD-10-CM | POA: Insufficient documentation

## 2023-09-11 DIAGNOSIS — R918 Other nonspecific abnormal finding of lung field: Secondary | ICD-10-CM | POA: Insufficient documentation

## 2023-09-11 DIAGNOSIS — Z01810 Encounter for preprocedural cardiovascular examination: Secondary | ICD-10-CM | POA: Diagnosis present

## 2023-09-11 LAB — CBC
HCT: 33.8 % — ABNORMAL LOW (ref 36.0–46.0)
Hemoglobin: 12.4 g/dL (ref 12.0–15.0)
MCH: 26.6 pg (ref 26.0–34.0)
MCHC: 36.7 g/dL — ABNORMAL HIGH (ref 30.0–36.0)
MCV: 72.4 fL — ABNORMAL LOW (ref 80.0–100.0)
Platelets: 255 10*3/uL (ref 150–400)
RBC: 4.67 MIL/uL (ref 3.87–5.11)
RDW: 20 % — ABNORMAL HIGH (ref 11.5–15.5)
WBC: 16.7 10*3/uL — ABNORMAL HIGH (ref 4.0–10.5)
nRBC: 0.2 % (ref 0.0–0.2)

## 2023-09-11 LAB — SURGICAL PCR SCREEN
MRSA, PCR: NEGATIVE
Staphylococcus aureus: NEGATIVE

## 2023-09-11 LAB — URINALYSIS, ROUTINE W REFLEX MICROSCOPIC
Bilirubin Urine: NEGATIVE
Glucose, UA: NEGATIVE mg/dL
Hgb urine dipstick: NEGATIVE
Ketones, ur: NEGATIVE mg/dL
Leukocytes,Ua: NEGATIVE
Nitrite: NEGATIVE
Protein, ur: NEGATIVE mg/dL
Specific Gravity, Urine: 1.003 — ABNORMAL LOW (ref 1.005–1.030)
pH: 6 (ref 5.0–8.0)

## 2023-09-11 LAB — COMPREHENSIVE METABOLIC PANEL
ALT: 20 U/L (ref 0–44)
AST: 32 U/L (ref 15–41)
Albumin: 4.6 g/dL (ref 3.5–5.0)
Alkaline Phosphatase: 66 U/L (ref 38–126)
Anion gap: 10 (ref 5–15)
BUN: 19 mg/dL (ref 8–23)
CO2: 26 mmol/L (ref 22–32)
Calcium: 9.7 mg/dL (ref 8.9–10.3)
Chloride: 104 mmol/L (ref 98–111)
Creatinine, Ser: 0.8 mg/dL (ref 0.44–1.00)
GFR, Estimated: >60 mL/min (ref >60)
Glucose, Bld: 95 mg/dL (ref 70–99)
Potassium: 3.2 mmol/L — ABNORMAL LOW (ref 3.5–5.1)
Sodium: 140 mmol/L (ref 135–145)
Total Bilirubin: 1.7 mg/dL — ABNORMAL HIGH (ref <1.2)
Total Protein: 7.1 g/dL (ref 6.5–8.1)

## 2023-09-11 LAB — PROTIME-INR
INR: 1 (ref 0.8–1.2)
Prothrombin Time: 13.2 s (ref 11.4–15.2)

## 2023-09-11 LAB — TYPE AND SCREEN
ABO/RH(D): O POS
Antibody Screen: NEGATIVE

## 2023-09-11 LAB — SARS CORONAVIRUS 2 (TAT 6-24 HRS): SARS Coronavirus 2: NEGATIVE

## 2023-09-11 NOTE — Progress Notes (Signed)
 Patient signed all consents at PAT lab appointment. CHG soap and instructions were given to patient. CHG surgical prep reviewed with patient and all questions answered.  Patients chart send to anesthesia for review. Pt denies any respiratory illness/infection in the last two months.

## 2023-09-14 MED ORDER — NOREPINEPHRINE 4 MG/250ML-% IV SOLN
0.0000 ug/min | INTRAVENOUS | Status: DC
  Filled 2023-09-14: qty 250

## 2023-09-14 MED ORDER — HEPARIN 30,000 UNITS/1000 ML (OHS) CELLSAVER SOLUTION
Status: DC
  Filled 2023-09-14 (×2): qty 1000

## 2023-09-14 MED ORDER — CEFAZOLIN SODIUM-DEXTROSE 2-4 GM/100ML-% IV SOLN
2.0000 g | INTRAVENOUS | Status: AC
  Administered 2023-09-15: 2 g via INTRAVENOUS
  Filled 2023-09-14: qty 100

## 2023-09-14 MED ORDER — DEXMEDETOMIDINE HCL IN NACL 400 MCG/100ML IV SOLN
0.1000 ug/kg/h | INTRAVENOUS | Status: AC
  Administered 2023-09-15: 58 ug via INTRAVENOUS
  Administered 2023-09-15: 1 ug/kg/h via INTRAVENOUS
  Filled 2023-09-14: qty 100

## 2023-09-14 MED ORDER — POTASSIUM CHLORIDE 2 MEQ/ML IV SOLN
80.0000 meq | INTRAVENOUS | Status: DC
  Filled 2023-09-14 (×2): qty 40

## 2023-09-14 MED ORDER — MAGNESIUM SULFATE 50 % IJ SOLN
40.0000 meq | INTRAMUSCULAR | Status: DC
  Filled 2023-09-14 (×2): qty 9.85

## 2023-09-14 NOTE — H&P (Signed)
[address]       [place] [address]             [phone]                                   [facility] Medical Record #[id number] Date of Birth: September 21, 1941   Ignatius Specking, MD Ignatius Specking, MD   Chief Complaint:   Increasing DOE   History of Present Illness:     Pt is a very pleasant 82 yo female who has known heart murmur for many years and followed for AS. Pt recently had echo with increasing gradient to where now it is a mean gradient of and with a valve area of 0.7cm2. Pt with preserved LV function. Due to her worsening gradient and now feeling over the past few weeks increasing DOE with activity it was felt that AVR was indicated. She has no lightheadedness nor CP. She has a history of adenocarcinoma of the lung sp lobectomy and has been cancer free. She has also hemolytic anemia. Her work up with cath without CAD and CTA with anatomy favoring Evolute valve 26mm.              Past Medical History:  Diagnosis Date   Anemia     Anxiety     Essential hypertension, benign since the 1900's   Family history of adverse reaction to anesthesia      Children - N/V   History of blood transfusion     History of lung cancer      Lung adenocarcinoma s/p resection 2018   Hyperlipidemia     Other osteoporosis     Other thalassemia (HCC)     Severe aortic stenosis                 Past Surgical History:  Procedure Laterality Date   BIOPSY   12/28/2020    Procedure: BIOPSY;  Surgeon: Dolores Frame, MD;  [facility]: [facility];  Service: Gastroenterology;;   COLONOSCOPY       COLONOSCOPY WITH PROPOFOL N/A 12/28/2020    Procedure: COLONOSCOPY WITH PROPOFOL;  Surgeon: Dolores Frame, MD;  [facility];  Service: Gastroenterology;  Laterality: N/A;  AM   ESOPHAGOGASTRODUODENOSCOPY (EGD) WITH PROPOFOL N/A 12/28/2020    Procedure: ESOPHAGOGASTRODUODENOSCOPY (EGD) WITH PROPOFOL;  Surgeon:  Dolores Frame, MD;  Location: [facility];  Service: Gastroenterology;  Laterality: N/A;   INGUINAL HERNIA REPAIR Left     LUMBAR DISC SURGERY   1990's    for severe pain and sciatica   PARTIAL HYSTERECTOMY   1970's    for bleeding and prolapse   RIGHT/LEFT HEART CATH AND CORONARY ANGIOGRAPHY N/A 08/06/2023    Procedure: RIGHT/LEFT HEART CATH AND CORONARY ANGIOGRAPHY;  Surgeon: Kathleene Hazel, MD;  [facility];  Service: Cardiovascular;  Laterality: N/A;   VIDEO ASSISTED THORACOSCOPY (VATS)/ LOBECTOMY Right 04/20/2017    Procedure: VIDEO ASSISTED THORACOSCOPY (VATS)/RIGHT UPPER LOBECTOMY;  Surgeon: Delight Ovens, MD;  Location: [facility] OR;  Service: Thoracic;  Laterality: Right;   VIDEO BRONCHOSCOPY N/A 04/20/2017    Procedure: VIDEO BRONCHOSCOPY;  Surgeon: Delight Ovens, MD;  Location: [facility] OR;  Service: Thoracic;  Laterality: N/A;          Tobacco Use History  Social History  Tobacco Use  Smoking Status Never  Smokeless Tobacco Never      Social History       Substance and Sexual Activity  Alcohol Use No      Social History         Socioeconomic History   Marital status: Widowed      Spouse name: Not on file   Number of children: Not on file   Years of education: Not on file   Highest education level: Not on file  Occupational History   Occupation: retired  Tobacco Use   Smoking status: Never   Smokeless tobacco: Never  Substance and Sexual Activity   Alcohol use: No   Drug use: No   Sexual activity: Never  Other Topics Concern   Not on file  Social History Narrative    Mrs. Lederman is married. Her husband has been chronically ill for 6 years (2011). He has metastatic prostate cancer and multiple myeloma. 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 She has a lot of family support.     Social Determinants of Health        Financial Resource Strain: Low Risk  (11/27/2021)    Received from [facility],  [facility]    Overall Financial Resource Strain (CARDIA)     Difficulty of Paying Living Expenses: Not hard at all  Food Insecurity: No Food Insecurity (11/27/2021)    Received from [facility], [facility]    Hunger Vital Sign     Worried About Running Out of Food in the Last Year: Never true     Ran Out of Food in the Last Year: Never true  Transportation Needs: No Transportation Needs (11/27/2021)    Received from [facility], [facility]    [facility] - Transportation     Lack of Transportation (Medical): No     Lack of Transportation (Non-Medical): No  Physical Activity: Inactive (11/27/2021)    Received from [facility], [facility]    Exercise Vital Sign     Days of Exercise per Week: 0 days     Minutes of Exercise per Session: 0 min  Stress: No Stress Concern Present (11/27/2021)    Received from [facility], [facility] of Occupational Health - Occupational Stress Questionnaire     Feeling of Stress : Not at all  Social Connections: Moderately Isolated (11/27/2021)    Received from [facility], [facility]    Social Connection and Isolation Panel [NHANES]     Frequency of Communication with Friends and Family: More than three times a week     Frequency of Social Gatherings with Friends and Family: More than three times a week     Attends Religious Services: More than 4 times per year     Active Member of [employer] or Organizations: No     Attends Banker Meetings: Never     Marital Status: Widowed  Intimate Partner Violence: Not At Risk (11/27/2021)    Received from [facility], [facility]    Humiliation, Afraid, Rape, and Kick questionnaire     Fear of Current or Ex-Partner: No     Emotionally Abused: No     Physically Abused: No     Sexually Abused: No      Allergies      Allergies  Allergen Reactions   Lisinopril Cough  Current Outpatient Medications  Medication Sig  Dispense Refill   acetaminophen (TYLENOL) 500 MG tablet Take 1 tablet (500 mg total) by mouth every 6 (six) hours as needed. (Patient taking differently: Take 500-1,000 mg by mouth every 6 (six) hours as needed for moderate pain (pain score 4-6).) 30 tablet 0   Alpha-D-Galactosidase (BEANO PO) Take 2 capsules by mouth daily as needed (bloating).       amLODipine (NORVASC) 10 MG tablet Take 10 mg by mouth daily.       Ascorbic Acid (VITAMIN C) 500 MG CHEW Chew 1,000 mg by mouth daily.       aspirin EC 81 MG tablet Take 81 mg by mouth daily.       Calcium Carb-Cholecalciferol (CALCIUM 500 + D PO) Take 1 tablet by mouth 2 (two) times daily.       cyanocobalamin (VITAMIN B12) 1000 MCG tablet Take 1,000 mcg by mouth daily.       fexofenadine (ALLEGRA) 180 MG tablet Take 1 tablet (180 mg total) by mouth daily as needed for allergies. (Patient taking differently: Take 180 mg by mouth daily.) 90 tablet 3   folic acid (FOLVITE) 1 MG tablet Take 2 mg by mouth daily.       furosemide (LASIX) 20 MG tablet Take 20 mg by mouth daily as needed (swelling).       guaifenesin (HUMIBID E) 400 MG TABS tablet Take 400 mg by mouth daily.       ibuprofen (ADVIL) 200 MG tablet Take 200 mg by mouth every 6 (six) hours as needed for moderate pain.       ipratropium (ATROVENT) 0.03 % nasal spray Place 1 spray into both nostrils 3 (three) times daily as needed for rhinitis. (Patient taking differently: Place 1 spray into both nostrils daily.) 90 mL 3   losartan (COZAAR) 25 MG tablet Take 0.5 tablets (12.5 mg total) by mouth daily. 45 tablet 3   meclizine (ANTIVERT) 12.5 MG tablet Take 12.5 mg by mouth every 6 (six) hours as needed for dizziness.       metoprolol tartrate (LOPRESSOR) 50 MG tablet Take 1 tablet (50 mg total) by mouth as directed for 1 dose. 2 hours prior to CT scan to slow heart rate down 1 tablet 0   Multiple Vitamin (MULTIVITAMIN WITH MINERALS) TABS tablet Take 2 tablets by mouth daily.       potassium  chloride SA (KLOR-CON M) 20 MEQ tablet Take 1 tablet (20 mEq total) by mouth as needed (when taking the lasix for swelling). 90 tablet 3   predniSONE (DELTASONE) 5 MG tablet Take 5 mg by mouth daily with breakfast.       rosuvastatin (CRESTOR) 5 MG tablet TAKE ONE TABLET BY MOUTH EVERY MORNING 90 tablet 2   simethicone (MYLICON) 80 MG chewable tablet Chew 160 mg by mouth every 6 (six) hours as needed for flatulence.       Zinc 30 MG CAPS Take 60 mg by mouth daily.          No current facility-administered medications for this visit.               Family History  Problem Relation Age of Onset   Hypertension Mother     Asthma Sister     Heart attack Sister 12                Physical Exam: LMP  (LMP Unknown)  Teeth in good repair Lungs: clear Card: RR with  3/6 sem Ext: warm no edema Neuro: alert and no focal deficits     Diagnostic Studies & Laboratory data: I have personally reviewed the following studies and agree with the findings   TTE (07/2023) IMPRESSIONS     1. Left ventricular ejection fraction, by estimation, is 70 to 75%. The  left ventricle has hyperdynamic function. Left ventricular endocardial  border not optimally defined to evaluate regional wall motion. There is  mild left ventricular hypertrophy.  Left ventricular diastolic parameters are consistent with Grade I  diastolic dysfunction (impaired relaxation). Elevated left atrial  pressure.   2. Right ventricular systolic function is normal. The right ventricular  size is normal. Tricuspid regurgitation signal is inadequate for assessing  PA pressure.   3. The mitral valve is normal in structure. No evidence of mitral valve  regurgitation. No evidence of mitral stenosis.   4. The aortic valve has an indeterminant number of cusps. There is  moderate calcification of the aortic valve. There is moderate thickening  of the aortic valve. Aortic valve regurgitation is mild. Severe aortic  valve stenosis.  Severe aortic stenosis is  present. Aortic valve mean gradient measures 40.0 mmHg. Aortic valve peak  gradient measures 64.6 mmHg. Aortic valve area, by VTI measures 0.70 cm.      DI 0.25   5. The inferior vena cava is normal in size with greater than 50%  respiratory variability, suggesting right atrial pressure of 3 mmHg.   FINDINGS   Left Ventricle: Left ventricular ejection fraction, by estimation, is 70  to 75%. The left ventricle has hyperdynamic function. Left ventricular  endocardial border not optimally defined to evaluate regional wall motion.  The left ventricular internal  cavity size was normal in size. There is mild left ventricular  hypertrophy. Left ventricular diastolic parameters are consistent with  Grade I diastolic dysfunction (impaired relaxation). Elevated left atrial  pressure.   Right Ventricle: The right ventricular size is normal. Right vetricular  wall thickness was not well visualized. Right ventricular systolic  function is normal. Tricuspid regurgitation signal is inadequate for  assessing PA pressure.   Left Atrium: Left atrial size was normal in size.   Right Atrium: Right atrial size was normal in size.   Pericardium: There is no evidence of pericardial effusion.   Mitral Valve: The mitral valve is normal in structure. No evidence of  mitral valve regurgitation. No evidence of mitral valve stenosis.   Tricuspid Valve: The tricuspid valve is normal in structure. Tricuspid  valve regurgitation is trivial. No evidence of tricuspid stenosis.   Aortic Valve: The aortic valve has an indeterminant number of cusps. There  is moderate calcification of the aortic valve. There is moderate  thickening of the aortic valve. There is moderate aortic valve annular  calcification. Aortic valve regurgitation  is mild. Aortic regurgitation PHT measures 420 msec. Severe aortic  stenosis is present. Aortic valve mean gradient measures 40.0 mmHg. Aortic  valve  peak gradient measures 64.6 mmHg. Aortic valve area, by VTI measures  0.70 cm.   Pulmonic Valve: The pulmonic valve was not well visualized. Pulmonic valve  regurgitation is not visualized. No evidence of pulmonic stenosis.   Aorta: The aortic root is normal in size and structure.   Venous: The inferior vena cava is normal in size with greater than 50%  respiratory variability, suggesting right atrial pressure of 3 mmHg.   IAS/Shunts: No atrial level shunt detected by color flow Doppler.     LEFT VENTRICLE  PLAX 2D  LVIDd:         4.00 cm   Diastology  LVIDs:         2.10 cm   LV e' medial:    5.33 cm/s  LV PW:         1.10 cm   LV E/e' medial:  15.8  LV IVS:        1.20 cm   LV e' lateral:   5.11 cm/s  LVOT diam:     1.90 cm   LV E/e' lateral: 16.4  LV SV:         73  LV SV Index:   48  LVOT Area:     2.84 cm     RIGHT VENTRICLE  RV S prime:     10.70 cm/s  TAPSE (M-mode): 2.1 cm   LEFT ATRIUM             Index        RIGHT ATRIUM           Index  LA diam:        3.20 cm 2.09 cm/m   RA Area:     10.40 cm  LA Vol (A2C):   61.6 ml 40.30 ml/m  RA Volume:   20.30 ml  13.28 ml/m  LA Vol (A4C):   40.3 ml 26.37 ml/m  LA Biplane Vol: 49.7 ml 32.52 ml/m   AORTIC VALVE  AV Area (Vmax):    0.68 cm  AV Area (Vmean):   0.65 cm  AV Area (VTI):     0.70 cm  AV Vmax:           402.00 cm/s  AV Vmean:          298.667 cm/s  AV VTI:            1.040 m  AV Peak Grad:      64.6 mmHg  AV Mean Grad:      40.0 mmHg  LVOT Vmax:         96.85 cm/s  LVOT Vmean:        68.850 cm/s  LVOT VTI:          0.258 m  LVOT/AV VTI ratio: 0.25  AI PHT:            420 msec    AORTA  Ao Root diam: 3.10 cm   MITRAL VALVE  MV Area (PHT): 4.80 cm    SHUNTS  MV Decel Time: 158 msec    Systemic VTI:  0.26 m  MV E velocity: 84.00 cm/s  Systemic Diam: 1.90 cm  MV A velocity: 98.50 cm/s  MV E/A ratio:  0.85    CATH (07/2023) Conclusion   No angiographic evidence of CAD Normal right and  left heart pressures (RA 5, RV 33/4/7, PA 36/12 mean 21, PCWP 10, LV 171/11/13) Peak to peak gradient 38 mmHg.    Recent Radiology Findings:   CTA (07/2023) FINDINGS: Image quality: Excellent.   Noise artifact is: Limited.   Valve Morphology: Tricuspid aortic valve with severe leaflet thickening. Moderate leaflet calcifications. Restricted leaflet movement in systole.   Aortic Valve Calcium score: 653   Aortic annular dimension:   Phase assessed: 25%   Annular area: 340 mm2   Annular perimeter: 66.3 mm   Max diameter: 22.9 mm   Min diameter: 18.9 mm   Annular and subannular calcification: None.   Membranous septum length: 6.6 mm   Optimal coplanar projection: LAO  6 CAU 11   Coronary Artery Height above Annulus:   Left Main: 14.4 mm   Right Coronary: 17.1 mm   Sinus of Valsalva Measurements:   Non-coronary: 27.8 mm   Right-coronary: 27.9 mm   Left-coronary: 28.6 mm   Sinus of Valsalva Height:   Non-coronary: 21.9 mm   Right-coronary: 20.0 mm   Left-coronary: 18.7 mm   Sinotubular Junction: 24 mm   Ascending Thoracic Aorta: 30 mm   Coronary Arteries: Normal coronary origin. Left dominance. The study was performed without use of NTG and is insufficient for plaque evaluation. Please refer to recent cardiac catheterization for coronary assessment.   Cardiac Morphology:   Right Atrium: Right atrial size is within normal limits.   Right Ventricle: The right ventricular cavity is within normal limits.   Left Atrium: Left atrial size is normal in size with no left atrial appendage filling defect.   Left Ventricle: The ventricular cavity size is within normal limits.   Pulmonary arteries: Normal in size without proximal filling defect.   Pulmonary veins: Normal pulmonary venous drainage.   Pericardium: Normal thickness with no significant effusion or calcium present.   Mitral Valve: The mitral valve is normal structure without significant  calcification.   Extra-cardiac findings: See attached radiology report for non-cardiac structures.   IMPRESSION: 1. Annular measurements are in between a 20/23 mm S3. Findings do support a 26 mm Evolut Pro.   2. No significant annular or subannular calcifications.   3. Sufficient coronary to annulus distance.   4. Optimal Fluoroscopic Angle for Delivery: LAO 6 CAU 11       Recent Lab Findings: Recent Labs       Lab Results  Component Value Date    WBC 10.1 08/03/2023    HGB 11.2 (L) 08/06/2023    HCT 33.0 (L) 08/06/2023    PLT 269 08/03/2023    GLUCOSE 86 08/03/2023    CHOL 168 02/18/2018    TRIG 50 02/18/2018    HDL 90 02/18/2018    LDLDIRECT 137 (H) 11/13/2017    LDLCALC 68 02/18/2018    ALT 18 06/12/2023    AST 28 06/12/2023    NA 145 08/06/2023    K 3.3 (L) 08/06/2023    CL 104 08/03/2023    CREATININE 0.76 08/03/2023    BUN 16 08/03/2023    CO2 25 08/03/2023    INR 0.96 04/16/2017            Assessment / Plan:     82 yo female with NYHA class 2 symptoms of severe AS with normal LV function and no CAD. Pt has a class I indication for AVR and secondary to her age and comorbidities of previous lung surgery due to adenocarcinoma and hemolytic anemia would best be served with TAVR. Pt would be a surgical bail out if needed. Will have femoral access with Evolute 26mm valve. All the risks, goals and recovery of surgery were discussed and pt understands and her and her son wish to proceed

## 2023-09-15 ENCOUNTER — Inpatient Hospital Stay (HOSPITAL_COMMUNITY): Payer: Medicare HMO | Admitting: Vascular Surgery

## 2023-09-15 ENCOUNTER — Inpatient Hospital Stay (HOSPITAL_COMMUNITY): Payer: Self-pay

## 2023-09-15 ENCOUNTER — Inpatient Hospital Stay (HOSPITAL_COMMUNITY): Payer: Medicare HMO

## 2023-09-15 ENCOUNTER — Other Ambulatory Visit: Payer: Self-pay

## 2023-09-15 ENCOUNTER — Encounter (HOSPITAL_COMMUNITY): Payer: Self-pay | Admitting: Internal Medicine

## 2023-09-15 ENCOUNTER — Other Ambulatory Visit: Payer: Self-pay | Admitting: Physician Assistant

## 2023-09-15 ENCOUNTER — Inpatient Hospital Stay (HOSPITAL_COMMUNITY)
Admission: RE | Admit: 2023-09-15 | Discharge: 2023-09-16 | DRG: 266 | Disposition: A | Discharge status: Home / Self Care | Payer: Medicare HMO | Attending: Internal Medicine | Admitting: Internal Medicine

## 2023-09-15 ENCOUNTER — Encounter (HOSPITAL_COMMUNITY)
Admission: RE | Disposition: A | Discharge status: Home / Self Care | Payer: Self-pay | Source: Home / Self Care | Attending: Thoracic Surgery (Cardiothoracic Vascular Surgery)

## 2023-09-15 DIAGNOSIS — Z880 Allergy status to penicillin: Secondary | ICD-10-CM

## 2023-09-15 DIAGNOSIS — K219 Gastro-esophageal reflux disease without esophagitis: Secondary | ICD-10-CM | POA: Diagnosis not present

## 2023-09-15 DIAGNOSIS — I350 Nonrheumatic aortic (valve) stenosis: Secondary | ICD-10-CM

## 2023-09-15 DIAGNOSIS — D563 Thalassemia minor: Secondary | ICD-10-CM | POA: Diagnosis present

## 2023-09-15 DIAGNOSIS — Z902 Acquired absence of lung [part of]: Secondary | ICD-10-CM

## 2023-09-15 DIAGNOSIS — E785 Hyperlipidemia, unspecified: Secondary | ICD-10-CM | POA: Diagnosis not present

## 2023-09-15 DIAGNOSIS — Z79899 Other long term (current) drug therapy: Secondary | ICD-10-CM | POA: Diagnosis not present

## 2023-09-15 DIAGNOSIS — Z825 Family history of asthma and other chronic lower respiratory diseases: Secondary | ICD-10-CM

## 2023-09-15 DIAGNOSIS — Z888 Allergy status to other drugs, medicaments and biological substances status: Secondary | ICD-10-CM

## 2023-09-15 DIAGNOSIS — Z90711 Acquired absence of uterus with remaining cervical stump: Secondary | ICD-10-CM

## 2023-09-15 DIAGNOSIS — I10 Essential (primary) hypertension: Secondary | ICD-10-CM | POA: Diagnosis present

## 2023-09-15 DIAGNOSIS — Z952 Presence of prosthetic heart valve: Secondary | ICD-10-CM

## 2023-09-15 DIAGNOSIS — Z006 Encounter for examination for normal comparison and control in clinical research program: Secondary | ICD-10-CM | POA: Diagnosis not present

## 2023-09-15 DIAGNOSIS — D591 Autoimmune hemolytic anemia, unspecified: Secondary | ICD-10-CM | POA: Diagnosis not present

## 2023-09-15 DIAGNOSIS — Z85118 Personal history of other malignant neoplasm of bronchus and lung: Secondary | ICD-10-CM | POA: Diagnosis not present

## 2023-09-15 DIAGNOSIS — Z8249 Family history of ischemic heart disease and other diseases of the circulatory system: Secondary | ICD-10-CM | POA: Diagnosis not present

## 2023-09-15 DIAGNOSIS — I351 Nonrheumatic aortic (valve) insufficiency: Secondary | ICD-10-CM | POA: Diagnosis not present

## 2023-09-15 DIAGNOSIS — Z7982 Long term (current) use of aspirin: Secondary | ICD-10-CM | POA: Diagnosis not present

## 2023-09-15 DIAGNOSIS — M810 Age-related osteoporosis without current pathological fracture: Secondary | ICD-10-CM | POA: Diagnosis present

## 2023-09-15 DIAGNOSIS — D568 Other thalassemias: Secondary | ICD-10-CM | POA: Diagnosis present

## 2023-09-15 DIAGNOSIS — I5033 Acute on chronic diastolic (congestive) heart failure: Secondary | ICD-10-CM | POA: Diagnosis not present

## 2023-09-15 DIAGNOSIS — Z7952 Long term (current) use of systemic steroids: Secondary | ICD-10-CM

## 2023-09-15 DIAGNOSIS — I110 Hypertensive heart disease with heart failure: Secondary | ICD-10-CM | POA: Diagnosis not present

## 2023-09-15 DIAGNOSIS — C3411 Malignant neoplasm of upper lobe, right bronchus or lung: Secondary | ICD-10-CM | POA: Diagnosis present

## 2023-09-15 HISTORY — PX: INTRAOPERATIVE TRANSTHORACIC ECHOCARDIOGRAM: SHX6523

## 2023-09-15 HISTORY — DX: Presence of prosthetic heart valve: Z95.2

## 2023-09-15 LAB — ECHOCARDIOGRAM LIMITED
AR max vel: 4.59 cm2
AV Area VTI: 4.06 cm2
AV Area mean vel: 4.64 cm2
AV Mean grad: 5 mm[Hg]
AV Peak grad: 10.6 mm[Hg]
Ao pk vel: 1.63 m/s
Calc EF: 65.9 %
Single Plane A2C EF: 65.7 %
Single Plane A4C EF: 66.4 %

## 2023-09-15 LAB — POCT I-STAT, CHEM 8
BUN: 15 mg/dL (ref 8–23)
Calcium, Ion: 1.24 mmol/L (ref 1.15–1.40)
Chloride: 104 mmol/L (ref 98–111)
Creatinine, Ser: 0.6 mg/dL (ref 0.44–1.00)
Glucose, Bld: 112 mg/dL — ABNORMAL HIGH (ref 70–99)
HCT: 28 % — ABNORMAL LOW (ref 36.0–46.0)
Hemoglobin: 9.5 g/dL — ABNORMAL LOW (ref 12.0–15.0)
Potassium: 3.6 mmol/L (ref 3.5–5.1)
Sodium: 141 mmol/L (ref 135–145)
TCO2: 24 mmol/L (ref 22–32)

## 2023-09-15 LAB — BASIC METABOLIC PANEL
Anion gap: 9 (ref 5–15)
BUN: 18 mg/dL (ref 8–23)
CO2: 23 mmol/L (ref 22–32)
Calcium: 9.1 mg/dL (ref 8.9–10.3)
Chloride: 105 mmol/L (ref 98–111)
Creatinine, Ser: 0.65 mg/dL (ref 0.44–1.00)
GFR, Estimated: >60 mL/min (ref >60)
Glucose, Bld: 77 mg/dL (ref 70–99)
Potassium: 3.3 mmol/L — ABNORMAL LOW (ref 3.5–5.1)
Sodium: 137 mmol/L (ref 135–145)

## 2023-09-15 LAB — CBC
HCT: 30 % — ABNORMAL LOW (ref 36.0–46.0)
Hemoglobin: 10.9 g/dL — ABNORMAL LOW (ref 12.0–15.0)
MCH: 26.1 pg (ref 26.0–34.0)
MCHC: 36.3 g/dL — ABNORMAL HIGH (ref 30.0–36.0)
MCV: 71.8 fL — ABNORMAL LOW (ref 80.0–100.0)
Platelets: 216 10*3/uL (ref 150–400)
RBC: 4.18 MIL/uL (ref 3.87–5.11)
RDW: 20.1 % — ABNORMAL HIGH (ref 11.5–15.5)
WBC: 8.1 10*3/uL (ref 4.0–10.5)
nRBC: 0.5 % — ABNORMAL HIGH (ref 0.0–0.2)

## 2023-09-15 SURGERY — TRANSCATHETER AORTIC VALVE REPLACEMENT, TRANSFEMORAL (CATHLAB)
Anesthesia: Monitor Anesthesia Care

## 2023-09-15 MED ORDER — CHLORHEXIDINE GLUCONATE 4 % EX SOLN
60.0000 mL | Freq: Once | CUTANEOUS | Status: DC
Start: 2023-09-15 — End: 2023-09-15

## 2023-09-15 MED ORDER — ONDANSETRON HCL 4 MG/2ML IJ SOLN: 4.0000 mg | Freq: Four times a day (QID) | INTRAMUSCULAR | Status: DC | PRN

## 2023-09-15 MED ORDER — SODIUM CHLORIDE 0.9 % IV SOLN: INTRAVENOUS | Status: DC

## 2023-09-15 MED ORDER — FOLIC ACID 1 MG PO TABS
2.0000 mg | ORAL_TABLET | Freq: Every day | ORAL | Status: DC
  Administered 2023-09-16: 2 mg via ORAL
  Filled 2023-09-15: qty 2

## 2023-09-15 MED ORDER — LIDOCAINE HCL (PF) 1 % IJ SOLN
INTRAMUSCULAR | Status: AC
  Filled 2023-09-15: qty 30

## 2023-09-15 MED ORDER — PROPOFOL 500 MG/50ML IV EMUL
INTRAVENOUS | Status: DC | PRN
  Administered 2023-09-15 (×2): 20 mg via INTRAVENOUS
  Administered 2023-09-15 (×3): 10 mg via INTRAVENOUS
  Administered 2023-09-15: 10 ug/kg/min via INTRAVENOUS

## 2023-09-15 MED ORDER — NOREPINEPHRINE BITARTRATE 1 MG/ML IV SOLN
INTRAVENOUS | Status: DC | PRN
  Administered 2023-09-15: 1 mL via INTRAVENOUS
  Administered 2023-09-15 (×2): 2 mL via INTRAVENOUS

## 2023-09-15 MED ORDER — MORPHINE SULFATE (PF) 2 MG/ML IV SOLN: 1.0000 mg | INTRAVENOUS | Status: DC | PRN

## 2023-09-15 MED ORDER — CLEVIDIPINE BUTYRATE 0.5 MG/ML IV EMUL
INTRAVENOUS | Status: DC | PRN
  Administered 2023-09-15: 1 mg/h via INTRAVENOUS

## 2023-09-15 MED ORDER — SODIUM CHLORIDE 0.9 % IV SOLN: 250.0000 mL | INTRAVENOUS | Status: DC | PRN

## 2023-09-15 MED ORDER — HEPARIN SODIUM (PORCINE) 1000 UNIT/ML IJ SOLN
INTRAMUSCULAR | Status: DC | PRN
Start: 2023-09-15 — End: 2023-09-15
  Administered 2023-09-15: 9000 [IU] via INTRAVENOUS

## 2023-09-15 MED ORDER — CEFAZOLIN SODIUM-DEXTROSE 2-4 GM/100ML-% IV SOLN
2.0000 g | Freq: Three times a day (TID) | INTRAVENOUS | Status: AC
  Administered 2023-09-15 – 2023-09-16 (×2): 2 g via INTRAVENOUS
  Filled 2023-09-15 (×2): qty 100

## 2023-09-15 MED ORDER — CHLORHEXIDINE GLUCONATE 0.12 % MT SOLN: 15.0000 mL | Freq: Once | OROMUCOSAL | Status: DC

## 2023-09-15 MED ORDER — SODIUM CHLORIDE 0.9% FLUSH
250.0000 mL | Freq: Once | INTRAVENOUS | Status: AC
  Administered 2023-09-15: 250 mL via INTRAVENOUS

## 2023-09-15 MED ORDER — OXYCODONE HCL 5 MG PO TABS: 5.0000 mg | ORAL_TABLET | ORAL | Status: DC | PRN

## 2023-09-15 MED ORDER — ROSUVASTATIN CALCIUM 5 MG PO TABS
5.0000 mg | ORAL_TABLET | Freq: Every morning | ORAL | Status: DC
  Administered 2023-09-16: 5 mg via ORAL
  Filled 2023-09-15: qty 1

## 2023-09-15 MED ORDER — HEPARIN (PORCINE) IN NACL 1000-0.9 UT/500ML-% IV SOLN
INTRAVENOUS | Status: DC | PRN
  Administered 2023-09-15 (×3): 500 mL

## 2023-09-15 MED ORDER — IOHEXOL 350 MG/ML SOLN
INTRAVENOUS | Status: DC | PRN
  Administered 2023-09-15: 90 mL

## 2023-09-15 MED ORDER — SODIUM CHLORIDE 0.9% FLUSH: 3.0000 mL | INTRAVENOUS | Status: DC | PRN

## 2023-09-15 MED ORDER — ACETAMINOPHEN 325 MG PO TABS
650.0000 mg | ORAL_TABLET | Freq: Four times a day (QID) | ORAL | Status: DC | PRN
  Administered 2023-09-15: 650 mg via ORAL
  Filled 2023-09-15: qty 2

## 2023-09-15 MED ORDER — AMLODIPINE BESYLATE 5 MG PO TABS
10.0000 mg | ORAL_TABLET | Freq: Every day | ORAL | Status: DC
  Administered 2023-09-16: 10 mg via ORAL
  Filled 2023-09-15: qty 2

## 2023-09-15 MED ORDER — SIMETHICONE 80 MG PO CHEW: 160.0000 mg | CHEWABLE_TABLET | Freq: Four times a day (QID) | ORAL | Status: DC | PRN

## 2023-09-15 MED ORDER — ASPIRIN 81 MG PO TBEC
81.0000 mg | DELAYED_RELEASE_TABLET | Freq: Every day | ORAL | Status: DC
  Administered 2023-09-16: 81 mg via ORAL
  Filled 2023-09-15: qty 1

## 2023-09-15 MED ORDER — CHLORHEXIDINE GLUCONATE 4 % EX SOLN: 60.0000 mL | Freq: Once | CUTANEOUS | Status: DC

## 2023-09-15 MED ORDER — CHLORHEXIDINE GLUCONATE 4 % EX SOLN
30.0000 mL | CUTANEOUS | Status: DC
Start: 2023-09-15 — End: 2023-09-15

## 2023-09-15 MED ORDER — ACETAMINOPHEN 650 MG RE SUPP: 650.0000 mg | Freq: Four times a day (QID) | RECTAL | Status: DC | PRN

## 2023-09-15 MED ORDER — SODIUM CHLORIDE 0.9 % IV SOLN: INTRAVENOUS | Status: AC

## 2023-09-15 MED ORDER — TRAMADOL HCL 50 MG PO TABS: 50.0000 mg | ORAL_TABLET | ORAL | Status: DC | PRN

## 2023-09-15 MED ORDER — CHLORHEXIDINE GLUCONATE 0.12 % MT SOLN
15.0000 mL | Freq: Once | OROMUCOSAL | Status: AC
  Administered 2023-09-15: 15 mL via OROMUCOSAL
  Filled 2023-09-15: qty 15

## 2023-09-15 MED ORDER — LIDOCAINE HCL (PF) 1 % IJ SOLN
INTRAMUSCULAR | Status: DC | PRN
  Administered 2023-09-15: 15 mL
  Administered 2023-09-15: 10 mL

## 2023-09-15 MED ORDER — LACTATED RINGERS IV SOLN: INTRAVENOUS | Status: DC | PRN

## 2023-09-15 MED ORDER — SODIUM CHLORIDE 0.9% FLUSH
3.0000 mL | Freq: Two times a day (BID) | INTRAVENOUS | Status: DC
Start: 2023-09-16 — End: 2023-09-16
  Administered 2023-09-15 – 2023-09-16 (×2): 3 mL via INTRAVENOUS

## 2023-09-15 MED ORDER — PROTAMINE SULFATE 10 MG/ML IV SOLN
INTRAVENOUS | Status: DC | PRN
Start: 2023-09-15 — End: 2023-09-15
  Administered 2023-09-15: 10 mg via INTRAVENOUS
  Administered 2023-09-15: 80 mg via INTRAVENOUS

## 2023-09-15 MED ORDER — PREDNISONE 5 MG PO TABS
5.0000 mg | ORAL_TABLET | Freq: Every day | ORAL | Status: DC
  Administered 2023-09-16: 5 mg via ORAL
  Filled 2023-09-15: qty 1

## 2023-09-15 MED ORDER — NITROGLYCERIN IN D5W 200-5 MCG/ML-% IV SOLN
0.0000 ug/min | INTRAVENOUS | Status: DC
  Filled 2023-09-15: qty 250

## 2023-09-15 SURGICAL SUPPLY — 29 items
BAG SNAP BAND KOVER 36X36 (MISCELLANEOUS) ×2 IMPLANT
BALLN TRUE 18X4.5 (BALLOONS) ×1
BALLOON TRUE 18X4.5 (BALLOONS) IMPLANT
CABLE SURGICAL S-101-97-12 (CABLE) IMPLANT
CATH INFINITI 5 FR STR PIGTAIL (CATHETERS) IMPLANT
CATH INFINITI 5FR ANG PIGTAIL (CATHETERS) IMPLANT
CATH INFINITI 6F AL1 (CATHETERS) IMPLANT
CLOSURE MYNX CONTROL 6F/7F (Vascular Products) IMPLANT
CLOSURE PERCLOSE PROSTYLE (VASCULAR PRODUCTS) IMPLANT
INTRODUCER PERFORM 14 30 .038 (SHEATH) IMPLANT
PACK CARDIAC CATHETERIZATION (CUSTOM PROCEDURE TRAY) ×1 IMPLANT
SET ATX-X65L (MISCELLANEOUS) IMPLANT
SHEATH DRYSEAL FLEX 18FR 33CM (SHEATH) IMPLANT
SHEATH PINNACLE 6F 10CM (SHEATH) IMPLANT
SHEATH PINNACLE 8F 10CM (SHEATH) IMPLANT
SHEATH PROBE COVER 6X72 (BAG) IMPLANT
STOPCOCK MORSE 400PSI 3WAY (MISCELLANEOUS) ×2 IMPLANT
SYS EVOLUT FX DELIVERY 23-29 (CATHETERS) ×1
SYS EVOLUT FX LOADING 23-29 (CATHETERS) ×1
SYSTEM EVOLUT FX DELIVRY 23-29 (CATHETERS) IMPLANT
SYSTEM EVOLUT FX LOADING 23-29 (CATHETERS) IMPLANT
TUBING CIL FLEX 10 FLL-RA (TUBING) IMPLANT
VALVE EVOLUT FX 26 (Valve) IMPLANT
WIRE AMPLATZ SS-J .035X180CM (WIRE) IMPLANT
WIRE EMERALD 3MM-J .035X150CM (WIRE) IMPLANT
WIRE EMERALD 3MM-J .035X260CM (WIRE) IMPLANT
WIRE EMERALD ST .035X260CM (WIRE) IMPLANT
WIRE MICRO SET SILHO 5FR 7 (SHEATH) IMPLANT
WIRE SAFARI SM CURVE 275 (WIRE) IMPLANT

## 2023-09-15 NOTE — Plan of Care (Signed)
?  Problem: Clinical Measurements: ?Goal: Will remain free from infection ?Outcome: Progressing ?Goal: Diagnostic test results will improve ?Outcome: Progressing ?Goal: Respiratory complications will improve ?Outcome: Progressing ?  ?

## 2023-09-15 NOTE — Anesthesia Preprocedure Evaluation (Signed)
Anesthesia Evaluation  Patient identified by MRN, date of birth, ID band Patient awake    Reviewed: Allergy & Precautions, NPO status , Patient's Chart, lab work & pertinent test results  Airway Mallampati: II  TM Distance: >3 FB Neck ROM: Full    Dental no notable dental hx.    Pulmonary neg pulmonary ROS   Pulmonary exam normal        Cardiovascular hypertension, Pt. on medications  Rhythm:Regular Rate:Normal + Systolic murmurs ECHO:   1. Left ventricular ejection fraction, by estimation, is 70 to 75%. The left ventricle has hyperdynamic function. Left ventricular endocardial border not optimally defined to evaluate regional wall motion. There is mild left ventricular hypertrophy. Left ventricular diastolic parameters are consistent with Grade I diastolic dysfunction (impaired relaxation). Elevated left atrial pressure.  2. Right ventricular systolic function is normal. The right ventricular size is normal. Tricuspid regurgitation signal is inadequate for assessing PA pressure.  3. The mitral valve is normal in structure. No evidence of mitral valve regurgitation. No evidence of mitral stenosis.  4. The aortic valve has an indeterminant number of cusps. There is moderate calcification of the aortic valve. There is moderate thickening of the aortic valve. Aortic valve regurgitation is mild. Severe aortic valve stenosis. Severe aortic stenosis is present. Aortic valve mean gradient measures 40.0 mmHg. Aortic valve peak gradient measures 64.6 mmHg. Aortic valve area, by VTI measures 0.70 cm.     DI 0.25  5. The inferior vena cava is normal in size with greater than 50% respiratory variability, suggesting right atrial pressure of 3 mmHg.    Neuro/Psych   Anxiety     negative neurological ROS     GI/Hepatic Neg liver ROS,GERD  ,,  Endo/Other  negative endocrine ROS    Renal/GU negative Renal ROS  negative genitourinary    Musculoskeletal negative musculoskeletal ROS (+)    Abdominal Normal abdominal exam  (+)   Peds  Hematology  (+) Blood dyscrasia, anemia Lab Results      Component                Value               Date                      WBC                      8.1                 09/15/2023                HGB                      10.9 (L)            09/15/2023                HCT                      30.0 (L)            09/15/2023                MCV                      71.8 (L)            09/15/2023  PLT                      216                 09/15/2023             Lab Results      Component                Value               Date                      NA                       137                 09/15/2023                K                        3.3 (L)             09/15/2023                CO2                      23                  09/15/2023                GLUCOSE                  77                  09/15/2023                BUN                      18                  09/15/2023                CREATININE               0.65                09/15/2023                CALCIUM                  9.1                 09/15/2023                GFRNONAA                 >60                 09/15/2023              Anesthesia Other Findings   Reproductive/Obstetrics                             Anesthesia Physical Anesthesia Plan  ASA: 4  Anesthesia Plan: MAC   Post-op Pain Management:    Induction:   PONV Risk Score and Plan: 2 and Treatment may vary due to  age or medical condition  Airway Management Planned: Simple Face Mask and Nasal Cannula  Additional Equipment: None  Intra-op Plan:   Post-operative Plan:   Informed Consent: I have reviewed the patients History and Physical, chart, labs and discussed the procedure including the risks, benefits and alternatives for the proposed anesthesia with the patient or authorized representative who has  indicated his/her understanding and acceptance.     Dental advisory given  Plan Discussed with:   Anesthesia Plan Comments:        Anesthesia Quick Evaluation

## 2023-09-15 NOTE — Progress Notes (Signed)
   09/15/23 1907  Vitals  Temp (!) 97.5 F (36.4 C)  Temp Source Oral  BP (!) 96/43  MAP (mmHg) (!) 59  BP Location Left Arm  BP Method Automatic  Patient Position (if appropriate) Lying  Pulse Rate (!) 51  ECG Heart Rate (!) 51  Resp 19  Oxygen Therapy  SpO2 95 %  O2 Device Room Air   Patient arrived from cath lab to 4e21, patient place don monitor and CCMD made aware, vital signs obtained. Bedside RN aware and in room. Call bell within reach. Zully Frane, Randall An RN

## 2023-09-15 NOTE — Progress Notes (Signed)
  Echocardiogram 2D Echocardiogram has been performed.  Dawn Lin 09/15/2023, 5:26 PM

## 2023-09-15 NOTE — Op Note (Signed)
[facility]  TAVR OPERATIVE NOTE   Date of Procedure:  09/15/2023  Preoperative Diagnosis: Severe Aortic Stenosis   Postoperative Diagnosis: Same   Procedure:   Transcatheter Aortic Valve Replacement - Transfemoral Approach  Medtronic Evolut FX (size 26 mm, model # [id number], serial # [id number])   Co-Surgeons:  Alverda Skeans, MD and Eugenio Hoes, MD   Anesthesiologist:  Hester Mates, MD  Echocardiographer:  Riley Lam, MD  Pre-operative Echo Findings: Severe aortic stenosis Normal left ventricular systolic function  Post-operative Echo Findings: No paravalvular leak Normal left ventricular systolic function  Left Hearth Catheterization:  LVEDP      BRIEF CLINICAL NOTE AND INDICATIONS FOR SURGERY  The patient is an 82 year old female with a history of hypertension, hyperlipidemia, lung adenocarcinoma s/p resection, autoimmune hemolytic anemia, and severe symptomatic aortic stenosis who is referred for a transcatheter aortic valve replacement from the right transfemoral approach with a 26 mm Evolut FX valve.  During the course of the patient's preoperative work up they have been evaluated comprehensively by a multidisciplinary team of specialists coordinated through the Multidisciplinary [facility]ve Clinic in the [facility].  They have been demonstrated to suffer from symptomatic severe aortic stenosis as noted above. The patient has been counseled extensively as to the relative risks and benefits of all options for the treatment of severe aortic stenosis including long term medical therapy, conventional surgery for aortic valve replacement, and transcatheter aortic valve replacement.  The patient has been independently evaluated by Dr. Milinda Antis with CT surgery and they are felt to be at high risk for conventional surgical aortic valve replacement. The surgeon indicated the patient would be a poor candidate for conventional  surgery. Based upon review of all of the patient's preoperative diagnostic tests they are felt to be candidate for transcatheter aortic valve replacement using the transfemoral approach as an alternative to high risk conventional surgery.    Following the decision to proceed with transcatheter aortic valve replacement, a discussion has been held regarding what types of management strategies would be attempted intraoperatively in the event of life-threatening complications, including whether or not the patient would be considered a candidate for the use of cardiopulmonary bypass and/or conversion to open sternotomy for attempted surgical intervention.  The patient has been advised of a variety of complications that might develop peculiar to this approach including but not limited to risks of death, stroke, paravalvular leak, aortic dissection or other major vascular complications, aortic annulus rupture, device embolization, cardiac rupture or perforation, acute myocardial infarction, arrhythmia, heart block or bradycardia requiring permanent pacemaker placement, congestive heart failure, respiratory failure, renal failure, pneumonia, infection, other late complications related to structural valve deterioration or migration, or other complications that might ultimately cause a temporary or permanent loss of functional independence or other long term morbidity.  The patient provides full informed consent for the procedure as described and all questions were answered preoperatively.    DETAILS OF THE OPERATIVE PROCEDURE  PREPARATION:   The patient is brought to the operating room on the above mentioned date and central monitoring was established by the anesthesia team including placement of a radial arterial line. The patient is placed in the supine position on the operating table.  Intravenous antibiotics are administered. Conscious sedation is used.   Baseline transthoracic echocardiogram was performed.  The patient's chest, abdomen, both groins, and both lower extremities are prepared and draped in a sterile manner. A time out procedure is performed.  PERIPHERAL ACCESS:   Using the modified Seldinger technique, femoral arterial and venous access were obtained with placement of a 6 Fr sheath in the left artery and a 6 Fr sheath in the right vein on the using u/s guidance.  A pigtail diagnostic catheter was passed through the femoral arterial sheath under fluoroscopic guidance into the aortic root.  Aortic root angiography was performed in order to determine the optimal angiographic angle for valve deployment.  TRANSFEMORAL ACCESS:  A micropuncture kit was used to gain access to the right femoral artery using u/s guidance. Position confirmed with angiography. Pre-closure with double ProGlide closure devices. The patient was heparinized systemically and ACT verified > 250 seconds.    A 18 Fr transfemoral Dry Seal sheath was introduced into the right femoral artery over an Amplatz superstiff wire. An AL-1 catheter was used to direct a straight-tip exchange length wire across the native aortic valve into the left ventricle. This was exchanged out for a pigtail catheter and position was confirmed in the LV apex. Simultaneous LV and Ao pressures were recorded.  The pigtail catheter was then exchanged for an Safari wire in the LV apex. Direct LV pacing thresholds were tested and found to be adequate.  BALLOON AORTIC VALVULOPLASTY: With rapid direct left ventricular pacing, a balloon aortic valvuloplasty was performed with an 18 mm balloon.  TRANSCATHETER HEART VALVE DEPLOYMENT:  A Medtronic Evolut FX THV size 26 mm was prepared and per manufacturer's guidelines, and the proper orientation of the valve is confirmed on the delivery system.  The valve was advanced through the introducer sheath into the descending aorta. The valve was then advanced across the aortic arch. The valve was carefully positioned  across the aortic valve annulus. Once final position of the valve has been confirmed with angiographic assessment in the cusp overlap view and in the LAO view, the valve is deployed with controlled rapid pacing. The valve is taken to 80% deployment and appropriate depth is confirmed. The valve is then released from each paddle. There is no valvular leak and no central aortic insufficiency.  The patient's hemodynamic recovery following valve deployment is good.  Echo demostrated acceptable post-procedural gradients, stable mitral valve function, and no AI.   PROCEDURE COMPLETION:  The sheath was then removed and closure devices were completed. Protamine was administered once femoral arterial repair was complete. The pigtail catheters and femoral sheaths were removed with a Mynx closure device placed in the artery and manual pressure used for venous hemostasis.    The patient tolerated the procedure well and is transported to the surgical intensive care in stable condition. There were no immediate intraoperative complications. All sponge instrument and needle counts are verified correct at completion of the operation.   No blood products were administered during the operation.  The patient received a total of 90 mL of intravenous contrast during the procedure.  Orbie Pyo MD 09/15/2023 5:44 PM

## 2023-09-15 NOTE — Op Note (Signed)
[facility]   MULTIDISCIPLINARY HEART VALVE TEAM     TAVR OPERATIVE NOTE   NAME@ [id number]  Date of Procedure:                 09/15/2023   Preoperative Diagnosis:      Severe Aortic Stenosis    Postoperative Diagnosis:    Same    Procedure:        Transcatheter Aortic Valve Replacement - Percutaneous right Transfemoral Approach             Medtronic Evolut FX  (size 26 mm)              Co-Surgeons:            Eugenio Hoes, MD and Alverda Skeans, MD     Anesthesiologist:                  Gavin Potters, MD   Echocardiographer:              Rosealee Albee, MD   Pre-operative Echo Findings: Severe aortic stenosis  Normal left ventricular systolic function   Post-operative Echo Findings: no paravalvular leak Normal left ventricular systolic function      BRIEF CLINICAL NOTE AND INDICATIONS FOR SURGERY    82 yo female with NYHA class 2 symptoms of severe AS with normal LV function and no CAD. Pt has a class I indication for AVR and secondary to her age and comorbidities of previous lung surgery due to adenocarcinoma and hemolytic anemia would best be served with TAVR.        DETAILS OF THE OPERATIVE PROCEDURE    PREPARATION:    The patient was brought to the operating room on the above mentioned date and appropriate monitoring was established by the anesthesia team. The patient was placed in the supine position on the operating table.  Intravenous antibiotics were administered. The patient was monitored closely throughout the procedure under conscious sedation.   Baseline transthoracic echocardiogram was performed. The patient's abdomen and both groins were prepped and draped in a sterile manner. A time out procedure was performed.   PERIPHERAL ACCESS:    Using the modified Seldinger technique, femoral arterial and venous access was obtained with placement of 6 Fr sheaths on the left and right side.  A pigtail diagnostic catheter was passed through  the left arterial sheath under fluoroscopic guidance into the aortic root.   Aortic root angiography was performed in order to determine the optimal angiographic angle for valve deployment.    TRANSFEMORAL ACCESS:    Percutaneous transfemoral access and sheath placement was performed using ultrasound guidance.  The right common femoral artery was cannulated using a micropuncture needle.  A pair of Abbott Perclose percutaneous closure devices were placed and a 6 French sheath replaced into the femoral artery.  The patient was heparinized systemically and ACT verified > 250 seconds.     An 18 Fr transfemoral Gore Dry-Seal sheath was introduced into the right femoral artery after progressively dilating over an Amplatz superstiff wire. An AL-1 catheter was used to direct a straight-tip exchange length wire across the native aortic valve into the left ventricle. This was exchanged out for a pigtail catheter and position was confirmed in the LV apex. Simultaneous LV and Ao pressures were recorded.  The pigtail catheter was exchanged for a Safari wire in the LV apex. This was used for direct LV pacing and the pacemaker was tested to ensure stable lead placement  and pacemaker capture.   BALLOON AORTIC VALVULOPLASTY:    This was performed with an 70F balloon under rapid ventricular pacing and the patient recovered nicely after it was performed.   TRANSCATHETER HEART VALVE DEPLOYMENT:    A Medtronic Evolut FX transcatheter heart valve (size 26 mm) was prepared and loaded into the delivery catheter system per manufacturer's guidelines and the proper orientation of the valve is confirmed under fluoroscopy. The delivery system and inline sheath were inserted into the right common femoral artery over the [facility] wire and the inline sheath advanced into the abdominal aorta under fluoroscopic guidance. The delivery catheter was advanced around the aortic arch and the valve was carefully positioned across the aortic  valve annulus. An aortic root injection was performed to confirm position and the valve deployed using cusp overlap technique under fluoroscopic guidance. Intermittent pacing was used during valve deployment. The delivery system and guidewire were retracted into the descending aorta and the nosecone re-sheathed. Valve function was assessed using echocardiography. There is felt to be no paravalvular leak and no central aortic insufficiency. The patient's hemodynamic recovery following valve deployment is good.        PROCEDURE COMPLETION:    The delivery system and in-line sheath were removed and femoral artery closure performed using the Perclose devices. An arteriogram of the right iliac system and closure site was performed and had no stricture.   Protamine was administered once femoral arterial repair was complete. The pigtail catheters and femoral sheaths were removed with manual pressure used venous for hemostasis and a Mynx closure device for contralateral arterial hemostasis.    The patient tolerated the procedure well and is transported to the cath lab recovery area in stable condition. There were no immediate intraoperative complications. All sponge instrument and needle counts are verified correct at completion of the operation.    No blood products were administered during the operation.   The patient received a total of 110 mL of intravenous contrast during the procedure.    Eugenio Hoes, MD

## 2023-09-15 NOTE — Anesthesia Procedure Notes (Signed)
Procedure Name: MAC Date/Time: 09/15/2023 4:22 PM  Performed by: Little Ishikawa, CRNAPre-anesthesia Checklist: Patient identified, Emergency Drugs available, Suction available, Timeout performed and Patient being monitored Patient Re-evaluated:Patient Re-evaluated prior to induction Oxygen Delivery Method: Simple face mask Induction Type: IV induction Dental Injury: Teeth and Oropharynx as per pre-operative assessment

## 2023-09-15 NOTE — Progress Notes (Signed)
Patient ID: Dawn Lin MRN: [id number] DOB/AGE: October 03, 1941 82 y.o.  Primary Care Physician:Vyas, Angelina Pih, MD Primary Cardiologist:  Dr. 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 female with the indicated medical history here for elective transcatheter aortic valve replacement.    She is followed by heme onc for beta thalassemia trait and autoimmune hemolytic anemia. She has been followed over time for aortic stenosis. Echo 07/29/23 showed EF 70% and progression to severe AS with a mean grad 40 mmHg, AVA 0.70 cm2, and mild AI. [facility] 08/06/23 showed no CAD and normal filling pressures.   The patient was evaluated by the multidisciplinary valve team and felt to have severe, symptomatic aortic stenosis and to be a suitable candidate for TAVR, which was set up for 09/15/23.     Past Medical History:  Diagnosis Date   Anemia    Anxiety    Essential hypertension, benign since the 1900's   Family history of adverse reaction to anesthesia    Children - N/V   History of blood transfusion    History of lung cancer    Lung adenocarcinoma s/p resection 2018   Hyperlipidemia    Other osteoporosis    Other thalassemia (HCC)    Severe aortic stenosis     Past Surgical History:  Procedure Laterality Date   BIOPSY  12/28/2020   Procedure: BIOPSY;  Surgeon: Dolores Frame, MD;  [facility];  Service: Gastroenterology;;   COLONOSCOPY     COLONOSCOPY WITH PROPOFOL N/A 12/28/2020   Procedure: COLONOSCOPY WITH PROPOFOL;  Surgeon: Dolores Frame, MD;  [facility];  Service: Gastroenterology;  Laterality: N/A;  AM   ESOPHAGOGASTRODUODENOSCOPY (EGD) WITH PROPOFOL N/A 12/28/2020   Procedure: ESOPHAGOGASTRODUODENOSCOPY (EGD)  WITH PROPOFOL;  Surgeon: Dolores Frame, MD;  [facility]: [facility];  Service: Gastroenterology;  Laterality: N/A;   INGUINAL HERNIA REPAIR Left    LUMBAR DISC SURGERY  1990's   for severe pain and sciatica   PARTIAL HYSTERECTOMY  1970's   for bleeding and prolapse   RIGHT/LEFT HEART CATH AND CORONARY ANGIOGRAPHY N/A 08/06/2023   Procedure: RIGHT/LEFT HEART CATH AND CORONARY ANGIOGRAPHY;  Surgeon: Kathleene Hazel, MD;  [facility];  Service: Cardiovascular;  Laterality: N/A;   VIDEO ASSISTED THORACOSCOPY (VATS)/ LOBECTOMY Right 04/20/2017   Procedure: VIDEO ASSISTED THORACOSCOPY (VATS)/RIGHT UPPER LOBECTOMY;  Surgeon: Delight Ovens, MD;  Location: [facility] OR;  Service: Thoracic;  Laterality: Right;   VIDEO BRONCHOSCOPY N/A 04/20/2017   Procedure: VIDEO BRONCHOSCOPY;  Surgeon: Delight Ovens, MD;  Location: [facility] OR;  Service: Thoracic;  Laterality: N/A;    Family History  Problem Relation Age of Onset   Hypertension Mother    Asthma Sister    Heart attack Sister 34    Social History   Socioeconomic History   Marital status: Widowed    Spouse name: Not on file   Number of children: Not on file   Years of education: Not on file   Highest education level: Not on file  Occupational History   Occupation: retired  Tobacco Use   Smoking status: Never   Smokeless tobacco: Never  Substance and Sexual Activity   Alcohol use: No   Drug use: No   Sexual activity: Never  Other Topics Concern  Not on file  Social History Narrative   Mrs. Pellicer is married. Her husband has been chronically ill for 6 years (2011). He has metastatic prostate cancer and multiple myeloma. 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 She has a lot of family support.    Social Determinants of Health   Financial Resource Strain: Low Risk  (11/27/2021)   Received from [facility], [facility]   Overall Financial Resource Strain (CARDIA)    Difficulty of  Paying Living Expenses: Not hard at all  Food Insecurity: No Food Insecurity (11/27/2021)   Received from [facility], [facility]   Hunger Vital Sign    Worried About Running Out of Food in the Last Year: Never true    Ran Out of Food in the Last Year: Never true  Transportation Needs: No Transportation Needs (11/27/2021)   Received from [facility], [facility]   [facility] - Transportation    Lack of Transportation (Medical): No    Lack of Transportation (Non-Medical): No  Physical Activity: Inactive (11/27/2021)   Received from [facility], [facility]   Exercise Vital Sign    Days of Exercise per Week: 0 days    Minutes of Exercise per Session: 0 min  Stress: No Stress Concern Present (11/27/2021)   Received from [facility], [facility] - Occupational Stress Questionnaire    Feeling of Stress : Not at all  Social Connections: Moderately Isolated (11/27/2021)   Received from [facility], [facility]   Social Connection and Isolation Panel [NHANES]    Frequency of Communication with Friends and Family: More than three times a week    Frequency of Social Gatherings with Friends and Family: More than three times a week    Attends Religious Services: More than 4 times per year    Active Member of [employer] or Organizations: No    Attends Banker Meetings: Never    Marital Status: Widowed  Intimate Partner Violence: Not At Risk (11/27/2021)   Received from [facility], [facility]   Humiliation, Afraid, Rape, and Kick questionnaire    Fear of Current or Ex-Partner: No    Emotionally Abused: No    Physically Abused: No    Sexually Abused: No     Prior to Admission medications   Medication Sig Start Date End Date Taking? Authorizing Provider  acetaminophen (TYLENOL) 500 MG tablet Take 1 tablet (500 mg total) by mouth every 6 (six) hours as needed. Patient taking differently: Take  500-1,000 mg by mouth every 6 (six) hours as needed for moderate pain (pain score 4-6). 02/16/22  Yes Nanavati, Ankit, MD  Alpha-D-Galactosidase (BEANO PO) Take 2 capsules by mouth daily as needed (bloating).   Yes [provider]  amLODipine (NORVASC) 10 MG tablet Take 10 mg by mouth daily.   Yes [provider]  Ascorbic Acid (VITAMIN C PO) Take 564 mg by mouth daily. 282 mg each   Yes [provider]  aspirin EC 81 MG tablet Take 81 mg by mouth daily.   Yes [provider]  azithromycin (ZITHROMAX) 250 MG tablet Take 250 mg by mouth as directed. 08/31/23  Yes [provider]  Calcium Carb-Cholecalciferol (CALCIUM 500 + D PO) Take 1 tablet by mouth 2 (two) times daily.   Yes [provider]  cyanocobalamin (VITAMIN B12) 1000 MCG tablet  Take 1,000 mcg by mouth daily. Sublingual   Yes [provider]  diclofenac Sodium (VOLTAREN ARTHRITIS PAIN) 1 % GEL Apply 2 g topically daily as needed (Back pain).   Yes [provider]  fexofenadine (ALLEGRA) 180 MG tablet Take 1 tablet (180 mg total) by mouth daily as needed for allergies. Patient taking differently: Take 180 mg by mouth daily. 07/06/23  Yes Birder Robson, MD  folic acid (FOLVITE) 1 MG tablet Take 2 mg by mouth daily.   Yes [provider]  furosemide (LASIX) 20 MG tablet Take 20 mg by mouth daily as needed (swelling).   Yes [provider]  guaifenesin (HUMIBID E) 400 MG TABS tablet Take 400-1,200 mg by mouth daily as needed (Drainage).   Yes [provider]  HYDROcodone bit-homatropine (HYCODAN) 5-1.5 MG/5ML syrup Take 5 mLs by mouth at bedtime as needed for cough. 08/31/23  Yes [provider]  ibuprofen (ADVIL) 200 MG tablet Take 200-400 mg by mouth every 6 (six) hours as needed for moderate pain (pain score 4-6).   Yes [provider]  ipratropium (ATROVENT) 0.03 % nasal spray Place 1 spray into both nostrils 3 (three) times  daily as needed for rhinitis. Patient taking differently: Place 1 spray into both nostrils daily. 07/06/23  Yes Birder Robson, MD  losartan (COZAAR) 25 MG tablet Take 0.5 tablets (12.5 mg total) by mouth daily. 06/26/23 09/24/23 Yes BranchDorothe Pea, MD  meclizine (ANTIVERT) 12.5 MG tablet Take 12.5 mg by mouth every 6 (six) hours as needed for dizziness. 11/19/19  Yes [provider]  Multiple Vitamin (MULTIVITAMIN WITH MINERALS) TABS tablet Take 2 tablets by mouth daily. Gummy   Yes [provider]  potassium chloride SA (KLOR-CON M) 20 MEQ tablet Take 1 tablet (20 mEq total) by mouth as needed (when taking the lasix for swelling). 12/19/22  Yes Branch, Dorothe Pea, MD  predniSONE (DELTASONE) 5 MG tablet Take 5 mg by mouth daily with breakfast. 11/19/21  Yes [provider]  Propylene Glycol, PF, (SYSTANE COMPLETE PF) 0.6 % SOLN Place 1 drop into both eyes daily as needed (Dry eyes).   Yes [provider]  rosuvastatin (CRESTOR) 5 MG tablet TAKE ONE TABLET BY MOUTH EVERY MORNING 03/31/23  Yes Branch, Dorothe Pea, MD  simethicone (MYLICON) 80 MG chewable tablet Chew 160 mg by mouth every 6 (six) hours as needed for flatulence.   Yes [provider]  Zinc 30 MG CAPS Take 30 mg by mouth 2 (two) times daily.   Yes [provider]    Allergies  Allergen Reactions   Amoxicillin Diarrhea   Lisinopril Cough    REVIEW OF SYSTEMS:  General: no fevers/chills/night sweats Eyes: no blurry vision, diplopia, or amaurosis ENT: no sore throat or hearing loss Resp: no cough, wheezing, or hemoptysis CV: no edema or palpitations GI: no abdominal pain, nausea, vomiting, diarrhea, or constipation GU: no dysuria, frequency, or hematuria Skin: no rash Neuro: no headache, numbness, tingling, or weakness of extremities Musculoskeletal: no joint pain or swelling Heme: no bleeding, DVT, or easy bruising Endo: no polydipsia or polyuria  Wt 57.6 kg   LMP  (LMP  Unknown)   BMI 25.65 kg/m   PHYSICAL EXAM: GEN:  AO x 3 in no acute distress HEENT: normal Dentition: Normal Neck: JVP normal. +2 carotid upstrokes without bruits. No thyromegaly. Lungs: equal expansion, clear bilaterally CV: Apex is discrete and nondisplaced, RRR with 3/6 SEM Abd: soft, non-tender, non-distended; no bruit;  positive bowel sounds Ext: no edema, ecchymoses, or cyanosis Vascular: 2+ femoral pulses, 2+ radial pulses       Skin: warm and dry without rash Neuro: CN II-XII grossly intact; motor and sensory grossly intact    DATA AND STUDIES:  EKG: EKG performed November 2024 demonstrates sinus rhythm  EKG Interpretation Date/Time:    Ventricular Rate:    PR Interval:    QRS Duration:    QT Interval:    QTC Calculation:   R Axis:      Text Interpretation:          Cardiac Studies & Procedures   CARDIAC CATHETERIZATION  CARDIAC CATHETERIZATION 08/06/2023  Narrative No angiographic evidence of CAD Normal right and left heart pressures (RA 5, RV 33/4/7, PA 36/12 mean 21, PCWP 10, LV 171/11/13) Peak to peak gradient 38 mmHg.  Recommendations: Continue workup for TAVR. We will contact her to arrange her pre-TAVR CT scans and visit with the CT surgeon on our team.  Findings Coronary Findings Diagnostic  Dominance: Right  Left Anterior Descending Vessel is large.  Left Circumflex Vessel is large.  Right Coronary Artery Vessel is large.  Intervention  No interventions have been documented.   STRESS TESTS  MYOCARDIAL PERFUSION IMAGING 08/04/2017  Narrative  Nuclear stress EF: 80%.  There was no ST segment deviation noted during stress.  There is a large defect of moderate severity present in the basal anteroseptal, basal inferoseptal, mid anteroseptal, mid inferoseptal, apical septal and apex location. The defect is non-reversible. In the setting of normal LVF, this is likely due to breast attenuation artifact. No ischemia noted.  This is  a low risk study.  The left ventricular ejection fraction is hyperdynamic (>65%).   ECHOCARDIOGRAM  ECHOCARDIOGRAM COMPLETE 07/29/2023  Narrative ECHOCARDIOGRAM REPORT    Patient Name:   JAILIA ENOS Date of Exam: 07/29/2023 Medical Rec #:  [id number]                  Height:       58.9 in Accession #:    [id number]                 Weight:       129.1 lb Date of Birth:  Feb 28, 1941                 BSA:          1.528 m Patient Age:    81 years                   BP:           186/70 mmHg Patient Gender: F                          HR:           59 bpm. [facility]  Procedure: 2D Echo, Cardiac Doppler and Color Doppler  Indications:    AS (aortic stenosis) [829562] SOB (shortness of breath) [130865]  History:        Patient has prior history of Echocardiogram examinations, most recent 01/15/2023. CAD, Aortic Valve Disease, Signs/Symptoms:Murmur; Risk Factors:Hypertension, Diabetes, Dyslipidemia and Non-Smoker.  Sonographer:    Celesta Gentile RCS Referring Phys: [id number] Dorothe Pea BRANCH  IMPRESSIONS   1. Left ventricular ejection fraction, by estimation, is 70 to 75%. The left ventricle has hyperdynamic function. Left ventricular endocardial border not optimally defined to evaluate regional wall motion. There  is mild left ventricular hypertrophy. Left ventricular diastolic parameters are consistent with Grade I diastolic dysfunction (impaired relaxation). Elevated left atrial pressure. 2. Right ventricular systolic function is normal. The right ventricular size is normal. Tricuspid regurgitation signal is inadequate for assessing PA pressure. 3. The mitral valve is normal in structure. No evidence of mitral valve regurgitation. No evidence of mitral stenosis. 4. The aortic valve has an indeterminant number of cusps. There is moderate calcification of the aortic valve. There is moderate thickening of the aortic valve. Aortic valve regurgitation is mild.  Severe aortic valve stenosis. Severe aortic stenosis is present. Aortic valve mean gradient measures 40.0 mmHg. Aortic valve peak gradient measures 64.6 mmHg. Aortic valve area, by VTI measures 0.70 cm. DI 0.25 5. The inferior vena cava is normal in size with greater than 50% respiratory variability, suggesting right atrial pressure of 3 mmHg.  FINDINGS Left Ventricle: Left ventricular ejection fraction, by estimation, is 70 to 75%. The left ventricle has hyperdynamic function. Left ventricular endocardial border not optimally defined to evaluate regional wall motion. The left ventricular internal cavity size was normal in size. There is mild left ventricular hypertrophy. Left ventricular diastolic parameters are consistent with Grade I diastolic dysfunction (impaired relaxation). Elevated left atrial pressure.  Right Ventricle: The right ventricular size is normal. Right vetricular wall thickness was not well visualized. Right ventricular systolic function is normal. Tricuspid regurgitation signal is inadequate for assessing PA pressure.  Left Atrium: Left atrial size was normal in size.  Right Atrium: Right atrial size was normal in size.  Pericardium: There is no evidence of pericardial effusion.  Mitral Valve: The mitral valve is normal in structure. No evidence of mitral valve regurgitation. No evidence of mitral valve stenosis.  Tricuspid Valve: The tricuspid valve is normal in structure. Tricuspid valve regurgitation is trivial. No evidence of tricuspid stenosis.  Aortic Valve: The aortic valve has an indeterminant number of cusps. There is moderate calcification of the aortic valve. There is moderate thickening of the aortic valve. There is moderate aortic valve annular calcification. Aortic valve regurgitation is mild. Aortic regurgitation PHT measures 420 msec. Severe aortic stenosis is present. Aortic valve mean gradient measures 40.0 mmHg. Aortic valve peak gradient measures 64.6  mmHg. Aortic valve area, by VTI measures 0.70 cm.  Pulmonic Valve: The pulmonic valve was not well visualized. Pulmonic valve regurgitation is not visualized. No evidence of pulmonic stenosis.  Aorta: The aortic root is normal in size and structure.  Venous: The inferior vena cava is normal in size with greater than 50% respiratory variability, suggesting right atrial pressure of 3 mmHg.  IAS/Shunts: No atrial level shunt detected by color flow Doppler.   LEFT VENTRICLE PLAX 2D LVIDd:         4.00 cm   Diastology LVIDs:         2.10 cm   LV e' medial:    5.33 cm/s LV PW:         1.10 cm   LV E/e' medial:  15.8 LV IVS:        1.20 cm   LV e' lateral:   5.11 cm/s LVOT diam:     1.90 cm   LV E/e' lateral: 16.4 LV SV:         73 LV SV Index:   48 LVOT Area:     2.84 cm   RIGHT VENTRICLE RV S prime:     10.70 cm/s TAPSE (M-mode): 2.1 cm  LEFT ATRIUM  Index        RIGHT ATRIUM           Index LA diam:        3.20 cm 2.09 cm/m   RA Area:     10.40 cm LA Vol (A2C):   61.6 ml 40.30 ml/m  RA Volume:   20.30 ml  13.28 ml/m LA Vol (A4C):   40.3 ml 26.37 ml/m LA Biplane Vol: 49.7 ml 32.52 ml/m AORTIC VALVE AV Area (Vmax):    0.68 cm AV Area (Vmean):   0.65 cm AV Area (VTI):     0.70 cm AV Vmax:           402.00 cm/s AV Vmean:          298.667 cm/s AV VTI:            1.040 m AV Peak Grad:      64.6 mmHg AV Mean Grad:      40.0 mmHg LVOT Vmax:         96.85 cm/s LVOT Vmean:        68.850 cm/s LVOT VTI:          0.258 m LVOT/AV VTI ratio: 0.25 AI PHT:            420 msec  AORTA Ao Root diam: 3.10 cm  MITRAL VALVE MV Area (PHT): 4.80 cm    SHUNTS MV Decel Time: 158 msec    Systemic VTI:  0.26 m MV E velocity: 84.00 cm/s  Systemic Diam: 1.90 cm MV A velocity: 98.50 cm/s MV E/A ratio:  0.85  Dina Rich MD Electronically signed by Dina Rich MD Signature Date/Time: 07/29/2023/2:17:43 PM    Final     CT SCANS  CT CORONARY MORPH W/CTA COR  W/SCORE 08/11/2023  Addendum 08/11/2023  9:23 PM ADDENDUM REPORT: 08/11/2023 21:21  CLINICAL DATA:  Severe Aortic Stenosis.  EXAM: Cardiac TAVR CT  TECHNIQUE: A non-contrast, gated CT scan was obtained with axial slices of 3 mm through the heart for aortic valve calcium scoring. A 120 kV retrospective, gated, contrast cardiac scan was obtained. Gantry rotation speed was 250 msecs and collimation was 0.6 mm. Nitroglycerin was not given. The 3D data set was reconstructed in 5% intervals of the 0-95% of the R-R cycle. Systolic and diastolic phases were analyzed on a dedicated workstation using MPR, MIP, and VRT modes. The patient received 100 cc of contrast.  FINDINGS: Image quality: Excellent.  Noise artifact is: Limited.  Valve Morphology: Tricuspid aortic valve with severe leaflet thickening. Moderate leaflet calcifications. Restricted leaflet movement in systole.  Aortic Valve Calcium score: 653  Aortic annular dimension:  Phase assessed: 25%  Annular area: 340 mm2  Annular perimeter: 66.3 mm  Max diameter: 22.9 mm  Min diameter: 18.9 mm  Annular and subannular calcification: None.  Membranous septum length: 6.6 mm  Optimal coplanar projection: LAO 6 CAU 11  Coronary Artery Height above Annulus:  Left Main: 14.4 mm  Right Coronary: 17.1 mm  Sinus of Valsalva Measurements:  Non-coronary: 27.8 mm  Right-coronary: 27.9 mm  Left-coronary: 28.6 mm  Sinus of Valsalva Height:  Non-coronary: 21.9 mm  Right-coronary: 20.0 mm  Left-coronary: 18.7 mm  Sinotubular Junction: 24 mm  Ascending Thoracic Aorta: 30 mm  Coronary Arteries: Normal coronary origin. Left dominance. The study was performed without use of NTG and is insufficient for plaque evaluation. Please refer to recent cardiac catheterization for coronary assessment.  Cardiac Morphology:  Right Atrium: Right atrial size is  within normal limits.  Right Ventricle: The right ventricular  cavity is within normal limits.  Left Atrium: Left atrial size is normal in size with no left atrial appendage filling defect.  Left Ventricle: The ventricular cavity size is within normal limits.  Pulmonary arteries: Normal in size without proximal filling defect.  Pulmonary veins: Normal pulmonary venous drainage.  Pericardium: Normal thickness with no significant effusion or calcium present.  Mitral Valve: The mitral valve is normal structure without significant calcification.  Extra-cardiac findings: See attached radiology report for non-cardiac structures.  IMPRESSION: 1. Annular measurements are in between a 20/23 mm S3. Findings do support a 26 mm Evolut Pro.  2. No significant annular or subannular calcifications.  3. Sufficient coronary to annulus distance.  4. Optimal Fluoroscopic Angle for Delivery: LAO 6 CAU 11  Assumption T. Flora Lipps, MD   Electronically Signed By: Lennie Odor M.D. On: 08/11/2023 21:21  Narrative EXAM: OVER-READ INTERPRETATION  CT CHEST  The following report is a limited chest CT over-read performed by radiologist Dr. Allegra Lai of [facility] on 08/11/2023. This over-read does not include interpretation of cardiac or coronary anatomy or pathology. The cardiac TAVR interpretation by the cardiologist is attached.  COMPARISON:  None Available.  FINDINGS: Extracardiac findings will be described separately under dictation for contemporaneously obtained CTA chest, abdomen and pelvis.  IMPRESSION: Please see separate dictation for contemporaneously obtained CTA chest, abdomen and pelvis dated 08/11/2023 for full description of relevant extracardiac findings.  Electronically Signed: By: Allegra Lai M.D. On: 08/11/2023 12:51          09/11/2023: ALT 20; BUN 19; Creatinine, Ser 0.80; Hemoglobin 12.4; Platelets 255; Potassium 3.2; Sodium 140   NHYA CLASS: 3  STS RISK SCORE: Procedure Type: Isolated AVR   Perioperative Outcome Estimate %  Operative Mortality 4.12%  Morbidity & Mortality 8.51%  Stroke 1.35%  Renal Failure 1.02%  Reoperation 3.5%  Prolonged Ventilation 4.39%  Deep Sternal Wound Infection 0.039%  Long Hospital Stay (>14 days) 3.9%  Short Hospital Stay (<6 days)* 41.8%   ASSESSMENT AND PLAN:   Aortic stenosis, severe - Plan: Structural Heart Procedure, Structural Heart Procedure, TAVR surgery pharmacy consult, TAVR surgery pharmacy consult, ECHO TEE, ECHO TEE  Plan for 26 mm Evolut FX via the TF approach today with Dr. Leafy Ro.    Orbie Pyo, MD  09/15/2023 8:18 AM    [facility] [address] [place], [place], [place]  [address] Phone: [phone]; Fax: [phone]

## 2023-09-15 NOTE — Transfer of Care (Signed)
Immediate Anesthesia Transfer of Care Note  Patient: Dawn Lin  Procedure(s) Performed: Transcatheter Aortic Valve Replacement, Transfemoral INTRAOPERATIVE TRANSTHORACIC ECHOCARDIOGRAM  Patient Location: PACU  Anesthesia Type:MAC  Level of Consciousness: awake, alert , and oriented  Airway & Oxygen Therapy: Patient Spontanous Breathing  Post-op Assessment: Report given to RN and Post -op Vital signs reviewed and stable  Post vital signs: Reviewed and stable  Last Vitals:  Vitals Value Taken Time  BP 103/46 09/15/23 1800  Temp    Pulse 73 09/15/23 1804  Resp 18 09/15/23 1804  SpO2 91 % 09/15/23 1804  Vitals shown include unfiled device data.  Last Pain:  Vitals:   09/15/23 1145  TempSrc:   PainSc: 0-No pain         Complications: There were no known notable events for this encounter.

## 2023-09-15 NOTE — Discharge Instructions (Signed)

## 2023-09-15 NOTE — Discharge Summary (Incomplete)
[facility]ENTER   MULTIDISCIPLINARY HEART VALVE TEAM  Discharge Summary    Patient ID: Dawn Lin MRN: [id number]; DOB: 08-Oct-1941  Admit date: 09/15/2023 Discharge date: 09/16/2023  Primary Care Provider: Ignatius Specking, MD  Primary Cardiologist: Dina Rich, MD / Dr. Lynnette Caffey & Dr. Leafy Ro (TAVR)  Discharge Diagnoses    Principal Problem:   S/P TAVR (transcatheter aortic valve replacement) Active Problems:   Other thalassemia (HCC)   Essential hypertension   Hyperlipidemia   Malignant neoplasm of right upper lobe of lung (HCC)   S/P lobectomy of lung   GERD (gastroesophageal reflux disease)   Severe aortic stenosis   Allergies Allergies  Allergen Reactions   Amoxicillin Diarrhea   Lisinopril Cough    Diagnostic Studies/Procedures    [facility]  TAVR OPERATIVE NOTE     Date of Procedure:                09/15/2023   Preoperative Diagnosis:      Severe Aortic Stenosis    Postoperative Diagnosis:    Same    Procedure:        Transcatheter Aortic Valve Replacement - Transfemoral Approach             Medtronic Evolut FX (size 26 mm, model # [id number], serial # [id number])              Co-Surgeons:                        Alverda Skeans, MD and Eugenio Hoes, MD    Anesthesiologist:                  Hester Mates, MD   Echocardiographer:              Riley Lam, MD   Pre-operative Echo Findings: Severe aortic stenosis Normal left ventricular systolic function   Post-operative Echo Findings: No paravalvular leak Normal left ventricular systolic function   Left Hearth Catheterization:             LVEDP    _____________    Echo 09/16/23: completed but pending formal read at the time of discharge   History of Present Illness     Dawn Lin is a 82 y.o. female with a history of HTN, HLD, lung adenocarcinoma s/p resection 2018, autoimmune hemolytic anemia and severe aortic  stenosis who presented to [facility] on 09/15/23 for planned TAVR.   She is followed by heme onc for beta thalassemia trait and autoimmune hemolytic anemia. She was treated with rituximab weekly x4. She had relapse of hemolysis when prednisone was discontinued. Currently on prednisone 5 mg daily and folic acid 2 mg daily.   She has been followed over time for aortic stenosis. Echo 07/29/23 showed EF 70% and progression to severe AS with a mean grad 40 mmHg, AVA 0.70 cm2, and mild AI. [name] 08/06/23 showed no CAD and normal filling pressures.   She was evaluated by the multidisciplinary valve team and felt to have severe, symptomatic aortic stenosis and to be a suitable candidate for TAVR, which was set up for 09/15/23.   Hospital Course     Consultants: none   Severe AS: s/p successful TAVR with a 26 mm Medtronic Fx THV via the TF approach on 09/15/23. Post operative echo completed but pending formal read. Groin sites are stable. ECG with NSR and no high grade heart block. Continue Asprin 81 mg daily. Walked  with cardiac rehab with no issues. Plan for discharge home today with close follow up in the outpatient setting.   Acute on chronic HFpEF: as evidence by an elevated LVEDP ~22 at the time of TAVR. She was treated with one dose of IV lasix 20mg  and Kdur x 1. Will have her resume lasix/potassium as needed at discharge.   HTN: BP well controlled. Resumed on home meds: amlodipine 10mg  daily and losartan 12.5mg  daily as well as PRN Lasix/Kdur  HLD: continue Crestor 5mg  daily.   Autoimmune hemolytic anemia: followed by heme onc. Continue prednisone 5 mg daily and folic acid 2 mg daily.  Ovarian enlargement and cysts: pre TAVR scans noted "asymmetric enlargement of the right ovary with multiple cysts, recommend further evaluation with pelvic ultrasound." This will be discussed in the outpatient setting.  _____________  Discharge Vitals Blood pressure (!) 146/57, pulse 81, temperature 99 F (37.2  C), temperature source Oral, resp. rate (!) 22, height 4\' 11"  (1.499 m), weight 58.1 kg, SpO2 99%.  Filed Weights   09/14/23 1200 09/15/23 1117 09/16/23 0542  Weight: 57.6 kg 57.6 kg 58.1 kg   GEN: Well nourished, well developed, in no acute distress HEENT: normal Neck: no JVD or masses Cardiac: RRR; soft flow murmur @ RUSB. No rubs, or gallops,no edema  Respiratory:  clear to auscultation bilaterally, normal work of breathing GI: soft, nontender, nondistended, + BS MS: no deformity or atrophy Skin: warm and dry, no rash.  Groin sites clear without hematoma or ecchymosis  Neuro:  Alert and Oriented x 3, Strength and sensation are intact Psych: euthymic mood, full affect  Disposition   Pt is being discharged home today in good condition.  Follow-up Plans & Appointments     Follow-up Information     Janetta Hora, PA-C. Go on 09/23/2023.   Specialties: Cardiology, Radiology Why: @ 11:05am, please arrive at least 10 minutes early Contact information: [address] [place] [address] [phone]                Discharge Instructions     Amb Referral to Cardiac Rehabilitation   Complete by: As directed    Diagnosis: Valve Replacement   Valve: Aortic   After initial evaluation and assessments completed: Virtual Based Care may be provided alone or in conjunction with Phase 2 Cardiac Rehab based on patient barriers.: Yes   Intensive Cardiac Rehabilitation (ICR) [facility] location only OR Traditional Cardiac Rehabilitation (TCR) *If criteria for ICR are not met will enroll in TCR [facility] only): Yes       Discharge Medications   Allergies as of 09/16/2023       Reactions   Amoxicillin Diarrhea   Lisinopril Cough        Medication List     TAKE these medications    acetaminophen 500 MG tablet Commonly known as: TYLENOL Take 1 tablet (500 mg total) by mouth every 6 (six) hours as needed. What changed:  how much to take reasons to take  this   amLODipine 10 MG tablet Commonly known as: NORVASC Take 10 mg by mouth daily.   aspirin EC 81 MG tablet Take 81 mg by mouth daily.   azithromycin 250 MG tablet Commonly known as: ZITHROMAX Take 250 mg by mouth as directed.   BEANO PO Take 2 capsules by mouth daily as needed (bloating).   CALCIUM 500 + D PO Take 1 tablet by mouth 2 (two) times daily.   cyanocobalamin 1000 MCG  tablet Commonly known as: VITAMIN B12 Take 1,000 mcg by mouth daily. Sublingual   fexofenadine 180 MG tablet Commonly known as: ALLEGRA Take 1 tablet (180 mg total) by mouth daily as needed for allergies. What changed: when to take this   folic acid 1 MG tablet Commonly known as: FOLVITE Take 2 mg by mouth daily.   furosemide 20 MG tablet Commonly known as: LASIX Take 20 mg by mouth daily as needed (swelling).   guaifenesin 400 MG Tabs tablet Commonly known as: HUMIBID E Take 400-1,200 mg by mouth daily as needed (Drainage).   HYDROcodone bit-homatropine 5-1.5 MG/5ML syrup Commonly known as: HYCODAN Take 5 mLs by mouth at bedtime as needed for cough.   ibuprofen 200 MG tablet Commonly known as: ADVIL Take 200-400 mg by mouth every 6 (six) hours as needed for moderate pain (pain score 4-6).   ipratropium 0.03 % nasal spray Commonly known as: ATROVENT Place 1 spray into both nostrils 3 (three) times daily as needed for rhinitis. What changed: when to take this   losartan 25 MG tablet Commonly known as: COZAAR Take 0.5 tablets (12.5 mg total) by mouth daily.   meclizine 12.5 MG tablet Commonly known as: ANTIVERT Take 12.5 mg by mouth every 6 (six) hours as needed for dizziness.   multivitamin with minerals Tabs tablet Take 2 tablets by mouth daily. Gummy   potassium chloride SA 20 MEQ tablet Commonly known as: KLOR-CON M Take 1 tablet (20 mEq total) by mouth as needed (when taking the lasix for swelling).   predniSONE 5 MG tablet Commonly known as: DELTASONE Take 5 mg by  mouth daily with breakfast.   rosuvastatin 5 MG tablet Commonly known as: CRESTOR TAKE ONE TABLET BY MOUTH EVERY MORNING   simethicone 80 MG chewable tablet Commonly known as: MYLICON Chew 160 mg by mouth every 6 (six) hours as needed for flatulence.   Systane Complete PF 0.6 % Soln Generic drug: Propylene Glycol (PF) Place 1 drop into both eyes daily as needed (Dry eyes).   VITAMIN C PO Take 564 mg by mouth daily. 282 mg each   Voltaren Arthritis Pain 1 % Gel Generic drug: diclofenac Sodium Apply 2 g topically daily as needed (Back pain).   Zinc 30 MG Caps Take 30 mg by mouth 2 (two) times daily.         Outstanding Labs/Studies   none  Duration of Discharge Encounter   Greater than 30 minutes including physician time.  Byrd Hesselbach, PA-C 09/16/2023, 10:34 AM [phone]   ATTENDING ATTESTATION:  After conducting a review of all available clinical information with the care team, interviewing the patient, and performing a physical exam, I agree with the findings and plan described in this note.   GEN: No acute distress.   HEENT:  MMM, no JVD, no scleral icterus Cardiac: RRR, no murmurs, rubs, or gallops.  Respiratory: Clear to auscultation bilaterally. GI: Soft, nontender, non-distended  MS: No edema; No deformity. Neuro:  Nonfocal  Vasc:  +2 radial pulses; access sites intact  Patient doing well after TAVR with stable access sites, no evidence of stroke, and no conduction abnormalities.  Due to elevated LVEDP consistent with acute on chronic diastolic heart failure will administer IV diuretics x 1 today.  Follow up echocardiogram with planned discharge and close hospital follow up.  Alverda Skeans, MD Pager [phone]

## 2023-09-15 NOTE — Interval H&P Note (Signed)
History and Physical Interval Note:  09/15/2023 1:11 PM  Dawn Lin Dawn Lin  has presented today for surgery, with the diagnosis of Severe Aortic Stenosis.  The various methods of treatment have been discussed with the patient and family. After consideration of risks, benefits and other options for treatment, the patient has consented to  Procedure(s): Transcatheter Aortic Valve Replacement, Transfemoral (N/A) INTRAOPERATIVE TRANSTHORACIC ECHOCARDIOGRAM (N/A) as a surgical intervention.  The patient's history has been reviewed, patient examined, no change in status, stable for surgery.  I have reviewed the patient's chart and labs.  Questions were answered to the patient's satisfaction.     Eugenio Hoes

## 2023-09-16 ENCOUNTER — Inpatient Hospital Stay (HOSPITAL_COMMUNITY): Payer: Medicare HMO

## 2023-09-16 ENCOUNTER — Encounter (HOSPITAL_COMMUNITY): Payer: Self-pay | Admitting: Internal Medicine

## 2023-09-16 DIAGNOSIS — D591 Autoimmune hemolytic anemia, unspecified: Secondary | ICD-10-CM | POA: Diagnosis not present

## 2023-09-16 DIAGNOSIS — I5033 Acute on chronic diastolic (congestive) heart failure: Secondary | ICD-10-CM | POA: Diagnosis not present

## 2023-09-16 DIAGNOSIS — I350 Nonrheumatic aortic (valve) stenosis: Secondary | ICD-10-CM | POA: Diagnosis not present

## 2023-09-16 DIAGNOSIS — Z952 Presence of prosthetic heart valve: Secondary | ICD-10-CM

## 2023-09-16 DIAGNOSIS — Z006 Encounter for examination for normal comparison and control in clinical research program: Secondary | ICD-10-CM | POA: Diagnosis not present

## 2023-09-16 LAB — ECHOCARDIOGRAM COMPLETE
AR max vel: 1.19 cm2
AV Area VTI: 1.46 cm2
AV Area mean vel: 1.68 cm2
AV Mean grad: 11 mm[Hg]
AV Peak grad: 25.6 mm[Hg]
Ao pk vel: 2.53 m/s
Area-P 1/2: 2.16 cm2
Height: 59 in
S' Lateral: 2.8 cm
Weight: 2049.6 [oz_av]

## 2023-09-16 LAB — BASIC METABOLIC PANEL
Anion gap: 5 (ref 5–15)
BUN: 15 mg/dL (ref 8–23)
CO2: 26 mmol/L (ref 22–32)
Calcium: 8.5 mg/dL — ABNORMAL LOW (ref 8.9–10.3)
Chloride: 106 mmol/L (ref 98–111)
Creatinine, Ser: 0.72 mg/dL (ref 0.44–1.00)
GFR, Estimated: >60 mL/min (ref >60)
Glucose, Bld: 130 mg/dL — ABNORMAL HIGH (ref 70–99)
Potassium: 3.7 mmol/L (ref 3.5–5.1)
Sodium: 137 mmol/L (ref 135–145)

## 2023-09-16 LAB — CBC
HCT: 29.3 % — ABNORMAL LOW (ref 36.0–46.0)
Hemoglobin: 9.8 g/dL — ABNORMAL LOW (ref 12.0–15.0)
MCH: 23.4 pg — ABNORMAL LOW (ref 26.0–34.0)
MCHC: 33.4 g/dL (ref 30.0–36.0)
MCV: 70.1 fL — ABNORMAL LOW (ref 80.0–100.0)
Platelets: 190 10*3/uL (ref 150–400)
RBC: 4.18 MIL/uL (ref 3.87–5.11)
RDW: 20.1 % — ABNORMAL HIGH (ref 11.5–15.5)
WBC: 13 10*3/uL — ABNORMAL HIGH (ref 4.0–10.5)
nRBC: 0.2 % (ref 0.0–0.2)

## 2023-09-16 LAB — MAGNESIUM: Magnesium: 2 mg/dL (ref 1.7–2.4)

## 2023-09-16 MED ORDER — FUROSEMIDE 10 MG/ML IJ SOLN
20.0000 mg | Freq: Once | INTRAMUSCULAR | Status: AC
  Administered 2023-09-16: 20 mg via INTRAVENOUS
  Filled 2023-09-16: qty 2

## 2023-09-16 MED ORDER — POTASSIUM CHLORIDE CRYS ER 20 MEQ PO TBCR
40.0000 meq | EXTENDED_RELEASE_TABLET | Freq: Once | ORAL | Status: AC
  Administered 2023-09-16: 40 meq via ORAL
  Filled 2023-09-16: qty 2

## 2023-09-16 NOTE — Progress Notes (Signed)
Pt s/p TAVR stable for transition home today, family to transport home, no TOC needs noted     09/16/23 1108  TOC Brief Assessment  Insurance and Status Reviewed  Patient has primary care physician Yes  Home environment has been reviewed home  Prior level of function: independent  Social Determinants of Health Reivew SDOH reviewed no interventions necessary  Readmission risk has been reviewed Yes  Transition of care needs no transition of care needs at this time

## 2023-09-16 NOTE — Anesthesia Postprocedure Evaluation (Signed)
Anesthesia Post Note  Patient: Dawn Lin  Procedure(s) Performed: Transcatheter Aortic Valve Replacement, Transfemoral INTRAOPERATIVE TRANSTHORACIC ECHOCARDIOGRAM     Patient location during evaluation: PACU Anesthesia Type: MAC Level of consciousness: awake and alert Pain management: pain level controlled Vital Signs Assessment: post-procedure vital signs reviewed and stable Respiratory status: spontaneous breathing, nonlabored ventilation, respiratory function stable and patient connected to nasal cannula oxygen Cardiovascular status: stable and blood pressure returned to baseline Postop Assessment: no apparent nausea or vomiting Anesthetic complications: no   There were no known notable events for this encounter.  Last Vitals:  Vitals:   09/16/23 0833 09/16/23 1114  BP: (!) 146/57 (!) 152/60  Pulse: 81 93  Resp: (!) 22 16  Temp: 37.2 C 37.1 C  SpO2: 99% 97%    Last Pain:  Vitals:   09/16/23 1114  TempSrc: Oral  PainSc:                  Dawn Lin

## 2023-09-16 NOTE — Progress Notes (Signed)
Patient has been provided discharge instructions to include follow up appointments and site care. No changes to medications. Patient verbalizes understanding of instructions.

## 2023-09-16 NOTE — Progress Notes (Signed)
Echocardiogram 2D Echocardiogram has been performed.  Warren Lacy Pasco Marchitto RDCS 09/16/2023, 9:11 AM

## 2023-09-16 NOTE — Progress Notes (Signed)
CARDIAC REHAB PHASE I   PRE:  Rate/Rhythm: 82 SR   BP:  Sitting: 146/57      SaO2: 98 RA   MODE:  Ambulation: 150 ft   POST:  Rate/Rhythm: 88 SR   BP:  Sitting: 162/62      SaO2: 98 RA  Pt ambulated in hallway. Tolerated well with no pain, SOB or dizziness. Returned to bed with cal bell and bedside table in reach. Post TAVR education including site care, restrictions, exercise guidelines, heart healthy diet and CRP2 reviewed. All questions and concerns addressed. Will refer to AP for CRP2. Plan for home later today.   [phone]  Woodroe Chen, RN BSN 09/16/2023 9:32 AM

## 2023-09-17 ENCOUNTER — Telehealth: Payer: Self-pay | Admitting: Physician Assistant

## 2023-09-17 NOTE — Telephone Encounter (Signed)
  [facility] AND [facility]ASCULAR CENTER   MULTIDISCIPLINARY HEART VALVE TEAM   Patient contacted regarding discharge from [facility] on 12/4  Patient understands to follow up with a structural heart APP on 12/13 at [address] [facility].  Patient understands discharge instructions? yes Patient understands medications and regimen? yes Patient understands to bring all medications to this visit? yes  Cline Crock PA-C  [facility]

## 2023-09-25 ENCOUNTER — Ambulatory Visit: Payer: Medicare HMO | Attending: Cardiovascular Disease | Admitting: Physician Assistant

## 2023-09-25 ENCOUNTER — Ambulatory Visit: Payer: Medicare HMO | Admitting: Student

## 2023-09-25 VITALS — BP 136/60 | HR 77 | Ht 59.0 in | Wt 127.4 lb

## 2023-09-25 DIAGNOSIS — I10 Essential (primary) hypertension: Secondary | ICD-10-CM | POA: Diagnosis not present

## 2023-09-25 DIAGNOSIS — N83209 Unspecified ovarian cyst, unspecified side: Secondary | ICD-10-CM | POA: Diagnosis not present

## 2023-09-25 DIAGNOSIS — Z952 Presence of prosthetic heart valve: Secondary | ICD-10-CM | POA: Diagnosis not present

## 2023-09-25 DIAGNOSIS — I5030 Unspecified diastolic (congestive) heart failure: Secondary | ICD-10-CM

## 2023-09-25 DIAGNOSIS — E782 Mixed hyperlipidemia: Secondary | ICD-10-CM | POA: Diagnosis not present

## 2023-09-25 DIAGNOSIS — D594 Other nonautoimmune hemolytic anemias: Secondary | ICD-10-CM

## 2023-09-25 MED ORDER — AZITHROMYCIN 500 MG PO TABS: 500.0000 mg | ORAL_TABLET | ORAL | 12 refills | Status: AC

## 2023-09-25 NOTE — Progress Notes (Addendum)
HEART AND VASCULAR CENTER   MULTIDISCIPLINARY HEART VALVE CLINIC                                     Cardiology Office Note:    Date:  09/25/2023   ID:  Dawn Lin, DOB 1940/11/02, MRN [id number]  PCP:  Ignatius Specking, MD  [facility] Cardiologist:  Dina Rich, MD / Dr. Lynnette Caffey & Dr. Leafy Ro (TAVR)  [facility] Electrophysiologist:  None   Referring MD: Ignatius Specking, MD   [facility] s/p TAVR  History of Present Illness:    Dawn Lin is a 82 y.o. 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  She is followed by heme onc for beta thalassemia trait and autoimmune hemolytic anemia. She was treated with rituximab weekly x4. She had relapse of hemolysis when prednisone was discontinued. Currently on prednisone 5 mg daily and folic acid 2 mg daily. She has been followed over time for aortic stenosis. Echo 07/29/23 showed EF 70% and progression to severe AS with a mean grad 40 mmHg, AVA 0.70 cm2, and mild AI. [facility] 08/06/23 showed no CAD and normal filling pressures. S/p TAVR with a 26 mm Medtronic Fx THV via the TF approach on 09/15/23. Post operative echo showed EF 65%, normally functioning TAVR with a mean gradient of 11 mmHg and no PVL. She was discharged on Asprin 81 mg daily.    Today the patient presents to clinic for follow up. Here with her son. No CP. Has had some SOB but much improved from previous. No LE edema, orthopnea or PND. No dizziness or syncope. No blood in stool or urine. No palpitations.     Past Medical History:  Diagnosis Date   Anemia    Anxiety    Essential hypertension, benign since the 1900's   Family history of adverse reaction to anesthesia    Children - N/V   History of blood transfusion    History of lung cancer    Lung adenocarcinoma s/p resection 2018   Hyperlipidemia    Other  osteoporosis    Other thalassemia (HCC)    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 Lynnette Caffey and Dr. Leafy Ro   Severe aortic stenosis      Current Medications: Current Meds  Medication Sig   acetaminophen (TYLENOL) 500 MG tablet Take 1 tablet (500 mg total) by mouth every 6 (six) hours as needed. (Patient taking differently: Take 500-1,000 mg by mouth every 6 (six) hours as needed for moderate pain (pain score 4-6).)   Alpha-D-Galactosidase (BEANO PO) Take 2 capsules by mouth daily as needed (bloating).   amLODipine (NORVASC) 10 MG tablet Take 10 mg by mouth daily.   Ascorbic Acid (VITAMIN C PO) Take 564 mg by mouth daily. 282 mg each   aspirin EC 81 MG tablet Take 81 mg by mouth daily.   azithromycin (ZITHROMAX) 500 MG tablet Take 1 tablet (500 mg total) by mouth as directed. Take one tablet 1 hour before any dental work including cleanings.   Calcium Carb-Cholecalciferol (CALCIUM 500 + D PO) Take 1 tablet by mouth 2 (two) times daily.   cyanocobalamin (VITAMIN B12) 1000 MCG tablet Take 1,000 mcg by mouth  daily. Sublingual   diclofenac Sodium (VOLTAREN ARTHRITIS PAIN) 1 % GEL Apply 2 g topically daily as needed (Back pain).   fexofenadine (ALLEGRA) 180 MG tablet Take 1 tablet (180 mg total) by mouth daily as needed for allergies. (Patient taking differently: Take 180 mg by mouth daily.)   folic acid (FOLVITE) 1 MG tablet Take 2 mg by mouth daily.   furosemide (LASIX) 20 MG tablet Take 20 mg by mouth daily as needed (swelling).   guaifenesin (HUMIBID E) 400 MG TABS tablet Take 400-1,200 mg by mouth daily as needed (Drainage).   HYDROcodone bit-homatropine (HYCODAN) 5-1.5 MG/5ML syrup Take 5 mLs by mouth at bedtime as needed for cough.   ibuprofen (ADVIL) 200 MG tablet Take 200-400 mg by mouth every 6 (six) hours as needed for moderate pain (pain score 4-6).   ipratropium (ATROVENT) 0.03 % nasal spray Place 1 spray  into both nostrils 3 (three) times daily as needed for rhinitis. (Patient taking differently: Place 1 spray into both nostrils daily.)   meclizine (ANTIVERT) 12.5 MG tablet Take 12.5 mg by mouth every 6 (six) hours as needed for dizziness.   Multiple Vitamin (MULTIVITAMIN WITH MINERALS) TABS tablet Take 2 tablets by mouth daily. Gummy   potassium chloride SA (KLOR-CON M) 20 MEQ tablet Take 1 tablet (20 mEq total) by mouth as needed (when taking the lasix for swelling).   predniSONE (DELTASONE) 5 MG tablet Take 5 mg by mouth daily with breakfast.   Propylene Glycol, PF, (SYSTANE COMPLETE PF) 0.6 % SOLN Place 1 drop into both eyes daily as needed (Dry eyes).   rosuvastatin (CRESTOR) 5 MG tablet TAKE ONE TABLET BY MOUTH EVERY MORNING   simethicone (MYLICON) 80 MG chewable tablet Chew 160 mg by mouth every 6 (six) hours as needed for flatulence.   Zinc 30 MG CAPS Take 30 mg by mouth 2 (two) times daily.   [DISCONTINUED] azithromycin (ZITHROMAX) 250 MG tablet Take 250 mg by mouth as directed.      ROS:   Please see the history of present illness.    All other systems reviewed and are negative.  EKGs   EKG Interpretation Date/Time:  Friday September 25 2023 11:15:33 EST Ventricular Rate:  78 PR Interval:  136 QRS Duration:  80 QT Interval:  384 QTC Calculation: 437 R Axis:   12  Text Interpretation: Normal sinus rhythm Confirmed by Cline Crock [phone]) on 09/25/2023 2:26:15 PM   Risk Assessment/Calculations:           Physical Exam:    VS:  BP 136/60   Pulse 77   Ht 4\' 11"  (1.499 m)   Wt 127 lb 6.4 oz (57.8 kg)   LMP  (LMP Unknown)   SpO2 98%   BMI 25.73 kg/m     Wt Readings from Last 3 Encounters:  09/25/23 127 lb 6.4 oz (57.8 kg)  09/16/23 128 lb 1.6 oz (58.1 kg)  08/27/23 127 lb (57.6 kg)     GEN: Well nourished, well developed in no acute distress NECK: No JVD CARDIAC: RRR, soft flow murmur @ RUSB. No rubs, gallops RESPIRATORY:  Clear to auscultation without  rales, wheezing or rhonchi  ABDOMEN: Soft, non-tender, non-distended EXTREMITIES:  No edema; No deformity.  Groin sites clear without hematoma or ecchymosis.   ASSESSMENT:    1. S/P TAVR (transcatheter aortic valve replacement)   2. Heart failure with preserved ejection fraction, unspecified HF chronicity (HCC)   3. Essential hypertension   4. Mixed hyperlipidemia  5. Other non-autoimmune hemolytic anemias (HCC)   6. Cyst of ovary, unspecified laterality     PLAN:    In order of problems listed above:  Severe AS s/p TAVR: pt doing great s/p TAVR. ECG with no HAVB. Groin sites healing well. SBE prophylaxis discussed; I have RX'd azithromycin due to a PCN allergy. Continue Asprin 81 mg daily. I will see back for 1 month echo and OV.   HFpEF: appears euvolemic. Continue Lasix 20 mg / KClor 20 meq as needed    HTN: BP well controlled. Continue on amlodipine 10mg  daily and losartan 12.5mg  daily as well as PRN Lasix/Kclor   HLD: continue Crestor 5mg  daily.    Autoimmune hemolytic anemia: followed by heme onc. Continue prednisone 5 mg daily and folic acid 2 mg daily.   Ovarian enlargement and cysts: pre TAVR scans noted "asymmetric enlargement of the right ovary with multiple cysts, recommend further evaluation with pelvic ultrasound." Discussed with pt today and will get [place] set up today.  Medication Adjustments/Labs and Tests Ordered: Current medicines are reviewed at length with the patient today.  Concerns regarding medicines are outlined above.  Orders Placed This Encounter  Procedures   US PELVIS (TRANSABDOMINAL ONLY)   EKG 12-Lead   Meds ordered this encounter  Medications   azithromycin (ZITHROMAX) 500 MG tablet    Sig: Take 1 tablet (500 mg total) by mouth as directed. Take one tablet 1 hour before any dental work including cleanings.    Dispense:  6 tablet    Refill:  12    Supervising Provider:   Tonny Bollman [[id number]]    Patient Instructions  Medication  Instructions:  Start Azithromycin (ZITHROMAX) 500 MG, take 1 tablet by mouth 1 hour before any dental work or cleanings  *If you need a refill on your cardiac medications before your next appointment, please call your pharmacy*   Lab Work: None ordered   If you have labs (blood work) drawn today and your tests are completely normal, you will receive your results only by: MyChart Message (if you have MyChart) OR A paper copy in the mail If you have any lab test that is abnormal or we need to change your treatment, we will call you to review the results.   Testing/Procedures: Your physician has requested that you have a pelvic ultrasound    Follow-Up: Follow up as scheduled   Other Instructions     Signed, Cline Crock, PA-C  09/25/2023 2:28 PM    [facility]

## 2023-09-25 NOTE — Patient Instructions (Addendum)
Medication Instructions:  Start Azithromycin (ZITHROMAX) 500 MG, take 1 tablet by mouth 1 hour before any dental work or cleanings  *If you need a refill on your cardiac medications before your next appointment, please call your pharmacy*   Lab Work: None ordered   If you have labs (blood work) drawn today and your tests are completely normal, you will receive your results only by: MyChart Message (if you have MyChart) OR A paper copy in the mail If you have any lab test that is abnormal or we need to change your treatment, we will call you to review the results.   Testing/Procedures: Your physician has requested that you have a pelvic ultrasound    Follow-Up: Follow up as scheduled   Other Instructions

## 2023-09-30 ENCOUNTER — Encounter (HOSPITAL_COMMUNITY)
Admission: RE | Admit: 2023-09-30 | Discharge: 2023-09-30 | Disposition: A | Discharge status: Home / Self Care | Payer: Medicare HMO | Source: Ambulatory Visit | Attending: Cardiology | Admitting: Cardiology

## 2023-09-30 VITALS — Ht 59.0 in | Wt 129.6 lb

## 2023-09-30 DIAGNOSIS — M9902 Segmental and somatic dysfunction of thoracic region: Secondary | ICD-10-CM | POA: Diagnosis not present

## 2023-09-30 DIAGNOSIS — M5442 Lumbago with sciatica, left side: Secondary | ICD-10-CM | POA: Diagnosis not present

## 2023-09-30 DIAGNOSIS — Z953 Presence of xenogenic heart valve: Secondary | ICD-10-CM | POA: Insufficient documentation

## 2023-09-30 DIAGNOSIS — M9905 Segmental and somatic dysfunction of pelvic region: Secondary | ICD-10-CM | POA: Diagnosis not present

## 2023-09-30 DIAGNOSIS — M25562 Pain in left knee: Secondary | ICD-10-CM | POA: Diagnosis not present

## 2023-09-30 DIAGNOSIS — M9903 Segmental and somatic dysfunction of lumbar region: Secondary | ICD-10-CM | POA: Diagnosis not present

## 2023-09-30 NOTE — Progress Notes (Signed)
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 (Patient taking differently: Take 500-1,000 mg by mouth every 6 (six) hours as needed for moderate pain (pain score 4-6).), Disp: 30 tablet, Rfl: 0   Alpha-D-Galactosidase (BEANO PO), Take 2 capsules by mouth daily as needed (bloating)., Disp: , Rfl:    amLODipine (NORVASC) 10 MG tablet, Take 10 mg by mouth daily., Disp: , Rfl:    Ascorbic Acid (VITAMIN C PO), Take 564 mg by mouth daily. 282 mg each, Disp: , Rfl:    aspirin EC 81 MG tablet, Take 81 mg by mouth daily., Disp: , Rfl:    azithromycin (ZITHROMAX) 500 MG tablet, Take 1 tablet (500 mg total) by mouth as directed. Take one tablet 1 hour before any dental work including cleanings., Disp: 6 tablet, Rfl: 12   Calcium Carb-Cholecalciferol (CALCIUM 500 + D PO), Take 1 tablet by mouth 2 (two) times daily., Disp: , Rfl:    cyanocobalamin (VITAMIN B12) 1000 MCG tablet, Take 1,000 mcg by mouth daily. Sublingual, Disp: , Rfl:    diclofenac Sodium (VOLTAREN ARTHRITIS PAIN) 1 % GEL, Apply 2 g topically daily as needed (Back pain)., Disp: , Rfl:    fexofenadine (ALLEGRA) 180 MG tablet, Take 1 tablet (180 mg total) by mouth daily as needed for allergies. (Patient taking differently: Take 180 mg by mouth daily.), Disp: 90  tablet, Rfl: 3   folic acid (FOLVITE) 1 MG tablet, Take 2 mg by mouth daily., Disp: , Rfl:    furosemide (LASIX) 20 MG tablet, Take 20 mg by mouth daily as needed (swelling)., Disp: , Rfl:    guaifenesin (HUMIBID E) 400 MG TABS tablet, Take 400-1,200 mg by mouth daily as needed (Drainage)., Disp: , Rfl:    HYDROcodone bit-homatropine (HYCODAN) 5-1.5 MG/5ML syrup, Take 5 mLs by mouth at bedtime as needed for cough., Disp: , Rfl:    ibuprofen (ADVIL) 200 MG tablet, Take 200-400 mg by mouth every 6 (six) hours as needed for moderate pain (pain score 4-6)., Disp: , Rfl:    ipratropium (ATROVENT) 0.03 % nasal spray, Place 1 spray into both nostrils 3 (three) times daily as needed for rhinitis. (Patient taking differently: Place 1 spray into both nostrils daily.), Disp: 90 mL, Rfl: 3   meclizine (ANTIVERT) 12.5 MG tablet, Take 12.5 mg by mouth every 6 (six) hours as needed for dizziness., Disp: , Rfl:    Multiple Vitamin (MULTIVITAMIN WITH MINERALS) TABS tablet, Take 2 tablets by mouth daily. Gummy, Disp: , Rfl:    potassium chloride SA (KLOR-CON M) 20 MEQ tablet, Take 1 tablet (20 mEq total) by mouth as needed (when taking the lasix for swelling)., Disp: 90 tablet, Rfl: 3   predniSONE (DELTASONE) 5 MG tablet, Take 5 mg by mouth daily with breakfast., Disp: , Rfl:    Propylene Glycol, PF, (SYSTANE COMPLETE PF) 0.6 % SOLN,  Place 1 drop into both eyes daily as needed (Dry eyes)., Disp: , Rfl:    rosuvastatin (CRESTOR) 5 MG tablet, TAKE ONE TABLET BY MOUTH EVERY MORNING, Disp: 90 tablet, Rfl: 2   simethicone (MYLICON) 80 MG chewable tablet, Chew 160 mg by mouth every 6 (six) hours as needed for flatulence., Disp: , Rfl:    Zinc 30 MG CAPS, Take 30 mg by mouth 2 (two) times daily., Disp: , Rfl:    losartan (COZAAR) 25 MG tablet, Take 0.5 tablets (12.5 mg total) by mouth daily., Disp: 45 tablet, Rfl: 3  Past Medical History: Past Medical History:  Diagnosis Date   Anemia    Anxiety    Essential  hypertension, benign since the 1900's   Family history of adverse reaction to anesthesia    Children - N/V   History of blood transfusion    History of lung cancer    Lung adenocarcinoma s/p resection 2018   Hyperlipidemia    Other osteoporosis    Other thalassemia (HCC)    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 Lynnette Caffey and Dr. Leafy Ro   Severe aortic stenosis     Tobacco Use: Social History   Tobacco Use  Smoking Status Never  Smokeless Tobacco Never    Labs: Review Flowsheet  More data exists      Latest Ref Rng & Units 04/21/2017 11/13/2017 02/18/2018 08/06/2023 09/15/2023  Labs for ITP Cardiac and Pulmonary Rehab  Cholestrol 100 - 199 mg/dL - - 161  - -  LDL (calc) 0 - 99 mg/dL - - 68  - -  Direct LDL 0 - 99 mg/dL - 096  - - -  HDL-C >04 mg/dL - - 90  - -  Trlycerides 0 - 149 mg/dL - - 50  - -  PH, Arterial 7.35 - 7.45 7.405  - - 7.378  -  PCO2 arterial 32 - 48 mmHg 40.9  - - 40.7  -  Bicarbonate 20.0 - 28.0 mmol/L 25.1  - - 23.8  23.9  -  TCO2 22 - 32 mmol/L - - - 25  25  24    Acid-base deficit 0.0 - 2.0 mmol/L - - - 2.0  1.0  -  O2 Saturation % 95.3  - - 71  93  -    Details       Multiple values from one day are sorted in reverse-chronological order         Capillary Blood Glucose: Lab Results  Component Value Date   GLUCAP 88 04/27/2017   GLUCAP 133 (H) 04/26/2017   GLUCAP 133 (H) 04/22/2017   GLUCAP 122 (H) 04/22/2017   GLUCAP 126 (H) 04/21/2017     Exercise Target Goals: Exercise Program Goal: Individual exercise prescription set using results from initial 6 min walk test and THRR while considering  patient's activity barriers and safety.   Exercise Prescription Goal: Starting with aerobic activity 30 plus minutes a day, 3 days per week for initial exercise prescription. Provide home exercise prescription and guidelines that participant acknowledges understanding prior  to discharge.  Activity Barriers & Risk Stratification:  Activity Barriers & Cardiac Risk Stratification - 09/30/23 1419       Activity Barriers & Cardiac Risk Stratification   Activity Barriers Arthritis;Back Problems;Neck/Spine Problems;Deconditioning;Balance Concerns;History of Falls;Shortness of Breath    Cardiac Risk Stratification Moderate  6 Minute Walk:  6 Minute Walk     Row Name 09/30/23 1534         6 Minute Walk   Phase Initial     Distance 840 feet     Walk Time 6 minutes     # of Rest Breaks 1  25 second sitting break     MPH 1.6     METS 1.14     RPE 12     Perceived Dyspnea  1     VO2 Peak 4.93     Symptoms No     Resting HR 82 bpm     Resting BP 140/60     Resting Oxygen Saturation  97 %     Exercise Oxygen Saturation  during 6 min walk 95 %     Max Ex. HR 91 bpm     Max Ex. BP 146/60     2 Minute Post BP 136/60              Oxygen Initial Assessment:   Oxygen Re-Evaluation:   Oxygen Discharge (Final Oxygen Re-Evaluation):   Initial Exercise Prescription:  Initial Exercise Prescription - 09/30/23 1500       Date of Initial Exercise RX and Referring Provider   Date 09/30/23    Referring Provider Dina Rich MD      Oxygen   Oxygen Continuous    Maintain Oxygen Saturation 88% or higher      Treadmill   MPH 1.2    Grade 0    Minutes 15    METs 1.8      NuStep   Level 1    SPM 50    Minutes 15    METs 1.4      Prescription Details   Frequency (times per week) 3    Duration Progress to 30 minutes of continuous aerobic without signs/symptoms of physical distress      Intensity   THRR 40-80% of Max Heartrate 105-128    Ratings of Perceived Exertion 11-13    Perceived Dyspnea 0-4      Resistance Training   Training Prescription Yes    Weight 3 lbs    Reps 10-15             Perform Capillary Blood Glucose checks as needed.  Exercise Prescription Changes:   Exercise Prescription Changes      Row Name 09/30/23 1500             Response to Exercise   Blood Pressure (Admit) 140/60       Blood Pressure (Exercise) 146/60       Blood Pressure (Exit) 136/60       Heart Rate (Admit) 82 bpm       Heart Rate (Exercise) 91 bpm       Heart Rate (Exit) 85 bpm       Oxygen Saturation (Admit) 97 %       Oxygen Saturation (Exercise) 95 %       Oxygen Saturation (Exit) 95 %       Rating of Perceived Exertion (Exercise) 12       Perceived Dyspnea (Exercise) 0       Symptoms R hip pain 5/10       Duration Progress to 30 minutes of  aerobic without signs/symptoms of physical distress       Intensity THRR unchanged                Exercise Comments:  Exercise Goals and Review:   Exercise Goals     Row Name 09/30/23 1540             Exercise Goals   Increase Physical Activity Yes       Intervention Provide advice, education, support and counseling about physical activity/exercise needs.;Develop an individualized exercise prescription for aerobic and resistive training based on initial evaluation findings, risk stratification, comorbidities and participant's personal goals.       Expected Outcomes Short Term: Attend rehab on a regular basis to increase amount of physical activity.;Long Term: Add in home exercise to make exercise part of routine and to increase amount of physical activity.;Long Term: Exercising regularly at least 3-5 days a week.       Increase Strength and Stamina Yes       Intervention Provide advice, education, support and counseling about physical activity/exercise needs.;Develop an individualized exercise prescription for aerobic and resistive training based on initial evaluation findings, risk stratification, comorbidities and participant's personal goals.       Expected Outcomes Short Term: Increase workloads from initial exercise prescription for resistance, speed, and METs.;Short Term: Perform resistance training exercises routinely during rehab and  add in resistance training at home;Long Term: Improve cardiorespiratory fitness, muscular endurance and strength as measured by increased METs and functional capacity ( )       Able to understand and use rate of perceived exertion (RPE) scale Yes       Intervention Provide education and explanation on how to use RPE scale       Expected Outcomes Short Term: Able to use RPE daily in rehab to express subjective intensity level;Long Term:  Able to use RPE to guide intensity level when exercising independently       Able to understand and use Dyspnea scale Yes       Intervention Provide education and explanation on how to use Dyspnea scale       Expected Outcomes Short Term: Able to use Dyspnea scale daily in rehab to express subjective sense of shortness of breath during exertion;Long Term: Able to use Dyspnea scale to guide intensity level when exercising independently       Knowledge and understanding of Target Heart Rate Range (THRR) Yes       Intervention Provide education and explanation of THRR including how the numbers were predicted and where they are located for reference       Expected Outcomes Short Term: Able to state/look up THRR;Long Term: Able to use THRR to govern intensity when exercising independently;Short Term: Able to use daily as guideline for intensity in rehab       Able to check pulse independently Yes       Intervention Provide education and demonstration on how to check pulse in carotid and radial arteries.;Review the importance of being able to check your own pulse for safety during independent exercise       Expected Outcomes Short Term: Able to explain why pulse checking is important during independent exercise       Understanding of Exercise Prescription Yes       Intervention Provide education, explanation, and written materials on patient's individual exercise prescription       Expected Outcomes Short Term: Able to explain program exercise prescription;Long Term: Able  to explain home exercise prescription to exercise independently                Exercise Goals Re-Evaluation :    Discharge Exercise Prescription (Final Exercise Prescription  Changes):  Exercise Prescription Changes - 09/30/23 1500       Response to Exercise   Blood Pressure (Admit) 140/60    Blood Pressure (Exercise) 146/60    Blood Pressure (Exit) 136/60    Heart Rate (Admit) 82 bpm    Heart Rate (Exercise) 91 bpm    Heart Rate (Exit) 85 bpm    Oxygen Saturation (Admit) 97 %    Oxygen Saturation (Exercise) 95 %    Oxygen Saturation (Exit) 95 %    Rating of Perceived Exertion (Exercise) 12    Perceived Dyspnea (Exercise) 0    Symptoms R hip pain 5/10    Duration Progress to 30 minutes of  aerobic without signs/symptoms of physical distress    Intensity THRR unchanged             Nutrition:  Target Goals: Understanding of nutrition guidelines, daily intake of sodium 1500mg , cholesterol 200mg , calories 30% from fat and 7% or less from saturated fats, daily to have 5 or more servings of fruits and vegetables.  Biometrics:  Pre Biometrics - 09/30/23 1540       Pre Biometrics   Height 4\' 11"  (1.499 m)    Weight 58.8 kg    Waist Circumference 34 inches    Hip Circumference 40 inches    Waist to Hip Ratio 0.85 %    BMI (Calculated) 26.17    Grip Strength 18.9 kg    Single Leg Stand 2 seconds              Nutrition Therapy Plan and Nutrition Goals:   Nutrition Assessments:  MEDIFICTS Score Key: >=70 Need to make dietary changes  40-70 Heart Healthy Diet <= 40 Therapeutic Level Cholesterol Diet  Flowsheet Row CARDIAC REHAB PHASE II ORIENTATION from 09/30/2023 in [facility] CARD[facility]AC [facility]ILIT[facility]  Picture Your Plate Total Score on Admission 38      Picture Your Plate Scores: <16 Unhealthy dietary pattern with much room for improvement. 41-50 Dietary pattern unlikely to meet recommendations for good health and room for improvement. 51-60 More  healthful dietary pattern, with some room for improvement.  >60 Healthy dietary pattern, although there may be some specific behaviors that could be improved.    Nutrition Goals Re-Evaluation:   Nutrition Goals Discharge (Final Nutrition Goals Re-Evaluation):   Psychosocial: Target Goals: Acknowledge presence or absence of significant depression and/or stress, maximize coping skills, provide positive support system. Participant is able to verbalize types and ability to use techniques and skills needed for reducing stress and depression.  Initial Review & Psychosocial Screening:  Initial Psych Review & Screening - 09/30/23 1520       Initial Review   Current issues with None Identified      Family Dynamics   Good Support System? Yes    Comments Sisters, 2 sons and daughter-in-laws and chruch family.      Barriers   Psychosocial barriers to participate in program The patient should benefit from training in stress management and relaxation.;There are no identifiable barriers or psychosocial needs.      Screening Interventions   Interventions Encouraged to exercise;To provide support and resources with identified psychosocial needs;Provide feedback about the scores to participant    Expected Outcomes Long Term Goal: Stressors or current issues are controlled or eliminated.;Short Term goal: Utilizing psychosocial counselor, staff and physician to assist with identification of specific Stressors or current issues interfering with healing process. Setting desired goal for each stressor or current issue identified.;Short Term goal: Identification  and review with participant of any Quality of Life or Depression concerns found by scoring the questionnaire.;Long Term goal: The participant improves quality of Life and PHQ9 Scores as seen by post scores and/or verbalization of changes             Quality of Life Scores:  Quality of Life - 09/30/23 1543       Quality of Life   Select  Quality of Life      Quality of Life Scores   Health/Function Pre 19.32 %    Socioeconomic Pre 24.75 %    Psych/Spiritual Pre 21.07 %    Family Pre 21 %    GLOBAL Pre 20.98 %            Scores of 19 and below usually indicate a poorer quality of life in these areas.  A difference of  2-3 points is a clinically meaningful difference.  A difference of 2-3 points in the total score of the Quality of Life Index has been associated with significant improvement in overall quality of life, self-image, physical symptoms, and general health in studies assessing change in quality of life.  PHQ-9: Review Flowsheet  More data exists      09/30/2023 11/28/2011 10/31/2011 07/28/2011 06/10/2011  Depression screen PHQ 2/9  Decreased Interest 1 0 0 0 0  Down, Depressed, Hopeless 0 0 0 1 0  PHQ - 2 Score 1 0 0 1 0  Altered sleeping 1 - - - -  Tired, decreased energy 2 - - - -  Change in appetite 2 - - - -  Feeling bad or failure about yourself  0 - - - -  Trouble concentrating 1 - - - -  Moving slowly or fidgety/restless 0 - - - -  Suicidal thoughts 0 - - - -  PHQ-9 Score 7 - - - -  Difficult doing work/chores Somewhat difficult - - - -   Interpretation of Total Score  Total Score Depression Severity:  1-4 = Minimal depression, 5-9 = Mild depression, 10-14 = Moderate depression, 15-19 = Moderately severe depression, 20-27 = Severe depression   Psychosocial Evaluation and Intervention:  Psychosocial Evaluation - 09/30/23 1554       Psychosocial Evaluation & Interventions   Interventions Stress management education;Relaxation education;Encouraged to exercise with the program and follow exercise prescription    Comments Patient was referred to CR with TAVR 09/15/23. Her PHQ-9 score was 7 due to lack of energy, overeating and trouble falling asleep. She denies any depression or anxiety. She says her 80 year-old great grandson is undergoing some testing and this is causing her some stress but  otherwise no major stressors. She has a great support system with her 2 sons and daughter-in-laws and 2 sisters and she is very active in her church. She says she knows several people that have participated in CR and she is very motivated to do the program. She does have a $35 co-payment but does not think this will be a problem. Her goals for the program are to be able to work in her garden again; do yardwork; strengthen her core; and get stronger overall; and be able to do more with her great grand-children. She has no barriers identified to complete the program.    Expected Outcomes Short Term: start the program and attend consistently. Long term: meet her personal goals.    Continue Psychosocial Services  Follow up required by staff  Psychosocial Re-Evaluation:   Psychosocial Discharge (Final Psychosocial Re-Evaluation):   Vocational Rehabilitation: Provide vocational rehab assistance to qualifying candidates.   Vocational Rehab Evaluation & Intervention:  Vocational Rehab - 09/30/23 1519       Initial Vocational Rehab Evaluation & Intervention   Assessment shows need for Vocational Rehabilitation No      Vocational Rehab Re-Evaulation   Comments Retired             Education: Education Goals: Education classes will be provided on a weekly basis, covering required topics. Participant will state understanding/return demonstration of topics presented.  Learning Barriers/Preferences:  Learning Barriers/Preferences - 09/30/23 1430       Learning Barriers/Preferences   Learning Barriers None    Learning Preferences Written Material;Audio;Skilled Demonstration             Education Topics: Hypertension, Hypertension Reduction -Define heart disease and high blood pressure. Discus how high blood pressure affects the body and ways to reduce high blood pressure.   Exercise and Your Heart -Discuss why it is important to exercise, the FITT principles of  exercise, normal and abnormal responses to exercise, and how to exercise safely.   Angina -Discuss definition of angina, causes of angina, treatment of angina, and how to decrease risk of having angina.   Cardiac Medications -Review what the following cardiac medications are used for, how they affect the body, and side effects that may occur when taking the medications.  Medications include Aspirin, Beta blockers, calcium channel blockers, ACE Inhibitors, angiotensin receptor blockers, diuretics, digoxin, and antihyperlipidemics.   Congestive Heart Failure -Discuss the definition of CHF, how to live with CHF, the signs and symptoms of CHF, and how keep track of weight and sodium intake.   Heart Disease and Intimacy -Discus the effect sexual activity has on the heart, how changes occur during intimacy as we age, and safety during sexual activity.   Smoking Cessation / COPD -Discuss different methods to quit smoking, the health benefits of quitting smoking, and the definition of COPD.   Nutrition I: Fats -Discuss the types of cholesterol, what cholesterol does to the heart, and how cholesterol levels can be controlled.   Nutrition II: Labels -Discuss the different components of food labels and how to read food label   Heart Parts/Heart Disease and PAD -Discuss the anatomy of the heart, the pathway of blood circulation through the heart, and these are affected by heart disease.   Stress I: Signs and Symptoms -Discuss the causes of stress, how stress may lead to anxiety and depression, and ways to limit stress.   Stress II: Relaxation -Discuss different types of relaxation techniques to limit stress.   Warning Signs of Stroke / TIA -Discuss definition of a stroke, what the signs and symptoms are of a stroke, and how to identify when someone is having stroke.   Knowledge Questionnaire Score:   Core Components/Risk Factors/Patient Goals at Admission:  Personal Goals and  Risk Factors at Admission - 09/30/23 1519       Core Components/Risk Factors/Patient Goals on Admission    Weight Management Weight Maintenance    Improve shortness of breath with ADL's Yes    Intervention Provide education, individualized exercise plan and daily activity instruction to help decrease symptoms of SOB with activities of daily living.    Expected Outcomes Short Term: Improve cardiorespiratory fitness to achieve a reduction of symptoms when performing ADLs;Long Term: Be able to perform more ADLs without symptoms or delay the onset of symptoms  Heart Failure Yes    Intervention Provide a combined exercise and nutrition program that is supplemented with education, support and counseling about heart failure. Directed toward relieving symptoms such as shortness of breath, decreased exercise tolerance, and extremity edema.    Expected Outcomes Improve functional capacity of life;Short term: Attendance in program 2-3 days a week with increased exercise capacity. Reported lower sodium intake. Reported increased fruit and vegetable intake. Reports medication compliance.;Short term: Daily weights obtained and reported for increase. Utilizing diuretic protocols set by physician.;Long term: Adoption of self-care skills and reduction of barriers for early signs and symptoms recognition and intervention leading to self-care maintenance.    Hypertension Yes    Intervention Provide education on lifestyle modifcations including regular physical activity/exercise, weight management, moderate sodium restriction and increased consumption of fresh fruit, vegetables, and low fat dairy, alcohol moderation, and smoking cessation.;Monitor prescription use compliance.    Expected Outcomes Short Term: Continued assessment and intervention until BP is < 140/36mm HG in hypertensive participants. < 130/1mm HG in hypertensive participants with diabetes, heart failure or chronic kidney disease.;Long Term: Maintenance  of blood pressure at goal levels.    Lipids Yes    Intervention Provide education and support for participant on nutrition & aerobic/resistive exercise along with prescribed medications to achieve LDL 70mg , HDL >40mg .    Expected Outcomes Short Term: Participant states understanding of desired cholesterol values and is compliant with medications prescribed. Participant is following exercise prescription and nutrition guidelines.;Long Term: Cholesterol controlled with medications as prescribed, with individualized exercise RX and with personalized nutrition plan. Value goals: LDL < 70mg , HDL > 40 mg.             Core Components/Risk Factors/Patient Goals Review:    Core Components/Risk Factors/Patient Goals at Discharge (Final Review):    ITP Comments:   Comments: Patient arrived for 1st visit/orientation/education at 1400. Patient was referred to CR by Dr. Alverda Skeans due to S/P TAVR. During orientation advised patient on arrival and appointment times what to wear, what to do before, during and after exercise. Reviewed attendance and class policy.  Pt is scheduled to return Cardiac Rehab on 10/05/23 at 1100. Pt was advised to come to class 15 minutes before class starts.  Discussed RPE/Dpysnea scales. Patient participated in warm up stretches. Patient was able to complete 6 minute walk test.  Telemetry:NSR. Patient was measured for the equipment. Discussed equipment safety with patient. Took patient pre-anthropometric measurements. Patient finished visit at 1530.

## 2023-09-30 NOTE — Patient Instructions (Addendum)
Patient Instructions  Patient Details  Name: Dawn Lin MRN: [id number] Date of Birth: 03-23-1941 Referring Provider:  Orbie Pyo, MD  Below are your personal goals for exercise, nutrition, and risk factors. Our goal is to help you stay on track towards obtaining and maintaining these goals. We will be discussing your progress on these goals with you throughout the program.  Initial Exercise Prescription:  Initial Exercise Prescription - 09/30/23 1500       Date of Initial Exercise RX and Referring Provider   Date 09/30/23    Referring Provider Dina Rich MD      Oxygen   Oxygen Continuous    Maintain Oxygen Saturation 88% or higher      Treadmill   MPH 1.2    Grade 0    Minutes 15    METs 1.8      NuStep   Level 1    SPM 50    Minutes 15    METs 1.4      Prescription Details   Frequency (times per week) 3    Duration Progress to 30 minutes of continuous aerobic without signs/symptoms of physical distress      Intensity   THRR 40-80% of Max Heartrate 105-128    Ratings of Perceived Exertion 11-13    Perceived Dyspnea 0-4      Resistance Training   Training Prescription Yes    Weight 3 lbs    Reps 10-15             Exercise Goals: Frequency: Be able to perform aerobic exercise two to three times per week in program working toward 2-5 days per week of home exercise.  Intensity: Work with a perceived exertion of 11 (fairly light) - 15 (hard) while following your exercise prescription.  We will make changes to your prescription with you as you progress through the program.   Duration: Be able to do 30 to 45 minutes of continuous aerobic exercise in addition to a 5 minute warm-up and a 5 minute cool-down routine.   Nutrition Goals: Your personal nutrition goals will be established when you do your nutrition analysis with the dietician.  The following are general nutrition guidelines to follow: Cholesterol < 200mg /day Sodium <  1500mg /day Fiber: Women over 50 yrs - 21 grams per day`  Personal Goals:  Personal Goals and Risk Factors at Admission - 09/30/23 1519       Core Components/Risk Factors/Patient Goals on Admission    Weight Management Weight Maintenance    Improve shortness of breath with ADL's Yes    Intervention Provide education, individualized exercise plan and daily activity instruction to help decrease symptoms of SOB with activities of daily living.    Expected Outcomes Short Term: Improve cardiorespiratory fitness to achieve a reduction of symptoms when performing ADLs;Long Term: Be able to perform more ADLs without symptoms or delay the onset of symptoms    Heart Failure Yes    Intervention Provide a combined exercise and nutrition program that is supplemented with education, support and counseling about heart failure. Directed toward relieving symptoms such as shortness of breath, decreased exercise tolerance, and extremity edema.    Expected Outcomes Improve functional capacity of life;Short term: Attendance in program 2-3 days a week with increased exercise capacity. Reported lower sodium intake. Reported increased fruit and vegetable intake. Reports medication compliance.;Short term: Daily weights obtained and reported for increase. Utilizing diuretic protocols set by physician.;Long term: Adoption of self-care skills and reduction of barriers  for early signs and symptoms recognition and intervention leading to self-care maintenance.    Hypertension Yes    Intervention Provide education on lifestyle modifcations including regular physical activity/exercise, weight management, moderate sodium restriction and increased consumption of fresh fruit, vegetables, and low fat dairy, alcohol moderation, and smoking cessation.;Monitor prescription use compliance.    Expected Outcomes Short Term: Continued assessment and intervention until BP is < 140/97mm HG in hypertensive participants. < 130/58mm HG in  hypertensive participants with diabetes, heart failure or chronic kidney disease.;Long Term: Maintenance of blood pressure at goal levels.    Lipids Yes    Intervention Provide education and support for participant on nutrition & aerobic/resistive exercise along with prescribed medications to achieve LDL 70mg , HDL >40mg .    Expected Outcomes Short Term: Participant states understanding of desired cholesterol values and is compliant with medications prescribed. Participant is following exercise prescription and nutrition guidelines.;Long Term: Cholesterol controlled with medications as prescribed, with individualized exercise RX and with personalized nutrition plan. Value goals: LDL < 70mg , HDL > 40 mg.             Tobacco Use Initial Evaluation: Social History   Tobacco Use  Smoking Status Never  Smokeless Tobacco Never    Exercise Goals and Review:  Exercise Goals     Row Name 09/30/23 1540             Exercise Goals   Increase Physical Activity Yes       Intervention Provide advice, education, support and counseling about physical activity/exercise needs.;Develop an individualized exercise prescription for aerobic and resistive training based on initial evaluation findings, risk stratification, comorbidities and participant's personal goals.       Expected Outcomes Short Term: Attend rehab on a regular basis to increase amount of physical activity.;Long Term: Add in home exercise to make exercise part of routine and to increase amount of physical activity.;Long Term: Exercising regularly at least 3-5 days a week.       Increase Strength and Stamina Yes       Intervention Provide advice, education, support and counseling about physical activity/exercise needs.;Develop an individualized exercise prescription for aerobic and resistive training based on initial evaluation findings, risk stratification, comorbidities and participant's personal goals.       Expected Outcomes Short  Term: Increase workloads from initial exercise prescription for resistance, speed, and METs.;Short Term: Perform resistance training exercises routinely during rehab and add in resistance training at home;Long Term: Improve cardiorespiratory fitness, muscular endurance and strength as measured by increased METs and functional capacity ( )       Able to understand and use rate of perceived exertion (RPE) scale Yes       Intervention Provide education and explanation on how to use RPE scale       Expected Outcomes Short Term: Able to use RPE daily in rehab to express subjective intensity level;Long Term:  Able to use RPE to guide intensity level when exercising independently       Able to understand and use Dyspnea scale Yes       Intervention Provide education and explanation on how to use Dyspnea scale       Expected Outcomes Short Term: Able to use Dyspnea scale daily in rehab to express subjective sense of shortness of breath during exertion;Long Term: Able to use Dyspnea scale to guide intensity level when exercising independently       Knowledge and understanding of Target Heart Rate Range (THRR) Yes  Intervention Provide education and explanation of THRR including how the numbers were predicted and where they are located for reference       Expected Outcomes Short Term: Able to state/look up THRR;Long Term: Able to use THRR to govern intensity when exercising independently;Short Term: Able to use daily as guideline for intensity in rehab       Able to check pulse independently Yes       Intervention Provide education and demonstration on how to check pulse in carotid and radial arteries.;Review the importance of being able to check your own pulse for safety during independent exercise       Expected Outcomes Short Term: Able to explain why pulse checking is important during independent exercise       Understanding of Exercise Prescription Yes       Intervention Provide education,  explanation, and written materials on patient's individual exercise prescription       Expected Outcomes Short Term: Able to explain program exercise prescription;Long Term: Able to explain home exercise prescription to exercise independently                Copy of goals given to participant.

## 2023-10-02 ENCOUNTER — Ambulatory Visit (HOSPITAL_COMMUNITY): Payer: Medicare HMO

## 2023-10-05 ENCOUNTER — Encounter (HOSPITAL_COMMUNITY)
Admission: RE | Admit: 2023-10-05 | Discharge: 2023-10-05 | Disposition: A | Discharge status: Home / Self Care | Payer: Medicare HMO | Source: Ambulatory Visit | Attending: Cardiology

## 2023-10-05 ENCOUNTER — Ambulatory Visit (HOSPITAL_COMMUNITY)
Admission: RE | Admit: 2023-10-05 | Discharge: 2023-10-05 | Disposition: A | Discharge status: Home / Self Care | Payer: Medicare HMO | Source: Ambulatory Visit | Attending: Physician Assistant | Admitting: Physician Assistant

## 2023-10-05 DIAGNOSIS — N838 Other noninflammatory disorders of ovary, fallopian tube and broad ligament: Secondary | ICD-10-CM | POA: Diagnosis not present

## 2023-10-05 DIAGNOSIS — N83209 Unspecified ovarian cyst, unspecified side: Secondary | ICD-10-CM | POA: Insufficient documentation

## 2023-10-05 DIAGNOSIS — Z953 Presence of xenogenic heart valve: Secondary | ICD-10-CM | POA: Diagnosis not present

## 2023-10-05 DIAGNOSIS — N83201 Unspecified ovarian cyst, right side: Secondary | ICD-10-CM | POA: Diagnosis not present

## 2023-10-05 DIAGNOSIS — Z90710 Acquired absence of both cervix and uterus: Secondary | ICD-10-CM | POA: Diagnosis not present

## 2023-10-05 NOTE — Progress Notes (Signed)
Daily Session Note  Patient Details  Name: Dawn Lin MRN: [id number] Date of Birth: 1941-08-07 Referring Provider:   Flowsheet Row CARDIAC REHAB PHASE II ORIENTATION from 09/30/2023 in [facility] CARDIAC REHABILITATION  Referring Provider Dina Rich MD       Encounter Date: 10/05/2023  Check In:  Session Check In - 10/05/23 0925       Check-In   Supervising physician immediately available to respond to emergencies See telemetry face sheet for immediately available MD    Location [facility]    Staff Present Bethanne Ginger, MA, RCEP, CCRP, CCET   Sherrye Payor, RN   Virtual Visit No    Medication changes reported     No    Fall or balance concerns reported    No    Warm-up and Cool-down Performed on first and last piece of equipment    Resistance Training Performed Yes    VAD Patient? No    PAD/SET Patient? No      Pain Assessment   Currently in Pain? No/denies             Capillary Blood Glucose: No results found for this or any previous visit (from the past 24 hours).    Social History   Tobacco Use  Smoking Status Never  Smokeless Tobacco Never    Goals Met:  Exercise tolerated well Personal goals reviewed No report of concerns or symptoms today Strength training completed today  Goals Unmet:  Not Applicable  Comments: First full day of exercise!  Patient was oriented to gym and equipment including functions, settings, policies, and procedures.  Patient's individual exercise prescription and treatment plan were reviewed.  All starting workloads were established based on the results of the 6 minute walk test done at initial orientation visit.  The plan for exercise progression was also introduced and progression will be customized based on patient's performance and goals.

## 2023-10-09 ENCOUNTER — Telehealth: Payer: Self-pay | Admitting: Physician Assistant

## 2023-10-09 ENCOUNTER — Encounter (HOSPITAL_COMMUNITY)
Admission: RE | Admit: 2023-10-09 | Discharge: 2023-10-09 | Disposition: A | Discharge status: Home / Self Care | Payer: Medicare HMO | Source: Ambulatory Visit | Attending: Cardiology | Admitting: Cardiology

## 2023-10-09 DIAGNOSIS — Z953 Presence of xenogenic heart valve: Secondary | ICD-10-CM

## 2023-10-09 NOTE — Progress Notes (Signed)
Daily Session Note  Patient Details  Name: [name] MRN: [id number] Date of Birth: 1941/01/14 Referring Provider:   Flowsheet Row CARDIAC REHAB PHASE II ORIENTATION from 09/30/2023 in [facility] C[facility]AC REHABIL[facility]  Referring Provider Dina Rich MD       Encounter Date: 10/09/2023  Check In:  Session Check In - 10/09/23 1106       Check-In   Supervising physician immediately available to respond to emergencies See telemetry face sheet for immediately available MD    Location [facility]    Staff Present Ross Ludwig, BS, Exercise Physiologist;Lendell Gallick Juanetta Gosling, MA, RCEP, CCRP, CCET;Other   Sherrye Payor, RN   Virtual Visit No    Medication changes reported     No    Fall or balance concerns reported    No    Warm-up and Cool-down Performed on first and last piece of equipment    Resistance Training Performed Yes    VAD Patient? No    PAD/SET Patient? No      Pain Assessment   Currently in Pain? No/denies             Capillary Blood Glucose: No results found for this or any previous visit (from the past 24 hours).    Social History   Tobacco Use  Smoking Status Never  Smokeless Tobacco Never    Goals Met:  Exercise tolerated well No report of concerns or symptoms today Strength training completed today  Goals Unmet:  Not Applicable  Comments: Pt able to follow exercise prescription today without complaint.  Will continue to monitor for progression.

## 2023-10-09 NOTE — Telephone Encounter (Signed)
Entered in error

## 2023-10-12 ENCOUNTER — Encounter (HOSPITAL_COMMUNITY)
Admission: RE | Admit: 2023-10-12 | Discharge: 2023-10-12 | Disposition: A | Discharge status: Home / Self Care | Payer: Medicare HMO | Source: Ambulatory Visit | Attending: Cardiology | Admitting: Cardiology

## 2023-10-12 DIAGNOSIS — Z953 Presence of xenogenic heart valve: Secondary | ICD-10-CM | POA: Diagnosis not present

## 2023-10-12 NOTE — Progress Notes (Signed)
Daily Session Note  Patient Details  Name: Briyana Seeburger MRN: [id number] Date of Birth: May 25, 1941 Referring Provider:   Flowsheet Row CARDIAC REHAB PHASE II ORIENTATION from 09/30/2023 in [facility] CARDIAC REHABILITATION  Referring Provider Dina Rich MD       Encounter Date: 10/12/2023  Check In:  Session Check In - 10/12/23 1108       Check-In   Supervising physician immediately available to respond to emergencies See telemetry face sheet for immediately available MD    Location [facility]    Staff Present Fabio Pierce, MA, RCEP, CCRP, CCET;Heather Fredric Mare, [place], Exercise Physiologist;Other   Sherrye Payor, RN   Virtual Visit No    Medication changes reported     No    Fall or balance concerns reported    No    Warm-up and Cool-down Performed on first and last piece of equipment    Resistance Training Performed Yes    VAD Patient? No    PAD/SET Patient? No      Pain Assessment   Currently in Pain? No/denies             Capillary Blood Glucose: No results found for this or any previous visit (from the past 24 hours).    Social History   Tobacco Use  Smoking Status Never  Smokeless Tobacco Never    Goals Met:  Exercise tolerated well No report of concerns or symptoms today Strength training completed today  Goals Unmet:  Not Applicable  Comments: Pt able to follow exercise prescription today without complaint.  Will continue to monitor for progression.

## 2023-10-13 ENCOUNTER — Encounter (HOSPITAL_COMMUNITY): Payer: Self-pay | Admitting: *Deleted

## 2023-10-13 DIAGNOSIS — Z953 Presence of xenogenic heart valve: Secondary | ICD-10-CM

## 2023-10-13 NOTE — Progress Notes (Signed)
 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 (Patient taking differently: Take 500-1,000 mg by mouth every 6 (six) hours as needed for moderate pain (pain score 4-6).), Disp: 30 tablet, Rfl: 0   Alpha-D-Galactosidase (BEANO PO), Take 2 capsules by mouth daily as needed (bloating)., Disp: , Rfl:    amLODipine  (NORVASC ) 10 MG tablet, Take 10 mg by mouth daily., Disp: , Rfl:    Ascorbic Acid (VITAMIN C PO), Take 564 mg by mouth daily. 282 mg each, Disp: , Rfl:    aspirin  EC 81 MG tablet, Take 81 mg by mouth daily., Disp: , Rfl:    azithromycin  (ZITHROMAX ) 500 MG tablet, Take 1 tablet (500 mg total) by mouth as directed. Take one tablet 1 hour before any dental work including cleanings., Disp: 6 tablet, Rfl: 12   Calcium  Carb-Cholecalciferol (CALCIUM  500 + D PO), Take 1 tablet by mouth 2 (two) times daily., Disp: , Rfl:    cyanocobalamin  (VITAMIN B12) 1000 MCG tablet, Take 1,000 mcg by mouth daily. Sublingual, Disp: , Rfl:    diclofenac Sodium (VOLTAREN ARTHRITIS PAIN) 1 % GEL, Apply 2 g topically daily as needed (Back pain)., Disp: , Rfl:    fexofenadine  (ALLEGRA ) 180 MG tablet, Take 1 tablet (180 mg total) by mouth daily as needed for allergies. (Patient taking differently: Take 180 mg by mouth daily.), Disp: 90  tablet, Rfl: 3   folic acid  (FOLVITE ) 1 MG tablet, Take 2 mg by mouth daily., Disp: , Rfl:    furosemide  (LASIX ) 20 MG tablet, Take 20 mg by mouth daily as needed (swelling)., Disp: , Rfl:    guaifenesin  (HUMIBID E) 400 MG TABS tablet, Take 400-1,200 mg by mouth daily as needed (Drainage)., Disp: , Rfl:    HYDROcodone  bit-homatropine (HYCODAN) 5-1.5 MG/5ML syrup, Take 5 mLs by mouth at bedtime as needed for cough., Disp: , Rfl:    ibuprofen (ADVIL) 200 MG tablet, Take 200-400 mg by mouth every 6 (six) hours as needed for moderate pain (pain score 4-6)., Disp: , Rfl:    ipratropium (ATROVENT ) 0.03 % nasal spray, Place 1 spray into both nostrils 3 (three) times daily as needed for rhinitis. (Patient taking differently: Place 1 spray into both nostrils daily.), Disp: 90 mL, Rfl: 3   losartan  (COZAAR ) 25 MG tablet, Take 0.5 tablets (12.5 mg total) by mouth daily., Disp: 45 tablet, Rfl: 3   meclizine  (ANTIVERT ) 12.5 MG tablet, Take 12.5 mg by mouth every 6 (six) hours as needed for dizziness., Disp: , Rfl:    Multiple Vitamin (MULTIVITAMIN WITH MINERALS) TABS tablet, Take 2 tablets by mouth daily. Gummy, Disp: , Rfl:    potassium chloride  SA (KLOR-CON  M) 20 MEQ tablet, Take 1 tablet (20 mEq total) by mouth as needed (when taking the lasix  for swelling)., Disp: 90 tablet, Rfl: 3   predniSONE  (DELTASONE ) 5 MG tablet, Take 5  mg by mouth daily with breakfast., Disp: , Rfl:    Propylene Glycol, PF, (SYSTANE COMPLETE PF) 0.6 % SOLN, Place 1 drop into both eyes daily as needed (Dry eyes)., Disp: , Rfl:    rosuvastatin  (CRESTOR ) 5 MG tablet, TAKE ONE TABLET BY MOUTH EVERY MORNING, Disp: 90 tablet, Rfl: 2   simethicone  (MYLICON) 80 MG chewable tablet, Chew 160 mg by mouth every 6 (six) hours as needed for flatulence., Disp: , Rfl:    Zinc 30 MG CAPS, Take 30 mg by mouth 2 (two) times daily., Disp: , Rfl:   Past Medical History: Past Medical History:  Diagnosis Date   Anemia    Anxiety    Essential  hypertension, benign since the 1900's   Family history of adverse reaction to anesthesia    Children - N/V   History of blood transfusion    History of lung cancer    Lung adenocarcinoma s/p resection 2018   Hyperlipidemia    Other osteoporosis    Other thalassemia (HCC)    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 Wendel and Dr. Maryjane   Severe aortic stenosis     Tobacco Use: Social History   Tobacco Use  Smoking Status Never  Smokeless Tobacco Never    Labs: Review Flowsheet  More data exists      Latest Ref Rng & Units 04/21/2017 11/13/2017 02/18/2018 08/06/2023 09/15/2023  Labs for ITP Cardiac and Pulmonary Rehab  Cholestrol 100 - 199 mg/dL - - 831  - -  LDL (calc) 0 - 99 mg/dL - - 68  - -  Direct LDL 0 - 99 mg/dL - 862  - - -  HDL-C >60 mg/dL - - 90  - -  Trlycerides 0 - 149 mg/dL - - 50  - -  PH, Arterial 7.35 - 7.45 7.405  - - 7.378  -  PCO2 arterial 32 - 48 mmHg 40.9  - - 40.7  -  Bicarbonate 20.0 - 28.0 mmol/L 25.1  - - 23.8  23.9  -  TCO2 22 - 32 mmol/L - - - 25  25  24    Acid-base deficit 0.0 - 2.0 mmol/L - - - 2.0  1.0  -  O2 Saturation % 95.3  - - 71  93  -    Details       Multiple values from one day are sorted in reverse-chronological order         Capillary Blood Glucose: Lab Results  Component Value Date   GLUCAP 88 04/27/2017   GLUCAP 133 (H) 04/26/2017   GLUCAP 133 (H) 04/22/2017   GLUCAP 122 (H) 04/22/2017   GLUCAP 126 (H) 04/21/2017     Exercise Target Goals: Exercise Program Goal: Individual exercise prescription set using results from initial 6 min walk test and THRR while considering  patient's activity barriers and safety.   Exercise Prescription Goal: Starting with aerobic activity 30 plus minutes a day, 3 days per week for initial exercise prescription. Provide home exercise prescription and guidelines that participant acknowledges understanding prior  to discharge.  Activity Barriers & Risk Stratification:  Activity Barriers & Cardiac Risk Stratification - 09/30/23 1419       Activity Barriers & Cardiac Risk Stratification   Activity Barriers Arthritis;Back Problems;Neck/Spine Problems;Deconditioning;Balance Concerns;History of Falls;Shortness of Breath    Cardiac Risk Stratification Moderate  6 Minute Walk:  6 Minute Walk     Row Name 09/30/23 1534         6 Minute Walk   Phase Initial     Distance 840 feet     Walk Time 6 minutes     # of Rest Breaks 1  25 second sitting break     MPH 1.6     METS 1.14     RPE 12     Perceived Dyspnea  1     VO2 Peak 4.93     Symptoms No     Resting HR 82 bpm     Resting BP 140/60     Resting Oxygen Saturation  97 %     Exercise Oxygen Saturation  during 6 min walk 95 %     Max Ex. HR 91 bpm     Max Ex. BP 146/60     2 Minute Post BP 136/60              Oxygen Initial Assessment:   Oxygen Re-Evaluation:   Oxygen Discharge (Final Oxygen Re-Evaluation):   Initial Exercise Prescription:  Initial Exercise Prescription - 09/30/23 1500       Date of Initial Exercise RX and Referring Provider   Date 09/30/23    Referring Provider Alvan Carrier MD      Oxygen   Oxygen Continuous    Maintain Oxygen Saturation 88% or higher      Treadmill   MPH 1.2    Grade 0    Minutes 15    METs 1.8      NuStep   Level 1    SPM 50    Minutes 15    METs 1.4      Prescription Details   Frequency (times per week) 3    Duration Progress to 30 minutes of continuous aerobic without signs/symptoms of physical distress      Intensity   THRR 40-80% of Max Heartrate 105-128    Ratings of Perceived Exertion 11-13    Perceived Dyspnea 0-4      Resistance Training   Training Prescription Yes    Weight 3 lbs    Reps 10-15             Perform Capillary Blood Glucose checks as needed.  Exercise Prescription Changes:   Exercise Prescription Changes      Row Name 09/30/23 1500             Response to Exercise   Blood Pressure (Admit) 140/60       Blood Pressure (Exercise) 146/60       Blood Pressure (Exit) 136/60       Heart Rate (Admit) 82 bpm       Heart Rate (Exercise) 91 bpm       Heart Rate (Exit) 85 bpm       Oxygen Saturation (Admit) 97 %       Oxygen Saturation (Exercise) 95 %       Oxygen Saturation (Exit) 95 %       Rating of Perceived Exertion (Exercise) 12       Perceived Dyspnea (Exercise) 0       Symptoms R hip pain 5/10       Duration Progress to 30 minutes of  aerobic without signs/symptoms of physical distress       Intensity THRR unchanged                Exercise Comments:  Exercise Comments     Row Name 10/05/23 [phone]           Exercise Comments First full day of exercise!  Patient was oriented to gym and equipment including functions, settings, policies, and procedures.  Patient's individual exercise prescription and treatment plan were reviewed.  All starting workloads were established based on the results of the 6 minute walk test done at initial orientation visit.  The plan for exercise progression was also introduced and progression will be customized based on patient's performance and goals.                Exercise Goals and Review:   Exercise Goals     Row Name 09/30/23 1540             Exercise Goals   Increase Physical Activity Yes       Intervention Provide advice, education, support and counseling about physical activity/exercise needs.;Develop an individualized exercise prescription for aerobic and resistive training based on initial evaluation findings, risk stratification, comorbidities and participant's personal goals.       Expected Outcomes Short Term: Attend rehab on a regular basis to increase amount of physical activity.;Long Term: Add in home exercise to make exercise part of routine and to increase amount of physical activity.;Long Term: Exercising regularly at  least 3-5 days a week.       Increase Strength and Stamina Yes       Intervention Provide advice, education, support and counseling about physical activity/exercise needs.;Develop an individualized exercise prescription for aerobic and resistive training based on initial evaluation findings, risk stratification, comorbidities and participant's personal goals.       Expected Outcomes Short Term: Increase workloads from initial exercise prescription for resistance, speed, and METs.;Short Term: Perform resistance training exercises routinely during rehab and add in resistance training at home;Long Term: Improve cardiorespiratory fitness, muscular endurance and strength as measured by increased METs and functional capacity ( )       Able to understand and use rate of perceived exertion (RPE) scale Yes       Intervention Provide education and explanation on how to use RPE scale       Expected Outcomes Short Term: Able to use RPE daily in rehab to express subjective intensity level;Long Term:  Able to use RPE to guide intensity level when exercising independently       Able to understand and use Dyspnea scale Yes       Intervention Provide education and explanation on how to use Dyspnea scale       Expected Outcomes Short Term: Able to use Dyspnea scale daily in rehab to express subjective sense of shortness of breath during exertion;Long Term: Able to use Dyspnea scale to guide intensity level when exercising independently       Knowledge and understanding of Target Heart Rate Range (THRR) Yes       Intervention Provide education and explanation of THRR including how the numbers were predicted and where they are located for reference       Expected Outcomes Short Term: Able to state/look up THRR;Long Term: Able to use THRR to govern intensity when exercising independently;Short Term: Able to use daily as guideline for intensity in rehab       Able to check pulse independently Yes       Intervention  Provide education and demonstration on how to check pulse in carotid and radial arteries.;Review the importance of being able to check your own pulse for safety  during independent exercise       Expected Outcomes Short Term: Able to explain why pulse checking is important during independent exercise       Understanding of Exercise Prescription Yes       Intervention Provide education, explanation, and written materials on patient's individual exercise prescription       Expected Outcomes Short Term: Able to explain program exercise prescription;Long Term: Able to explain home exercise prescription to exercise independently                Exercise Goals Re-Evaluation :  Exercise Goals Re-Evaluation     Row Name 10/05/23 0926             Exercise Goal Re-Evaluation   Exercise Goals Review Increase Physical Activity;Increase Strength and Stamina;Able to understand and use rate of perceived exertion (RPE) scale;Able to understand and use Dyspnea scale;Knowledge and understanding of Target Heart Rate Range (THRR);Understanding of Exercise Prescription;Able to check pulse independently       Comments Reviewed RPE and dyspnea scale, THR and program prescription with pt today.  Pt voiced understanding and was given a copy of goals to take home.       Expected Outcomes Short: Use RPE daily to regulate intensity.  Long: Follow program prescription in THR.                 Discharge Exercise Prescription (Final Exercise Prescription Changes):  Exercise Prescription Changes - 09/30/23 1500       Response to Exercise   Blood Pressure (Admit) 140/60    Blood Pressure (Exercise) 146/60    Blood Pressure (Exit) 136/60    Heart Rate (Admit) 82 bpm    Heart Rate (Exercise) 91 bpm    Heart Rate (Exit) 85 bpm    Oxygen Saturation (Admit) 97 %    Oxygen Saturation (Exercise) 95 %    Oxygen Saturation (Exit) 95 %    Rating of Perceived Exertion (Exercise) 12    Perceived Dyspnea (Exercise) 0     Symptoms R hip pain 5/10    Duration Progress to 30 minutes of  aerobic without signs/symptoms of physical distress    Intensity THRR unchanged             Nutrition:  Target Goals: Understanding of nutrition guidelines, daily intake of sodium 1500mg , cholesterol 200mg , calories 30% from fat and 7% or less from saturated fats, daily to have 5 or more servings of fruits and vegetables.  Biometrics:  Pre Biometrics - 09/30/23 1540       Pre Biometrics   Height 4' 11 (1.499 m)    Weight 129 lb 10.1 oz (58.8 kg)    Waist Circumference 34 inches    Hip Circumference 40 inches    Waist to Hip Ratio 0.85 %    BMI (Calculated) 26.17    Grip Strength 18.9 kg    Single Leg Stand 2 seconds              Nutrition Therapy Plan and Nutrition Goals:   Nutrition Assessments:  MEDIFICTS Score Key: >=70 Need to make dietary changes  40-70 Heart Healthy Diet <= 40 Therapeutic Level Cholesterol Diet  Flowsheet Row CARDIAC REHAB PHASE II ORIENTATION from 09/30/2023 in [facility] CARDIAC REHABILITATION  Picture Your Plate Total Score on Admission 38      Picture Your Plate Scores: <59 Unhealthy dietary pattern with much room for improvement. 41-50 Dietary pattern unlikely to meet recommendations for good health and room  for improvement. 51-60 More healthful dietary pattern, with some room for improvement.  >60 Healthy dietary pattern, although there may be some specific behaviors that could be improved.    Nutrition Goals Re-Evaluation:   Nutrition Goals Discharge (Final Nutrition Goals Re-Evaluation):   Psychosocial: Target Goals: Acknowledge presence or absence of significant depression and/or stress, maximize coping skills, provide positive support system. Participant is able to verbalize types and ability to use techniques and skills needed for reducing stress and depression.  Initial Review & Psychosocial Screening:  Initial Psych Review & Screening - 09/30/23  1520       Initial Review   Current issues with None Identified      Family Dynamics   Good Support System? Yes    Comments Sisters, 2 sons and daughter-in-laws and chruch family.      Barriers   Psychosocial barriers to participate in program The patient should benefit from training in stress management and relaxation.;There are no identifiable barriers or psychosocial needs.      Screening Interventions   Interventions Encouraged to exercise;To provide support and resources with identified psychosocial needs;Provide feedback about the scores to participant    Expected Outcomes Long Term Goal: Stressors or current issues are controlled or eliminated.;Short Term goal: Utilizing psychosocial counselor, staff and physician to assist with identification of specific Stressors or current issues interfering with healing process. Setting desired goal for each stressor or current issue identified.;Short Term goal: Identification and review with participant of any Quality of Life or Depression concerns found by scoring the questionnaire.;Long Term goal: The participant improves quality of Life and PHQ9 Scores as seen by post scores and/or verbalization of changes             Quality of Life Scores:  Quality of Life - 09/30/23 1543       Quality of Life   Select Quality of Life      Quality of Life Scores   Health/Function Pre 19.32 %    Socioeconomic Pre 24.75 %    Psych/Spiritual Pre 21.07 %    Family Pre 21 %    GLOBAL Pre 20.98 %            Scores of 19 and below usually indicate a poorer quality of life in these areas.  A difference of  2-3 points is a clinically meaningful difference.  A difference of 2-3 points in the total score of the Quality of Life Index has been associated with significant improvement in overall quality of life, self-image, physical symptoms, and general health in studies assessing change in quality of life.  PHQ-9: Review Flowsheet  More data exists       09/30/2023 11/28/2011 10/31/2011 07/28/2011 06/10/2011  Depression screen PHQ 2/9  Decreased Interest 1 0 0 0 0  Down, Depressed, Hopeless 0 0 0 1 0  PHQ - 2 Score 1 0 0 1 0  Altered sleeping 1 - - - -  Tired, decreased energy 2 - - - -  Change in appetite 2 - - - -  Feeling bad or failure about yourself  0 - - - -  Trouble concentrating 1 - - - -  Moving slowly or fidgety/restless 0 - - - -  Suicidal thoughts 0 - - - -  PHQ-9 Score 7 - - - -  Difficult doing work/chores Somewhat difficult - - - -   Interpretation of Total Score  Total Score Depression Severity:  1-4 = Minimal depression, 5-9 = Mild depression,  10-14 = Moderate depression, 15-19 = Moderately severe depression, 20-27 = Severe depression   Psychosocial Evaluation and Intervention:  Psychosocial Evaluation - 09/30/23 1554       Psychosocial Evaluation & Interventions   Interventions Stress management education;Relaxation education;Encouraged to exercise with the program and follow exercise prescription    Comments Patient was referred to CR with TAVR 09/15/23. Her PHQ-9 score was 7 due to lack of energy, overeating and trouble falling asleep. She denies any depression or anxiety. She says her 64 year-old great grandson is undergoing some testing and this is causing her some stress but otherwise no major stressors. She has a great support system with her 2 sons and daughter-in-laws and 2 sisters and she is very active in her church. She says she knows several people that have participated in CR and she is very motivated to do the program. She does have a $35 co-payment but does not think this will be a problem. Her goals for the program are to be able to work in her garden again; do yardwork; strengthen her core; and get stronger overall; and be able to do more with her great grand-children. She has no barriers identified to complete the program.    Expected Outcomes Short Term: start the program and attend consistently.  Long term: meet her personal goals.    Continue Psychosocial Services  Follow up required by staff             Psychosocial Re-Evaluation:   Psychosocial Discharge (Final Psychosocial Re-Evaluation):   Vocational Rehabilitation: Provide vocational rehab assistance to qualifying candidates.   Vocational Rehab Evaluation & Intervention:  Vocational Rehab - 09/30/23 1519       Initial Vocational Rehab Evaluation & Intervention   Assessment shows need for Vocational Rehabilitation No      Vocational Rehab Re-Evaulation   Comments Retired             Education: Education Goals: Education classes will be provided on a weekly basis, covering required topics. Participant will state understanding/return demonstration of topics presented.  Learning Barriers/Preferences:  Learning Barriers/Preferences - 09/30/23 1430       Learning Barriers/Preferences   Learning Barriers None    Learning Preferences Written Material;Audio;Skilled Demonstration             Education Topics: Hypertension, Hypertension Reduction -Define heart disease and high blood pressure. Discus how high blood pressure affects the body and ways to reduce high blood pressure.   Exercise and Your Heart -Discuss why it is important to exercise, the FITT principles of exercise, normal and abnormal responses to exercise, and how to exercise safely.   Angina -Discuss definition of angina, causes of angina, treatment of angina, and how to decrease risk of having angina.   Cardiac Medications -Review what the following cardiac medications are used for, how they affect the body, and side effects that may occur when taking the medications.  Medications include Aspirin , Beta blockers, calcium  channel blockers, ACE Inhibitors, angiotensin receptor blockers, diuretics, digoxin, and antihyperlipidemics.   Congestive Heart Failure -Discuss the definition of CHF, how to live with CHF, the signs and symptoms  of CHF, and how keep track of weight and sodium intake.   Heart Disease and Intimacy -Discus the effect sexual activity has on the heart, how changes occur during intimacy as we age, and safety during sexual activity.   Smoking Cessation / COPD -Discuss different methods to quit smoking, the health benefits of quitting smoking, and the definition of COPD.  Nutrition I: Fats -Discuss the types of cholesterol, what cholesterol does to the heart, and how cholesterol levels can be controlled.   Nutrition II: Labels -Discuss the different components of food labels and how to read food label   Heart Parts/Heart Disease and PAD -Discuss the anatomy of the heart, the pathway of blood circulation through the heart, and these are affected by heart disease.   Stress I: Signs and Symptoms -Discuss the causes of stress, how stress may lead to anxiety and depression, and ways to limit stress.   Stress II: Relaxation -Discuss different types of relaxation techniques to limit stress.   Warning Signs of Stroke / TIA -Discuss definition of a stroke, what the signs and symptoms are of a stroke, and how to identify when someone is having stroke.   Knowledge Questionnaire Score:  Knowledge Questionnaire Score - 10/05/23 0948       Knowledge Questionnaire Score   Pre Score 22/24             Core Components/Risk Factors/Patient Goals at Admission:  Personal Goals and Risk Factors at Admission - 09/30/23 1519       Core Components/Risk Factors/Patient Goals on Admission    Weight Management Weight Maintenance    Improve shortness of breath with ADL's Yes    Intervention Provide education, individualized exercise plan and daily activity instruction to help decrease symptoms of SOB with activities of daily living.    Expected Outcomes Short Term: Improve cardiorespiratory fitness to achieve a reduction of symptoms when performing ADLs;Long Term: Be able to perform more ADLs without  symptoms or delay the onset of symptoms    Heart Failure Yes    Intervention Provide a combined exercise and nutrition program that is supplemented with education, support and counseling about heart failure. Directed toward relieving symptoms such as shortness of breath, decreased exercise tolerance, and extremity edema.    Expected Outcomes Improve functional capacity of life;Short term: Attendance in program 2-3 days a week with increased exercise capacity. Reported lower sodium intake. Reported increased fruit and vegetable intake. Reports medication compliance.;Short term: Daily weights obtained and reported for increase. Utilizing diuretic protocols set by physician.;Long term: Adoption of self-care skills and reduction of barriers for early signs and symptoms recognition and intervention leading to self-care maintenance.    Hypertension Yes    Intervention Provide education on lifestyle modifcations including regular physical activity/exercise, weight management, moderate sodium restriction and increased consumption of fresh fruit, vegetables, and low fat dairy, alcohol  moderation, and smoking cessation.;Monitor prescription use compliance.    Expected Outcomes Short Term: Continued assessment and intervention until BP is < 140/35mm HG in hypertensive participants. < 130/99mm HG in hypertensive participants with diabetes, heart failure or chronic kidney disease.;Long Term: Maintenance of blood pressure at goal levels.    Lipids Yes    Intervention Provide education and support for participant on nutrition & aerobic/resistive exercise along with prescribed medications to achieve LDL 70mg , HDL >40mg .    Expected Outcomes Short Term: Participant states understanding of desired cholesterol values and is compliant with medications prescribed. Participant is following exercise prescription and nutrition guidelines.;Long Term: Cholesterol controlled with medications as prescribed, with individualized  exercise RX and with personalized nutrition plan. Value goals: LDL < 70mg , HDL > 40 mg.             Core Components/Risk Factors/Patient Goals Review:    Core Components/Risk Factors/Patient Goals at Discharge (Final Review):    ITP Comments:  ITP Comments  Row Name 10/05/23 9073 10/13/23 0805         ITP Comments First full day of exercise!  Patient was oriented to gym and equipment including functions, settings, policies, and procedures.  Patient's individual exercise prescription and treatment plan were reviewed.  All starting workloads were established based on the results of the 6 minute walk test done at initial orientation visit.  The plan for exercise progression was also introduced and progression will be customized based on patient's performance and goals. 30 day review completed. ITP sent to Dr. Dorn Ross, Medical Director of Cardiac Rehab. Continue with ITP unless changes are made by physician.  Still new to program.               Comments: 30 day review

## 2023-10-16 ENCOUNTER — Ambulatory Visit: Payer: Medicare HMO

## 2023-10-16 ENCOUNTER — Encounter (HOSPITAL_COMMUNITY)
Admission: RE | Admit: 2023-10-16 | Discharge: 2023-10-16 | Disposition: A | Discharge status: Home / Self Care | Payer: Medicare HMO | Source: Ambulatory Visit | Attending: Cardiology | Admitting: Cardiology

## 2023-10-16 ENCOUNTER — Other Ambulatory Visit (HOSPITAL_COMMUNITY): Payer: Medicare HMO

## 2023-10-16 DIAGNOSIS — Z953 Presence of xenogenic heart valve: Secondary | ICD-10-CM | POA: Insufficient documentation

## 2023-10-16 NOTE — Progress Notes (Signed)
 Daily Session Note  Patient Details  Name: Dawn Lin MRN: [id number] Date of Birth: 1941/06/08 Referring Provider:   Flowsheet Row CARDIAC REHAB PHASE II ORIENTATION from 09/30/2023 in [facility] CARDIAC REHABILITATION  Referring Provider Alvan Carrier MD       Encounter Date: 10/16/2023  Check In:  Session Check In - 10/16/23 1112       Check-In   Supervising physician immediately available to respond to emergencies See telemetry face sheet for immediately available MD    Location [facility]    Staff Present Powell Benders, BS, Exercise Physiologist;Debra Vicci, RN, Randye Gelineau, MA, RCEP, CCRP, CCET    Virtual Visit No    Medication changes reported     No    Fall or balance concerns reported    No    Warm-up and Cool-down Performed on first and last piece of equipment    Resistance Training Performed Yes    VAD Patient? No    PAD/SET Patient? No      Pain Assessment   Currently in Pain? No/denies             Capillary Blood Glucose: No results found for this or any previous visit (from the past 24 hours).    Social History   Tobacco Use  Smoking Status Never  Smokeless Tobacco Never    Goals Met:  Independence with exercise equipment Exercise tolerated well No report of concerns or symptoms today Strength training completed today  Goals Unmet:  Not Applicable  Comments: Pt able to follow exercise prescription today without complaint.  Will continue to monitor for progression.

## 2023-10-19 ENCOUNTER — Encounter (HOSPITAL_COMMUNITY): Payer: Medicare HMO

## 2023-10-21 ENCOUNTER — Encounter (HOSPITAL_COMMUNITY)
Admission: RE | Admit: 2023-10-21 | Discharge: 2023-10-21 | Disposition: A | Discharge status: Home / Self Care | Payer: Medicare HMO | Source: Ambulatory Visit | Attending: Cardiology | Admitting: Cardiology

## 2023-10-21 DIAGNOSIS — Z953 Presence of xenogenic heart valve: Secondary | ICD-10-CM | POA: Diagnosis not present

## 2023-10-21 NOTE — Progress Notes (Signed)
 Daily Session Note  Patient Details  Name: Dawn Lin MRN: [id number] Date of Birth: Feb 15, 1941 Referring Provider:   Flowsheet Row CARDIAC REHAB PHASE II ORIENTATION from 09/30/2023 in [facility] CARDIAC REHABILITATION  Referring Provider Alvan Carrier MD       Encounter Date: 10/21/2023  Check In:  Session Check In - 10/21/23 1100       Check-In   Supervising physician immediately available to respond to emergencies See telemetry face sheet for immediately available MD    Location [facility]    Staff Present Powell Benders, BS, Exercise Physiologist;Jessica Calico Rock, MA, RCEP, CCRP, CCET   [place], ED [facility]   Virtual Visit No    Medication changes reported     No    Fall or balance concerns reported    No    Tobacco Cessation No Change    Warm-up and Cool-down Performed on first and last piece of equipment    Resistance Training Performed Yes    VAD Patient? No    PAD/SET Patient? No      Pain Assessment   Currently in Pain? No/denies    Pain Score 0-No pain    Multiple Pain Sites No             Capillary Blood Glucose: No results found for this or any previous visit (from the past 24 hours).    Social History   Tobacco Use  Smoking Status Never  Smokeless Tobacco Never    Goals Met:  Independence with exercise equipment Exercise tolerated well No report of concerns or symptoms today Strength training completed today  Goals Unmet:  Not Applicable  Comments: Pt able to follow exercise prescription today without complaint.  Will continue to monitor for progression.

## 2023-10-23 ENCOUNTER — Encounter (HOSPITAL_COMMUNITY)
Admission: RE | Admit: 2023-10-23 | Discharge: 2023-10-23 | Disposition: A | Discharge status: Home / Self Care | Payer: Medicare HMO | Source: Ambulatory Visit | Attending: Cardiology | Admitting: Cardiology

## 2023-10-23 DIAGNOSIS — Z953 Presence of xenogenic heart valve: Secondary | ICD-10-CM | POA: Diagnosis not present

## 2023-10-23 NOTE — Progress Notes (Signed)
 Daily Session Note  Patient Details  Name: [name] MRN: [id number] Date of Birth: 05/31/1941 Referring Provider:   Flowsheet Row CARDIAC REHAB PHASE II ORIENTATION from 09/30/2023 in [facility] CARDIAC REHABILITATION  Referring Provider Alvan Carrier MD       Encounter Date: 10/23/2023  Check In:  Session Check In - 10/23/23 1100       Check-In   Supervising physician immediately available to respond to emergencies See telemetry face sheet for immediately available MD    Location [facility]    Staff Present Powell Benders, BS, Exercise Physiologist;Jessica Vonzell, MA, RCEP, CCRP, CCET    Virtual Visit No    Medication changes reported     No    Fall or balance concerns reported    No    Tobacco Cessation No Change    Warm-up and Cool-down Performed on first and last piece of equipment    Resistance Training Performed Yes    VAD Patient? No    PAD/SET Patient? No      Pain Assessment   Currently in Pain? No/denies    Pain Score 0-No pain    Multiple Pain Sites No             Capillary Blood Glucose: No results found for this or any previous visit (from the past 24 hours).    Social History   Tobacco Use  Smoking Status Never  Smokeless Tobacco Never    Goals Met:  Independence with exercise equipment Exercise tolerated well No report of concerns or symptoms today Strength training completed today  Goals Unmet:  Not Applicable  Comments: Pt able to follow exercise prescription today without complaint.  Will continue to monitor for progression.

## 2023-10-26 ENCOUNTER — Encounter (HOSPITAL_COMMUNITY): Payer: Medicare HMO

## 2023-10-27 ENCOUNTER — Encounter (HOSPITAL_COMMUNITY)
Admission: RE | Admit: 2023-10-27 | Discharge: 2023-10-27 | Disposition: A | Discharge status: Home / Self Care | Payer: Medicare HMO | Source: Ambulatory Visit | Attending: Cardiology | Admitting: Cardiology

## 2023-10-27 DIAGNOSIS — Z953 Presence of xenogenic heart valve: Secondary | ICD-10-CM | POA: Diagnosis not present

## 2023-10-27 NOTE — Progress Notes (Addendum)
 Daily Session Note  Patient Details  Name: Dawn Lin MRN: [id number] Date of Birth: 10-16-1940 Referring Provider:   Flowsheet Row CARDIAC REHAB PHASE II ORIENTATION from 09/30/2023 in [facility] CARDIAC REHABILITATION  Referring Provider Alvan Carrier MD       Encounter Date: 10/27/2023  Check In:  Session Check In - 10/27/23 1327       Check-In   Supervising physician immediately available to respond to emergencies See telemetry face sheet for immediately available MD    Location [facility]    Staff Present Powell Benders, BS, Exercise Physiologist;Holmes Hays Vonzell, MA, RCEP, CCRP, CCET;Phyllis Billingsley, RN    Virtual Visit No    Medication changes reported     No    Fall or balance concerns reported    No    Warm-up and Cool-down Performed on first and last piece of equipment    Resistance Training Performed Yes    VAD Patient? No    PAD/SET Patient? No      Pain Assessment   Currently in Pain? No/denies             Capillary Blood Glucose: No results found for this or any previous visit (from the past 24 hours).    Social History   Tobacco Use  Smoking Status Never  Smokeless Tobacco Never    Goals Met:  Proper associated with RPD/PD & O2 Sat Independence with exercise equipment Using PLB without cueing & demonstrates good technique Exercise tolerated well No report of concerns or symptoms today Strength training completed today  Goals Unmet:  Not Applicable  Comments: Pt able to follow exercise prescription today without complaint.  Will continue to monitor for progression.  Cardiac:  Reviewed home exercise with pt today.  Pt plans to walk at church with daughter and friends for exercise.  Reviewed THR, pulse, RPE, sign and symptoms, pulse oximetery and when to call 911 or MD.  Also discussed weather considerations and indoor options.  Pt voiced understanding.

## 2023-10-28 ENCOUNTER — Encounter (HOSPITAL_COMMUNITY)
Admission: RE | Admit: 2023-10-28 | Discharge: 2023-10-28 | Disposition: A | Discharge status: Home / Self Care | Payer: Medicare HMO | Source: Ambulatory Visit | Attending: Cardiology | Admitting: Cardiology

## 2023-10-28 DIAGNOSIS — Z953 Presence of xenogenic heart valve: Secondary | ICD-10-CM

## 2023-10-28 NOTE — Progress Notes (Signed)
 Daily Session Note  Patient Details  Name: Dezerae Furtak MRN: [id number] Date of Birth: [birth year] Referring Provider:   Flowsheet Row CARDIAC REHAB PHASE II ORIENTATION from 09/30/2023 in [facility] CARDIAC REHABILITATION  Referring Provider Armida Lander MD       Encounter Date: 10/28/2023  Check In:  Session Check In - 10/28/23 1030       Check-In   Supervising physician immediately available to respond to emergencies See telemetry face sheet for immediately available MD    Location [facility]    Staff Present Clotilda Danish, BS, Exercise Physiologist;Jessica Zoila Hines, MA, RCEP, CCRP, CCET    Virtual Visit No    Medication changes reported     No    Fall or balance concerns reported    No    Tobacco Cessation No Change    Warm-up and Cool-down Performed on first and last piece of equipment    Resistance Training Performed Yes    VAD Patient? No    PAD/SET Patient? No      Pain Assessment   Currently in Pain? No/denies    Pain Score 0-No pain    Multiple Pain Sites No             Capillary Blood Glucose: No results found for this or any previous visit (from the past 24 hours).    Social History   Tobacco Use  Smoking Status Never  Smokeless Tobacco Never    Goals Met:  Independence with exercise equipment Exercise tolerated well No report of concerns or symptoms today Strength training completed today  Goals Unmet:  Not Applicable  Comments: Pt able to follow exercise prescription today without complaint.  Will continue to monitor for progression.

## 2023-10-30 ENCOUNTER — Encounter (HOSPITAL_COMMUNITY)
Admission: RE | Admit: 2023-10-30 | Discharge: 2023-10-30 | Disposition: A | Discharge status: Home / Self Care | Payer: Medicare HMO | Source: Ambulatory Visit | Attending: Cardiology

## 2023-10-30 DIAGNOSIS — M25562 Pain in left knee: Secondary | ICD-10-CM | POA: Diagnosis not present

## 2023-10-30 DIAGNOSIS — Z953 Presence of xenogenic heart valve: Secondary | ICD-10-CM | POA: Diagnosis not present

## 2023-10-30 DIAGNOSIS — M9903 Segmental and somatic dysfunction of lumbar region: Secondary | ICD-10-CM | POA: Diagnosis not present

## 2023-10-30 DIAGNOSIS — M5442 Lumbago with sciatica, left side: Secondary | ICD-10-CM | POA: Diagnosis not present

## 2023-10-30 DIAGNOSIS — M9905 Segmental and somatic dysfunction of pelvic region: Secondary | ICD-10-CM | POA: Diagnosis not present

## 2023-10-30 DIAGNOSIS — M9902 Segmental and somatic dysfunction of thoracic region: Secondary | ICD-10-CM | POA: Diagnosis not present

## 2023-10-30 NOTE — Progress Notes (Signed)
Daily Session Note  Patient Details  Name: Dawn Lin MRN: [id number] Date of Birth: Feb 12, 1941 Referring Provider:   Flowsheet Row CARDIAC REHAB PHASE II ORIENTATION from 09/30/2023 in [facility] CARDIAC REHABILITATION  Referring Provider Dina Rich MD       Encounter Date: 10/30/2023  Check In:  Session Check In - 10/30/23 1045       Check-In   Supervising physician immediately available to respond to emergencies See telemetry face sheet for immediately available ER MD    Virtual Visit No    Medication changes reported     No    Fall or balance concerns reported    No    Warm-up and Cool-down Performed on first and last piece of equipment    Resistance Training Performed Yes    VAD Patient? No    PAD/SET Patient? No      Pain Assessment   Currently in Pain? No/denies    Pain Score 0-No pain    Multiple Pain Sites No             Capillary Blood Glucose: No results found for this or any previous visit (from the past 24 hours).    Social History   Tobacco Use  Smoking Status Never  Smokeless Tobacco Never    Goals Met:  Independence with exercise equipment Exercise tolerated well No report of concerns or symptoms today Strength training completed today  Goals Unmet:  Not Applicable  Comments: Pt able to follow exercise prescription today without complaint.  Will continue to monitor for progression.

## 2023-11-02 ENCOUNTER — Encounter (HOSPITAL_COMMUNITY)
Admission: RE | Admit: 2023-11-02 | Discharge: 2023-11-02 | Disposition: A | Discharge status: Home / Self Care | Payer: Medicare HMO | Source: Ambulatory Visit | Attending: Cardiology

## 2023-11-02 DIAGNOSIS — Z953 Presence of xenogenic heart valve: Secondary | ICD-10-CM

## 2023-11-02 NOTE — Progress Notes (Signed)
Daily Session Note  Patient Details  Name: Dawn Lin MRN: [id number] Date of Birth: 1941/08/09 Referring Provider:   Flowsheet Row CARDIAC REHAB PHASE II ORIENTATION from 09/30/2023 in [facility] CARDIAC REHABILITATION  Referring Provider Dina Rich MD       Encounter Date: 11/02/2023  Check In:  Session Check In - 11/02/23 1045       Check-In   Supervising physician immediately available to respond to emergencies See telemetry face sheet for immediately available ER MD    Location [facility]    Staff Present Sherrye Payor, RN;Cyerra Yim Laural Benes, RN, BSN;Heather Fredric Mare, BS, Exercise Physiologist    Medication changes reported     No    Fall or balance concerns reported    No    Warm-up and Cool-down Performed on first and last piece of equipment    Resistance Training Performed Yes    VAD Patient? No    PAD/SET Patient? No      Pain Assessment   Currently in Pain? No/denies    Pain Score 0-No pain    Multiple Pain Sites No             Capillary Blood Glucose: No results found for this or any previous visit (from the past 24 hours).    Social History   Tobacco Use  Smoking Status Never  Smokeless Tobacco Never    Goals Met:  Independence with exercise equipment Exercise tolerated well No report of concerns or symptoms today Strength training completed today  Goals Unmet:  Not Applicable  Comments: exercise

## 2023-11-04 ENCOUNTER — Ambulatory Visit (HOSPITAL_COMMUNITY): Payer: Medicare HMO | Attending: Physician Assistant

## 2023-11-04 ENCOUNTER — Encounter (HOSPITAL_COMMUNITY): Payer: Medicare HMO

## 2023-11-04 DIAGNOSIS — Z952 Presence of prosthetic heart valve: Secondary | ICD-10-CM

## 2023-11-04 LAB — ECHOCARDIOGRAM COMPLETE
AR max vel: 1.27 cm2
AV Area VTI: 1.51 cm2
AV Area mean vel: 1.51 cm2
AV Mean grad: 10.7 mm[Hg]
AV Peak grad: 21.8 mm[Hg]
Ao pk vel: 2.33 m/s
Area-P 1/2: 2.33 cm2
S' Lateral: 2.6 cm

## 2023-11-05 NOTE — Progress Notes (Unsigned)
[facility]   MULTIDISCIPLINARY HEART VALVE CLINIC                                     Cardiology Office Note:    Date:  11/07/2023   ID:  Dawn Lin, DOB 1940/12/27, MRN [id number]  PCP:  Ignatius Specking, MD  [facility] Cardiologist:  Dina Rich, MD / Dr. Lynnette Caffey & Dr. Leafy Ro (TAVR)  [facility] Electrophysiologist:  None   Referring MD: Ignatius Specking, MD   1 month s/p TAVR  History of Present Illness:    Dawn Lin is a 83 y.o. 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  She is followed by heme onc for beta thalassemia trait and autoimmune hemolytic anemia. She was treated with rituximab weekly x4. She had relapse of hemolysis when prednisone was discontinued. Currently on prednisone 5 mg daily and folic acid 2 mg daily. She has been followed over time for aortic stenosis. Echo 07/29/23 showed EF 70% and progression to severe AS with a mean grad 40 mmHg, AVA 0.70 cm2, and mild AI. [facility] 08/06/23 showed no CAD and normal filling pressures. S/p TAVR with a 26 mm Medtronic Fx THV via the TF approach on 09/15/23. Post operative echo showed EF 65%, normally functioning TAVR with a mean gradient of 11 mmHg and no PVL. She was discharged on Asprin 81 mg daily.    Echo 11/04/23 showed EF 60%, mild LVH, normally functioning TAVR with a mean gradient of 10.7 mmHg and no PVL.  Today the patient presents to clinic for follow up. Here with her son, Dawn Lin. Working at cardiac rehab and really building stamina. No CP. Does have some SOB with moderate activities with like bringing in groceries, but much improved from previous. No LE edema, orthopnea or PND. No dizziness or syncope. No blood in stool or urine. No palpitations.    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 Lynnette Caffey and Dr. Leafy Ro   Severe aortic stenosis      Current Medications: Current Meds  Medication Sig   acetaminophen (TYLENOL) 500 MG tablet Take 1 tablet (500 mg total) by mouth every 6 (six) hours as needed. (Patient taking differently: Take 500-1,000 mg by mouth every 6 (six) hours as needed for moderate pain (pain score 4-6).)   Alpha-D-Galactosidase (BEANO PO) Take 2 capsules by mouth daily as needed (bloating).   Ascorbic Acid (VITAMIN C PO) Take 564 mg by mouth daily. 282 mg each   aspirin EC 81 MG tablet Take 81 mg by mouth daily.   azithromycin (ZITHROMAX) 500 MG tablet Take 1 tablet (500 mg total) by mouth as directed. Take one tablet 1 hour before any dental work including cleanings.   Calcium Carb-Cholecalciferol (CALCIUM 500 +  D PO) Take 1 tablet by mouth 2 (two) times daily.   cyanocobalamin (VITAMIN B12) 1000 MCG tablet Take 1,000 mcg by mouth daily. Sublingual   diclofenac Sodium (VOLTAREN ARTHRITIS PAIN) 1 % GEL Apply 2 g topically daily as needed (Back pain).   fexofenadine (ALLEGRA) 180 MG tablet Take 1 tablet (180 mg total) by mouth daily as needed for allergies. (Patient taking differently: Take 180 mg by mouth daily.)   folic acid (FOLVITE) 1 MG tablet Take 2 mg by mouth daily.   guaifenesin (HUMIBID E) 400 MG TABS tablet Take 400-1,200 mg by mouth daily as needed (Drainage).   HYDROcodone bit-homatropine (HYCODAN) 5-1.5 MG/5ML syrup Take 5 mLs by mouth at bedtime as needed for cough.   ibuprofen (ADVIL) 200 MG tablet Take 200-400 mg by mouth every 6 (six) hours as needed  for moderate pain (pain score 4-6).   ipratropium (ATROVENT) 0.03 % nasal spray Place 1 spray into both nostrils 3 (three) times daily as needed for rhinitis. (Patient taking differently: Place 1 spray into both nostrils daily.)   meclizine (ANTIVERT) 12.5 MG tablet Take 12.5 mg by mouth every 6 (six) hours as needed for dizziness.   Multiple Vitamin (MULTIVITAMIN WITH MINERALS) TABS tablet Take 2 tablets by mouth daily. Gummy   predniSONE (DELTASONE) 5 MG tablet Take 5 mg by mouth daily with breakfast.   Propylene Glycol, PF, (SYSTANE COMPLETE PF) 0.6 % SOLN Place 1 drop into both eyes daily as needed (Dry eyes).   simethicone (MYLICON) 80 MG chewable tablet Chew 160 mg by mouth every 6 (six) hours as needed for flatulence.   Zinc 30 MG CAPS Take 30 mg by mouth 2 (two) times daily.   [DISCONTINUED] amLODipine (NORVASC) 10 MG tablet Take 10 mg by mouth daily.   [DISCONTINUED] furosemide (LASIX) 20 MG tablet Take 20 mg by mouth daily as needed (swelling).   [DISCONTINUED] potassium chloride SA (KLOR-CON M) 20 MEQ tablet Take 1 tablet (20 mEq total) by mouth as needed (when taking the lasix for swelling).   [DISCONTINUED] rosuvastatin (CRESTOR) 5 MG tablet TAKE ONE TABLET BY MOUTH EVERY MORNING      ROS:   Please see the history of present illness.    All other systems reviewed and are negative.  EKGs       Risk Assessment/Calculations:           Physical Exam:    VS:  BP 124/60   Pulse 68   Ht 4\' 11"  (1.499 m)   Wt 128 lb (58.1 kg)   LMP  (LMP Unknown)   SpO2 98%   BMI 25.85 kg/m     Wt Readings from Last 3 Encounters:  11/06/23 128 lb (58.1 kg)  09/30/23 129 lb 10.1 oz (58.8 kg)  09/25/23 127 lb 6.4 oz (57.8 kg)     GEN: Well nourished, well developed in no acute distress NECK: No JVD CARDIAC: RRR, soft flow murmur @ RUSB. No rubs, gallops RESPIRATORY:  Clear to auscultation without rales, wheezing or rhonchi  ABDOMEN: Soft, non-tender, non-distended EXTREMITIES:   No edema; No deformity.   ASSESSMENT:    1. S/P TAVR (transcatheter aortic valve replacement)   2. Heart failure with preserved ejection fraction, unspecified HF chronicity (HCC)   3. Essential hypertension   4. Mixed hyperlipidemia   5. Other non-autoimmune hemolytic anemias (HCC)   6. Cyst of ovary, unspecified laterality     PLAN:    In order of problems listed above:  Severe AS  s/p TAVR: echo 11/04/23 showed EF 60%, mild LVH, normally functioning TAVR with a mean gradient of 10.7 mmHg and no PVL. NYHA class II symptoms with a marked improvement since TAVR. SBE prophylaxis discussed; she has azithromycin due to a PCN allergy. Continue Asprin 81 mg daily. I will see back for 1 year echo and OV.   HFpEF: appears euvolemic. Continue Lasix 20 mg/KClor 20 meq as needed   HTN: BP well controlled. Continue on amlodipine 10mg  daily and losartan 12.5mg  daily as well as PRN Lasix/Kclor   HLD: continue Crestor 5mg  daily.    Autoimmune hemolytic anemia: followed by heme onc. Continue prednisone 5 mg daily and folic acid 2 mg daily.   Ovarian enlargement and cysts: pre TAVR scans noted "asymmetric enlargement of the right ovary with multiple cysts, recommend further evaluation with pelvic ultrasound." Follow up US on 10/08/24 showed likely benign enlarged cysts. Follow up as clinically warranted. Discussed with pt today and we are not going to arrange any follow up  Medication Adjustments/Labs and Tests Ordered: Current medicines are reviewed at length with the patient today.  Concerns regarding medicines are outlined above.  No orders of the defined types were placed in this encounter.  Meds ordered this encounter  Medications   amLODipine (NORVASC) 10 MG tablet    Sig: Take 1 tablet (10 mg total) by mouth daily.    Dispense:  90 tablet    Refill:  3   furosemide (LASIX) 20 MG tablet    Sig: Take 1 tablet (20 mg total) by mouth daily as needed (swelling).    Dispense:  90 tablet     Refill:  3   losartan (COZAAR) 25 MG tablet    Sig: Take 0.5 tablets (12.5 mg total) by mouth daily.    Dispense:  45 tablet    Refill:  3   potassium chloride SA (KLOR-CON M) 20 MEQ tablet    Sig: Take 1 tablet (20 mEq total) by mouth as needed (when taking the lasix for swelling).    Dispense:  90 tablet    Refill:  3   rosuvastatin (CRESTOR) 5 MG tablet    Sig: Take 1 tablet (5 mg total) by mouth every morning.    Dispense:  90 tablet    Refill:  3    Patient Instructions  Medication Instructions:  Your physician recommends that you continue on your current medications as directed. Please refer to the Current Medication list given to you today.  *If you need a refill on your cardiac medications before your next appointment, please call your pharmacy*   Lab Work: None.  If you have labs (blood work) drawn today and your tests are completely normal, you will receive your results only by: MyChart Message (if you have MyChart) OR A paper copy in the mail If you have any lab test that is abnormal or we need to change your treatment, we will call you to review the results.   Testing/Procedures: None.   Follow-Up: At [facility], you and your health needs are our priority.  As part of our continuing mission to provide you with exceptional heart care, we have created designated Provider Care Teams.  These Care Teams include your primary Cardiologist (physician) and Advanced Practice Providers (APPs -  Physician Assistants and Nurse Practitioners) who all work together to provide you with the care you need, when you need it.  We recommend signing up for the patient portal called "MyChart".  Sign up  information is provided on this After Visit Summary.  MyChart is used to connect with patients for Virtual Visits (Telemedicine).  Patients are able to view lab/test results, encounter notes, upcoming appointments, etc.  Non-urgent messages can be sent to your provider as well.    To learn more about what you can do with MyChart, go to [email or web address].      Other Instructions Please make sure to keep your scheduled follow up appointments.         Signed, Cline Crock, PA-C  11/07/2023 5:37 AM    [facility]

## 2023-11-06 ENCOUNTER — Ambulatory Visit: Payer: Medicare HMO | Attending: Physician Assistant

## 2023-11-06 ENCOUNTER — Encounter (HOSPITAL_COMMUNITY): Payer: Medicare HMO

## 2023-11-06 VITALS — BP 124/60 | HR 68 | Ht 59.0 in | Wt 128.0 lb

## 2023-11-06 DIAGNOSIS — D594 Other nonautoimmune hemolytic anemias: Secondary | ICD-10-CM

## 2023-11-06 DIAGNOSIS — N83209 Unspecified ovarian cyst, unspecified side: Secondary | ICD-10-CM | POA: Diagnosis not present

## 2023-11-06 DIAGNOSIS — E782 Mixed hyperlipidemia: Secondary | ICD-10-CM

## 2023-11-06 DIAGNOSIS — Z952 Presence of prosthetic heart valve: Secondary | ICD-10-CM

## 2023-11-06 DIAGNOSIS — I10 Essential (primary) hypertension: Secondary | ICD-10-CM | POA: Diagnosis not present

## 2023-11-06 DIAGNOSIS — I5030 Unspecified diastolic (congestive) heart failure: Secondary | ICD-10-CM | POA: Diagnosis not present

## 2023-11-06 MED ORDER — ROSUVASTATIN CALCIUM 5 MG PO TABS: 5.0000 mg | ORAL_TABLET | Freq: Every morning | ORAL | 3 refills | Status: AC

## 2023-11-06 MED ORDER — FUROSEMIDE 20 MG PO TABS: 20.0000 mg | ORAL_TABLET | Freq: Every day | ORAL | 3 refills | Status: AC | PRN

## 2023-11-06 MED ORDER — POTASSIUM CHLORIDE CRYS ER 20 MEQ PO TBCR: 20.0000 meq | EXTENDED_RELEASE_TABLET | ORAL | 3 refills | Status: AC | PRN

## 2023-11-06 MED ORDER — LOSARTAN POTASSIUM 25 MG PO TABS: 12.5000 mg | ORAL_TABLET | Freq: Every day | ORAL | 3 refills | Status: AC

## 2023-11-06 MED ORDER — AMLODIPINE BESYLATE 10 MG PO TABS: 10.0000 mg | ORAL_TABLET | Freq: Every day | ORAL | 3 refills | Status: AC

## 2023-11-06 NOTE — Patient Instructions (Signed)
Medication Instructions:  Your physician recommends that you continue on your current medications as directed. Please refer to the Current Medication list given to you today.  *If you need a refill on your cardiac medications before your next appointment, please call your pharmacy*   Lab Work: None.  If you have labs (blood work) drawn today and your tests are completely normal, you will receive your results only by: MyChart Message (if you have MyChart) OR A paper copy in the mail If you have any lab test that is abnormal or we need to change your treatment, we will call you to review the results.   Testing/Procedures: None.   Follow-Up: At [facility], you and your health needs are our priority.  As part of our continuing mission to provide you with exceptional heart care, we have created designated Provider Care Teams.  These Care Teams include your primary Cardiologist (physician) and Advanced Practice Providers (APPs -  Physician Assistants and Nurse Practitioners) who all work together to provide you with the care you need, when you need it.  We recommend signing up for the patient portal called "MyChart".  Sign up information is provided on this After Visit Summary.  MyChart is used to connect with patients for Virtual Visits (Telemedicine).  Patients are able to view lab/test results, encounter notes, upcoming appointments, etc.  Non-urgent messages can be sent to your provider as well.   To learn more about what you can do with MyChart, go to [email or web address].      Other Instructions Please make sure to keep your scheduled follow up appointments.

## 2023-11-09 ENCOUNTER — Encounter (HOSPITAL_COMMUNITY)
Admission: RE | Admit: 2023-11-09 | Discharge: 2023-11-09 | Disposition: A | Discharge status: Home / Self Care | Payer: Medicare HMO | Source: Ambulatory Visit | Attending: Cardiology | Admitting: Cardiology

## 2023-11-09 DIAGNOSIS — Z953 Presence of xenogenic heart valve: Secondary | ICD-10-CM

## 2023-11-09 NOTE — Progress Notes (Signed)
Daily Session Note  Patient Details  Name: Dawn Lin MRN: [id number] Date of Birth: 09-09-1941 Referring Provider:   Flowsheet Row CARDIAC REHAB PHASE II ORIENTATION from 09/30/2023 in [facility] CARDIAC REHABILITATION  Referring Provider Dina Rich MD       Encounter Date: 11/09/2023  Check In:  Session Check In - 11/09/23 1059       Check-In   Supervising physician immediately available to respond to emergencies See telemetry face sheet for immediately available MD    Location [facility]    Staff Present Ross Ludwig, BS, Exercise Physiologist;Brooke Elwyn Reach, RN    Virtual Visit No    Medication changes reported     No    Fall or balance concerns reported    No    Tobacco Cessation No Change    Warm-up and Cool-down Performed on first and last piece of equipment    Resistance Training Performed Yes    VAD Patient? No    PAD/SET Patient? No      Pain Assessment   Currently in Pain? No/denies    Pain Score 0-No pain    Multiple Pain Sites No             Capillary Blood Glucose: No results found for this or any previous visit (from the past 24 hours).    Social History   Tobacco Use  Smoking Status Never  Smokeless Tobacco Never    Goals Met:  Independence with exercise equipment Exercise tolerated well No report of concerns or symptoms today Strength training completed today  Goals Unmet:  Not Applicable  Comments: Pt able to follow exercise prescription today without complaint.  Will continue to monitor for progression.

## 2023-11-11 ENCOUNTER — Encounter (HOSPITAL_COMMUNITY): Payer: Self-pay | Admitting: *Deleted

## 2023-11-11 ENCOUNTER — Encounter (HOSPITAL_COMMUNITY)
Admission: RE | Admit: 2023-11-11 | Discharge: 2023-11-11 | Disposition: A | Discharge status: Home / Self Care | Payer: Medicare HMO | Source: Ambulatory Visit | Attending: Cardiology

## 2023-11-11 DIAGNOSIS — Z953 Presence of xenogenic heart valve: Secondary | ICD-10-CM

## 2023-11-11 NOTE — Progress Notes (Signed)
Daily Session Note  Patient Details  Name: Bettie Capistran MRN: [id number] Date of Birth: 1941-01-11 Referring Provider:   Flowsheet Row CARDIAC REHAB PHASE II ORIENTATION from 09/30/2023 in [facility] CARDIAC REHABILITATION  Referring Provider Dina Rich MD       Encounter Date: 11/11/2023  Check In:  Session Check In - 11/11/23 1030       Check-In   Supervising physician immediately available to respond to emergencies See telemetry face sheet for immediately available MD    Location [facility]    Staff Present Ross Ludwig, BS, Exercise Physiologist;Jessica Juanetta Gosling, MA, RCEP, CCRP, CCET    Virtual Visit No    Medication changes reported     No    Fall or balance concerns reported    No    Tobacco Cessation No Change    Warm-up and Cool-down Performed on first and last piece of equipment    Resistance Training Performed Yes    VAD Patient? No    PAD/SET Patient? No      Pain Assessment   Currently in Pain? No/denies    Pain Score 0-No pain    Multiple Pain Sites No             Capillary Blood Glucose: No results found for this or any previous visit (from the past 24 hours).    Social History   Tobacco Use  Smoking Status Never  Smokeless Tobacco Never    Goals Met:  Independence with exercise equipment Exercise tolerated well No report of concerns or symptoms today Strength training completed today  Goals Unmet:  Not Applicable  Comments: Pt able to follow exercise prescription today without complaint.  Will continue to monitor for progression.

## 2023-11-11 NOTE — Progress Notes (Signed)
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 (Patient taking differently: Take 500-1,000 mg by mouth every 6 (six) hours as needed for moderate pain (pain score 4-6).), Disp: 30 tablet, Rfl: 0   Alpha-D-Galactosidase (BEANO PO), Take 2 capsules by mouth daily as needed (bloating)., Disp: , Rfl:    amLODipine (NORVASC) 10 MG tablet, Take 1 tablet (10 mg total) by mouth daily., Disp: 90 tablet, Rfl: 3   Ascorbic Acid (VITAMIN C PO), Take 564 mg by mouth daily. 282 mg each, Disp: , Rfl:    aspirin EC 81 MG tablet, Take 81 mg by mouth daily., Disp: , Rfl:    azithromycin (ZITHROMAX) 500 MG tablet, Take 1 tablet (500 mg total) by mouth as directed. Take one tablet 1 hour before any dental work including cleanings., Disp: 6 tablet, Rfl: 12   Calcium Carb-Cholecalciferol (CALCIUM 500 + D PO), Take 1 tablet by mouth 2 (two) times daily., Disp: , Rfl:    cyanocobalamin (VITAMIN B12) 1000 MCG tablet, Take 1,000 mcg by mouth daily. Sublingual, Disp: , Rfl:    diclofenac Sodium (VOLTAREN ARTHRITIS PAIN) 1 % GEL, Apply 2 g topically daily as needed (Back pain)., Disp: , Rfl:    fexofenadine (ALLEGRA) 180 MG tablet, Take 1 tablet (180 mg total) by mouth daily as needed for allergies. (Patient taking differently: Take 180 mg by  mouth daily.), Disp: 90 tablet, Rfl: 3   folic acid (FOLVITE) 1 MG tablet, Take 2 mg by mouth daily., Disp: , Rfl:    furosemide (LASIX) 20 MG tablet, Take 1 tablet (20 mg total) by mouth daily as needed (swelling)., Disp: 90 tablet, Rfl: 3   guaifenesin (HUMIBID E) 400 MG TABS tablet, Take 400-1,200 mg by mouth daily as needed (Drainage)., Disp: , Rfl:    HYDROcodone bit-homatropine (HYCODAN) 5-1.5 MG/5ML syrup, Take 5 mLs by mouth at bedtime as needed for cough., Disp: , Rfl:    ibuprofen (ADVIL) 200 MG tablet, Take 200-400 mg by mouth every 6 (six) hours as needed for moderate pain (pain score 4-6)., Disp: , Rfl:    ipratropium (ATROVENT) 0.03 % nasal spray, Place 1 spray into both nostrils 3 (three) times daily as needed for rhinitis. (Patient taking differently: Place 1 spray into both nostrils daily.), Disp: 90 mL, Rfl: 3   losartan (COZAAR) 25 MG tablet, Take 0.5 tablets (12.5 mg total) by mouth daily., Disp: 45 tablet, Rfl: 3   meclizine (ANTIVERT) 12.5 MG tablet, Take 12.5 mg by mouth every 6 (six) hours as needed for dizziness., Disp: , Rfl:    Multiple Vitamin (MULTIVITAMIN WITH MINERALS) TABS tablet, Take 2 tablets by mouth daily. Gummy, Disp: , Rfl:    potassium chloride SA (KLOR-CON M) 20 MEQ tablet, Take 1 tablet (20 mEq total) by mouth as needed (when taking the lasix for swelling)., Disp: 90 tablet, Rfl: 3  predniSONE (DELTASONE) 5 MG tablet, Take 5 mg by mouth daily with breakfast., Disp: , Rfl:    Propylene Glycol, PF, (SYSTANE COMPLETE PF) 0.6 % SOLN, Place 1 drop into both eyes daily as needed (Dry eyes)., Disp: , Rfl:    rosuvastatin (CRESTOR) 5 MG tablet, Take 1 tablet (5 mg total) by mouth every morning., Disp: 90 tablet, Rfl: 3   simethicone (MYLICON) 80 MG chewable tablet, Chew 160 mg by mouth every 6 (six) hours as needed for flatulence., Disp: , Rfl:    Zinc 30 MG CAPS, Take 30 mg by mouth 2 (two) times daily., Disp: , Rfl:   Past Medical History: Past Medical History:   Diagnosis Date   Anemia    Anxiety    Essential hypertension, benign since the 1900's   Family history of adverse reaction to anesthesia    Children - N/V   History of blood transfusion    History of lung cancer    Lung adenocarcinoma s/p resection 2018   Hyperlipidemia    Other osteoporosis    Other thalassemia (HCC)    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 Lynnette Caffey and Dr. Leafy Ro   Severe aortic stenosis     Tobacco Use: Social History   Tobacco Use  Smoking Status Never  Smokeless Tobacco Never    Labs: Review Flowsheet  More data exists      Latest Ref Rng & Units 04/21/2017 11/13/2017 02/18/2018 08/06/2023 09/15/2023  Labs for ITP Cardiac and Pulmonary Rehab  Cholestrol 100 - 199 mg/dL - - 161  - -  LDL (calc) 0 - 99 mg/dL - - 68  - -  Direct LDL 0 - 99 mg/dL - 096  - - -  HDL-C >04 mg/dL - - 90  - -  Trlycerides 0 - 149 mg/dL - - 50  - -  PH, Arterial 7.35 - 7.45 7.405  - - 7.378  -  PCO2 arterial 32 - 48 mmHg 40.9  - - 40.7  -  Bicarbonate 20.0 - 28.0 mmol/L 25.1  - - 23.8  23.9  -  TCO2 22 - 32 mmol/L - - - 25  25  24    Acid-base deficit 0.0 - 2.0 mmol/L - - - 2.0  1.0  -  O2 Saturation % 95.3  - - 71  93  -    Details       Multiple values from one day are sorted in reverse-chronological order         Capillary Blood Glucose: Lab Results  Component Value Date   GLUCAP 88 04/27/2017   GLUCAP 133 (H) 04/26/2017   GLUCAP 133 (H) 04/22/2017   GLUCAP 122 (H) 04/22/2017   GLUCAP 126 (H) 04/21/2017     Exercise Target Goals: Exercise Program Goal: Individual exercise prescription set using results from initial 6 min walk test and THRR while considering  patient's activity barriers and safety.   Exercise Prescription Goal: Starting with aerobic activity 30 plus minutes a day, 3 days per week for initial exercise prescription. Provide home exercise prescription and  guidelines that participant acknowledges understanding prior to discharge.  Activity Barriers & Risk Stratification:  Activity Barriers & Cardiac Risk Stratification - 09/30/23 1419       Activity Barriers & Cardiac Risk Stratification   Activity Barriers Arthritis;Back Problems;Neck/Spine Problems;Deconditioning;Balance Concerns;History of Falls;Shortness of Breath    Cardiac Risk Stratification Moderate  6 Minute Walk:  6 Minute Walk     Row Name 09/30/23 1534         6 Minute Walk   Phase Initial     Distance 840 feet     Walk Time 6 minutes     # of Rest Breaks 1  25 second sitting break     MPH 1.6     METS 1.14     RPE 12     Perceived Dyspnea  1     VO2 Peak 4.93     Symptoms No     Resting HR 82 bpm     Resting BP 140/60     Resting Oxygen Saturation  97 %     Exercise Oxygen Saturation  during 6 min walk 95 %     Max Ex. HR 91 bpm     Max Ex. BP 146/60     2 Minute Post BP 136/60              Oxygen Initial Assessment:   Oxygen Re-Evaluation:   Oxygen Discharge (Final Oxygen Re-Evaluation):   Initial Exercise Prescription:  Initial Exercise Prescription - 09/30/23 1500       Date of Initial Exercise RX and Referring Provider   Date 09/30/23    Referring Provider Dina Rich MD      Oxygen   Oxygen Continuous    Maintain Oxygen Saturation 88% or higher      Treadmill   MPH 1.2    Grade 0    Minutes 15    METs 1.8      NuStep   Level 1    SPM 50    Minutes 15    METs 1.4      Prescription Details   Frequency (times per week) 3    Duration Progress to 30 minutes of continuous aerobic without signs/symptoms of physical distress      Intensity   THRR 40-80% of Max Heartrate 105-128    Ratings of Perceived Exertion 11-13    Perceived Dyspnea 0-4      Resistance Training   Training Prescription Yes    Weight 3 lbs    Reps 10-15             Perform Capillary Blood Glucose checks as  needed.  Exercise Prescription Changes:   Exercise Prescription Changes     Row Name 09/30/23 1500 10/12/23 1200 10/28/23 1200 11/09/23 1300       Response to Exercise   Blood Pressure (Admit) 140/60 122/60 110/56 120/60    Blood Pressure (Exercise) 146/60 140/60 -- --    Blood Pressure (Exit) 136/60 124/60 122/62 122/60    Heart Rate (Admit) 82 bpm 72 bpm 112 bpm 65 bpm    Heart Rate (Exercise) 91 bpm 128 bpm 142 bpm 141 bpm    Heart Rate (Exit) 85 bpm 98 bpm 98 bpm 123 bpm    Oxygen Saturation (Admit) 97 % -- -- --    Oxygen Saturation (Exercise) 95 % -- -- --    Oxygen Saturation (Exit) 95 % -- -- --    Rating of Perceived Exertion (Exercise) 12 12 12 12     Perceived Dyspnea (Exercise) 0 -- -- --    Symptoms R hip pain 5/10 -- -- --    Duration Progress to 30 minutes of  aerobic without signs/symptoms of physical distress Continue with 30 min of aerobic exercise without signs/symptoms of physical distress. Continue with 30 min of  aerobic exercise without signs/symptoms of physical distress. Continue with 30 min of aerobic exercise without signs/symptoms of physical distress.    Intensity THRR unchanged THRR unchanged THRR unchanged THRR unchanged      Progression   Progression -- Continue to progress workloads to maintain intensity without signs/symptoms of physical distress. Continue to progress workloads to maintain intensity without signs/symptoms of physical distress. Continue to progress workloads to maintain intensity without signs/symptoms of physical distress.      Resistance Training   Training Prescription -- Yes Yes Yes    Weight -- 3 lbs 3lbs 3    Reps -- 10-15 10-15 10-15      Treadmill   MPH -- 1.3 1.7 1.8    Grade -- 0.5 0.5 0.5    Minutes -- 15 15 15     METs -- 2.08 2.42 2.5      NuStep   Level -- 1 1 2     SPM -- 50 114 109    Minutes -- 15 15 15     METs -- 1.9 1.9 1.9      Oxygen   Maintain Oxygen Saturation -- 88% or higher 88% or higher 88% or  higher             Exercise Comments:   Exercise Comments     Row Name 10/05/23 6644           Exercise Comments First full day of exercise!  Patient was oriented to gym and equipment including functions, settings, policies, and procedures.  Patient's individual exercise prescription and treatment plan were reviewed.  All starting workloads were established based on the results of the 6 minute walk test done at initial orientation visit.  The plan for exercise progression was also introduced and progression will be customized based on patient's performance and goals.                Exercise Goals and Review:   Exercise Goals     Row Name 09/30/23 1540             Exercise Goals   Increase Physical Activity Yes       Intervention Provide advice, education, support and counseling about physical activity/exercise needs.;Develop an individualized exercise prescription for aerobic and resistive training based on initial evaluation findings, risk stratification, comorbidities and participant's personal goals.       Expected Outcomes Short Term: Attend rehab on a regular basis to increase amount of physical activity.;Long Term: Add in home exercise to make exercise part of routine and to increase amount of physical activity.;Long Term: Exercising regularly at least 3-5 days a week.       Increase Strength and Stamina Yes       Intervention Provide advice, education, support and counseling about physical activity/exercise needs.;Develop an individualized exercise prescription for aerobic and resistive training based on initial evaluation findings, risk stratification, comorbidities and participant's personal goals.       Expected Outcomes Short Term: Increase workloads from initial exercise prescription for resistance, speed, and METs.;Short Term: Perform resistance training exercises routinely during rehab and add in resistance training at home;Long Term: Improve cardiorespiratory  fitness, muscular endurance and strength as measured by increased METs and functional capacity ( )       Able to understand and use rate of perceived exertion (RPE) scale Yes       Intervention Provide education and explanation on how to use RPE scale       Expected Outcomes Short Term: Able  to use RPE daily in rehab to express subjective intensity level;Long Term:  Able to use RPE to guide intensity level when exercising independently       Able to understand and use Dyspnea scale Yes       Intervention Provide education and explanation on how to use Dyspnea scale       Expected Outcomes Short Term: Able to use Dyspnea scale daily in rehab to express subjective sense of shortness of breath during exertion;Long Term: Able to use Dyspnea scale to guide intensity level when exercising independently       Knowledge and understanding of Target Heart Rate Range (THRR) Yes       Intervention Provide education and explanation of THRR including how the numbers were predicted and where they are located for reference       Expected Outcomes Short Term: Able to state/look up THRR;Long Term: Able to use THRR to govern intensity when exercising independently;Short Term: Able to use daily as guideline for intensity in rehab       Able to check pulse independently Yes       Intervention Provide education and demonstration on how to check pulse in carotid and radial arteries.;Review the importance of being able to check your own pulse for safety during independent exercise       Expected Outcomes Short Term: Able to explain why pulse checking is important during independent exercise       Understanding of Exercise Prescription Yes       Intervention Provide education, explanation, and written materials on patient's individual exercise prescription       Expected Outcomes Short Term: Able to explain program exercise prescription;Long Term: Able to explain home exercise prescription to exercise independently                 Exercise Goals Re-Evaluation :  Exercise Goals Re-Evaluation     Row Name 10/05/23 0926 10/16/23 0758 10/27/23 1349 10/30/23 0735       Exercise Goal Re-Evaluation   Exercise Goals Review Increase Physical Activity;Increase Strength and Stamina;Able to understand and use rate of perceived exertion (RPE) scale;Able to understand and use Dyspnea scale;Knowledge and understanding of Target Heart Rate Range (THRR);Understanding of Exercise Prescription;Able to check pulse independently Increase Physical Activity;Increase Strength and Stamina;Understanding of Exercise Prescription Increase Physical Activity;Increase Strength and Stamina;Able to understand and use Dyspnea scale;Able to understand and use rate of perceived exertion (RPE) scale;Knowledge and understanding of Target Heart Rate Range (THRR);Able to check pulse independently;Understanding of Exercise Prescription Increase Physical Activity;Increase Strength and Stamina;Understanding of Exercise Prescription    Comments Reviewed RPE and dyspnea scale, THR and program prescription with pt today.  Pt voiced understanding and was given a copy of goals to take home. Dennie Bible has just started rehab and is tolerating exerice well. She is getting use to the treadmill and needs queing to take big steps instead of small quick steps. Will continue to monitor and progress as able. Dennie Bible is doing well in rehab so far.  She is already starting to feel like she is making improvements.  She has already started to add in more stretching and balance practice at home.  She is planning to get some new weights to use at home. Reviewed home exercise with pt today.  Pt plans to walk at church with daughter and friends for exercise.  Reviewed THR, pulse, RPE, sign and symptoms, pulse oximetery and when to call 911 or MD.  Also discussed weather considerations and  indoor options.  Pt voiced understanding.  Dennie Bible wants to continue to be active and get back to helping  at church Dennie Bible is doing well in rehab and is tolerating exercise well. She has increased her speed on the treadmill to 1.7 with a 0.5 incline. Her heart rate did reach higher then THR onthe nustep and will need to slow down SPM. Will continue to monitor and progress as able/    Expected Outcomes Short: Use RPE daily to regulate intensity.  Long: Follow program prescription in THR. continue to attend rehab Short: Start to add in cardio at home Long: Continue to improve stamina Monitor RPE and HR when on the nustep   continue to attend rehab              Discharge Exercise Prescription (Final Exercise Prescription Changes):  Exercise Prescription Changes - 11/09/23 1300       Response to Exercise   Blood Pressure (Admit) 120/60    Blood Pressure (Exit) 122/60    Heart Rate (Admit) 65 bpm    Heart Rate (Exercise) 141 bpm    Heart Rate (Exit) 123 bpm    Rating of Perceived Exertion (Exercise) 12    Duration Continue with 30 min of aerobic exercise without signs/symptoms of physical distress.    Intensity THRR unchanged      Progression   Progression Continue to progress workloads to maintain intensity without signs/symptoms of physical distress.      Resistance Training   Training Prescription Yes    Weight 3    Reps 10-15      Treadmill   MPH 1.8    Grade 0.5    Minutes 15    METs 2.5      NuStep   Level 2    SPM 109    Minutes 15    METs 1.9      Oxygen   Maintain Oxygen Saturation 88% or higher             Nutrition:  Target Goals: Understanding of nutrition guidelines, daily intake of sodium 1500mg , cholesterol 200mg , calories 30% from fat and 7% or less from saturated fats, daily to have 5 or more servings of fruits and vegetables.  Biometrics:  Pre Biometrics - 09/30/23 1540       Pre Biometrics   Height 4\' 11"  (1.499 m)    Weight 129 lb 10.1 oz (58.8 kg)    Waist Circumference 34 inches    Hip Circumference 40 inches    Waist to Hip Ratio 0.85 %     BMI (Calculated) 26.17    Grip Strength 18.9 kg    Single Leg Stand 2 seconds              Nutrition Therapy Plan and Nutrition Goals:   Nutrition Assessments:  MEDIFICTS Score Key: >=70 Need to make dietary changes  40-70 Heart Healthy Diet <= 40 Therapeutic Level Cholesterol Diet  Flowsheet Row CARDIAC REHAB PHASE II ORIENTATION from 09/30/2023 in [facility] CARDIAC REHABILITATION  Picture Your Plate Total Score on Admission 38      Picture Your Plate Scores: <09 Unhealthy dietary pattern with much room for improvement. 41-50 Dietary pattern unlikely to meet recommendations for good health and room for improvement. 51-60 More healthful dietary pattern, with some room for improvement.  >60 Healthy dietary pattern, although there may be some specific behaviors that could be improved.    Nutrition Goals Re-Evaluation:  Nutrition Goals Re-Evaluation  Row Name 10/27/23 1401             Goals   Nutrition Goal Heart Healthy Diet       Comment Dennie Bible is working on her diet.  She has not cut out the bad stuff yet, but has started to add the good variety of fruits and vegetables back in.  She used to do weight watchers but no longer follows that.  She does use their exchange theory to help with her diet.       Expected Outcome Short Continue to cut back on bad stuff Long: continue to work on diet                Nutrition Goals Discharge (Final Nutrition Goals Re-Evaluation):  Nutrition Goals Re-Evaluation - 10/27/23 1401       Goals   Nutrition Goal Heart Healthy Diet    Comment Dennie Bible is working on her diet.  She has not cut out the bad stuff yet, but has started to add the good variety of fruits and vegetables back in.  She used to do weight watchers but no longer follows that.  She does use their exchange theory to help with her diet.    Expected Outcome Short Continue to cut back on bad stuff Long: continue to work on diet              Psychosocial: Target Goals: Acknowledge presence or absence of significant depression and/or stress, maximize coping skills, provide positive support system. Participant is able to verbalize types and ability to use techniques and skills needed for reducing stress and depression.  Initial Review & Psychosocial Screening:  Initial Psych Review & Screening - 09/30/23 1520       Initial Review   Current issues with None Identified      Family Dynamics   Good Support System? Yes    Comments Sisters, 2 sons and daughter-in-laws and chruch family.      Barriers   Psychosocial barriers to participate in program The patient should benefit from training in stress management and relaxation.;There are no identifiable barriers or psychosocial needs.      Screening Interventions   Interventions Encouraged to exercise;To provide support and resources with identified psychosocial needs;Provide feedback about the scores to participant    Expected Outcomes Long Term Goal: Stressors or current issues are controlled or eliminated.;Short Term goal: Utilizing psychosocial counselor, staff and physician to assist with identification of specific Stressors or current issues interfering with healing process. Setting desired goal for each stressor or current issue identified.;Short Term goal: Identification and review with participant of any Quality of Life or Depression concerns found by scoring the questionnaire.;Long Term goal: The participant improves quality of Life and PHQ9 Scores as seen by post scores and/or verbalization of changes             Quality of Life Scores:  Quality of Life - 09/30/23 1543       Quality of Life   Select Quality of Life      Quality of Life Scores   Health/Function Pre 19.32 %    Socioeconomic Pre 24.75 %    Psych/Spiritual Pre 21.07 %    Family Pre 21 %    GLOBAL Pre 20.98 %            Scores of 19 and below usually indicate a poorer quality of life in  these areas.  A difference of  2-3 points is a clinically meaningful difference.  A difference of 2-3 points in the total score of the Quality of Life Index has been associated with significant improvement in overall quality of life, self-image, physical symptoms, and general health in studies assessing change in quality of life.  PHQ-9: Review Flowsheet  More data exists      10/27/2023 09/30/2023 11/28/2011 10/31/2011 07/28/2011  Depression screen PHQ 2/9  Decreased Interest 0 1 0 0 0  Down, Depressed, Hopeless 0 0 0 0 1  PHQ - 2 Score 0 1 0 0 1  Altered sleeping 0 1 - - -  Tired, decreased energy 2 2 - - -  Change in appetite 0 2 - - -  Feeling bad or failure about yourself  0 0 - - -  Trouble concentrating 1 1 - - -  Moving slowly or fidgety/restless 0 0 - - -  Suicidal thoughts 0 0 - - -  PHQ-9 Score 3 7 - - -  Difficult doing work/chores Somewhat difficult Somewhat difficult - - -   Interpretation of Total Score  Total Score Depression Severity:  1-4 = Minimal depression, 5-9 = Mild depression, 10-14 = Moderate depression, 15-19 = Moderately severe depression, 20-27 = Severe depression   Psychosocial Evaluation and Intervention:  Psychosocial Evaluation - 09/30/23 1554       Psychosocial Evaluation & Interventions   Interventions Stress management education;Relaxation education;Encouraged to exercise with the program and follow exercise prescription    Comments Patient was referred to CR with TAVR 09/15/23. Her PHQ-9 score was 7 due to lack of energy, overeating and trouble falling asleep. She denies any depression or anxiety. She says her 30 year-old great grandson is undergoing some testing and this is causing her some stress but otherwise no major stressors. She has a great support system with her 2 sons and daughter-in-laws and 2 sisters and she is very active in her church. She says she knows several people that have participated in CR and she is very motivated to do the program.  She does have a $35 co-payment but does not think this will be a problem. Her goals for the program are to be able to work in her garden again; do yardwork; strengthen her core; and get stronger overall; and be able to do more with her great grand-children. She has no barriers identified to complete the program.    Expected Outcomes Short Term: start the program and attend consistently. Long term: meet her personal goals.    Continue Psychosocial Services  Follow up required by staff             Psychosocial Re-Evaluation:  Psychosocial Re-Evaluation     Row Name 10/27/23 1354             Psychosocial Re-Evaluation   Current issues with History of Depression;Current Stress Concerns       Comments Dennie Bible is doing well in rehab.  She is eager to get moving again and to be able to get back to helping out at church.  She is planning to start to walk with her daughter and friends at church to help.  Reassess PHQ today and it has improved from a 7 to a 3.  She says that she is not depressed, just frustrated at times that she can't go and do.  She has a sister that is bed ridden and her nephew cares for her sister.  She feels blessed with what she does have. She is sleeping better overall, just has frequent trips to  bathroom with her bladder. Dennie Bible has three sons.  Two are great and she has one that has made poor lifestyle decisions that has left him with bilateral leg amputation and now having seziures.  She does not feel that she cannot care for him as she can't watch him destroy himself.  His dad would not take him back.  He is currently at the [facility] but does not want to give up his income to pay for habits.  She made the hard choice to say yes to herself.  Her oldest son looks after his brother and sets up his arrangements.       Expected Outcomes Short: Continue to cope with son Long: Continue to focus on the positive       Interventions Encouraged to attend Cardiac Rehabilitation for the  exercise;Stress management education       Continue Psychosocial Services  Follow up required by staff                Psychosocial Discharge (Final Psychosocial Re-Evaluation):  Psychosocial Re-Evaluation - 10/27/23 1354       Psychosocial Re-Evaluation   Current issues with History of Depression;Current Stress Concerns    Comments Dennie Bible is doing well in rehab.  She is eager to get moving again and to be able to get back to helping out at church.  She is planning to start to walk with her daughter and friends at church to help.  Reassess PHQ today and it has improved from a 7 to a 3.  She says that she is not depressed, just frustrated at times that she can't go and do.  She has a sister that is bed ridden and her nephew cares for her sister.  She feels blessed with what she does have. She is sleeping better overall, just has frequent trips to bathroom with her bladder. Dennie Bible has three sons.  Two are great and she has one that has made poor lifestyle decisions that has left him with bilateral leg amputation and now having seziures.  She does not feel that she cannot care for him as she can't watch him destroy himself.  His dad would not take him back.  He is currently at the [facility] but does not want to give up his income to pay for habits.  She made the hard choice to say yes to herself.  Her oldest son looks after his brother and sets up his arrangements.    Expected Outcomes Short: Continue to cope with son Long: Continue to focus on the positive    Interventions Encouraged to attend Cardiac Rehabilitation for the exercise;Stress management education    Continue Psychosocial Services  Follow up required by staff             Vocational Rehabilitation: Provide vocational rehab assistance to qualifying candidates.   Vocational Rehab Evaluation & Intervention:  Vocational Rehab - 09/30/23 1519       Initial Vocational Rehab Evaluation & Intervention   Assessment shows need for  Vocational Rehabilitation No      Vocational Rehab Re-Evaulation   Comments Retired             Education: Education Goals: Education classes will be provided on a weekly basis, covering required topics. Participant will state understanding/return demonstration of topics presented.  Learning Barriers/Preferences:  Learning Barriers/Preferences - 09/30/23 1430       Learning Barriers/Preferences   Learning Barriers None    Learning Preferences Written Material;Audio;Skilled Demonstration  Education Topics: Hypertension, Hypertension Reduction -Define heart disease and high blood pressure. Discus how high blood pressure affects the body and ways to reduce high blood pressure. Flowsheet Row CARDIAC REHAB PHASE II EXERCISE from 10/28/2023 in [place] CARDIAC REHABILITATION  Date 10/28/23  Educator jh  Instruction Review Code 1- Verbalizes Understanding       Exercise and Your Heart -Discuss why it is important to exercise, the FITT principles of exercise, normal and abnormal responses to exercise, and how to exercise safely.   Angina -Discuss definition of angina, causes of angina, treatment of angina, and how to decrease risk of having angina.   Cardiac Medications -Review what the following cardiac medications are used for, how they affect the body, and side effects that may occur when taking the medications.  Medications include Aspirin, Beta blockers, calcium channel blockers, ACE Inhibitors, angiotensin receptor blockers, diuretics, digoxin, and antihyperlipidemics.   Congestive Heart Failure -Discuss the definition of CHF, how to live with CHF, the signs and symptoms of CHF, and how keep track of weight and sodium intake.   Heart Disease and Intimacy -Discus the effect sexual activity has on the heart, how changes occur during intimacy as we age, and safety during sexual activity.   Smoking Cessation / COPD -Discuss different methods to quit  smoking, the health benefits of quitting smoking, and the definition of COPD.   Nutrition I: Fats -Discuss the types of cholesterol, what cholesterol does to the heart, and how cholesterol levels can be controlled.   Nutrition II: Labels -Discuss the different components of food labels and how to read food label   Heart Parts/Heart Disease and PAD -Discuss the anatomy of the heart, the pathway of blood circulation through the heart, and these are affected by heart disease.   Stress I: Signs and Symptoms -Discuss the causes of stress, how stress may lead to anxiety and depression, and ways to limit stress. Flowsheet Row CARDIAC REHAB PHASE II EXERCISE from 10/28/2023 in [place] CARDIAC REHABILITATION  Date 10/21/23  Educator hb  Instruction Review Code 1- Verbalizes Understanding       Stress II: Relaxation -Discuss different types of relaxation techniques to limit stress.   Warning Signs of Stroke / TIA -Discuss definition of a stroke, what the signs and symptoms are of a stroke, and how to identify when someone is having stroke.   Knowledge Questionnaire Score:  Knowledge Questionnaire Score - 10/05/23 0948       Knowledge Questionnaire Score   Pre Score 22/24             Core Components/Risk Factors/Patient Goals at Admission:  Personal Goals and Risk Factors at Admission - 09/30/23 1519       Core Components/Risk Factors/Patient Goals on Admission    Weight Management Weight Maintenance    Improve shortness of breath with ADL's Yes    Intervention Provide education, individualized exercise plan and daily activity instruction to help decrease symptoms of SOB with activities of daily living.    Expected Outcomes Short Term: Improve cardiorespiratory fitness to achieve a reduction of symptoms when performing ADLs;Long Term: Be able to perform more ADLs without symptoms or delay the onset of symptoms    Heart Failure Yes    Intervention Provide a combined  exercise and nutrition program that is supplemented with education, support and counseling about heart failure. Directed toward relieving symptoms such as shortness of breath, decreased exercise tolerance, and extremity edema.    Expected Outcomes Improve functional capacity  of life;Short term: Attendance in program 2-3 days a week with increased exercise capacity. Reported lower sodium intake. Reported increased fruit and vegetable intake. Reports medication compliance.;Short term: Daily weights obtained and reported for increase. Utilizing diuretic protocols set by physician.;Long term: Adoption of self-care skills and reduction of barriers for early signs and symptoms recognition and intervention leading to self-care maintenance.    Hypertension Yes    Intervention Provide education on lifestyle modifcations including regular physical activity/exercise, weight management, moderate sodium restriction and increased consumption of fresh fruit, vegetables, and low fat dairy, alcohol moderation, and smoking cessation.;Monitor prescription use compliance.    Expected Outcomes Short Term: Continued assessment and intervention until BP is < 140/58mm HG in hypertensive participants. < 130/60mm HG in hypertensive participants with diabetes, heart failure or chronic kidney disease.;Long Term: Maintenance of blood pressure at goal levels.    Lipids Yes    Intervention Provide education and support for participant on nutrition & aerobic/resistive exercise along with prescribed medications to achieve LDL 70mg , HDL >40mg .    Expected Outcomes Short Term: Participant states understanding of desired cholesterol values and is compliant with medications prescribed. Participant is following exercise prescription and nutrition guidelines.;Long Term: Cholesterol controlled with medications as prescribed, with individualized exercise RX and with personalized nutrition plan. Value goals: LDL < 70mg , HDL > 40 mg.              Core Components/Risk Factors/Patient Goals Review:   Goals and Risk Factor Review     Row Name 10/27/23 1403             Core Components/Risk Factors/Patient Goals Review   Personal Goals Review Improve shortness of breath with ADL's;Weight Management/Obesity;Hypertension;Heart Failure;Lipids       Review Dennie Bible is doing well in rehab.  She is working on maintaining her weight.  She has had any heart failure symptoms.  Her pressures are doing well.  She was doing well with checking them at home, but has gotten away from it with the weather.  She is also starting to breath better and feel better overall.       Expected Outcomes Short: Continue to montior heart failure Long: Continue to montior risk factors.                Core Components/Risk Factors/Patient Goals at Discharge (Final Review):   Goals and Risk Factor Review - 10/27/23 1403       Core Components/Risk Factors/Patient Goals Review   Personal Goals Review Improve shortness of breath with ADL's;Weight Management/Obesity;Hypertension;Heart Failure;Lipids    Review Dennie Bible is doing well in rehab.  She is working on maintaining her weight.  She has had any heart failure symptoms.  Her pressures are doing well.  She was doing well with checking them at home, but has gotten away from it with the weather.  She is also starting to breath better and feel better overall.    Expected Outcomes Short: Continue to montior heart failure Long: Continue to montior risk factors.             ITP Comments:  ITP Comments     Row Name 10/05/23 [phone] 10/13/23 0805 11/11/23 0959       ITP Comments First full day of exercise!  Patient was oriented to gym and equipment including functions, settings, policies, and procedures.  Patient's individual exercise prescription and treatment plan were reviewed.  All starting workloads were established based on the results of the 6 minute walk test done at initial  orientation visit.  The plan for  exercise progression was also introduced and progression will be customized based on patient's performance and goals. 30 day review completed. ITP sent to Dr. Dina Rich, Medical Director of Cardiac Rehab. Continue with ITP unless changes are made by physician.  Still new to program. 30 day review completed. ITP sent to Dr. Dina Rich, Medical Director of Cardiac Rehab. Continue with ITP unless changes are made by physician.              Comments: 30 day review

## 2023-11-13 ENCOUNTER — Encounter (HOSPITAL_COMMUNITY)
Admission: RE | Admit: 2023-11-13 | Discharge: 2023-11-13 | Disposition: A | Discharge status: Home / Self Care | Payer: Medicare HMO | Source: Ambulatory Visit | Attending: Cardiology | Admitting: Cardiology

## 2023-11-13 DIAGNOSIS — Z953 Presence of xenogenic heart valve: Secondary | ICD-10-CM

## 2023-11-13 NOTE — Progress Notes (Signed)
Daily Session Note  Patient Details  Name: Dartha Rozzell MRN: [id number] Date of Birth: 03-17-1941 Referring Provider:   Flowsheet Row CARDIAC REHAB PHASE II ORIENTATION from 09/30/2023 in [facility] CARDIAC REHABILITATION  Referring Provider Dina Rich MD       Encounter Date: 11/13/2023  Check In:  Session Check In - 11/13/23 1058       Check-In   Supervising physician immediately available to respond to emergencies See telemetry face sheet for immediately available MD    Location [facility]    Staff Present Terrance Mass, RN;Heather Fredric Mare, BS, Exercise Physiologist;Niesha Bame Long, MA, RCEP, CCRP, Dow Adolph, RN, BSN    Virtual Visit No    Medication changes reported     No    Fall or balance concerns reported    No    Warm-up and Cool-down Performed on first and last piece of equipment    Resistance Training Performed Yes    VAD Patient? No    PAD/SET Patient? No      Pain Assessment   Currently in Pain? No/denies             Capillary Blood Glucose: No results found for this or any previous visit (from the past 24 hours).    Social History   Tobacco Use  Smoking Status Never  Smokeless Tobacco Never    Goals Met:  Independence with exercise equipment Exercise tolerated well No report of concerns or symptoms today Strength training completed today  Goals Unmet:  Not Applicable  Comments: Pt able to follow exercise prescription today without complaint.  Will continue to monitor for progression.

## 2023-11-16 ENCOUNTER — Encounter (HOSPITAL_COMMUNITY)
Admission: RE | Admit: 2023-11-16 | Discharge: 2023-11-16 | Disposition: A | Discharge status: Home / Self Care | Payer: Medicare HMO | Source: Ambulatory Visit | Attending: Cardiology | Admitting: Cardiology

## 2023-11-16 DIAGNOSIS — Z952 Presence of prosthetic heart valve: Secondary | ICD-10-CM | POA: Insufficient documentation

## 2023-11-16 DIAGNOSIS — Z953 Presence of xenogenic heart valve: Secondary | ICD-10-CM | POA: Diagnosis present

## 2023-11-16 NOTE — Progress Notes (Signed)
Daily Session Note  Patient Details  Name: Dawn Lin MRN: [id number] Date of Birth: [birth year] Referring Provider:   Flowsheet Row CARDIAC REHAB PHASE II ORIENTATION from 09/30/2023 in [name] CARDIAC REHABILITATION  Referring Provider Dina Rich MD       Encounter Date: 11/16/2023  Check In:  Session Check In - 11/16/23 1110       Check-In   Supervising physician immediately available to respond to emergencies See telemetry face sheet for immediately available MD    Location [facility]-Cardiac & Pulmonary Rehab    Staff Present Hulen Luster, BS, RRT, CPFT;Yaeko Fazekas [place], MA, RCEP, CCRP, Dow Adolph, RN, BSN    Virtual Visit No    Medication changes reported     No    Fall or balance concerns reported    No    Warm-up and Cool-down Performed on first and last piece of equipment    Resistance Training Performed Yes    VAD Patient? No    PAD/SET Patient? No      Pain Assessment   Currently in Pain? No/denies             Capillary Blood Glucose: No results found for this or any previous visit (from the past 24 hours).    Social History   Tobacco Use  Smoking Status Never  Smokeless Tobacco Never    Goals Met:  Independence with exercise equipment Exercise tolerated well No report of concerns or symptoms today Strength training completed today  Goals Unmet:  Not Applicable  Comments: Pt able to follow exercise prescription today without complaint.  Will continue to monitor for progression.

## 2023-11-18 ENCOUNTER — Encounter (HOSPITAL_COMMUNITY)
Admission: RE | Admit: 2023-11-18 | Discharge: 2023-11-18 | Disposition: A | Discharge status: Home / Self Care | Payer: Medicare HMO | Source: Ambulatory Visit | Attending: Cardiology | Admitting: Cardiology

## 2023-11-18 DIAGNOSIS — Z953 Presence of xenogenic heart valve: Secondary | ICD-10-CM

## 2023-11-18 NOTE — Progress Notes (Signed)
 Reviewed home exercise with pt today.  Pt plans to walk at church with friends for exercise.  We also talked about trying to use the staff videos as well.  Reviewed THR, pulse, RPE, sign and symptoms, pulse oximetery and when to call 911 or MD.  Also discussed weather considerations and indoor options.  Pt voiced understanding.

## 2023-11-18 NOTE — Progress Notes (Signed)
 Daily Session Note  Patient Details  Name: Dawn Lin MRN: [id number] Date of Birth: 03-15-1941 Referring Provider:   Flowsheet Row CARDIAC REHAB PHASE II ORIENTATION from 09/30/2023 in [facility] CARDIAC REHABILITATION  Referring Provider Alvan Carrier MD       Encounter Date: 11/18/2023  Check In:  Session Check In - 11/18/23 1027       Check-In   Supervising physician immediately available to respond to emergencies See telemetry face sheet for immediately available MD    Location [facility]    Staff Present Powell Benders, BS, Exercise Physiologist;Jessica Vonzell, MA, RCEP, CCRP, CCET    Virtual Visit No    Medication changes reported     No    Fall or balance concerns reported    No    Tobacco Cessation No Change    Warm-up and Cool-down Performed on first and last piece of equipment    Resistance Training Performed Yes    VAD Patient? No    PAD/SET Patient? No      Pain Assessment   Currently in Pain? No/denies    Pain Score 0-No pain    Multiple Pain Sites No             Capillary Blood Glucose: No results found for this or any previous visit (from the past 24 hours).    Social History   Tobacco Use  Smoking Status Never  Smokeless Tobacco Never    Goals Met:  Independence with exercise equipment Exercise tolerated well No report of concerns or symptoms today Strength training completed today  Goals Unmet:  Not Applicable  Comments: Pt able to follow exercise prescription today without complaint.  Will continue to monitor for progression.

## 2023-11-20 ENCOUNTER — Encounter (HOSPITAL_COMMUNITY)
Admission: RE | Admit: 2023-11-20 | Discharge: 2023-11-20 | Disposition: A | Discharge status: Home / Self Care | Payer: Medicare HMO | Source: Ambulatory Visit | Attending: Cardiology | Admitting: Cardiology

## 2023-11-20 DIAGNOSIS — Z953 Presence of xenogenic heart valve: Secondary | ICD-10-CM

## 2023-11-20 NOTE — Progress Notes (Signed)
 Daily Session Note  Patient Details  Name: Briannie Gutierrez MRN: [id number] Date of Birth: 1941-06-22 Referring Provider:   Flowsheet Row CARDIAC REHAB PHASE II ORIENTATION from 09/30/2023 in [facility] CARDIAC REHABILITATION  Referring Provider Alvan Carrier MD       Encounter Date: 11/20/2023  Check In:  Session Check In - 11/20/23 1059       Check-In   Supervising physician immediately available to respond to emergencies See telemetry face sheet for immediately available MD    Staff Present Powell Benders, BS, Exercise Physiologist;Debra Vicci, RN, Randye Gelineau, MA, RCEP, CCRP, CCET    Virtual Visit No    Medication changes reported     No    Fall or balance concerns reported    No    Warm-up and Cool-down Performed on first and last piece of equipment    Resistance Training Performed Yes    VAD Patient? No    PAD/SET Patient? No      Pain Assessment   Currently in Pain? No/denies             Capillary Blood Glucose: No results found for this or any previous visit (from the past 24 hours).    Social History   Tobacco Use  Smoking Status Never  Smokeless Tobacco Never    Goals Met:  Independence with exercise equipment Exercise tolerated well No report of concerns or symptoms today Strength training completed today  Goals Unmet:  Not Applicable  Comments: Pt able to follow exercise prescription today without complaint.  Will continue to monitor for progression.

## 2023-11-23 ENCOUNTER — Encounter (HOSPITAL_COMMUNITY)
Admission: RE | Admit: 2023-11-23 | Discharge: 2023-11-23 | Disposition: A | Discharge status: Home / Self Care | Payer: Medicare HMO | Source: Ambulatory Visit | Attending: Cardiology | Admitting: Cardiology

## 2023-11-23 DIAGNOSIS — Z953 Presence of xenogenic heart valve: Secondary | ICD-10-CM

## 2023-11-23 NOTE — Progress Notes (Signed)
 Daily Session Note  Patient Details  Name: Brunhilda Lans MRN: [id number] Date of Birth: Mar 15, 1941 Referring Provider:   Flowsheet Row CARDIAC REHAB PHASE II ORIENTATION from 09/30/2023 in [facility] CARDIAC REHABILITATION  Referring Provider Armida Lander MD       Encounter Date: 11/23/2023  Check In:  Session Check In - 11/23/23 1045       Check-In   Supervising physician immediately available to respond to emergencies See telemetry face sheet for immediately available ER MD    Location [facility]    Staff Present Clotilda Danish, BS, Exercise Physiologist;Phyllis Billingsley, Adah Acron, RN, BSN    Virtual Visit No    Medication changes reported     No    Fall or balance concerns reported    No    Warm-up and Cool-down Performed on first and last piece of equipment    Resistance Training Performed Yes    VAD Patient? No    PAD/SET Patient? No      Pain Assessment   Currently in Pain? No/denies    Pain Score 0-No pain    Multiple Pain Sites No             Capillary Blood Glucose: No results found for this or any previous visit (from the past 24 hours).    Social History   Tobacco Use  Smoking Status Never  Smokeless Tobacco Never    Goals Met:  Independence with exercise equipment Exercise tolerated well No report of concerns or symptoms today Strength training completed today  Goals Unmet:  Not Applicable  Comments: Pt able to follow exercise prescription today without complaint.  Will continue to monitor for progression.

## 2023-11-25 ENCOUNTER — Encounter (HOSPITAL_COMMUNITY)
Admission: RE | Admit: 2023-11-25 | Discharge: 2023-11-25 | Disposition: A | Discharge status: Home / Self Care | Payer: Medicare HMO | Source: Ambulatory Visit | Attending: Cardiology | Admitting: Cardiology

## 2023-11-25 DIAGNOSIS — Z953 Presence of xenogenic heart valve: Secondary | ICD-10-CM | POA: Diagnosis not present

## 2023-11-25 NOTE — Progress Notes (Signed)
Daily Session Note  Patient Details  Name: Dawn Lin MRN: [id number] Date of Birth: 02-11-1941 Referring Provider:   Flowsheet Row CARDIAC REHAB PHASE II ORIENTATION from 09/30/2023 in [facility] CARDIAC REHABILITATION  Referring Provider Dina Rich MD       Encounter Date: 11/25/2023  Check In:  Session Check In - 11/25/23 1020       Check-In   Supervising physician immediately available to respond to emergencies See telemetry face sheet for immediately available MD    Location [facility]    Staff Present Avanell Shackleton BSN, RN;Heather Fredric Mare, BS, Exercise Physiologist;Jessica [place], MA, RCEP, CCRP, CCET    Virtual Visit No    Medication changes reported     No    Fall or balance concerns reported    No    Tobacco Cessation No Change    Warm-up and Cool-down Performed on first and last piece of equipment    Resistance Training Performed Yes    VAD Patient? No    PAD/SET Patient? No      Pain Assessment   Currently in Pain? No/denies    Pain Score 0-No pain    Multiple Pain Sites No             Capillary Blood Glucose: No results found for this or any previous visit (from the past 24 hours).    Social History   Tobacco Use  Smoking Status Never  Smokeless Tobacco Never    Goals Met:  Independence with exercise equipment Exercise tolerated well No report of concerns or symptoms today Strength training completed today  Goals Unmet:  Not Applicable  Comments: Marland KitchenMarland KitchenPt able to follow exercise prescription today without complaint.  Will continue to monitor for progression.

## 2023-11-27 ENCOUNTER — Encounter (HOSPITAL_COMMUNITY)
Admission: RE | Admit: 2023-11-27 | Discharge: 2023-11-27 | Disposition: A | Discharge status: Home / Self Care | Payer: Medicare HMO | Source: Ambulatory Visit | Attending: Cardiology | Admitting: Cardiology

## 2023-11-27 DIAGNOSIS — Z953 Presence of xenogenic heart valve: Secondary | ICD-10-CM | POA: Diagnosis not present

## 2023-11-27 NOTE — Progress Notes (Signed)
Daily Session Note  Patient Details  Name: Jernee Murtaugh MRN: [id number] Date of Birth: 01/19/1941 Referring Provider:   Flowsheet Row CARDIAC REHAB PHASE II ORIENTATION from 09/30/2023 in [facility] CARDIAC REHABILITATION  Referring Provider Dina Rich MD       Encounter Date: 11/27/2023  Check In:  Session Check In - 11/27/23 1045       Check-In   Supervising physician immediately available to respond to emergencies See telemetry face sheet for immediately available ER MD    Location [facility]    Staff Present Rodena Medin, RN, BSN;Jessica Juanetta [place], MA, RCEP, CCRP, CCET;Terrance Mass, RN    Virtual Visit No    Medication changes reported     No    Fall or balance concerns reported    No    Warm-up and Cool-down Performed on first and last piece of equipment    Resistance Training Performed Yes    VAD Patient? No    PAD/SET Patient? No      Pain Assessment   Currently in Pain? No/denies    Pain Score 0-No pain    Multiple Pain Sites No             Capillary Blood Glucose: No results found for this or any previous visit (from the past 24 hours).    Social History   Tobacco Use  Smoking Status Never  Smokeless Tobacco Never    Goals Met:  Independence with exercise equipment Exercise tolerated well Personal goals reviewed No report of concerns or symptoms today Strength training completed today  Goals Unmet:  Not Applicable  Comments:  Pt able to follow exercise prescription today without complaint.  Will continue to monitor for progression.

## 2023-11-30 ENCOUNTER — Encounter (HOSPITAL_COMMUNITY)
Admission: RE | Admit: 2023-11-30 | Discharge: 2023-11-30 | Disposition: A | Discharge status: Home / Self Care | Payer: Medicare HMO | Source: Ambulatory Visit | Attending: Cardiology

## 2023-11-30 DIAGNOSIS — Z953 Presence of xenogenic heart valve: Secondary | ICD-10-CM | POA: Diagnosis not present

## 2023-11-30 NOTE — Progress Notes (Signed)
Daily Session Note  Patient Details  Name: Dawn Lin MRN: [id number] Date of Birth: [birth year] Referring Provider:   Flowsheet Row CARDIAC REHAB PHASE II ORIENTATION from 09/30/2023 in [facility] CARDIAC REHABILITATION  Referring Provider Dina Rich MD       Encounter Date: 11/30/2023  Check In:  Session Check In - 11/30/23 1059       Check-In   Supervising physician immediately available to respond to emergencies See telemetry face sheet for immediately available MD    Location [facility]    Staff Present Rodena Medin, RN, BSN;Laureen Manson Passey, BS, RRT, CPFT;Jerolene Kupfer [place], MA, RCEP, CCRP, CCET    Virtual Visit No    Medication changes reported     No    Fall or balance concerns reported    No    Warm-up and Cool-down Performed on first and last piece of equipment    Resistance Training Performed Yes    VAD Patient? No    PAD/SET Patient? No      Pain Assessment   Currently in Pain? No/denies             Capillary Blood Glucose: No results found for this or any previous visit (from the past 24 hours).    Social History   Tobacco Use  Smoking Status Never  Smokeless Tobacco Never    Goals Met:  Independence with exercise equipment Exercise tolerated well No report of concerns or symptoms today Strength training completed today  Goals Unmet:  Not Applicable  Comments: Pt able to follow exercise prescription today without complaint.  Will continue to monitor for progression.

## 2023-12-02 ENCOUNTER — Encounter (HOSPITAL_COMMUNITY): Payer: Medicare HMO

## 2023-12-04 ENCOUNTER — Encounter (HOSPITAL_COMMUNITY)
Admission: RE | Admit: 2023-12-04 | Discharge: 2023-12-04 | Disposition: A | Discharge status: Home / Self Care | Payer: Medicare HMO | Source: Ambulatory Visit | Attending: Cardiology | Admitting: Cardiology

## 2023-12-04 DIAGNOSIS — Z953 Presence of xenogenic heart valve: Secondary | ICD-10-CM | POA: Diagnosis not present

## 2023-12-04 DIAGNOSIS — M9905 Segmental and somatic dysfunction of pelvic region: Secondary | ICD-10-CM | POA: Diagnosis not present

## 2023-12-04 DIAGNOSIS — M9903 Segmental and somatic dysfunction of lumbar region: Secondary | ICD-10-CM | POA: Diagnosis not present

## 2023-12-04 DIAGNOSIS — M25562 Pain in left knee: Secondary | ICD-10-CM | POA: Diagnosis not present

## 2023-12-04 DIAGNOSIS — M5442 Lumbago with sciatica, left side: Secondary | ICD-10-CM | POA: Diagnosis not present

## 2023-12-04 DIAGNOSIS — M9902 Segmental and somatic dysfunction of thoracic region: Secondary | ICD-10-CM | POA: Diagnosis not present

## 2023-12-04 NOTE — Progress Notes (Signed)
Daily Session Note  Patient Details  Name: [name] MRN: [id number] Date of Birth: 07-24-1941 Referring Provider:   Flowsheet Row CARDIAC REHAB PHASE II ORIENTATION from 09/30/2023 in [facility] CARDIAC REHABILITATION  Referring Provider Dina Rich MD       Encounter Date: 12/04/2023  Check In:  Session Check In - 12/04/23 1045       Check-In   Supervising physician immediately available to respond to emergencies See telemetry face sheet for immediately available ER MD    Location [facility]    Staff Present Ross Ludwig, BS, Exercise Physiologist;Jessica Juanetta Gosling, MA, RCEP, CCRP, Dow Adolph, RN, BSN    Virtual Visit No    Medication changes reported     No    Fall or balance concerns reported    No    Warm-up and Cool-down Performed on first and last piece of equipment    Resistance Training Performed Yes    VAD Patient? No    PAD/SET Patient? No      Pain Assessment   Currently in Pain? No/denies    Pain Score 0-No pain    Multiple Pain Sites No             Capillary Blood Glucose: No results found for this or any previous visit (from the past 24 hours).    Social History   Tobacco Use  Smoking Status Never  Smokeless Tobacco Never    Goals Met:  Independence with exercise equipment Exercise tolerated well No report of concerns or symptoms today Strength training completed today  Goals Unmet:  Not Applicable  Comments: Pt able to follow exercise prescription today without complaint.  Will continue to monitor for progression.

## 2023-12-07 ENCOUNTER — Encounter (HOSPITAL_COMMUNITY)
Admission: RE | Admit: 2023-12-07 | Discharge: 2023-12-07 | Disposition: A | Discharge status: Home / Self Care | Payer: Medicare HMO | Source: Ambulatory Visit | Attending: Cardiology

## 2023-12-07 DIAGNOSIS — Z953 Presence of xenogenic heart valve: Secondary | ICD-10-CM | POA: Diagnosis not present

## 2023-12-07 NOTE — Progress Notes (Signed)
 Daily Session Note  Patient Details  Name: Dawn Lin MRN: [id number] Date of Birth: [birth year] Referring Provider:   Flowsheet Row CARDIAC REHAB PHASE II ORIENTATION from 09/30/2023 in [facility] CARDIAC REHABILITATION  Referring Provider Dina Rich MD       Encounter Date: 12/07/2023  Check In:  Session Check In - 12/07/23 1045       Check-In   Supervising physician immediately available to respond to emergencies See telemetry face sheet for immediately available ER MD    Location [facility]    Staff Present Ross Ludwig, BS, Exercise Physiologist;Jessica Juanetta Gosling, MA, RCEP, CCRP, Dow Adolph, RN, BSN    Virtual Visit No    Medication changes reported     No    Fall or balance concerns reported    No    Warm-up and Cool-down Performed on first and last piece of equipment    Resistance Training Performed Yes    VAD Patient? No    PAD/SET Patient? No      Pain Assessment   Currently in Pain? No/denies    Pain Score 0-No pain    Multiple Pain Sites No             Capillary Blood Glucose: No results found for this or any previous visit (from the past 24 hours).    Social History   Tobacco Use  Smoking Status Never  Smokeless Tobacco Never    Goals Met:  Independence with exercise equipment Exercise tolerated well No report of concerns or symptoms today Strength training completed today  Goals Unmet:  Not Applicable  Comments: Pt able to follow exercise prescription today without complaint.  Will continue to monitor for progression.

## 2023-12-09 ENCOUNTER — Encounter (HOSPITAL_COMMUNITY)
Admission: RE | Admit: 2023-12-09 | Discharge: 2023-12-09 | Disposition: A | Discharge status: Home / Self Care | Payer: Medicare HMO | Source: Ambulatory Visit | Attending: Cardiology

## 2023-12-09 ENCOUNTER — Encounter (HOSPITAL_COMMUNITY): Payer: Self-pay | Admitting: *Deleted

## 2023-12-09 DIAGNOSIS — Z953 Presence of xenogenic heart valve: Secondary | ICD-10-CM | POA: Diagnosis not present

## 2023-12-09 NOTE — Progress Notes (Signed)
 Daily Session Note  Patient Details  Name: Dawn Lin MRN: [id number] Date of Birth: [birth year] Referring Provider:   Flowsheet Row CARDIAC REHAB PHASE II ORIENTATION from 09/30/2023 in [facility] CARDIAC REHABILITATION  Referring Provider Dina Rich MD       Encounter Date: 12/09/2023  Check In:  Session Check In - 12/09/23 1028       Check-In   Supervising physician immediately available to respond to emergencies See telemetry face sheet for immediately available MD    Location [facility]    Staff Present Ross Ludwig, BS, Exercise Physiologist;Jessica Juanetta Gosling, MA, RCEP, CCRP, CCET;Brittany Foley, BSN, RN    Virtual Visit No    Medication changes reported     No    Fall or balance concerns reported    No    Tobacco Cessation No Change    Warm-up and Cool-down Performed on first and last piece of equipment    Resistance Training Performed Yes    VAD Patient? No    PAD/SET Patient? No      Pain Assessment   Currently in Pain? No/denies    Pain Score 0-No pain    Multiple Pain Sites No             Capillary Blood Glucose: No results found for this or any previous visit (from the past 24 hours).    Social History   Tobacco Use  Smoking Status Never  Smokeless Tobacco Never    Goals Met:  Independence with exercise equipment Exercise tolerated well No report of concerns or symptoms today Strength training completed today  Goals Unmet:  Not Applicable  Comments: Pt able to follow exercise prescription today without complaint.  Will continue to monitor for progression.

## 2023-12-09 NOTE — Progress Notes (Signed)
 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 (Patient taking differently: Take 500-1,000 mg by mouth every 6 (six) hours as needed for moderate pain (pain score 4-6).), Disp: 30 tablet, Rfl: 0   Alpha-D-Galactosidase (BEANO PO), Take 2 capsules by mouth daily as needed (bloating)., Disp: , Rfl:    amLODipine (NORVASC) 10 MG tablet, Take 1 tablet (10 mg total) by mouth daily., Disp: 90 tablet, Rfl: 3   Ascorbic Acid (VITAMIN C PO), Take 564 mg by mouth daily. 282 mg each, Disp: , Rfl:    aspirin EC 81 MG tablet, Take 81 mg by mouth daily., Disp: , Rfl:    azithromycin (ZITHROMAX) 500 MG tablet, Take 1 tablet (500 mg total) by mouth as directed. Take one tablet 1 hour before any dental work including cleanings., Disp: 6 tablet, Rfl: 12   Calcium Carb-Cholecalciferol (CALCIUM 500 + D PO), Take 1 tablet by mouth 2 (two) times daily., Disp: , Rfl:    cyanocobalamin (VITAMIN B12) 1000 MCG tablet, Take 1,000 mcg by mouth daily. Sublingual, Disp: , Rfl:    diclofenac Sodium (VOLTAREN ARTHRITIS PAIN) 1 % GEL, Apply 2 g topically daily as needed (Back pain)., Disp: , Rfl:    fexofenadine (ALLEGRA) 180 MG tablet, Take 1 tablet (180 mg total) by mouth daily as needed for allergies. (Patient taking differently: Take 180 mg by  mouth daily.), Disp: 90 tablet, Rfl: 3   folic acid (FOLVITE) 1 MG tablet, Take 2 mg by mouth daily., Disp: , Rfl:    furosemide (LASIX) 20 MG tablet, Take 1 tablet (20 mg total) by mouth daily as needed (swelling)., Disp: 90 tablet, Rfl: 3   guaifenesin (HUMIBID E) 400 MG TABS tablet, Take 400-1,200 mg by mouth daily as needed (Drainage)., Disp: , Rfl:    HYDROcodone bit-homatropine (HYCODAN) 5-1.5 MG/5ML syrup, Take 5 mLs by mouth at bedtime as needed for cough., Disp: , Rfl:    ibuprofen (ADVIL) 200 MG tablet, Take 200-400 mg by mouth every 6 (six) hours as needed for moderate pain (pain score 4-6)., Disp: , Rfl:    ipratropium (ATROVENT) 0.03 % nasal spray, Place 1 spray into both nostrils 3 (three) times daily as needed for rhinitis. (Patient taking differently: Place 1 spray into both nostrils daily.), Disp: 90 mL, Rfl: 3   losartan (COZAAR) 25 MG tablet, Take 0.5 tablets (12.5 mg total) by mouth daily., Disp: 45 tablet, Rfl: 3   meclizine (ANTIVERT) 12.5 MG tablet, Take 12.5 mg by mouth every 6 (six) hours as needed for dizziness., Disp: , Rfl:    Multiple Vitamin (MULTIVITAMIN WITH MINERALS) TABS tablet, Take 2 tablets by mouth daily. Gummy, Disp: , Rfl:    potassium chloride SA (KLOR-CON M) 20 MEQ tablet, Take 1 tablet (20 mEq total) by mouth as needed (when taking the lasix for swelling)., Disp: 90 tablet, Rfl: 3  predniSONE (DELTASONE) 5 MG tablet, Take 5 mg by mouth daily with breakfast., Disp: , Rfl:    Propylene Glycol, PF, (SYSTANE COMPLETE PF) 0.6 % SOLN, Place 1 drop into both eyes daily as needed (Dry eyes)., Disp: , Rfl:    rosuvastatin (CRESTOR) 5 MG tablet, Take 1 tablet (5 mg total) by mouth every morning., Disp: 90 tablet, Rfl: 3   simethicone (MYLICON) 80 MG chewable tablet, Chew 160 mg by mouth every 6 (six) hours as needed for flatulence., Disp: , Rfl:    Zinc 30 MG CAPS, Take 30 mg by mouth 2 (two) times daily., Disp: , Rfl:   Past Medical History: Past Medical History:   Diagnosis Date   Anemia    Anxiety    Essential hypertension, benign since the 1900's   Family history of adverse reaction to anesthesia    Children - N/V   History of blood transfusion    History of lung cancer    Lung adenocarcinoma s/p resection 2018   Hyperlipidemia    Other osteoporosis    Other thalassemia (HCC)    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 Lynnette Caffey and Dr. Leafy Ro   Severe aortic stenosis     Tobacco Use: Social History   Tobacco Use  Smoking Status Never  Smokeless Tobacco Never    Labs: Review Flowsheet  More data exists      Latest Ref Rng & Units 04/21/2017 11/13/2017 02/18/2018 08/06/2023 09/15/2023  Labs for ITP Cardiac and Pulmonary Rehab  Cholestrol 100 - 199 mg/dL - - 324  - -  LDL (calc) 0 - 99 mg/dL - - 68  - -  Direct LDL 0 - 99 mg/dL - 401  - - -  HDL-C >02 mg/dL - - 90  - -  Trlycerides 0 - 149 mg/dL - - 50  - -  PH, Arterial 7.35 - 7.45 7.405  - - 7.378  -  PCO2 arterial 32 - 48 mmHg 40.9  - - 40.7  -  Bicarbonate 20.0 - 28.0 mmol/L 25.1  - - 23.8  23.9  -  TCO2 22 - 32 mmol/L - - - 25  25  24    Acid-base deficit 0.0 - 2.0 mmol/L - - - 2.0  1.0  -  O2 Saturation % 95.3  - - 71  93  -    Details       Multiple values from one day are sorted in reverse-chronological order         Capillary Blood Glucose: Lab Results  Component Value Date   GLUCAP 88 04/27/2017   GLUCAP 133 (H) 04/26/2017   GLUCAP 133 (H) 04/22/2017   GLUCAP 122 (H) 04/22/2017   GLUCAP 126 (H) 04/21/2017     Exercise Target Goals: Exercise Program Goal: Individual exercise prescription set using results from initial 6 min walk test and THRR while considering  patient's activity barriers and safety.   Exercise Prescription Goal: Starting with aerobic activity 30 plus minutes a day, 3 days per week for initial exercise prescription. Provide home exercise prescription and  guidelines that participant acknowledges understanding prior to discharge.  Activity Barriers & Risk Stratification:   6 Minute Walk:   Oxygen Initial Assessment:   Oxygen Re-Evaluation:   Oxygen Discharge (Final Oxygen Re-Evaluation):   Initial Exercise Prescription:   Perform Capillary Blood Glucose checks as needed.  Exercise Prescription Changes:   Exercise  Prescription Changes     Row Name 10/12/23 1200 10/28/23 1200 11/09/23 1300 11/18/23 1100 11/30/23 1200     Response to Exercise   Blood Pressure (Admit) 122/60 110/56 120/60 102/62 126/60   Blood Pressure (Exercise) 140/60 -- -- -- --   Blood Pressure (Exit) 124/60 122/62 122/60 124/62 120/60   Heart Rate (Admit) 72 bpm 112 bpm 65 bpm 71 bpm 68 bpm   Heart Rate (Exercise) 128 bpm 142 bpm 141 bpm 105 bpm 112 bpm   Heart Rate (Exit) 98 bpm 98 bpm 123 bpm 99 bpm 94 bpm   Rating of Perceived Exertion (Exercise) 12 12 12 11 12    Duration Continue with 30 min of aerobic exercise without signs/symptoms of physical distress. Continue with 30 min of aerobic exercise without signs/symptoms of physical distress. Continue with 30 min of aerobic exercise without signs/symptoms of physical distress. Continue with 30 min of aerobic exercise without signs/symptoms of physical distress. Continue with 30 min of aerobic exercise without signs/symptoms of physical distress.   Intensity THRR unchanged THRR unchanged THRR unchanged THRR unchanged THRR unchanged     Progression   Progression Continue to progress workloads to maintain intensity without signs/symptoms of physical distress. Continue to progress workloads to maintain intensity without signs/symptoms of physical distress. Continue to progress workloads to maintain intensity without signs/symptoms of physical distress. Continue to progress workloads to maintain intensity without signs/symptoms of physical distress. Continue to progress workloads to maintain intensity without  signs/symptoms of physical distress.     Resistance Training   Training Prescription Yes Yes Yes Yes Yes   Weight 3 lbs 3lbs 3 3 3    Reps 10-15 10-15 10-15 10-15 10-15     Treadmill   MPH 1.3 1.7 1.8 1.5 1.7   Grade 0.5 0.5 0.5 0.5 1.5   Minutes 15 15 15 15 15    METs 2.08 2.42 2.5 2.25 2.62     NuStep   Level 1 1 2 2 4    SPM 50 114 109 112 100   Minutes 15 15 15 15 15    METs 1.9 1.9 1.9 1.8 1.9     Home Exercise Plan   Plans to continue exercise at -- -- -- Home (comment)  walking at church, staff videos Home (comment)   Frequency -- -- -- Add 2 additional days to program exercise sessions. Add 2 additional days to program exercise sessions.   Initial Home Exercises Provided -- -- -- 11/18/23 --     Oxygen   Maintain Oxygen Saturation 88% or higher 88% or higher 88% or higher 88% or higher 88% or higher            Exercise Comments:   Exercise Goals and Review:   Exercise Goals Re-Evaluation :  Exercise Goals Re-Evaluation     Row Name 10/16/23 0758 10/27/23 1349 10/30/23 0735 11/18/23 1132 11/20/23 0815     Exercise Goal Re-Evaluation   Exercise Goals Review Increase Physical Activity;Increase Strength and Stamina;Understanding of Exercise Prescription Increase Physical Activity;Increase Strength and Stamina;Able to understand and use Dyspnea scale;Able to understand and use rate of perceived exertion (RPE) scale;Knowledge and understanding of Target Heart Rate Range (THRR);Able to check pulse independently;Understanding of Exercise Prescription Increase Physical Activity;Increase Strength and Stamina;Understanding of Exercise Prescription Increase Physical Activity;Increase Strength and Stamina;Understanding of Exercise Prescription Increase Physical Activity;Increase Strength and Stamina;Understanding of Exercise Prescription   Comments Dawn Lin has just started rehab and is tolerating exerice well. She is getting use to the treadmill and needs  queing to take big steps  instead of small quick steps. Will continue to monitor and progress as able. Dawn Lin is doing well in rehab so far.  She is already starting to feel like she is making improvements.  She has already started to add in more stretching and balance practice at home.  She is planning to get some new weights to use at home. Reviewed home exercise with pt today.  Pt plans to walk at church with daughter and friends for exercise.  Reviewed THR, pulse, RPE, sign and symptoms, pulse oximetery and when to call 911 or MD.  Also discussed weather considerations and indoor options.  Pt voiced understanding.  Dawn Lin wants to continue to be active and get back to helping at church Dawn Lin is doing well in rehab and is tolerating exercise well. She has increased her speed on the treadmill to 1.7 with a 0.5 incline. Her heart rate did reach higher then THR onthe nustep and will need to slow down SPM. Will continue to monitor and progress as able/ Dawn Lin is doing well in rehab.  She is starting to notice that her stamina is starting to improve and now she can do full time on the treadmill.  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  Reviewed home exercise with pt today.  Pt plans to walk at church with friends for exercise.  We also talked about trying to use the staff videos as well.  Reviewed THR, pulse, RPE, sign and symptoms, pulse oximetery and when to call 911 or MD.  Also discussed weather considerations and indoor options.  Pt voiced understanding. Dawn Lin is doing well in rehab and tolerating exercise well. She is doing great on the nustep and has high SPM, we plan to move up to level 3 next visit. Will continue to monitor and progress as able.   Expected Outcomes continue to attend rehab Short: Start to add in cardio at home Long: Continue to improve stamina Monitor RPE and HR when on the nustep   continue to attend rehab Short: Start to add in more walking at home and try out staff videos Long: Continue to exercise  independently continue to attend rehab             Discharge Exercise Prescription (Final Exercise Prescription Changes):  Exercise Prescription Changes - 11/30/23 1200       Response to Exercise   Blood Pressure (Admit) 126/60    Blood Pressure (Exit) 120/60    Heart Rate (Admit) 68 bpm    Heart Rate (Exercise) 112 bpm    Heart Rate (Exit) 94 bpm    Rating of Perceived Exertion (Exercise) 12    Duration Continue with 30 min of aerobic exercise without signs/symptoms of physical distress.    Intensity THRR unchanged      Progression   Progression Continue to progress workloads to maintain intensity without signs/symptoms of physical distress.      Resistance Training   Training Prescription Yes    Weight 3    Reps 10-15      Treadmill   MPH 1.7    Grade 1.5    Minutes 15    METs 2.62      NuStep   Level 4    SPM 100    Minutes 15    METs 1.9      Home Exercise Plan   Plans to continue exercise at Home (comment)    Frequency Add 2 additional days  to program exercise sessions.      Oxygen   Maintain Oxygen Saturation 88% or higher             Nutrition:  Target Goals: Understanding of nutrition guidelines, daily intake of sodium 1500mg , cholesterol 200mg , calories 30% from fat and 7% or less from saturated fats, daily to have 5 or more servings of fruits and vegetables.  Biometrics:    Nutrition Therapy Plan and Nutrition Goals:   Nutrition Assessments:  MEDIFICTS Score Key: >=70 Need to make dietary changes  40-70 Heart Healthy Diet <= 40 Therapeutic Level Cholesterol Diet  Flowsheet Row CARDIAC REHAB PHASE II ORIENTATION from 09/30/2023 in [facility] CARDIAC REHABILITATION  Picture Your Plate Total Score on Admission 38      Picture Your Plate Scores: <11 Unhealthy dietary pattern with much room for improvement. 41-50 Dietary pattern unlikely to meet recommendations for good health and room for improvement. 51-60 More healthful  dietary pattern, with some room for improvement.  >60 Healthy dietary pattern, although there may be some specific behaviors that could be improved.    Nutrition Goals Re-Evaluation:  Nutrition Goals Re-Evaluation     Row Name 10/27/23 1401 11/18/23 1138           Goals   Nutrition Goal Heart Healthy Diet Short Continue to cut back on bad stuff Long: continue to work on diet      Comment Dawn Lin is working on her diet.  She has not cut out the bad stuff yet, but has started to add the good variety of fruits and vegetables back in.  She used to do weight watchers but no longer follows that.  She does use their exchange theory to help with her diet. Dawn Lin is doing well in rehab.  She says her diet is not the best.  She feels hunger all the time.  She does not feel that she is getting enough protein.  She is eating beans and lots of fiber with greens and whole grain.  We talked about supplmenting with protein and getting snacks with protein.  She also has low hemoglobin on occassion.  She takes folic acid and predisone to help.  We talked about predisone can make her hungry too. She does not eat a lot just frequently.  She also eats out frequently and was encouraged to try to limit that and watch sodium in food.      Expected Outcome Short Continue to cut back on bad stuff Long: continue to work on diet Short: Try to supplement with more protein and drink before eating when hungry Long: Continue to watch sodium when eating out               Nutrition Goals Discharge (Final Nutrition Goals Re-Evaluation):  Nutrition Goals Re-Evaluation - 11/18/23 1138       Goals   Nutrition Goal Short Continue to cut back on bad stuff Long: continue to work on diet    Comment Dawn Lin is doing well in rehab.  She says her diet is not the best.  She feels hunger all the time.  She does not feel that she is getting enough protein.  She is eating beans and lots of fiber with greens and whole grain.  We talked about  supplmenting with protein and getting snacks with protein.  She also has low hemoglobin on occassion.  She takes folic acid and predisone to help.  We talked about predisone can make her hungry too. She does not  eat a lot just frequently.  She also eats out frequently and was encouraged to try to limit that and watch sodium in food.    Expected Outcome Short: Try to supplement with more protein and drink before eating when hungry Long: Continue to watch sodium when eating out             Psychosocial: Target Goals: Acknowledge presence or absence of significant depression and/or stress, maximize coping skills, provide positive support system. Participant is able to verbalize types and ability to use techniques and skills needed for reducing stress and depression.  Initial Review & Psychosocial Screening:   Quality of Life Scores:  Scores of 19 and below usually indicate a poorer quality of life in these areas.  A difference of  2-3 points is a clinically meaningful difference.  A difference of 2-3 points in the total score of the Quality of Life Index has been associated with significant improvement in overall quality of life, self-image, physical symptoms, and general health in studies assessing change in quality of life.  PHQ-9: Review Flowsheet  More data exists      10/27/2023 09/30/2023 11/28/2011 10/31/2011 07/28/2011  Depression screen PHQ 2/9  Decreased Interest 0 1 0 0 0  Down, Depressed, Hopeless 0 0 0 0 1  PHQ - 2 Score 0 1 0 0 1  Altered sleeping 0 1 - - -  Tired, decreased energy 2 2 - - -  Change in appetite 0 2 - - -  Feeling bad or failure about yourself  0 0 - - -  Trouble concentrating 1 1 - - -  Moving slowly or fidgety/restless 0 0 - - -  Suicidal thoughts 0 0 - - -  PHQ-9 Score 3 7 - - -  Difficult doing work/chores Somewhat difficult Somewhat difficult - - -   Interpretation of Total Score  Total Score Depression Severity:  1-4 = Minimal depression, 5-9 =  Mild depression, 10-14 = Moderate depression, 15-19 = Moderately severe depression, 20-27 = Severe depression   Psychosocial Evaluation and Intervention:   Psychosocial Re-Evaluation:  Psychosocial Re-Evaluation     Row Name 10/27/23 1354 11/18/23 1137           Psychosocial Re-Evaluation   Current issues with History of Depression;Current Stress Concerns History of Depression;Current Stress Concerns      Comments Dawn Lin is doing well in rehab.  She is eager to get moving again and to be able to get back to helping out at church.  She is planning to start to walk with her daughter and friends at church to help.  Reassess PHQ today and it has improved from a 7 to a 3.  She says that she is not depressed, just frustrated at times that she can't go and do.  She has a sister that is bed ridden and her nephew cares for her sister.  She feels blessed with what she does have. She is sleeping better overall, just has frequent trips to bathroom with her bladder. Dawn Lin has three sons.  Two are great and she has one that has made poor lifestyle decisions that has left him with bilateral leg amputation and now having seziures.  She does not feel that she cannot care for him as she can't watch him destroy himself.  His dad would not take him back.  He is currently at the [facility] but does not want to give up his income to pay for habits.  She made the  hard choice to say yes to herself.  Her oldest son looks after his brother and sets up his arrangements. Dawn Lin is doing well in rehab.  She is adjusting to stressors.  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  She has a great support system to that hold her accountable.  She just wishes she was more motivated.  She is looking forward to the nicer weather around the corner. She told her sister that she misses herself.  She wants to work on her exercise and diet.      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  Long; Conitnue to move more for mental boost      Interventions Encouraged to attend Cardiac Rehabilitation for the exercise;Stress management education --      Continue Psychosocial Services  Follow up required by staff --               Psychosocial Discharge (Final Psychosocial Re-Evaluation):  Psychosocial Re-Evaluation - 11/18/23 1137       Psychosocial Re-Evaluation   Current issues with History of Depression;Current Stress Concerns    Comments Dawn Lin is doing well in rehab.  She is adjusting to stressors.  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  She has a great support system to that hold her accountable.  She just wishes she was more motivated.  She is looking forward to the nicer weather around the corner. She told her sister that she misses herself.  She wants to work on her exercise and diet.    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  Long; Conitnue to move more for mental boost             Vocational Rehabilitation: Provide vocational rehab assistance to qualifying candidates.   Vocational Rehab Evaluation & Intervention:   Education: Education Goals: Education classes will be provided on a weekly basis, covering required topics. Participant will state understanding/return demonstration of topics presented.  Learning Barriers/Preferences:   Education Topics: Hypertension, Hypertension Reduction -Define heart disease and high blood pressure. Discus how high blood pressure affects the body and ways to reduce high blood pressure. Flowsheet Row CARDIAC REHAB PHASE II EXERCISE from 11/25/2023 in [place] CARDIAC REHABILITATION  Date 10/28/23  Educator jh  Instruction Review Code 1- Verbalizes  Understanding       Exercise and Your Heart -Discuss why it is important to exercise, the FITT principles of exercise, normal and abnormal responses to exercise, and how to exercise safely. Flowsheet Row CARDIAC REHAB PHASE II EXERCISE from 11/25/2023 in [place] CARDIAC REHABILITATION  Date 11/25/23  Educator [facility]  Instruction Review Code 1- Verbalizes Understanding       Angina -Discuss definition of angina, causes of angina, treatment of angina, and how to decrease risk of having angina.   Cardiac Medications -Review what the following cardiac medications are used for, how they affect the body, and side effects that may occur when taking the medications.  Medications include Aspirin, Beta blockers, calcium channel blockers, ACE Inhibitors, angiotensin receptor blockers, diuretics, digoxin, and antihyperlipidemics.   Congestive Heart Failure -Discuss the definition of CHF, how  to live with CHF, the signs and symptoms of CHF, and how keep track of weight and sodium intake.   Heart Disease and Intimacy -Discus the effect sexual activity has on the heart, how changes occur during intimacy as we age, and safety during sexual activity.   Smoking Cessation / COPD -Discuss different methods to quit smoking, the health benefits of quitting smoking, and the definition of COPD.   Nutrition I: Fats -Discuss the types of cholesterol, what cholesterol does to the heart, and how cholesterol levels can be controlled.   Nutrition II: Labels -Discuss the different components of food labels and how to read food label   Heart Parts/Heart Disease and PAD -Discuss the anatomy of the heart, the pathway of blood circulation through the heart, and these are affected by heart disease.   Stress I: Signs and Symptoms -Discuss the causes of stress, how stress may lead to anxiety and depression, and ways to limit stress. Flowsheet Row CARDIAC REHAB PHASE II EXERCISE from 11/25/2023 in [place]  CARDIAC REHABILITATION  Date 10/21/23  Educator hb  Instruction Review Code 1- Verbalizes Understanding       Stress II: Relaxation -Discuss different types of relaxation techniques to limit stress.   Warning Signs of Stroke / TIA -Discuss definition of a stroke, what the signs and symptoms are of a stroke, and how to identify when someone is having stroke.   Knowledge Questionnaire Score:   Core Components/Risk Factors/Patient Goals at Admission:   Core Components/Risk Factors/Patient Goals Review:   Goals and Risk Factor Review     Row Name 10/27/23 1403 11/18/23 1141           Core Components/Risk Factors/Patient Goals Review   Personal Goals Review Improve shortness of breath with ADL's;Weight Management/Obesity;Hypertension;Heart Failure;Lipids Improve shortness of breath with ADL's;Weight Management/Obesity;Hypertension;Heart Failure;Lipids      Review Dawn Lin is doing well in rehab.  She is working on maintaining her weight.  She has had any heart failure symptoms.  Her pressures are doing well.  She was doing well with checking them at home, but has gotten away from it with the weather.  She is also starting to breath better and feel better overall. Dawn Lin is doing well in rehab.  She has been maintaining her weight, but she wants to lose.  She is hungry a lot and gives into the cookies.  She does eat out but tries to make good choices.  We talked about watching her sodium intake.  She does not notices swelling, but does feel like she retains fluid on occassion and will take her lasix and potassium to help.  Her pressures are doing well and is now on losartan as well.  She feels likes she is well managed.  Her breathing feels like it is getting better.  She notices it most when she is bending.      Expected Outcomes Short: Continue to montior heart failure Long: Continue to montior risk factors. Short: Conitnue to watch weight Long; Continue to manage heart failure.                Core Components/Risk Factors/Patient Goals at Discharge (Final Review):   Goals and Risk Factor Review - 11/18/23 1141       Core Components/Risk Factors/Patient Goals Review   Personal Goals Review Improve shortness of breath with ADL's;Weight Management/Obesity;Hypertension;Heart Failure;Lipids    Review Dawn Lin is doing well in rehab.  She has been maintaining her weight, but she wants to lose.  She is hungry a lot and gives into the cookies.  She does eat out but tries to make good choices.  We talked about watching her sodium intake.  She does not notices swelling, but does feel like she retains fluid on occassion and will take her lasix and potassium to help.  Her pressures are doing well and is now on losartan as well.  She feels likes she is well managed.  Her breathing feels like it is getting better.  She notices it most when she is bending.    Expected Outcomes Short: Conitnue to watch weight Long; Continue to manage heart failure.             ITP Comments:  ITP Comments     Row Name 10/13/23 0805 11/11/23 0959 12/09/23 0829       ITP Comments 30 day review completed. ITP sent to Dr. Dina Rich, Medical Director of Cardiac Rehab. Continue with ITP unless changes are made by physician.  Still new to program. 30 day review completed. ITP sent to Dr. Dina Rich, Medical Director of Cardiac Rehab. Continue with ITP unless changes are made by physician. 30 day review completed. ITP sent to Dr. Dina Rich, Medical Director of Cardiac Rehab. Continue with ITP unless changes are made by physician.              Comments: 30 day review

## 2023-12-11 ENCOUNTER — Encounter (HOSPITAL_COMMUNITY)
Admission: RE | Admit: 2023-12-11 | Discharge: 2023-12-11 | Disposition: A | Discharge status: Home / Self Care | Payer: Medicare HMO | Source: Ambulatory Visit | Attending: Cardiology | Admitting: Cardiology

## 2023-12-11 DIAGNOSIS — Z952 Presence of prosthetic heart valve: Secondary | ICD-10-CM

## 2023-12-11 DIAGNOSIS — Z953 Presence of xenogenic heart valve: Secondary | ICD-10-CM | POA: Diagnosis not present

## 2023-12-11 NOTE — Patient Instructions (Addendum)
 Discharge Patient Instructions  Patient Details  Name: Dawn Lin MRN: [id number] Date of Birth: September 21, 1941 Referring Provider:  Ignatius Specking, MD   Number of Visits: 8  Reason for Discharge:  Patient reached a stable level of exercise. Patient independent in their exercise. Patient has met program and personal goals.  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  Initial Exercise Prescription:  Initial Exercise Prescription - 09/30/23 1500       Date of Initial Exercise RX and Referring Provider   Date 09/30/23    Referring Provider Dina Rich MD      Oxygen   Oxygen Continuous    Maintain Oxygen Saturation 88% or higher      Treadmill   MPH 1.2    Grade 0    Minutes 15    METs 1.8      NuStep   Level 1    SPM 50    Minutes 15    METs 1.4      Prescription Details   Frequency (times per week) 3    Duration Progress to 30 minutes of continuous aerobic without signs/symptoms of physical distress      Intensity   THRR 40-80% of Max Heartrate 105-128    Ratings of Perceived Exertion 11-13    Perceived Dyspnea 0-4      Resistance Training   Training Prescription Yes    Weight 3 lbs    Reps 10-15             Discharge Exercise Prescription (Final Exercise Prescription Changes):  Exercise Prescription Changes - 11/30/23 1200       Response to Exercise   Blood Pressure (Admit) 126/60    Blood Pressure (Exit) 120/60    Heart Rate (Admit) 68 bpm    Heart Rate (Exercise) 112 bpm    Heart Rate (Exit) 94 bpm    Rating of Perceived Exertion (Exercise) 12    Duration Continue with 30 min of aerobic exercise without signs/symptoms of physical distress.    Intensity THRR unchanged      Progression   Progression Continue to progress workloads to maintain intensity without signs/symptoms  of physical distress.      Resistance Training   Training Prescription Yes    Weight 3    Reps 10-15      Treadmill   MPH 1.7    Grade 1.5    Minutes 15    METs 2.62      NuStep   Level 4    SPM 100    Minutes 15    METs 1.9      Home Exercise Plan   Plans to continue exercise at Home (comment)    Frequency Add 2 additional days to program exercise sessions.      Oxygen   Maintain Oxygen Saturation 88% or higher             Functional Capacity:  6 Minute Walk     Row Name 09/30/23 1534 12/09/23 1314       6 Minute Walk   Phase Initial Discharge    Distance 840 feet 1480 feet    Distance Feet Change -- 640 ft    Walk Time 6 minutes 6 minutes    # of Rest Breaks 1  25 second sitting break 0    MPH 1.6  2.8    METS 1.14 2.77    RPE 12 13    Perceived Dyspnea  1 1    VO2 Peak 4.93 9.68    Symptoms No No    Resting HR 82 bpm 66 bpm    Resting BP 140/60 118/60    Resting Oxygen Saturation  97 % --    Exercise Oxygen Saturation  during 6 min walk 95 % --    Max Ex. HR 91 bpm 115 bpm    Max Ex. BP 146/60 140/60    2 Minute Post BP 136/60 132/60

## 2023-12-11 NOTE — Progress Notes (Signed)
 Daily Session Note  Patient Details  Name: Dawn Lin MRN: [id number] Date of Birth: 05-29-1941 Referring Provider:   Flowsheet Row CARDIAC REHAB PHASE II ORIENTATION from 09/30/2023 in [facility] C[facility]AC [facility]  Referring Provider Dina Rich MD       Encounter Date: 12/11/2023  Check In:  Session Check In - 12/11/23 1045       Check-In   Supervising physician immediately available to respond to emergencies See telemetry face sheet for immediately available MD    Location [facility]    Staff Present Desiree Lucy, BSN, RN, Sherlyn Hay, MA, RCEP, CCRP, CCET    Virtual Visit No    Medication changes reported     No    Fall or balance concerns reported    No    Tobacco Cessation No Change    Warm-up and Cool-down Performed on first and last piece of equipment    Resistance Training Performed Yes    VAD Patient? No    PAD/SET Patient? No      Pain Assessment   Currently in Pain? No/denies             Capillary Blood Glucose: No results found for this or any previous visit (from the past 24 hours).    Social History   Tobacco Use  Smoking Status Never  Smokeless Tobacco Never    Goals Met:  Independence with exercise equipment Exercise tolerated well Personal goals reviewed No report of concerns or symptoms today Strength training completed today  Goals Unmet:  Not Applicable  Comments:  Dawn Lin graduated today from  rehab with 31 sessions completed.  Details of the patient's exercise prescription and what She needs to do in order to continue the prescription and progress were discussed with patient.  Patient was given a copy of prescription and goals.  Patient verbalized understanding. Dawn Lin plans to continue to exercise by walking with friends at church.

## 2023-12-11 NOTE — Progress Notes (Signed)
 Discharge Progress Report  Patient Details  Name: Dawn Lin MRN: [id number] Date of Birth: 29-Jun-1941 Referring Provider:   Flowsheet Row CARDIAC REHAB PHASE II ORIENTATION from 09/30/2023 in [facility]AC RE[facility]  Referring Provider Dina Rich MD        Number of Visits: 31  Reason for Discharge:  Patient reached a stable level of exercise. Patient independent in their exercise. Patient has met program and personal goals. Early Exit:  Personal  Smoking History:  Social History   Tobacco Use  Smoking Status Never  Smokeless Tobacco Never    Diagnosis:  S/P TAVR (transcatheter aortic valve replacement)  ADL [facility]:   Initial Exercise Prescription:  Initial Exercise Prescription - 09/30/23 1500       Date of Initial Exercise RX and Referring Provider   Date 09/30/23    Referring Provider Dina Rich MD      Oxygen   Oxygen Continuous    Maintain Oxygen Saturation 88% or higher      Treadmill   MPH 1.2    Grade 0    Minutes 15    METs 1.8      NuStep   Level 1    SPM 50    Minutes 15    METs 1.4      Prescription Details   Frequency (times per week) 3    Duration Progress to 30 minutes of continuous aerobic without signs/symptoms of physical distress      Intensity   THRR 40-80% of Max Heartrate 105-128    Ratings of Perceived Exertion 11-13    Perceived Dyspnea 0-4      Resistance Training   Training Prescription Yes    Weight 3 lbs    Reps 10-15             Discharge Exercise Prescription (Final Exercise Prescription Changes):  Exercise Prescription Changes - 11/30/23 1200       Response to Exercise   Blood Pressure (Admit) 126/60    Blood Pressure (Exit) 120/60    Heart Rate (Admit) 68 bpm    Heart Rate (Exercise) 112 bpm    Heart Rate (Exit) 94 bpm    Rating of Perceived Exertion (Exercise) 12    Duration Continue with 30 min of aerobic exercise without signs/symptoms of physical distress.     Intensity THRR unchanged      Progression   Progression Continue to progress workloads to maintain intensity without signs/symptoms of physical distress.      Resistance Training   Training Prescription Yes    Weight 3    Reps 10-15      Treadmill   MPH 1.7    Grade 1.5    Minutes 15    METs 2.62      NuStep   Level 4    SPM 100    Minutes 15    METs 1.9      Home Exercise Plan   Plans to continue exercise at Home (comment)    Frequency Add 2 additional days to program exercise sessions.      Oxygen   Maintain Oxygen Saturation 88% or higher             Functional Capacity:  6 Minute Walk     Row Name 09/30/23 1534 12/09/23 1314       6 Minute Walk   Phase Initial Discharge    Distance 840 feet 1480 feet    Distance Feet Change -- 640 ft  Walk Time 6 minutes 6 minutes    # of Rest Breaks 1  25 second sitting break 0    MPH 1.6 2.8    METS 1.14 2.77    RPE 12 13    Perceived Dyspnea  1 1    VO2 Peak 4.93 9.68    Symptoms No No    Resting HR 82 bpm 66 bpm    Resting BP 140/60 118/60    Resting Oxygen Saturation  97 % --    Exercise Oxygen Saturation  during 6 min walk 95 % --    Max Ex. HR 91 bpm 115 bpm    Max Ex. BP 146/60 140/60    2 Minute Post BP 136/60 132/60             Psychological, QOL, Others - Outcomes: PHQ 2/9:    12/09/2023    1:31 PM 10/27/2023    1:55 PM 09/30/2023    3:17 PM 11/28/2011   11:36 AM 10/31/2011   11:41 AM  Depression screen PHQ 2/9  Decreased Interest 0 0 1 0 0  Down, Depressed, Hopeless 0 0 0 0 0  PHQ - 2 Score 0 0 1 0 0  Altered sleeping 1 0 1    Tired, decreased energy 2 2 2     Change in appetite 2 0 2    Feeling bad or failure about yourself  0 0 0    Trouble concentrating 0 1 1    Moving slowly or fidgety/restless 0 0 0    Suicidal thoughts 0 0 0    PHQ-9 Score 5 3 7     Difficult doing work/chores Not difficult at all Somewhat difficult Somewhat difficult      Quality of Life:  Quality of  Life - 12/09/23 1333       Quality of Life Scores   Health/Function Pre 19.32 %    Health/Function Post 22.86 %    Health/Function % Change 18.32 %    Socioeconomic Pre 24.75 %    Socioeconomic Post 24 %    Socioeconomic % Change  -3.03 %    Psych/Spiritual Pre 21.07 %    Psych/Spiritual Post 21.71 %    Psych/Spiritual % Change 3.04 %    Family Pre 21 %    Family Post 22.5 %    Family % Change 7.14 %    GLOBAL Pre 20.98 %    GLOBAL Post 22.81 %    GLOBAL % Change 8.72 %               Nutrition & Weight - Outcomes:  Pre Biometrics - 09/30/23 1540       Pre Biometrics   Height 4\' 11"  (1.499 m)    Weight 58.8 kg    Waist Circumference 34 inches    Hip Circumference 40 inches    Waist to Hip Ratio 0.85 %    BMI (Calculated) 26.17    Grip Strength 18.9 kg    Single Leg Stand 2 seconds               Goals reviewed with patient; copy given to patient.

## 2023-12-11 NOTE — Progress Notes (Signed)
 Cardiac Individual Treatment Plan  Patient Details  Name: Dawn Lin MRN: [id number] Date of Birth: 01-13-1941 Referring Provider:   Flowsheet Row CARDIAC REHAB PHASE II ORIENTATION from 09/30/2023 in [facility] CARDIAC REHABILITATION  Referring Provider Dina Rich MD       Initial Encounter Date:  Flowsheet Row CARDIAC REHAB PHASE II ORIENTATION from 09/30/2023 in [place] CARDIAC REHABILITATION  Date 09/30/23       Visit Diagnosis: S/P TAVR (transcatheter aortic valve replacement)  Patient's Home Medications on Admission:  Current Outpatient Medications:    acetaminophen (TYLENOL) 500 MG tablet, Take 1 tablet (500 mg total) by mouth every 6 (six) hours as needed. (Patient taking differently: Take 500-1,000 mg by mouth every 6 (six) hours as needed for moderate pain (pain score 4-6).), Disp: 30 tablet, Rfl: 0   Alpha-D-Galactosidase (BEANO PO), Take 2 capsules by mouth daily as needed (bloating)., Disp: , Rfl:    amLODipine (NORVASC) 10 MG tablet, Take 1 tablet (10 mg total) by mouth daily., Disp: 90 tablet, Rfl: 3   Ascorbic Acid (VITAMIN C PO), Take 564 mg by mouth daily. 282 mg each, Disp: , Rfl:    aspirin EC 81 MG tablet, Take 81 mg by mouth daily., Disp: , Rfl:    azithromycin (ZITHROMAX) 500 MG tablet, Take 1 tablet (500 mg total) by mouth as directed. Take one tablet 1 hour before any dental work including cleanings., Disp: 6 tablet, Rfl: 12   Calcium Carb-Cholecalciferol (CALCIUM 500 + D PO), Take 1 tablet by mouth 2 (two) times daily., Disp: , Rfl:    cyanocobalamin (VITAMIN B12) 1000 MCG tablet, Take 1,000 mcg by mouth daily. Sublingual, Disp: , Rfl:    diclofenac Sodium (VOLTAREN ARTHRITIS PAIN) 1 % GEL, Apply 2 g topically daily as needed (Back pain)., Disp: , Rfl:    fexofenadine (ALLEGRA) 180 MG tablet, Take 1 tablet (180 mg total) by mouth daily as needed for allergies. (Patient taking differently: Take 180 mg by mouth daily.), Disp: 90  tablet, Rfl: 3   folic acid (FOLVITE) 1 MG tablet, Take 2 mg by mouth daily., Disp: , Rfl:    furosemide (LASIX) 20 MG tablet, Take 1 tablet (20 mg total) by mouth daily as needed (swelling)., Disp: 90 tablet, Rfl: 3   guaifenesin (HUMIBID E) 400 MG TABS tablet, Take 400-1,200 mg by mouth daily as needed (Drainage)., Disp: , Rfl:    HYDROcodone bit-homatropine (HYCODAN) 5-1.5 MG/5ML syrup, Take 5 mLs by mouth at bedtime as needed for cough., Disp: , Rfl:    ibuprofen (ADVIL) 200 MG tablet, Take 200-400 mg by mouth every 6 (six) hours as needed for moderate pain (pain score 4-6)., Disp: , Rfl:    ipratropium (ATROVENT) 0.03 % nasal spray, Place 1 spray into both nostrils 3 (three) times daily as needed for rhinitis. (Patient taking differently: Place 1 spray into both nostrils daily.), Disp: 90 mL, Rfl: 3   losartan (COZAAR) 25 MG tablet, Take 0.5 tablets (12.5 mg total) by mouth daily., Disp: 45 tablet, Rfl: 3   meclizine (ANTIVERT) 12.5 MG tablet, Take 12.5 mg by mouth every 6 (six) hours as needed for dizziness., Disp: , Rfl:    Multiple Vitamin (MULTIVITAMIN WITH MINERALS) TABS tablet, Take 2 tablets by mouth daily. Gummy, Disp: , Rfl:    potassium chloride SA (KLOR-CON M) 20 MEQ tablet, Take 1 tablet (20 mEq total) by mouth as needed (when taking the lasix for swelling)., Disp: 90 tablet, Rfl: 3   predniSONE (DELTASONE)  5 MG tablet, Take 5 mg by mouth daily with breakfast., Disp: , Rfl:    Propylene Glycol, PF, (SYSTANE COMPLETE PF) 0.6 % SOLN, Place 1 drop into both eyes daily as needed (Dry eyes)., Disp: , Rfl:    rosuvastatin (CRESTOR) 5 MG tablet, Take 1 tablet (5 mg total) by mouth every morning., Disp: 90 tablet, Rfl: 3   simethicone (MYLICON) 80 MG chewable tablet, Chew 160 mg by mouth every 6 (six) hours as needed for flatulence., Disp: , Rfl:    Zinc 30 MG CAPS, Take 30 mg by mouth 2 (two) times daily., Disp: , Rfl:   Past Medical History: Past Medical History:  Diagnosis Date    Anemia    Anxiety    Essential hypertension, benign since the 1900's   Family history of adverse reaction to anesthesia    Children - N/V   History of blood transfusion    History of lung cancer    Lung adenocarcinoma s/p resection 2018   Hyperlipidemia    Other osteoporosis    Other thalassemia (HCC)    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 Lynnette Caffey and Dr. Leafy Ro   Severe aortic stenosis     Tobacco Use: Social History   Tobacco Use  Smoking Status Never  Smokeless Tobacco Never    Labs: Review Flowsheet  More data exists      Latest Ref Rng & Units 04/21/2017 11/13/2017 02/18/2018 08/06/2023 09/15/2023  Labs for ITP Cardiac and Pulmonary Rehab  Cholestrol 100 - 199 mg/dL - - 409  - -  LDL (calc) 0 - 99 mg/dL - - 68  - -  Direct LDL 0 - 99 mg/dL - 811  - - -  HDL-C >91 mg/dL - - 90  - -  Trlycerides 0 - 149 mg/dL - - 50  - -  PH, Arterial 7.35 - 7.45 7.405  - - 7.378  -  PCO2 arterial 32 - 48 mmHg 40.9  - - 40.7  -  Bicarbonate 20.0 - 28.0 mmol/L 25.1  - - 23.8  23.9  -  TCO2 22 - 32 mmol/L - - - 25  25  24    Acid-base deficit 0.0 - 2.0 mmol/L - - - 2.0  1.0  -  O2 Saturation % 95.3  - - 71  93  -    Details       Multiple values from one day are sorted in reverse-chronological order         Capillary Blood Glucose: Lab Results  Component Value Date   GLUCAP 88 04/27/2017   GLUCAP 133 (H) 04/26/2017   GLUCAP 133 (H) 04/22/2017   GLUCAP 122 (H) 04/22/2017   GLUCAP 126 (H) 04/21/2017     Exercise Target Goals: Exercise Program Goal: Individual exercise prescription set using results from initial 6 min walk test and THRR while considering  patient's activity barriers and safety.   Exercise Prescription Goal: Starting with aerobic activity 30 plus minutes a day, 3 days per week for initial exercise prescription. Provide home exercise prescription and guidelines that participant  acknowledges understanding prior to discharge.  Activity Barriers & Risk Stratification:  Activity Barriers & Cardiac Risk Stratification - 09/30/23 1419       Activity Barriers & Cardiac Risk Stratification   Activity Barriers Arthritis;Back Problems;Neck/Spine Problems;Deconditioning;Balance Concerns;History of Falls;Shortness of Breath    Cardiac Risk Stratification Moderate  6 Minute Walk:  6 Minute Walk     Row Name 09/30/23 1534 12/09/23 1314       6 Minute Walk   Phase Initial Discharge    Distance 840 feet 1480 feet    Distance Feet Change -- 640 ft    Walk Time 6 minutes 6 minutes    # of Rest Breaks 1  25 second sitting break 0    MPH 1.6 2.8    METS 1.14 2.77    RPE 12 13    Perceived Dyspnea  1 1    VO2 Peak 4.93 9.68    Symptoms No No    Resting HR 82 bpm 66 bpm    Resting BP 140/60 118/60    Resting Oxygen Saturation  97 % --    Exercise Oxygen Saturation  during 6 min walk 95 % --    Max Ex. HR 91 bpm 115 bpm    Max Ex. BP 146/60 140/60    2 Minute Post BP 136/60 132/60             Oxygen Initial Assessment:   Oxygen Re-Evaluation:   Oxygen Discharge (Final Oxygen Re-Evaluation):   Initial Exercise Prescription:  Initial Exercise Prescription - 09/30/23 1500       Date of Initial Exercise RX and Referring Provider   Date 09/30/23    Referring Provider Dina Rich MD      Oxygen   Oxygen Continuous    Maintain Oxygen Saturation 88% or higher      Treadmill   MPH 1.2    Grade 0    Minutes 15    METs 1.8      NuStep   Level 1    SPM 50    Minutes 15    METs 1.4      Prescription Details   Frequency (times per week) 3    Duration Progress to 30 minutes of continuous aerobic without signs/symptoms of physical distress      Intensity   THRR 40-80% of Max Heartrate 105-128    Ratings of Perceived Exertion 11-13    Perceived Dyspnea 0-4      Resistance Training   Training Prescription Yes    Weight 3  lbs    Reps 10-15             Perform Capillary Blood Glucose checks as needed.  Exercise Prescription Changes:   Exercise Prescription Changes     Row Name 09/30/23 1500 10/12/23 1200 10/28/23 1200 11/09/23 1300 11/18/23 1100     Response to Exercise   Blood Pressure (Admit) 140/60 122/60 110/56 120/60 102/62   Blood Pressure (Exercise) 146/60 140/60 -- -- --   Blood Pressure (Exit) 136/60 124/60 122/62 122/60 124/62   Heart Rate (Admit) 82 bpm 72 bpm 112 bpm 65 bpm 71 bpm   Heart Rate (Exercise) 91 bpm 128 bpm 142 bpm 141 bpm 105 bpm   Heart Rate (Exit) 85 bpm 98 bpm 98 bpm 123 bpm 99 bpm   Oxygen Saturation (Admit) 97 % -- -- -- --   Oxygen Saturation (Exercise) 95 % -- -- -- --   Oxygen Saturation (Exit) 95 % -- -- -- --   Rating of Perceived Exertion (Exercise) 12 12 12 12 11    Perceived Dyspnea (Exercise) 0 -- -- -- --   Symptoms R hip pain 5/10 -- -- -- --   Duration Progress to 30 minutes of  aerobic without signs/symptoms of physical distress Continue  with 30 min of aerobic exercise without signs/symptoms of physical distress. Continue with 30 min of aerobic exercise without signs/symptoms of physical distress. Continue with 30 min of aerobic exercise without signs/symptoms of physical distress. Continue with 30 min of aerobic exercise without signs/symptoms of physical distress.   Intensity THRR unchanged THRR unchanged THRR unchanged THRR unchanged THRR unchanged     Progression   Progression -- Continue to progress workloads to maintain intensity without signs/symptoms of physical distress. Continue to progress workloads to maintain intensity without signs/symptoms of physical distress. Continue to progress workloads to maintain intensity without signs/symptoms of physical distress. Continue to progress workloads to maintain intensity without signs/symptoms of physical distress.     Resistance Training   Training Prescription -- Yes Yes Yes Yes   Weight -- 3 lbs 3lbs  3 3   Reps -- 10-15 10-15 10-15 10-15     Treadmill   MPH -- 1.3 1.7 1.8 1.5   Grade -- 0.5 0.5 0.5 0.5   Minutes -- 15 15 15 15    METs -- 2.08 2.42 2.5 2.25     NuStep   Level -- 1 1 2 2    SPM -- 50 114 109 112   Minutes -- 15 15 15 15    METs -- 1.9 1.9 1.9 1.8     Home Exercise Plan   Plans to continue exercise at -- -- -- -- Home (comment)  walking at church, staff videos   Frequency -- -- -- -- Add 2 additional days to program exercise sessions.   Initial Home Exercises Provided -- -- -- -- 11/18/23     Oxygen   Maintain Oxygen Saturation -- 88% or higher 88% or higher 88% or higher 88% or higher    Row Name 11/30/23 1200             Response to Exercise   Blood Pressure (Admit) 126/60       Blood Pressure (Exit) 120/60       Heart Rate (Admit) 68 bpm       Heart Rate (Exercise) 112 bpm       Heart Rate (Exit) 94 bpm       Rating of Perceived Exertion (Exercise) 12       Duration Continue with 30 min of aerobic exercise without signs/symptoms of physical distress.       Intensity THRR unchanged         Progression   Progression Continue to progress workloads to maintain intensity without signs/symptoms of physical distress.         Resistance Training   Training Prescription Yes       Weight 3       Reps 10-15         Treadmill   MPH 1.7       Grade 1.5       Minutes 15       METs 2.62         NuStep   Level 4       SPM 100       Minutes 15       METs 1.9         Home Exercise Plan   Plans to continue exercise at Home (comment)       Frequency Add 2 additional days to program exercise sessions.         Oxygen   Maintain Oxygen Saturation 88% or higher  Exercise Comments:   Exercise Comments     Row Name 10/05/23 1610 12/11/23 1126         Exercise Comments First full day of exercise!  Patient was oriented to gym and equipment including functions, settings, policies, and procedures.  Patient's individual exercise  prescription and treatment plan were reviewed.  All starting workloads were established based on the results of the 6 minute walk test done at initial orientation visit.  The plan for exercise progression was also introduced and progression will be customized based on patient's performance and goals. Dawn Lin graduated today from  rehab with 31 sessions completed.  Details of the patient's exercise prescription and what She needs to do in order to continue the prescription and progress were discussed with patient.  Patient was given a copy of prescription and goals.  Patient verbalized understanding. Dawn Lin plans to continue to exercise by walking with friends at church.               Exercise Goals and Review:   Exercise Goals     Row Name 09/30/23 1540             Exercise Goals   Increase Physical Activity Yes       Intervention Provide advice, education, support and counseling about physical activity/exercise needs.;Develop an individualized exercise prescription for aerobic and resistive training based on initial evaluation findings, risk stratification, comorbidities and participant's personal goals.       Expected Outcomes Short Term: Attend rehab on a regular basis to increase amount of physical activity.;Long Term: Add in home exercise to make exercise part of routine and to increase amount of physical activity.;Long Term: Exercising regularly at least 3-5 days a week.       Increase Strength and Stamina Yes       Intervention Provide advice, education, support and counseling about physical activity/exercise needs.;Develop an individualized exercise prescription for aerobic and resistive training based on initial evaluation findings, risk stratification, comorbidities and participant's personal goals.       Expected Outcomes Short Term: Increase workloads from initial exercise prescription for resistance, speed, and METs.;Short Term: Perform resistance training exercises  routinely during rehab and add in resistance training at home;Long Term: Improve cardiorespiratory fitness, muscular endurance and strength as measured by increased METs and functional capacity ( )       Able to understand and use rate of perceived exertion (RPE) scale Yes       Intervention Provide education and explanation on how to use RPE scale       Expected Outcomes Short Term: Able to use RPE daily in rehab to express subjective intensity level;Long Term:  Able to use RPE to guide intensity level when exercising independently       Able to understand and use Dyspnea scale Yes       Intervention Provide education and explanation on how to use Dyspnea scale       Expected Outcomes Short Term: Able to use Dyspnea scale daily in rehab to express subjective sense of shortness of breath during exertion;Long Term: Able to use Dyspnea scale to guide intensity level when exercising independently       Knowledge and understanding of Target Heart Rate Range (THRR) Yes       Intervention Provide education and explanation of THRR including how the numbers were predicted and where they are located for reference       Expected Outcomes Short Term: Able to state/look up THRR;Long Term: Able to  use THRR to govern intensity when exercising independently;Short Term: Able to use daily as guideline for intensity in rehab       Able to check pulse independently Yes       Intervention Provide education and demonstration on how to check pulse in carotid and radial arteries.;Review the importance of being able to check your own pulse for safety during independent exercise       Expected Outcomes Short Term: Able to explain why pulse checking is important during independent exercise       Understanding of Exercise Prescription Yes       Intervention Provide education, explanation, and written materials on patient's individual exercise prescription       Expected Outcomes Short Term: Able to explain program exercise  prescription;Long Term: Able to explain home exercise prescription to exercise independently                Exercise Goals Re-Evaluation :  Exercise Goals Re-Evaluation     Row Name 10/05/23 0926 10/16/23 0758 10/27/23 1349 10/30/23 0735 11/18/23 1132     Exercise Goal Re-Evaluation   Exercise Goals Review Increase Physical Activity;Increase Strength and Stamina;Able to understand and use rate of perceived exertion (RPE) scale;Able to understand and use Dyspnea scale;Knowledge and understanding of Target Heart Rate Range (THRR);Understanding of Exercise Prescription;Able to check pulse independently Increase Physical Activity;Increase Strength and Stamina;Understanding of Exercise Prescription Increase Physical Activity;Increase Strength and Stamina;Able to understand and use Dyspnea scale;Able to understand and use rate of perceived exertion (RPE) scale;Knowledge and understanding of Target Heart Rate Range (THRR);Able to check pulse independently;Understanding of Exercise Prescription Increase Physical Activity;Increase Strength and Stamina;Understanding of Exercise Prescription Increase Physical Activity;Increase Strength and Stamina;Understanding of Exercise Prescription   Comments Reviewed RPE and dyspnea scale, THR and program prescription with pt today.  Pt voiced understanding and was given a copy of goals to take home. Dawn Lin has just started rehab and is tolerating exerice well. She is getting use to the treadmill and needs queing to take big steps instead of small quick steps. Will continue to monitor and progress as able. Dawn Lin is doing well in rehab so far.  She is already starting to feel like she is making improvements.  She has already started to add in more stretching and balance practice at home.  She is planning to get some new weights to use at home. Reviewed home exercise with pt today.  Pt plans to walk at church with daughter and friends for exercise.  Reviewed THR, pulse, RPE, sign  and symptoms, pulse oximetery and when to call 911 or MD.  Also discussed weather considerations and indoor options.  Pt voiced understanding.  Dawn Lin wants to continue to be active and get back to helping at church Dawn Lin is doing well in rehab and is tolerating exercise well. She has increased her speed on the treadmill to 1.7 with a 0.5 incline. Her heart rate did reach higher then THR onthe nustep and will need to slow down SPM. Will continue to monitor and progress as able/ Dawn Lin is doing well in rehab.  She is starting to notice that her stamina is starting to improve and now she can do full time on the treadmill.  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  Reviewed home exercise with pt today.  Pt plans to walk at church with friends for exercise.  We also talked about trying to use the staff videos as well.  Reviewed THR, pulse,  RPE, sign and symptoms, pulse oximetery and when to call 911 or MD.  Also discussed weather considerations and indoor options.  Pt voiced understanding.   Expected Outcomes Short: Use RPE daily to regulate intensity.  Long: Follow program prescription in THR. continue to attend rehab Short: Start to add in cardio at home Long: Continue to improve stamina Monitor RPE and HR when on the nustep   continue to attend rehab Short: Start to add in more walking at home and try out staff videos Long: Continue to exercise independently    Row Name 11/20/23 0815             Exercise Goal Re-Evaluation   Exercise Goals Review Increase Physical Activity;Increase Strength and Stamina;Understanding of Exercise Prescription       Comments Dawn Lin is doing well in rehab and tolerating exercise well. She is doing great on the nustep and has high SPM, we plan to move up to level 3 next visit. Will continue to monitor and progress as able.       Expected Outcomes continue to attend rehab                 Discharge Exercise Prescription (Final Exercise Prescription Changes):   Exercise Prescription Changes - 11/30/23 1200       Response to Exercise   Blood Pressure (Admit) 126/60    Blood Pressure (Exit) 120/60    Heart Rate (Admit) 68 bpm    Heart Rate (Exercise) 112 bpm    Heart Rate (Exit) 94 bpm    Rating of Perceived Exertion (Exercise) 12    Duration Continue with 30 min of aerobic exercise without signs/symptoms of physical distress.    Intensity THRR unchanged      Progression   Progression Continue to progress workloads to maintain intensity without signs/symptoms of physical distress.      Resistance Training   Training Prescription Yes    Weight 3    Reps 10-15      Treadmill   MPH 1.7    Grade 1.5    Minutes 15    METs 2.62      NuStep   Level 4    SPM 100    Minutes 15    METs 1.9      Home Exercise Plan   Plans to continue exercise at Home (comment)    Frequency Add 2 additional days to program exercise sessions.      Oxygen   Maintain Oxygen Saturation 88% or higher             Nutrition:  Target Goals: Understanding of nutrition guidelines, daily intake of sodium 1500mg , cholesterol 200mg , calories 30% from fat and 7% or less from saturated fats, daily to have 5 or more servings of fruits and vegetables.  Biometrics:  Pre Biometrics - 09/30/23 1540       Pre Biometrics   Height 4\' 11"  (1.499 m)    Weight 58.8 kg    Waist Circumference 34 inches    Hip Circumference 40 inches    Waist to Hip Ratio 0.85 %    BMI (Calculated) 26.17    Grip Strength 18.9 kg    Single Leg Stand 2 seconds              Nutrition Therapy Plan and Nutrition Goals:   Nutrition Assessments:  MEDIFICTS Score Key: >=70 Need to make dietary changes  40-70 Heart Healthy Diet <= 40 Therapeutic Level Cholesterol Diet  Flowsheet Row  CARDIAC REHAB PHASE II EXERCISE from 12/09/2023 in [facility] CARDIAC REHABILITATION  Picture Your Plate Total Score on Discharge 76      Picture Your Plate Scores: <16 Unhealthy dietary  pattern with much room for improvement. 41-50 Dietary pattern unlikely to meet recommendations for good health and room for improvement. 51-60 More healthful dietary pattern, with some room for improvement.  >60 Healthy dietary pattern, although there may be some specific behaviors that could be improved.    Nutrition Goals Re-Evaluation:  Nutrition Goals Re-Evaluation     Row Name 10/27/23 1401 11/18/23 1138           Goals   Nutrition Goal Heart Healthy Diet Short Continue to cut back on bad stuff Long: continue to work on diet      Comment Dawn Lin is working on her diet.  She has not cut out the bad stuff yet, but has started to add the good variety of fruits and vegetables back in.  She used to do weight watchers but no longer follows that.  She does use their exchange theory to help with her diet. Dawn Lin is doing well in rehab.  She says her diet is not the best.  She feels hunger all the time.  She does not feel that she is getting enough protein.  She is eating beans and lots of fiber with greens and whole grain.  We talked about supplmenting with protein and getting snacks with protein.  She also has low hemoglobin on occassion.  She takes folic acid and predisone to help.  We talked about predisone can make her hungry too. She does not eat a lot just frequently.  She also eats out frequently and was encouraged to try to limit that and watch sodium in food.      Expected Outcome Short Continue to cut back on bad stuff Long: continue to work on diet Short: Try to supplement with more protein and drink before eating when hungry Long: Continue to watch sodium when eating out               Nutrition Goals Discharge (Final Nutrition Goals Re-Evaluation):  Nutrition Goals Re-Evaluation - 11/18/23 1138       Goals   Nutrition Goal Short Continue to cut back on bad stuff Long: continue to work on diet    Comment Dawn Lin is doing well in rehab.  She says her diet is not the best.  She feels hunger  all the time.  She does not feel that she is getting enough protein.  She is eating beans and lots of fiber with greens and whole grain.  We talked about supplmenting with protein and getting snacks with protein.  She also has low hemoglobin on occassion.  She takes folic acid and predisone to help.  We talked about predisone can make her hungry too. She does not eat a lot just frequently.  She also eats out frequently and was encouraged to try to limit that and watch sodium in food.    Expected Outcome Short: Try to supplement with more protein and drink before eating when hungry Long: Continue to watch sodium when eating out             Psychosocial: Target Goals: Acknowledge presence or absence of significant depression and/or stress, maximize coping skills, provide positive support system. Participant is able to verbalize types and ability to use techniques and skills needed for reducing stress and depression.  Initial Review & Psychosocial Screening:  Initial Psych Review & Screening - 09/30/23 1520       Initial Review   Current issues with None Identified      Family Dynamics   Good Support System? Yes    Comments Sisters, 2 sons and daughter-in-laws and chruch family.      Barriers   Psychosocial barriers to participate in program The patient should benefit from training in stress management and relaxation.;There are no identifiable barriers or psychosocial needs.      Screening Interventions   Interventions Encouraged to exercise;To provide support and resources with identified psychosocial needs;Provide feedback about the scores to participant    Expected Outcomes Long Term Goal: Stressors or current issues are controlled or eliminated.;Short Term goal: Utilizing psychosocial counselor, staff and physician to assist with identification of specific Stressors or current issues interfering with healing process. Setting desired goal for each stressor or current issue  identified.;Short Term goal: Identification and review with participant of any Quality of Life or Depression concerns found by scoring the questionnaire.;Long Term goal: The participant improves quality of Life and PHQ9 Scores as seen by post scores and/or verbalization of changes             Quality of Life Scores:  Quality of Life - 12/09/23 1333       Quality of Life Scores   Health/Function Pre 19.32 %    Health/Function Post 22.86 %    Health/Function % Change 18.32 %    Socioeconomic Pre 24.75 %    Socioeconomic Post 24 %    Socioeconomic % Change  -3.03 %    Psych/Spiritual Pre 21.07 %    Psych/Spiritual Post 21.71 %    Psych/Spiritual % Change 3.04 %    Family Pre 21 %    Family Post 22.5 %    Family % Change 7.14 %    GLOBAL Pre 20.98 %    GLOBAL Post 22.81 %    GLOBAL % Change 8.72 %            Scores of 19 and below usually indicate a poorer quality of life in these areas.  A difference of  2-3 points is a clinically meaningful difference.  A difference of 2-3 points in the total score of the Quality of Life Index has been associated with significant improvement in overall quality of life, self-image, physical symptoms, and general health in studies assessing change in quality of life.  PHQ-9: Review Flowsheet  More data exists      12/09/2023 10/27/2023 09/30/2023 11/28/2011 10/31/2011  Depression screen PHQ 2/9  Decreased Interest 0 0 1 0 0  Down, Depressed, Hopeless 0 0 0 0 0  PHQ - 2 Score 0 0 1 0 0  Altered sleeping 1 0 1 - -  Tired, decreased energy 2 2 2  - -  Change in appetite 2 0 2 - -  Feeling bad or failure about yourself  0 0 0 - -  Trouble concentrating 0 1 1 - -  Moving slowly or fidgety/restless 0 0 0 - -  Suicidal thoughts 0 0 0 - -  PHQ-9 Score 5 3 7  - -  Difficult doing work/chores Not difficult at all Somewhat difficult Somewhat difficult - -   Interpretation of Total Score  Total Score Depression Severity:  1-4 = Minimal depression,  5-9 = Mild depression, 10-14 = Moderate depression, 15-19 = Moderately severe depression, 20-27 = Severe depression   Psychosocial Evaluation and Intervention:  Psychosocial Evaluation - 09/30/23  [id number]       Psychosocial Evaluation & Interventions   Interventions Stress management education;Relaxation education;Encouraged to exercise with the program and follow exercise prescription    Comments Patient was referred to CR with [facility] 09/15/23. Her PHQ-9 score was 7 due to lack of energy, overeating and trouble falling asleep. She denies any depression or anxiety. She says her 44 year-old great grandson is undergoing some testing and this is causing her some stress but otherwise no major stressors. She has a great support system with her 2 sons and daughter-in-laws and 2 sisters and she is very active in her church. She says she knows several people that have participated in CR and she is very motivated to do the program. She does have a $35 co-payment but does not think this will be a problem. Her goals for the program are to be able to work in her garden again; do yardwork; strengthen her core; and get stronger overall; and be able to do more with her great grand-children. She has no barriers identified to complete the program.    Expected Outcomes Short Term: start the program and attend consistently. Long term: meet her personal goals.    Continue Psychosocial Services  Follow up required by staff             Psychosocial Re-Evaluation:  Psychosocial Re-Evaluation     Row Name 10/27/23 1354 11/18/23 1137           Psychosocial Re-Evaluation   Current issues with History of Depression;Current Stress Concerns History of Depression;Current Stress Concerns      Comments Dawn Lin is doing well in rehab.  She is eager to get moving again and to be able to get back to helping out at church.  She is planning to start to walk with her daughter and friends at church to help.  Reassess PHQ today and it has  improved from a 7 to a 3.  She says that she is not depressed, just frustrated at times that she can't go and do.  She has a sister that is bed ridden and her nephew cares for her sister.  She feels blessed with what she does have. She is sleeping better overall, just has frequent trips to bathroom with her bladder. Dawn Lin has three sons.  Two are great and she has one that has made poor lifestyle decisions that has left him with bilateral leg amputation and now having seziures.  She does not feel that she cannot care for him as she can't watch him destroy himself.  His dad would not take him back.  He is currently at the [facility] but does not want to give up his income to pay for habits.  She made the hard choice to say yes to herself.  Her oldest son looks after his brother and sets up his arrangements. Dawn Lin is doing well in rehab.  She is adjusting to stressors.  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  She has a great support system to that hold her accountable.  She just wishes she was more motivated.  She is looking forward to the nicer weather around the corner. She told her sister that she misses herself.  She wants to work on her exercise and diet.      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  Long; Conitnue to move more for mental boost      Interventions Encouraged to attend Cardiac Rehabilitation for the exercise;Stress management education --      Continue Psychosocial Services  Follow up required by staff --               Psychosocial Discharge (Final Psychosocial Re-Evaluation):  Psychosocial Re-Evaluation - 11/18/23 1137       Psychosocial Re-Evaluation   Current issues with History of Depression;Current Stress Concerns    Comments Dawn Lin is doing well in rehab.  She is adjusting to  stressors.  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  She has a great support system to that hold her accountable.  She just wishes she was more motivated.  She is looking forward to the nicer weather around the corner. She told her sister that she misses herself.  She wants to work on her exercise and diet.    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  Long; Conitnue to move more for mental boost             Vocational Rehabilitation: Provide vocational rehab assistance to qualifying candidates.   Vocational Rehab Evaluation & Intervention:  Vocational Rehab - 09/30/23 1519       Initial Vocational Rehab Evaluation & Intervention   Assessment shows need for Vocational Rehabilitation No      Vocational Rehab Re-Evaulation   Comments Retired             Education: Education Goals: Education classes will be provided on a weekly basis, covering required topics. Participant will state understanding/return demonstration of topics presented.  Learning Barriers/Preferences:  Learning Barriers/Preferences - 09/30/23 1430       Learning Barriers/Preferences   Learning Barriers None    Learning Preferences Written Material;Audio;Skilled Demonstration             Education Topics: Hypertension, Hypertension Reduction -Define heart disease and high blood pressure. Discus how high blood pressure affects the body and ways to reduce high blood pressure. Flowsheet Row CARDIAC REHAB PHASE II EXERCISE from 12/09/2023 in [place] CARDIAC REHABILITATION  Date 10/28/23  Educator jh  Instruction Review Code 1- Verbalizes Understanding       Exercise and Your Heart -Discuss why it is important to exercise, the FITT principles of exercise, normal and abnormal responses to exercise, and how to exercise safely. Flowsheet Row CARDIAC REHAB PHASE II EXERCISE  from 12/09/2023 in [place] CARDIAC REHABILITATION  Date 11/25/23  Educator [facility]  Instruction Review Code 1- Verbalizes Understanding       Angina -Discuss definition of angina, causes of angina, treatment of angina, and how to decrease risk of having angina.   Cardiac Medications -Review what the following cardiac medications are used for, how they affect the body, and side effects that may occur when taking the medications.  Medications include Aspirin, Beta blockers, calcium channel blockers, ACE Inhibitors, angiotensin receptor blockers, diuretics, digoxin, and antihyperlipidemics.   Congestive Heart Failure -Discuss the definition of CHF, how to live with CHF, the signs and symptoms of CHF, and how keep track of weight and sodium intake.   Heart Disease and Intimacy -Discus the effect sexual activity has on the heart, how changes occur during intimacy as we age, and  safety during sexual activity.   Smoking Cessation / COPD -Discuss different methods to quit smoking, the health benefits of quitting smoking, and the definition of COPD.   Nutrition I: Fats -Discuss the types of cholesterol, what cholesterol does to the heart, and how cholesterol levels can be controlled.   Nutrition II: Labels -Discuss the different components of food labels and how to read food label   Heart Parts/Heart Disease and PAD -Discuss the anatomy of the heart, the pathway of blood circulation through the heart, and these are affected by heart disease.   Stress I: Signs and Symptoms -Discuss the causes of stress, how stress may lead to anxiety and depression, and ways to limit stress. Flowsheet Row CARDIAC REHAB PHASE II EXERCISE from 12/09/2023 in [place] CARDIAC REHABILITATION  Date 10/21/23  Educator hb  Instruction Review Code 1- Verbalizes Understanding       Stress II: Relaxation -Discuss different types of relaxation techniques to limit stress. Flowsheet Row CARDIAC REHAB PHASE  II EXERCISE from 12/09/2023 in [place] CARDIAC REHABILITATION  Date 12/09/23  Educator jh  Instruction Review Code 1- Verbalizes Understanding       Warning Signs of Stroke / TIA -Discuss definition of a stroke, what the signs and symptoms are of a stroke, and how to identify when someone is having stroke.   Knowledge Questionnaire Score:  Knowledge Questionnaire Score - 12/09/23 1333       Knowledge Questionnaire Score   Post Score 22/24             Core Components/Risk Factors/Patient Goals at Admission:  Personal Goals and Risk Factors at Admission - 09/30/23 1519       Core Components/Risk Factors/Patient Goals on Admission    Weight Management Weight Maintenance    Improve shortness of breath with ADL's Yes    Intervention Provide education, individualized exercise plan and daily activity instruction to help decrease symptoms of SOB with activities of daily living.    Expected Outcomes Short Term: Improve cardiorespiratory fitness to achieve a reduction of symptoms when performing ADLs;Long Term: Be able to perform more ADLs without symptoms or delay the onset of symptoms    Heart Failure Yes    Intervention Provide a combined exercise and nutrition program that is supplemented with education, support and counseling about heart failure. Directed toward relieving symptoms such as shortness of breath, decreased exercise tolerance, and extremity edema.    Expected Outcomes Improve functional capacity of life;Short term: Attendance in program 2-3 days a week with increased exercise capacity. Reported lower sodium intake. Reported increased fruit and vegetable intake. Reports medication compliance.;Short term: Daily weights obtained and reported for increase. Utilizing diuretic protocols set by physician.;Long term: Adoption of self-care skills and reduction of barriers for early signs and symptoms recognition and intervention leading to self-care maintenance.    Hypertension  Yes    Intervention Provide education on lifestyle modifcations including regular physical activity/exercise, weight management, moderate sodium restriction and increased consumption of fresh fruit, vegetables, and low fat dairy, alcohol moderation, and smoking cessation.;Monitor prescription use compliance.    Expected Outcomes Short Term: Continued assessment and intervention until BP is < 140/60mm HG in hypertensive participants. < 130/16mm HG in hypertensive participants with diabetes, heart failure or chronic kidney disease.;Long Term: Maintenance of blood pressure at goal levels.    Lipids Yes    Intervention Provide education and support for participant on nutrition & aerobic/resistive exercise along with prescribed medications to achieve LDL 70mg , HDL >40mg .  Expected Outcomes Short Term: Participant states understanding of desired cholesterol values and is compliant with medications prescribed. Participant is following exercise prescription and nutrition guidelines.;Long Term: Cholesterol controlled with medications as prescribed, with individualized exercise RX and with personalized nutrition plan. Value goals: LDL < 70mg , HDL > 40 mg.             Core Components/Risk Factors/Patient Goals Review:   Goals and Risk Factor Review     Row Name 10/27/23 1403 11/18/23 1141           Core Components/Risk Factors/Patient Goals Review   Personal Goals Review Improve shortness of breath with ADL's;Weight Management/Obesity;Hypertension;Heart Failure;Lipids Improve shortness of breath with ADL's;Weight Management/Obesity;Hypertension;Heart Failure;Lipids      Review Dawn Lin is doing well in rehab.  She is working on maintaining her weight.  She has had any heart failure symptoms.  Her pressures are doing well.  She was doing well with checking them at home, but has gotten away from it with the weather.  She is also starting to breath better and feel better overall. Dawn Lin is doing well in rehab.   She has been maintaining her weight, but she wants to lose.  She is hungry a lot and gives into the cookies.  She does eat out but tries to make good choices.  We talked about watching her sodium intake.  She does not notices swelling, but does feel like she retains fluid on occassion and will take her lasix and potassium to help.  Her pressures are doing well and is now on losartan as well.  She feels likes she is well managed.  Her breathing feels like it is getting better.  She notices it most when she is bending.      Expected Outcomes Short: Continue to montior heart failure Long: Continue to montior risk factors. Short: Conitnue to watch weight Long; Continue to manage heart failure.               Core Components/Risk Factors/Patient Goals at Discharge (Final Review):   Goals and Risk Factor Review - 11/18/23 1141       Core Components/Risk Factors/Patient Goals Review   Personal Goals Review Improve shortness of breath with ADL's;Weight Management/Obesity;Hypertension;Heart Failure;Lipids    Review Dawn Lin is doing well in rehab.  She has been maintaining her weight, but she wants to lose.  She is hungry a lot and gives into the cookies.  She does eat out but tries to make good choices.  We talked about watching her sodium intake.  She does not notices swelling, but does feel like she retains fluid on occassion and will take her lasix and potassium to help.  Her pressures are doing well and is now on losartan as well.  She feels likes she is well managed.  Her breathing feels like it is getting better.  She notices it most when she is bending.    Expected Outcomes Short: Conitnue to watch weight Long; Continue to manage heart failure.             ITP Comments:  ITP Comments     Row Name 10/05/23 [phone] 10/13/23 0805 11/11/23 0959 12/09/23 0829 12/11/23 1126   ITP Comments First full day of exercise!  Patient was oriented to gym and equipment including functions, settings, policies, and  procedures.  Patient's individual exercise prescription and treatment plan were reviewed.  All starting workloads were established based on the results of the 6 minute walk test done at initial  orientation visit.  The plan for exercise progression was also introduced and progression will be customized based on patient's performance and goals. 30 day review completed. ITP sent to Dr. Dina Rich, Medical Director of Cardiac Rehab. Continue with ITP unless changes are made by physician.  Still new to program. 30 day review completed. ITP sent to Dr. Dina Rich, Medical Director of Cardiac Rehab. Continue with ITP unless changes are made by physician. 30 day review completed. ITP sent to Dr. Dina Rich, Medical Director of Cardiac Rehab. Continue with ITP unless changes are made by physician. Tasheka graduated today from  rehab with 31 sessions completed.  Details of the patient's exercise prescription and what She needs to do in order to continue the prescription and progress were discussed with patient.  Patient was given a copy of prescription and goals.  Patient verbalized understanding. Erna plans to continue to exercise by walking with friends at church.            Comments: Discharge ITP.

## 2023-12-14 ENCOUNTER — Encounter (HOSPITAL_COMMUNITY): Payer: Medicare HMO

## 2023-12-16 ENCOUNTER — Encounter (HOSPITAL_COMMUNITY): Payer: Medicare HMO

## 2023-12-17 ENCOUNTER — Inpatient Hospital Stay: Payer: Medicare HMO | Attending: Hematology | Admitting: Oncology

## 2023-12-17 DIAGNOSIS — Z85118 Personal history of other malignant neoplasm of bronchus and lung: Secondary | ICD-10-CM | POA: Insufficient documentation

## 2023-12-17 DIAGNOSIS — R7989 Other specified abnormal findings of blood chemistry: Secondary | ICD-10-CM

## 2023-12-17 DIAGNOSIS — Z803 Family history of malignant neoplasm of breast: Secondary | ICD-10-CM | POA: Insufficient documentation

## 2023-12-17 DIAGNOSIS — Z7952 Long term (current) use of systemic steroids: Secondary | ICD-10-CM | POA: Diagnosis not present

## 2023-12-17 DIAGNOSIS — Z79899 Other long term (current) drug therapy: Secondary | ICD-10-CM | POA: Diagnosis not present

## 2023-12-17 DIAGNOSIS — Z7982 Long term (current) use of aspirin: Secondary | ICD-10-CM | POA: Insufficient documentation

## 2023-12-17 DIAGNOSIS — D590 Drug-induced autoimmune hemolytic anemia: Secondary | ICD-10-CM

## 2023-12-17 DIAGNOSIS — D591 Autoimmune hemolytic anemia, unspecified: Secondary | ICD-10-CM | POA: Insufficient documentation

## 2023-12-17 LAB — DIRECT ANTIGLOBULIN TEST (NOT AT ARMC)
DAT, IgG: POSITIVE
DAT, complement: POSITIVE

## 2023-12-17 LAB — FERRITIN: Ferritin: 382 ng/mL — ABNORMAL HIGH (ref 11–307)

## 2023-12-17 LAB — RETICULOCYTES
Immature Retic Fract: 20.7 % — ABNORMAL HIGH (ref 2.3–15.9)
RBC.: 5.63 MIL/uL — ABNORMAL HIGH (ref 3.87–5.11)
Retic Count, Absolute: 127.8 10*3/uL (ref 19.0–186.0)
Retic Ct Pct: 2.3 % (ref 0.4–3.1)

## 2023-12-17 LAB — CBC
HCT: 36 % (ref 36.0–46.0)
Hemoglobin: 11.2 g/dL — ABNORMAL LOW (ref 12.0–15.0)
MCH: 20 pg — ABNORMAL LOW (ref 26.0–34.0)
MCHC: 31.1 g/dL (ref 30.0–36.0)
MCV: 64.4 fL — ABNORMAL LOW (ref 80.0–100.0)
Platelets: 216 10*3/uL (ref 150–400)
RBC: 5.59 MIL/uL — ABNORMAL HIGH (ref 3.87–5.11)
RDW: 17.3 % — ABNORMAL HIGH (ref 11.5–15.5)
WBC: 9 10*3/uL (ref 4.0–10.5)
nRBC: 0.3 % — ABNORMAL HIGH (ref 0.0–0.2)

## 2023-12-17 LAB — HEPATIC FUNCTION PANEL
ALT: 16 U/L (ref 0–44)
AST: 27 U/L (ref 15–41)
Albumin: 4.3 g/dL (ref 3.5–5.0)
Alkaline Phosphatase: 54 U/L (ref 38–126)
Bilirubin, Direct: 0.2 mg/dL (ref 0.0–0.2)
Indirect Bilirubin: 1.4 mg/dL — ABNORMAL HIGH (ref 0.3–0.9)
Total Bilirubin: 1.6 mg/dL — ABNORMAL HIGH (ref 0.0–1.2)
Total Protein: 6.6 g/dL (ref 6.5–8.1)

## 2023-12-17 LAB — IRON AND TIBC
Iron: 79 ug/dL (ref 28–170)
Saturation Ratios: 33 % — ABNORMAL HIGH (ref 10.4–31.8)
TIBC: 243 ug/dL — ABNORMAL LOW (ref 250–450)
UIBC: 164 ug/dL

## 2023-12-17 LAB — LACTATE DEHYDROGENASE: LDH: 160 U/L (ref 98–192)

## 2023-12-17 NOTE — Progress Notes (Signed)
 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 Reported to Dr. Lorelle Formosa.

## 2023-12-18 ENCOUNTER — Encounter (HOSPITAL_COMMUNITY): Payer: Medicare HMO

## 2023-12-21 ENCOUNTER — Encounter (HOSPITAL_COMMUNITY): Payer: Medicare HMO

## 2023-12-23 ENCOUNTER — Encounter (HOSPITAL_COMMUNITY): Payer: Medicare HMO

## 2023-12-24 ENCOUNTER — Inpatient Hospital Stay (HOSPITAL_BASED_OUTPATIENT_CLINIC_OR_DEPARTMENT_OTHER): Payer: Medicare HMO | Admitting: Hematology

## 2023-12-24 VITALS — BP 133/84 | HR 77 | Temp 98.1°F | Resp 16 | Wt 127.6 lb

## 2023-12-24 DIAGNOSIS — D590 Drug-induced autoimmune hemolytic anemia: Secondary | ICD-10-CM | POA: Diagnosis not present

## 2023-12-24 DIAGNOSIS — Z7952 Long term (current) use of systemic steroids: Secondary | ICD-10-CM | POA: Diagnosis not present

## 2023-12-24 DIAGNOSIS — Z803 Family history of malignant neoplasm of breast: Secondary | ICD-10-CM | POA: Diagnosis not present

## 2023-12-24 DIAGNOSIS — Z7982 Long term (current) use of aspirin: Secondary | ICD-10-CM | POA: Diagnosis not present

## 2023-12-24 DIAGNOSIS — Z79899 Other long term (current) drug therapy: Secondary | ICD-10-CM | POA: Diagnosis not present

## 2023-12-24 DIAGNOSIS — R7989 Other specified abnormal findings of blood chemistry: Secondary | ICD-10-CM | POA: Diagnosis not present

## 2023-12-24 DIAGNOSIS — Z85118 Personal history of other malignant neoplasm of bronchus and lung: Secondary | ICD-10-CM | POA: Diagnosis not present

## 2023-12-24 DIAGNOSIS — D591 Autoimmune hemolytic anemia, unspecified: Secondary | ICD-10-CM | POA: Diagnosis not present

## 2023-12-24 NOTE — Patient Instructions (Addendum)

## 2023-12-24 NOTE — Progress Notes (Signed)
 [facility] 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 - Status post rituximab weekly x4 completed on 11/28/2021.  She had relapse of hemolysis when prednisone was discontinued.  Currently on prednisone 5 mg daily treated by Dr Youlanda Mighty at [place].  She is switching care due to proximity. - She also has hereditary beta thalassemia trait (11/13/2020 hemoglobin electrophoresis: HbA-91.8%, HbA2-4.9%, HbF-3.3%) and reports Dawn normal hemoglobin is between 10 and 11.    Social/family history: - She lives by herself at home.  She retired from office job.  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  Dawn Lin is [place] who is our patient.  She also worked on at tobacco farm.  She has exposure to MH 30 and other fertilizers.  She was never smoker. - Half sister has breast cancer.  Does not know Dawn paternal side of the family.  3.  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 - Reviewed CT chest without contrast from 06/10/2022 which showed no evidence of recurrence or metastatic disease. - No need of further scans.  Plan: Autoimmune hemolytic anemia: - She denies any B symptoms or infections in the last 6 months. - She had TAVR procedure for aortic stenosis on 09/15/2023 when Dawn hemoglobin dropped down to 9.3. - Labs on 12/17/2023: Hemoglobin improved 11.2.  MCV 64.  LDH is normal at 160.  Reticulocyte count is normal at 2.3.  DAT was positive for IgG and complement.  No antibodies were  detected on eluate.  LFTs show elevated total bilirubin 1.6, predominantly indirect bilirubin from hemolysis. - Continue prednisone 5 mg daily and folic acid 1 mg daily. - RTC 6 months for follow-up with repeat labs.  2.  Elevated ferritin: - Ferritin is 382, down from 394.  Percent saturation is 33 and stable.    Orders Placed This Encounter  Procedures   Hepatic function panel    Standing Status:   Future    Expected Date:   06/20/2024    Expiration Date:   12/23/2024   CBC with Differential    Standing Status:   Future    Expected Date:   06/20/2024    Expiration Date:   12/23/2024   Lactate dehydrogenase    Standing Status:   Future    Expected Date:   06/20/2024    Expiration Date:   12/23/2024   Reticulocytes    Standing Status:   Future    Expected Date:   06/20/2024    Expiration Date:   12/23/2024   Direct antiglobulin test    Standing Status:   Future    Expected Date:   06/20/2024    Expiration Date:   12/23/2024      Mikeal Hawthorne R Teague,acting as a scribe for Doreatha Massed, MD.,have documented all relevant documentation on the behalf of Doreatha Massed, MD,as directed by  Doreatha Massed, MD while in the presence of Doreatha Massed, MD.  I, Doreatha Massed MD, have reviewed the above documentation for accuracy and completeness, and I agree with the above.  Doreatha Massed, MD   3/13/202511:59 AM  CHIEF COMPLAINT:   Diagnosis: autoimmune hemolytic anemia    Cancer Staging  Malignant neoplasm of right upper lobe of lung (HCC) Staging form: Lung, AJCC 8th Edition - Pathologic stage from 04/21/2017: Stage IA2 (pT1b, pN0, cM0) - Signed by Delight Ovens, MD on 04/22/2017    Prior Therapy: none  Current Therapy:  surveillance   INTERVAL HISTORY:   Dawn Lin is a 83 y.o. female presenting to clinic today for follow up of autoimmune hemolytic anemia. She was last seen by me on 06/25/23.  Since Dawn last visit, she underwent right and left  heart catheter and coronary angiography on 08/06/23. Dawn Lin was then found to have severe aortic stenosis and had a planned transcatheter aortic valve replacement on 09/15/23. She has completed outpatient cardiac rehab.   Today, she states that she is doing well overall. Dawn appetite level is at 100%. Dawn energy level is at 75%. Zykera is taking Prednisone and folic acid as prescribed.   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 Lynnette Caffey and Dr. Leafy Ro   Severe aortic stenosis     Surgical History: Past Surgical History:  Procedure Laterality Date   BIOPSY  12/28/2020   Procedure: BIOPSY;  Surgeon: Dolores Frame, MD;  [facility];  Service: Gastroenterology;;   COLONOSCOPY     COLONOSCOPY WITH PROPOFOL N/A 12/28/2020   Procedure: COLONOSCOPY WITH PROPOFOL;  Surgeon: Dolores Frame, MD;  [facility];  Service: Gastroenterology;  Laterality: N/A;  AM   ESOPHAGOGASTRODUODENOSCOPY (EGD) WITH PROPOFOL N/A 12/28/2020   Procedure: ESOPHAGOGASTRODUODENOSCOPY (EGD) WITH PROPOFOL;  Surgeon: Dolores Frame, MD;  [facility];  Service: Gastroenterology;  Laterality: N/A;   INGUINAL HERNIA REPAIR Left    INTRAOPERATIVE TRANSTHORACIC ECHOCARDIOGRAM N/A 09/15/2023   Procedure: INTRAOPERATIVE TRANSTHORACIC ECHOCARDIOGRAM;  Surgeon: Orbie Pyo, MD;  [facility];  Service: Cardiovascular;  Laterality: N/A;   LUMBAR DISC SURGERY  1990's   for  severe pain and sciatica   PARTIAL HYSTERECTOMY  1970's   for bleeding and prolapse   RIGHT/LEFT HEART CATH AND CORONARY ANGIOGRAPHY N/A 08/06/2023   Procedure: RIGHT/LEFT HEART CATH AND CORONARY ANGIOGRAPHY;  Surgeon: Kathleene Hazel, MD;  [facility];  Service: Cardiovascular;  Laterality: N/A;   VIDEO ASSISTED THORACOSCOPY (VATS)/ LOBECTOMY Right 04/20/2017   Procedure: VIDEO ASSISTED THORACOSCOPY (VATS)/RIGHT UPPER LOBECTOMY;  Surgeon: Delight Ovens, MD;  Location: [facility] OR;  Service: Thoracic;  Laterality: Right;   VIDEO BRONCHOSCOPY N/A 04/20/2017   Procedure: VIDEO BRONCHOSCOPY;  Surgeon: Delight Ovens, MD;  Location: [facility] OR;  Service: Thoracic;  Laterality: N/A;    Social History: Social History   Socioeconomic History   Marital status: Widowed    Spouse name: Not on file   Number of children: Not on file   Years of education: Not on file   Highest  education level: Not on file  Occupational History   Occupation: retired  Tobacco Use   Smoking status: Never   Smokeless tobacco: Never  Vaping Use   Vaping status: Never Used  Substance and Sexual Activity   Alcohol use: No   Drug use: No   Sexual activity: Never  Other Topics Concern   Not on file  Social History Narrative   Mrs. Dearcos is married. Dawn husband has been chronically ill for 6 years (2011). He has metastatic prostate cancer and multiple myeloma. 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 She has a lot of family support.    Social Drivers of Corporate investment banker Strain: Low Risk  (11/27/2021)   Received from [facility], [facility]   Overall Financial Resource Strain (CARDIA)    Difficulty of Paying Living Expenses: Not hard at all  Food Insecurity: No Food Insecurity (09/15/2023)   Hunger Vital Sign    Worried About Running Out of Food in the Last Year: Never true    Ran Out of Food in the Last Year: Never true  Transportation Needs: Patient  Declined (09/15/2023)   PRAPARE - Administrator, Civil Service (Medical): Patient declined    Lack of Transportation (Non-Medical): Patient declined  Physical Activity: Inactive (11/27/2021)   Received from [facility], [facility]   Exercise Vital Sign    Days of Exercise per Week: 0 days    Minutes of Exercise per Session: 0 min  Stress: No Stress Concern Present (11/27/2021)   Received from [facility], [facility] - Occupational Stress Questionnaire    Feeling of Stress : Not at all  Social Connections: Moderately Isolated (11/27/2021)   Received from [facility], [facility]   Social Connection and Isolation Panel [NHANES]    Frequency of Communication with Friends and Family: More than three times a week    Frequency of Social Gatherings with Friends and Family: More than three times a week    Attends Religious Services: More than 4 times per year    Active Member of [employer] or Organizations: No    Attends Banker Meetings: Never    Marital Status: Widowed  Intimate Partner Violence: Patient Declined (09/15/2023)   Humiliation, Afraid, Rape, and Kick questionnaire    Fear of Current or Ex-Partner: Patient declined    Emotionally Abused: Patient declined    Physically Abused: Patient declined    Sexually Abused: Patient declined    Family History: Family History  Problem Relation Age of Onset   Hypertension Mother    Asthma Sister    Heart attack Sister 77    Current Medications:  Current Outpatient Medications:    acetaminophen (TYLENOL) 500 MG tablet, Take 1 tablet (500 mg total) by mouth every 6 (six) hours as needed. (Patient taking differently: Take 500-1,000 mg by mouth every 6 (six) hours as needed for moderate pain (pain score 4-6).), Disp: 30 tablet, Rfl: 0   Alpha-D-Galactosidase (BEANO PO), Take 2 capsules by mouth daily as needed (bloating)., Disp: , Rfl:    amLODipine  (NORVASC) 10 MG tablet, Take 1 tablet (10 mg total) by mouth daily., Disp: 90 tablet, Rfl: 3   Ascorbic Acid (VITAMIN C PO), Take 564 mg by mouth daily. 282 mg each, Disp: , Rfl:    aspirin EC 81 MG tablet, Take 81 mg by mouth  daily., Disp: , Rfl:    azithromycin (ZITHROMAX) 500 MG tablet, Take 1 tablet (500 mg total) by mouth as directed. Take one tablet 1 hour before any dental work including cleanings., Disp: 6 tablet, Rfl: 12   Calcium Carb-Cholecalciferol (CALCIUM 500 + D PO), Take 1 tablet by mouth 2 (two) times daily., Disp: , Rfl:    cyanocobalamin (VITAMIN B12) 1000 MCG tablet, Take 1,000 mcg by mouth daily. Sublingual, Disp: , Rfl:    diclofenac Sodium (VOLTAREN ARTHRITIS PAIN) 1 % GEL, Apply 2 g topically daily as needed (Back pain)., Disp: , Rfl:    fexofenadine (ALLEGRA) 180 MG tablet, Take 1 tablet (180 mg total) by mouth daily as needed for allergies. (Patient taking differently: Take 180 mg by mouth daily.), Disp: 90 tablet, Rfl: 3   folic acid (FOLVITE) 1 MG tablet, Take 2 mg by mouth daily., Disp: , Rfl:    furosemide (LASIX) 20 MG tablet, Take 1 tablet (20 mg total) by mouth daily as needed (swelling)., Disp: 90 tablet, Rfl: 3   guaifenesin (HUMIBID E) 400 MG TABS tablet, Take 400-1,200 mg by mouth daily as needed (Drainage)., Disp: , Rfl:    HYDROcodone bit-homatropine (HYCODAN) 5-1.5 MG/5ML syrup, Take 5 mLs by mouth at bedtime as needed for cough., Disp: , Rfl:    ibuprofen (ADVIL) 200 MG tablet, Take 200-400 mg by mouth every 6 (six) hours as needed for moderate pain (pain score 4-6)., Disp: , Rfl:    ipratropium (ATROVENT) 0.03 % nasal spray, Place 1 spray into both nostrils 3 (three) times daily as needed for rhinitis. (Patient taking differently: Place 1 spray into both nostrils daily.), Disp: 90 mL, Rfl: 3   losartan (COZAAR) 25 MG tablet, Take 0.5 tablets (12.5 mg total) by mouth daily., Disp: 45 tablet, Rfl: 3   meclizine (ANTIVERT) 12.5 MG tablet, Take 12.5 mg by mouth  every 6 (six) hours as needed for dizziness., Disp: , Rfl:    Multiple Vitamin (MULTIVITAMIN WITH MINERALS) TABS tablet, Take 2 tablets by mouth daily. Gummy, Disp: , Rfl:    potassium chloride SA (KLOR-CON M) 20 MEQ tablet, Take 1 tablet (20 mEq total) by mouth as needed (when taking the lasix for swelling)., Disp: 90 tablet, Rfl: 3   predniSONE (DELTASONE) 5 MG tablet, Take 5 mg by mouth daily with breakfast., Disp: , Rfl:    Propylene Glycol, PF, (SYSTANE COMPLETE PF) 0.6 % SOLN, Place 1 drop into both eyes daily as needed (Dry eyes)., Disp: , Rfl:    rosuvastatin (CRESTOR) 5 MG tablet, Take 1 tablet (5 mg total) by mouth every morning., Disp: 90 tablet, Rfl: 3   simethicone (MYLICON) 80 MG chewable tablet, Chew 160 mg by mouth every 6 (six) hours as needed for flatulence., Disp: , Rfl:    Zinc 30 MG CAPS, Take 30 mg by mouth 2 (two) times daily., Disp: , Rfl:    Allergies: Allergies  Allergen Reactions   Amoxicillin Diarrhea   Lisinopril Cough    REVIEW OF SYSTEMS:   Review of Systems  Constitutional:  Negative for chills, fatigue and fever.  HENT:   Negative for lump/mass, mouth sores, nosebleeds, sore throat and trouble swallowing.   Eyes:  Negative for eye problems.  Respiratory:  Positive for cough and shortness of breath.   Cardiovascular:  Negative for chest pain, leg swelling and palpitations.  Gastrointestinal:  Negative for abdominal pain, constipation, diarrhea, nausea and vomiting.  Genitourinary:  Negative for bladder incontinence, difficulty urinating, dysuria, frequency, hematuria  and nocturia.   Musculoskeletal:  Positive for back pain (low, 2/10 severity). Negative for arthralgias, flank pain, myalgias and neck pain.       +right hip sciatic pain, 2/10 severity  Skin:  Negative for itching and rash.  Neurological:  Positive for dizziness. Negative for headaches and numbness.  Hematological:  Does not bruise/bleed easily.  Psychiatric/Behavioral:  Negative for  depression, sleep disturbance and suicidal ideas. The patient is not nervous/anxious.   All other systems reviewed and are negative.    VITALS:   Blood pressure 133/84, pulse 77, temperature 98.1 F (36.7 C), temperature source Oral, resp. rate 16, weight 127 lb 10.3 oz (57.9 kg), SpO2 100%.  Wt Readings from Last 3 Encounters:  12/24/23 127 lb 10.3 oz (57.9 kg)  11/06/23 128 lb (58.1 kg)  09/30/23 129 lb 10.1 oz (58.8 kg)    Body mass index is 25.78 kg/m.  PHYSICAL EXAM:   Physical Exam Vitals and nursing note reviewed. Exam conducted with a chaperone present.  Constitutional:      Appearance: Normal appearance.  Cardiovascular:     Rate and Rhythm: Normal rate and regular rhythm.     Pulses: Normal pulses.     Heart sounds: Murmur (improved) heard.  Pulmonary:     Effort: Pulmonary effort is normal.     Breath sounds: Normal breath sounds.  Abdominal:     Palpations: Abdomen is soft. There is no hepatomegaly, splenomegaly or mass.     Tenderness: There is no abdominal tenderness.  Musculoskeletal:     Right lower leg: No edema.     Left lower leg: No edema.  Lymphadenopathy:     Cervical: No cervical adenopathy.     Right cervical: No superficial, deep or posterior cervical adenopathy.    Left cervical: No superficial, deep or posterior cervical adenopathy.     Upper Body:     Right upper body: No supraclavicular or axillary adenopathy.     Left upper body: No supraclavicular or axillary adenopathy.  Neurological:     General: No focal deficit present.     Mental Status: She is alert and oriented to person, place, and time.  Psychiatric:        Mood and Affect: Mood normal.        Behavior: Behavior normal.     LABS:      Latest Ref Rng & Units 12/17/2023   10:58 AM 09/16/2023    3:38 AM 09/15/2023    5:28 PM  CBC  WBC 4.0 - 10.5 K/uL 9.0  13.0    Hemoglobin 12.0 - 15.0 g/dL 16.1  9.8  9.5   Hematocrit 36.0 - 46.0 % 36.0  29.3  28.0   Platelets 150 - 400  K/uL 216  190        Latest Ref Rng & Units 12/17/2023   10:58 AM 09/16/2023    3:38 AM 09/15/2023    5:28 PM  CMP  Glucose 70 - 99 mg/dL  096  045   BUN 8 - 23 mg/dL  15  15   Creatinine 4.09 - 1.00 mg/dL  8.11  9.14   Sodium 782 - 145 mmol/L  137  141   Potassium 3.5 - 5.1 mmol/L  3.7  3.6   Chloride 98 - 111 mmol/L  106  104   CO2 22 - 32 mmol/L  26    Calcium 8.9 - 10.3 mg/dL  8.5    Total Protein 6.5 - 8.1 g/dL 6.6  Total Bilirubin 0.0 - 1.2 mg/dL 1.6     Alkaline Phos 38 - 126 U/L 54     AST 15 - 41 U/L 27     ALT 0 - 44 U/L 16        No results found for: "CEA1", "CEA" / No results found for: "CEA1", "CEA" No results found for: "PSA1" No results found for: "WUJ811" No results found for: "CAN125"  No results found for: "TOTALPROTELP", "ALBUMINELP", "A1GS", "A2GS", "BETS", "BETA2SER", "GAMS", "MSPIKE", "SPEI" Lab Results  Component Value Date   TIBC 243 (L) 12/17/2023   TIBC 247 (L) 06/12/2023   TIBC 250 12/16/2022   FERRITIN 382 (H) 12/17/2023   FERRITIN 394 (H) 06/12/2023   FERRITIN 386 (H) 12/16/2022   IRONPCTSAT 33 (H) 12/17/2023   IRONPCTSAT 23 06/12/2023   IRONPCTSAT 32 (H) 12/16/2022   Lab Results  Component Value Date   LDH 160 12/17/2023   LDH 157 06/12/2023   LDH 174 12/16/2022     STUDIES:   No results found.

## 2023-12-25 ENCOUNTER — Encounter (HOSPITAL_COMMUNITY): Payer: Medicare HMO

## 2023-12-28 ENCOUNTER — Encounter (HOSPITAL_COMMUNITY): Payer: Medicare HMO

## 2023-12-30 ENCOUNTER — Encounter (HOSPITAL_COMMUNITY): Payer: Medicare HMO

## 2023-12-31 DIAGNOSIS — M25562 Pain in left knee: Secondary | ICD-10-CM | POA: Diagnosis not present

## 2023-12-31 DIAGNOSIS — M5442 Lumbago with sciatica, left side: Secondary | ICD-10-CM | POA: Diagnosis not present

## 2023-12-31 DIAGNOSIS — M9902 Segmental and somatic dysfunction of thoracic region: Secondary | ICD-10-CM | POA: Diagnosis not present

## 2023-12-31 DIAGNOSIS — M9905 Segmental and somatic dysfunction of pelvic region: Secondary | ICD-10-CM | POA: Diagnosis not present

## 2023-12-31 DIAGNOSIS — M9903 Segmental and somatic dysfunction of lumbar region: Secondary | ICD-10-CM | POA: Diagnosis not present

## 2024-01-25 DIAGNOSIS — J209 Acute bronchitis, unspecified: Secondary | ICD-10-CM | POA: Diagnosis not present

## 2024-01-25 DIAGNOSIS — R531 Weakness: Secondary | ICD-10-CM | POA: Diagnosis not present

## 2024-02-03 DIAGNOSIS — R059 Cough, unspecified: Secondary | ICD-10-CM | POA: Diagnosis not present

## 2024-02-08 ENCOUNTER — Other Ambulatory Visit: Payer: Self-pay

## 2024-02-08 ENCOUNTER — Ambulatory Visit: Admitting: Internal Medicine

## 2024-02-08 VITALS — BP 150/70 | HR 89 | Temp 98.3°F | Resp 20 | Ht 59.0 in | Wt 127.0 lb

## 2024-02-08 DIAGNOSIS — R058 Other specified cough: Secondary | ICD-10-CM

## 2024-02-08 DIAGNOSIS — J3089 Other allergic rhinitis: Secondary | ICD-10-CM

## 2024-02-08 MED ORDER — IPRATROPIUM BROMIDE 0.03 % NA SOLN: 1.0000 | Freq: Three times a day (TID) | NASAL | 3 refills | Status: DC | PRN

## 2024-02-08 MED ORDER — AZELASTINE HCL 0.1 % NA SOLN: 2.0000 | Freq: Two times a day (BID) | NASAL | 5 refills | Status: DC | PRN

## 2024-02-08 NOTE — Patient Instructions (Addendum)
 Chronic Rhinitis Post Viral Cough - SPT 02/2022: positive to dust mites - Use nasal saline rinses before nose sprays such as with Neilmed Sinus Rinse.  Use distilled water.   - Use Azelastine 2 sprays each nostril twice daily as needed for runny nose, drainage, sneezing, congestion. Aim upward and outward. - Use Ipratroprium 1-2 sprays up to three times daily as needed for runny nose. Aim upward and outward. - Use Allegra  180 mg daily.

## 2024-02-08 NOTE — Progress Notes (Signed)
   FOLLOW UP Date of Service/Encounter:  02/08/24   Subjective:  Dawn Lin (DOB: [birth year]) is a 83 y.o. female who [facility] for follow up for an acute visit.   History obtained from: chart review and patient. Last seen by me on July 06, 2019 for for allergic rhinitis and at the time was doing well on as needed ipratropium and Allegra .  About 2 weeks ago, started off with increased postnasal drip, congestion and feeling fatigued.  Now also has a mucoid cough.  Not discolored no fevers has been on prednisone , azithromycin  and now Levaquin.  Think she is getting better.  Chest x-ray was done on 423 that was normal.  Not using Allegra  or ipratropium at this time.  Doing some saline rinses.   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 Lorie Rook and Dr. Honey Lusty   Severe aortic stenosis     Objective:  BP (!) 150/70   Pulse 89   Temp 98.3 F (36.8 C)   Resp 20   Ht 4\' 11"  (1.499 m)   Wt 127 lb (57.6 kg)   LMP  (LMP Unknown)   SpO2 99%   BMI 25.65 kg/m  Body mass index is 25.65 kg/m. Physical Exam: GEN: alert, well developed HEENT: clear conjunctiva, nose with mild inferior turbinate hypertrophy, pink nasal mucosa, slight clear rhinorrhea, + cobblestoning HEART: regular rate and rhythm, no murmur LUNGS: clear to auscultation bilaterally, no coughing, unlabored respiration SKIN: no rashes or lesions  Assessment:   1. Perennial allergic rhinitis   2. Post-viral cough  syndrome     Plan/Recommendations:   Chronic Rhinitis Post Viral Cough - Discussed likely cause is from post nasal drip and post infectious cough syndrome.Will add Azelastine.   - SPT 02/2022: positive to dust mites - Use nasal saline rinses before nose sprays such as with Neilmed Sinus Rinse.  Use distilled water.   - Use Azelastine 2 sprays each nostril twice daily as needed for runny nose, drainage, sneezing, congestion. Aim upward and outward. - Use Ipratroprium 1-2 sprays up to three times daily as needed for runny nose. Aim upward and outward. - Use Allegra  180 mg daily.     Keep follow up in September.   Kristen Petri, MD [facility]

## 2024-02-16 DIAGNOSIS — Z961 Presence of intraocular lens: Secondary | ICD-10-CM | POA: Diagnosis not present

## 2024-02-16 DIAGNOSIS — H04123 Dry eye syndrome of bilateral lacrimal glands: Secondary | ICD-10-CM | POA: Diagnosis not present

## 2024-02-16 DIAGNOSIS — H43393 Other vitreous opacities, bilateral: Secondary | ICD-10-CM | POA: Diagnosis not present

## 2024-02-17 DIAGNOSIS — M9903 Segmental and somatic dysfunction of lumbar region: Secondary | ICD-10-CM | POA: Diagnosis not present

## 2024-02-17 DIAGNOSIS — M9905 Segmental and somatic dysfunction of pelvic region: Secondary | ICD-10-CM | POA: Diagnosis not present

## 2024-02-17 DIAGNOSIS — M25562 Pain in left knee: Secondary | ICD-10-CM | POA: Diagnosis not present

## 2024-02-17 DIAGNOSIS — M5442 Lumbago with sciatica, left side: Secondary | ICD-10-CM | POA: Diagnosis not present

## 2024-02-17 DIAGNOSIS — M9902 Segmental and somatic dysfunction of thoracic region: Secondary | ICD-10-CM | POA: Diagnosis not present

## 2024-03-03 ENCOUNTER — Ambulatory Visit: Payer: Medicare HMO | Attending: Cardiology | Admitting: Cardiology

## 2024-03-03 ENCOUNTER — Encounter: Payer: Self-pay | Admitting: Cardiology

## 2024-03-03 VITALS — BP 120/82 | HR 73 | Ht <= 58 in | Wt 128.2 lb

## 2024-03-03 DIAGNOSIS — E782 Mixed hyperlipidemia: Secondary | ICD-10-CM | POA: Diagnosis not present

## 2024-03-03 DIAGNOSIS — I10 Essential (primary) hypertension: Secondary | ICD-10-CM

## 2024-03-03 DIAGNOSIS — Z952 Presence of prosthetic heart valve: Secondary | ICD-10-CM | POA: Diagnosis not present

## 2024-03-03 NOTE — Progress Notes (Signed)
 Clinical Summary Ms. Wolven is a 83 y.o.female seen today for follow up of the following medical problems.      1. Coronary atherosclerosis - noted on prior CT scsan - 07/2017 nuclear stress: no clear ischemia.  07/2017 echo LVEF 60-65%, grade I diastolic dysfunction -07/2023 cath: no signifiacant CAD  -no recent chest pains.     2. HTN -  issues with low K on HCTZ, frequent urination - she is compliant with emds.    3. Hyperlipidemia - she is on on crestor   07/2020 LDL 84 HDL 67 TG 140 TC 175 - 07/2022 TC 198 TG 193 HDL 78 LDL 88 - 06/2023 TC 098 TG 119 HDL 72 LDL 72       4. Lung adenocarcinoma - s/p resection in 2018   5. SOB - 03/2017 PFTs: minimal perfusion defect      6. Aortic stenosis S/p TAVR 09/2023, 26 mm Medtronic Fx THV  Jan 2025 echo: LVEF 60-65%, grade I dd, normal TAVR  - no chest pain. Some SOB she relates to her chronic sinus/nasal congestion, allergies.     7. Vision change - episode of vision change, lightheadedness while driving - seen at [facility] ER.  - extensive workup in ER was unremarkable. 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 Lorie Rook and Dr. Honey Lusty   Severe aortic stenosis      Allergies  Allergen Reactions   Amoxicillin Diarrhea   Lisinopril  Cough     Current Outpatient Medications  Medication Sig  Dispense Refill   acetaminophen  (TYLENOL ) 500 MG tablet Take 1 tablet (500 mg total) by mouth every 6 (six) hours as needed. (Patient taking differently: Take 500-1,000 mg by mouth every 6 (six) hours as needed for moderate pain (pain score 4-6).) 30 tablet 0   Alpha-D-Galactosidase (BEANO PO) Take 2 capsules by mouth daily as needed (bloating).     amLODipine  (NORVASC ) 10 MG tablet Take 1 tablet (10 mg total) by mouth daily. 90 tablet 3   Ascorbic Acid (VITAMIN C PO) Take 564 mg by mouth daily. 282 mg each     aspirin  EC 81 MG tablet Take 81 mg by mouth daily.     azelastine  (ASTELIN ) 0.1 % nasal spray Place 2 sprays into both nostrils 2 (two) times daily as needed for rhinitis or allergies. Use in each nostril as directed 30 mL 5   azithromycin  (ZITHROMAX ) 500 MG tablet Take 1 tablet (500 mg total) by mouth as directed. Take one tablet 1 hour before any dental work including cleanings. 6 tablet 12   Calcium  Carb-Cholecalciferol (CALCIUM  500 + D PO) Take 1 tablet by mouth 2 (two) times daily.     cyanocobalamin  (VITAMIN B12) 1000 MCG tablet Take 1,000 mcg by mouth  daily. Sublingual     diclofenac Sodium (VOLTAREN ARTHRITIS PAIN) 1 % GEL Apply 2 g topically daily as needed (Back pain).     fexofenadine  (ALLEGRA ) 180 MG tablet Take 1 tablet (180 mg total) by mouth daily as needed for allergies. (Patient taking differently: Take 180 mg by mouth daily.) 90 tablet 3   folic acid  (FOLVITE ) 1 MG tablet Take 2 mg by mouth daily.     furosemide  (LASIX ) 20 MG tablet Take 1 tablet (20 mg total) by mouth daily as needed (swelling). 90 tablet 3   guaifenesin  (HUMIBID E) 400 MG TABS tablet Take 400-1,200 mg by mouth daily as needed (Drainage).     HYDROcodone  bit-homatropine (HYCODAN) 5-1.5 MG/5ML syrup Take 5 mLs by mouth at bedtime as needed for cough.     ibuprofen (ADVIL) 200 MG tablet Take 200-400 mg by mouth every 6 (six) hours as needed for moderate pain (pain score 4-6).     ipratropium (ATROVENT ) 0.03 %  nasal spray Place 1 spray into both nostrils 3 (three) times daily as needed for rhinitis. 90 mL 3   losartan  (COZAAR ) 25 MG tablet Take 0.5 tablets (12.5 mg total) by mouth daily. 45 tablet 3   meclizine (ANTIVERT) 12.5 MG tablet Take 12.5 mg by mouth every 6 (six) hours as needed for dizziness.     Multiple Vitamin (MULTIVITAMIN WITH MINERALS) TABS tablet Take 2 tablets by mouth daily. Gummy     potassium chloride  SA (KLOR-CON  M) 20 MEQ tablet Take 1 tablet (20 mEq total) by mouth as needed (when taking the lasix  for swelling). 90 tablet 3   predniSONE  (DELTASONE ) 5 MG tablet Take 5 mg by mouth daily with breakfast.     Propylene Glycol, PF, (SYSTANE COMPLETE PF) 0.6 % SOLN Place 1 drop into both eyes daily as needed (Dry eyes).     rosuvastatin  (CRESTOR ) 5 MG tablet Take 1 tablet (5 mg total) by mouth every morning. 90 tablet 3   simethicone  (MYLICON) 80 MG chewable tablet Chew 160 mg by mouth every 6 (six) hours as needed for flatulence.     Zinc 30 MG CAPS Take 30 mg by mouth 2 (two) times daily.     No current facility-administered medications for this visit.     Past Surgical History:  Procedure Laterality Date   BIOPSY  12/28/2020   Procedure: BIOPSY;  Surgeon: Urban Garden, MD;  [facility];  Service: Gastroenterology;;   COLONOSCOPY     COLONOSCOPY WITH PROPOFOL  N/A 12/28/2020   Procedure: COLONOSCOPY WITH PROPOFOL ;  Surgeon: Urban Garden, MD;  [facility]: [facility];  Service: Gastroenterology;  Laterality: N/A;  AM   ESOPHAGOGASTRODUODENOSCOPY (EGD) WITH PROPOFOL  N/A 12/28/2020   Procedure: ESOPHAGOGASTRODUODENOSCOPY (EGD) WITH PROPOFOL ;  Surgeon: Urban Garden, MD;  Location: [facility];  Service: Gastroenterology;  Laterality: N/A;   INGUINAL HERNIA REPAIR Left    INTRAOPERATIVE TRANSTHORACIC ECHOCARDIOGRAM N/A 09/15/2023   Procedure: INTRAOPERATIVE TRANSTHORACIC ECHOCARDIOGRAM;  Surgeon: Kyra Phy, MD;  Location: [facility];  Service: Cardiovascular;  Laterality: N/A;   LUMBAR DISC SURGERY  1990's   for severe pain and sciatica   PARTIAL HYSTERECTOMY  1970's   for bleeding and prolapse   RIGHT/LEFT HEART CATH AND CORONARY ANGIOGRAPHY N/A 08/06/2023   Procedure: RIGHT/LEFT HEART CATH AND CORONARY ANGIOGRAPHY;  Surgeon: Odie Benne, MD;  Location: [facility] CV [facility];  Service: Cardiovascular;  Laterality: N/A;   VIDEO ASSISTED THORACOSCOPY (VATS)/ LOBECTOMY Right 04/20/2017  Procedure: VIDEO ASSISTED THORACOSCOPY (VATS)/RIGHT UPPER LOBECTOMY;  Surgeon: Norita Beauvais, MD;  Location: [facility] OR;  Service: Thoracic;  Laterality: Right;   VIDEO BRONCHOSCOPY N/A 04/20/2017   Procedure: VIDEO BRONCHOSCOPY;  Surgeon: Norita Beauvais, MD;  Location[facility] OR;  Service: Thoracic;  Laterality: N/A;     Allergies  Allergen Reactions   Amoxicillin Diarrhea   Lisinopril  Cough      Family History  Problem Relation Age of Onset   Hypertension Mother    Asthma Sister    Heart attack Sister 65     Social History Ms. Lick reports that she has never smoked. She has never used smokeless tobacco. Ms. Jefferys reports no history of alcohol  use.    Physical Examination Today's Vitals   03/03/24 1135  BP: 120/82  Pulse: 73  SpO2: 97%  Weight: 128 lb 3.2 oz (58.2 kg)  Height: 4\' 10"  (1.473 m)  PainSc: 0-No pain   Body mass index is 26.79 kg/m.  Gen: resting comfortably, no acute distress HEENT: no scleral icterus, pupils equal round and reactive, no palptable cervical adenopathy,  CV: RRR, no mrg, no jvd Resp: Clear to auscultation bilaterally GI: abdomen is soft, non-tender, non-distended, normal bowel sounds, no hepatosplenomegaly MSK: extremities are warm, no edema.  Skin: warm, no rash Neuro:  no focal deficits Psych: appropriate affect   Diagnostic Studies  07/2018 nuclear stress Nuclear stress EF: 80%. There was no ST segment deviation noted during stress. There is  a large defect of moderate severity present in the basal anteroseptal, basal inferoseptal, mid anteroseptal, mid inferoseptal, apical septal and apex location. The defect is non-reversible. In the setting of normal LVF, this is likely due to breast attenuation artifact. No ischemia noted. This is a low risk study. The left ventricular ejection fraction is hyperdynamic (>65%).     07/2017 echo Study Conclusions   - Left ventricle: The cavity size was normal. Wall thickness was   normal. Systolic function was normal. The estimated ejection   fraction was in the range of 60% to 65%. Wall motion was normal;   there were no regional wall motion abnormalities. Doppler   parameters are consistent with abnormal left ventricular   relaxation (grade 1 diastolic dysfunction). - Aortic valve: There was trivial regurgitation.     02/2021 echo  1. Left ventricular ejection fraction, by estimation, is 60 to 65%. The  left ventricle has normal function. Left ventricular endocardial border  not optimally defined to evaluate regional wall motion. Left ventricular  diastolic parameters are  indeterminate. Elevated left atrial pressure.   2. Right ventricular systolic function is normal. The right ventricular  size is normal.   3. The mitral valve is normal in structure. No evidence of mitral valve  regurgitation. No evidence of mitral stenosis.   4. The aortic valve is tricuspid. There is mild calcification of the  aortic valve. There is mild thickening of the aortic valve. Aortic valve  regurgitation is mild. Mild to moderate aortic valve stenosis .Aortic  valve mean gradient measures 15.0 mmHg.  Aortic valve peak gradient measures 33.7 mmHg. Aortic valve area, by VTI  measures 1.15 cm.   5. The inferior vena cava is normal in size with greater than 50%  respiratory variability, suggesting right atrial pressure of 3 mmHg.    01/2023 echo IMPRESSIONS     1. Left ventricular ejection fraction, by  estimation, is 60 to 65%. The  left ventricle has normal function. The left ventricle has no  regional  wall motion abnormalities. Left ventricular diastolic parameters are  consistent with Grade I diastolic  dysfunction (impaired relaxation).   2. Right ventricular systolic function is normal. The right ventricular  size is normal.   3. The mitral valve is normal in structure. Trivial mitral valve  regurgitation. No evidence of mitral stenosis.   4. The aortic valve is tricuspid. There is severe calcifcation of the  aortic valve. Aortic valve regurgitation is moderate. Moderate to severe  aortic valve stenosis. Aortic valve area, by VTI measures 0.98 cm. Aortic  valve mean gradient measures 33.0  mmHg. Aortic valve Vmax measures 3.81 m/s.   5. The inferior vena cava is normal in size with greater than 50%  respiratory variability, suggesting right atrial pressure of 3 mmHg.    01/2023 carotid US  IMPRESSION: Color duplex indicates minimal heterogeneous and calcified plaque, with no hemodynamically significant stenosis by duplex criteria in the extracranial cerebrovascular circulation.         Assessment and Plan   1. Coronary atherosclerosis - noted by CT scan, nuclear stress test shows no significnat ischemia - recent cath shows no significant disease - EKG today shows NSR, no ischemic changes - continue risk factor modification   2. Hyperlipidemia - at goal, continue current meds   3. Aortic stenosis - s/p TAVR, recent echo shows normal functioning valve - continue to monitor   4. HTN - recurrent issues with low K, frequent urination on HCTZ. HCTZ was stopped - at goal, continue current meds  F/u 6 months    Laurann Pollock, M.D

## 2024-03-03 NOTE — Patient Instructions (Signed)

## 2024-03-16 DIAGNOSIS — M9902 Segmental and somatic dysfunction of thoracic region: Secondary | ICD-10-CM | POA: Diagnosis not present

## 2024-03-16 DIAGNOSIS — M5442 Lumbago with sciatica, left side: Secondary | ICD-10-CM | POA: Diagnosis not present

## 2024-03-16 DIAGNOSIS — M25562 Pain in left knee: Secondary | ICD-10-CM | POA: Diagnosis not present

## 2024-03-16 DIAGNOSIS — M9905 Segmental and somatic dysfunction of pelvic region: Secondary | ICD-10-CM | POA: Diagnosis not present

## 2024-03-16 DIAGNOSIS — M9903 Segmental and somatic dysfunction of lumbar region: Secondary | ICD-10-CM | POA: Diagnosis not present

## 2024-03-25 DIAGNOSIS — M9903 Segmental and somatic dysfunction of lumbar region: Secondary | ICD-10-CM | POA: Diagnosis not present

## 2024-03-25 DIAGNOSIS — M25562 Pain in left knee: Secondary | ICD-10-CM | POA: Diagnosis not present

## 2024-03-25 DIAGNOSIS — M5442 Lumbago with sciatica, left side: Secondary | ICD-10-CM | POA: Diagnosis not present

## 2024-03-25 DIAGNOSIS — M9902 Segmental and somatic dysfunction of thoracic region: Secondary | ICD-10-CM | POA: Diagnosis not present

## 2024-03-25 DIAGNOSIS — M9905 Segmental and somatic dysfunction of pelvic region: Secondary | ICD-10-CM | POA: Diagnosis not present

## 2024-04-11 DIAGNOSIS — M9902 Segmental and somatic dysfunction of thoracic region: Secondary | ICD-10-CM | POA: Diagnosis not present

## 2024-04-11 DIAGNOSIS — M5442 Lumbago with sciatica, left side: Secondary | ICD-10-CM | POA: Diagnosis not present

## 2024-04-11 DIAGNOSIS — M9905 Segmental and somatic dysfunction of pelvic region: Secondary | ICD-10-CM | POA: Diagnosis not present

## 2024-04-11 DIAGNOSIS — M9903 Segmental and somatic dysfunction of lumbar region: Secondary | ICD-10-CM | POA: Diagnosis not present

## 2024-04-11 DIAGNOSIS — M25562 Pain in left knee: Secondary | ICD-10-CM | POA: Diagnosis not present

## 2024-04-20 ENCOUNTER — Other Ambulatory Visit: Payer: Self-pay

## 2024-04-20 DIAGNOSIS — Z952 Presence of prosthetic heart valve: Secondary | ICD-10-CM

## 2024-04-29 DIAGNOSIS — M9905 Segmental and somatic dysfunction of pelvic region: Secondary | ICD-10-CM | POA: Diagnosis not present

## 2024-04-29 DIAGNOSIS — M9903 Segmental and somatic dysfunction of lumbar region: Secondary | ICD-10-CM | POA: Diagnosis not present

## 2024-04-29 DIAGNOSIS — M25562 Pain in left knee: Secondary | ICD-10-CM | POA: Diagnosis not present

## 2024-04-29 DIAGNOSIS — M9902 Segmental and somatic dysfunction of thoracic region: Secondary | ICD-10-CM | POA: Diagnosis not present

## 2024-04-29 DIAGNOSIS — M5442 Lumbago with sciatica, left side: Secondary | ICD-10-CM | POA: Diagnosis not present

## 2024-05-16 DIAGNOSIS — M5442 Lumbago with sciatica, left side: Secondary | ICD-10-CM | POA: Diagnosis not present

## 2024-05-16 DIAGNOSIS — M9902 Segmental and somatic dysfunction of thoracic region: Secondary | ICD-10-CM | POA: Diagnosis not present

## 2024-05-16 DIAGNOSIS — M9905 Segmental and somatic dysfunction of pelvic region: Secondary | ICD-10-CM | POA: Diagnosis not present

## 2024-05-16 DIAGNOSIS — M25562 Pain in left knee: Secondary | ICD-10-CM | POA: Diagnosis not present

## 2024-05-16 DIAGNOSIS — M9903 Segmental and somatic dysfunction of lumbar region: Secondary | ICD-10-CM | POA: Diagnosis not present

## 2024-05-23 DIAGNOSIS — M9902 Segmental and somatic dysfunction of thoracic region: Secondary | ICD-10-CM | POA: Diagnosis not present

## 2024-05-23 DIAGNOSIS — M9905 Segmental and somatic dysfunction of pelvic region: Secondary | ICD-10-CM | POA: Diagnosis not present

## 2024-05-23 DIAGNOSIS — M5442 Lumbago with sciatica, left side: Secondary | ICD-10-CM | POA: Diagnosis not present

## 2024-05-23 DIAGNOSIS — M25562 Pain in left knee: Secondary | ICD-10-CM | POA: Diagnosis not present

## 2024-05-23 DIAGNOSIS — M9903 Segmental and somatic dysfunction of lumbar region: Secondary | ICD-10-CM | POA: Diagnosis not present

## 2024-05-30 DIAGNOSIS — M5442 Lumbago with sciatica, left side: Secondary | ICD-10-CM | POA: Diagnosis not present

## 2024-05-30 DIAGNOSIS — M9902 Segmental and somatic dysfunction of thoracic region: Secondary | ICD-10-CM | POA: Diagnosis not present

## 2024-05-30 DIAGNOSIS — M9905 Segmental and somatic dysfunction of pelvic region: Secondary | ICD-10-CM | POA: Diagnosis not present

## 2024-05-30 DIAGNOSIS — M25562 Pain in left knee: Secondary | ICD-10-CM | POA: Diagnosis not present

## 2024-05-30 DIAGNOSIS — M9903 Segmental and somatic dysfunction of lumbar region: Secondary | ICD-10-CM | POA: Diagnosis not present

## 2024-06-16 ENCOUNTER — Inpatient Hospital Stay: Attending: Oncology

## 2024-06-16 DIAGNOSIS — D590 Drug-induced autoimmune hemolytic anemia: Secondary | ICD-10-CM | POA: Diagnosis not present

## 2024-06-16 LAB — RETICULOCYTES
Immature Retic Fract: 23.4 % — ABNORMAL HIGH (ref 2.3–15.9)
RBC.: 5.28 MIL/uL — ABNORMAL HIGH (ref 3.87–5.11)
Retic Count, Absolute: 142.6 K/uL (ref 19.0–186.0)
Retic Ct Pct: 2.7 % (ref 0.4–3.1)

## 2024-06-16 LAB — CBC WITH DIFFERENTIAL/PLATELET
Abs Immature Granulocytes: 0.08 K/uL — ABNORMAL HIGH (ref 0.00–0.07)
Basophils Absolute: 0.1 K/uL (ref 0.0–0.1)
Basophils Relative: 1 %
Eosinophils Absolute: 0.2 K/uL (ref 0.0–0.5)
Eosinophils Relative: 2 %
HCT: 36.3 % (ref 36.0–46.0)
Hemoglobin: 12 g/dL (ref 12.0–15.0)
Immature Granulocytes: 1 %
Lymphocytes Relative: 8 %
Lymphs Abs: 1 K/uL (ref 0.7–4.0)
MCH: 22.9 pg — ABNORMAL LOW (ref 26.0–34.0)
MCHC: 33.1 g/dL (ref 30.0–36.0)
MCV: 69.4 fL — ABNORMAL LOW (ref 80.0–100.0)
Monocytes Absolute: 0.8 K/uL (ref 0.1–1.0)
Monocytes Relative: 7 %
Neutro Abs: 9.8 K/uL — ABNORMAL HIGH (ref 1.7–7.7)
Neutrophils Relative %: 81 %
Platelets: 265 K/uL (ref 150–400)
RBC: 5.23 MIL/uL — ABNORMAL HIGH (ref 3.87–5.11)
RDW: 18.7 % — ABNORMAL HIGH (ref 11.5–15.5)
WBC: 11.9 K/uL — ABNORMAL HIGH (ref 4.0–10.5)
nRBC: 0.3 % — ABNORMAL HIGH (ref 0.0–0.2)

## 2024-06-16 LAB — HEPATIC FUNCTION PANEL
ALT: 14 U/L (ref 0–44)
AST: 25 U/L (ref 15–41)
Albumin: 4.2 g/dL (ref 3.5–5.0)
Alkaline Phosphatase: 51 U/L (ref 38–126)
Bilirubin, Direct: 0.2 mg/dL (ref 0.0–0.2)
Indirect Bilirubin: 1.6 mg/dL — ABNORMAL HIGH (ref 0.3–0.9)
Total Bilirubin: 1.8 mg/dL — ABNORMAL HIGH (ref 0.0–1.2)
Total Protein: 6.9 g/dL (ref 6.5–8.1)

## 2024-06-16 LAB — DIRECT ANTIGLOBULIN TEST (NOT AT ARMC)
DAT, IgG: POSITIVE
DAT, complement: POSITIVE

## 2024-06-16 LAB — LACTATE DEHYDROGENASE: LDH: 175 U/L (ref 98–192)

## 2024-06-23 ENCOUNTER — Inpatient Hospital Stay: Admitting: Oncology

## 2024-06-23 ENCOUNTER — Encounter: Payer: Self-pay | Admitting: *Deleted

## 2024-06-23 VITALS — BP 140/58 | HR 68 | Temp 98.0°F | Resp 18 | Wt 129.2 lb

## 2024-06-23 DIAGNOSIS — R7989 Other specified abnormal findings of blood chemistry: Secondary | ICD-10-CM | POA: Diagnosis not present

## 2024-06-23 DIAGNOSIS — G8929 Other chronic pain: Secondary | ICD-10-CM

## 2024-06-23 DIAGNOSIS — D590 Drug-induced autoimmune hemolytic anemia: Secondary | ICD-10-CM | POA: Diagnosis not present

## 2024-06-23 DIAGNOSIS — M545 Low back pain, unspecified: Secondary | ICD-10-CM | POA: Diagnosis not present

## 2024-06-23 MED ORDER — LIDOCAINE 5 % EX PTCH: 1.0000 | MEDICATED_PATCH | CUTANEOUS | 0 refills | Status: AC

## 2024-06-23 NOTE — Assessment & Plan Note (Addendum)
-   Ferritin is 382, down from 394.  Percent saturation is 33 and stable.  -She did not have iron levels drawn most recently.  Will add those on at her next lab draw.

## 2024-06-23 NOTE — Progress Notes (Signed)
 Lidocaine  1% patches approved by plan 06/23/24 and is valid through 10/12/2024.

## 2024-06-23 NOTE — Progress Notes (Signed)
 [facility] OFFICE PROGRESS NOTE  Rosamond Leta NOVAK, MD  ASSESSMENT & PLAN:    Assessment & Plan Drug-induced autoantibody type hemolytic anemia (HCC) - She denies any B symptoms or infections in the last 6 months. - She had TAVR procedure for aortic stenosis on 09/15/2023 when her hemoglobin dropped down to 9.3. - Labs on 06/16/2024: Hemoglobin 11.9.  MCV 69.4.  LDH is normal.  Reticulocyte count is normal 2.7.  DAT was positive for IgG and complement.  No antibodies were detected on Elute.  LFTs show elevated total bilirubin predominantly indirect bilirubin from hemolysis. - Continue prednisone  5 mg daily and folic acid  1 mg daily. - RTC 6 months for follow-up with repeat labs. Elevated ferritin - Ferritin is 382, down from 394.  Percent saturation is 33 and stable.  -She did not have iron levels drawn most recently.  Will add those on at her next lab draw. Chronic bilateral low back pain without sciatica Reports this is a chronic problem.  She is currently taking Tylenol  arthritis twice daily which is helping some. We discussed trying lidocaine  patches with acute pain or while she is gardening.  Prescription sent to pharmacy.  Orders Placed This Encounter  Procedures   Reticulocytes    Standing Status:   Future    Expected Date:   12/21/2024    Expiration Date:   03/21/2025   Lactate dehydrogenase    Standing Status:   Future    Expected Date:   12/21/2024    Expiration Date:   03/21/2025   CBC with Differential    Standing Status:   Future    Expected Date:   12/21/2024    Expiration Date:   03/21/2025   Hepatic function panel    Standing Status:   Future    Expected Date:   12/21/2024    Expiration Date:   03/21/2025   Iron and TIBC ([facility] ONLY)    Standing Status:   Future    Expected Date:   12/21/2024    Expiration Date:   03/21/2025   Ferritin    Standing Status:   Future    Expected Date:   12/21/2024    Expiration Date:   03/21/2025   Direct  antiglobulin test    Standing Status:   Future    Expected Date:   12/21/2024    Expiration Date:   03/21/2025    INTERVAL HISTORY: Patient returns for follow-up for autoimmune hemolytic anemia and elevated ferritin levels.  She also has history of lung adenocarcinoma status post resection back in 2018.  In the interim, she denies hospitalizations, surgeries or changes to her baseline health.  She was seen by cardiology on 03/03/2024 for coronary artery disease, hypertension and hyperlipidemia.  Everything appeared stable.  She also has seasonal allergies and is followed by an allergist.  Currently her symptoms are stable.  Reports appetite is 100% energy is at 50%.  She has pain in her lower back which is worse when she has been standing for long period.  Reports she takes Tylenol  arthritis twice per day which is helping some.  Reports she gets frustrated because she is unable to do the things she loves to do such as gardening.  We reviewed reticulocytes, LDH, CBC, hepatic function panel and direct antiglobulin test.  SUMMARY OF HEMATOLOGIC HISTORY: Oncology History   No history exists.   Autoimmune hemolytic anemia: - Diagnosed with AIHA with positive DAT on 09/24/2021. - Status post rituximab   weekly x4 completed on 11/28/2021.  She had relapse of hemolysis when prednisone  was discontinued.  Currently on prednisone  5 mg daily treated by Dr Ashok at [place].  She is switching care due to proximity. - She also has hereditary beta thalassemia trait (11/13/2020 hemoglobin electrophoresis: HbA-91.8%, HbA2-4.9%, HbF-3.3%) and reports her normal hemoglobin is between 10 and 11.    Social/family history: - She lives by herself at home.  She retired from office job.  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  Her half-sister is [place]  Leverne who is our patient.  She also worked on at tobacco farm.  She has exposure to [employer] and other fertilizers.  She was never  smoker. - Half sister has breast cancer.  Does not know her paternal side of the family.  3.  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 - Reviewed CT chest without contrast from 06/10/2022 which showed no evidence of recurrence or metastatic disease. - No need of further scans.   CBC    Component Value Date/Time   WBC 11.9 (H) 06/16/2024 1039   RBC 5.23 (H) 06/16/2024 1039   RBC 5.28 (H) 06/16/2024 1039   HGB 12.0 06/16/2024 1039   HCT 36.3 06/16/2024 1039   PLT 265 06/16/2024 1039   MCV 69.4 (L) 06/16/2024 1039   MCH 22.9 (L) 06/16/2024 1039   MCHC 33.1 06/16/2024 1039   RDW 18.7 (H) 06/16/2024 1039   LYMPHSABS 1.0 06/16/2024 1039   MONOABS 0.8 06/16/2024 1039   EOSABS 0.2 06/16/2024 1039   BASOSABS 0.1 06/16/2024 1039       Latest Ref Rng & Units 06/16/2024   10:39 AM 12/17/2023   10:58 AM 09/16/2023    3:38 AM  CMP  Glucose 70 - 99 mg/dL   869   BUN 8 - 23 mg/dL   15   Creatinine 9.55 - 1.00 mg/dL   9.27   Sodium 864 - 854 mmol/L   137   Potassium 3.5 - 5.1 mmol/L   3.7   Chloride 98 - 111 mmol/L   106   CO2 22 - 32 mmol/L   26   Calcium  8.9 - 10.3 mg/dL   8.5   Total Protein 6.5 - 8.1 g/dL 6.9  6.6    Total Bilirubin 0.0 - 1.2 mg/dL 1.8  1.6    Alkaline Phos 38 - 126 U/L 51  54    AST 15 - 41 U/L 25  27    ALT 0 - 44 U/L 14  16       Lab Results  Component Value Date   FERRITIN 382 (H) 12/17/2023   VITAMINB12 646 01/24/2022    Vitals:   06/23/24 1036  BP: (!) 140/58  Pulse: 68  Resp: 18  Temp: 98 F (36.7 C)  SpO2: 99%    Review of System:  Review of Systems  Constitutional:  Positive for malaise/fatigue.  Respiratory:  Positive for cough.   Gastrointestinal:  Positive for abdominal pain. Negative for blood in stool.  Musculoskeletal:  Positive for back pain and joint pain. Negative for falls.  Psychiatric/Behavioral:  The patient has insomnia.     Physical  Exam: Physical Exam Constitutional:      Appearance: Normal appearance.  HENT:     Head: Normocephalic and atraumatic.  Eyes:     Pupils: Pupils are equal, round, and reactive to light.  Cardiovascular:  Rate and Rhythm: Normal rate and regular rhythm.     Heart sounds: Normal heart sounds. No murmur heard. Pulmonary:     Effort: Pulmonary effort is normal.     Breath sounds: Normal breath sounds. No wheezing.  Abdominal:     General: Bowel sounds are normal. There is no distension.     Palpations: Abdomen is soft.     Tenderness: There is no abdominal tenderness.  Musculoskeletal:        General: Normal range of motion.     Cervical back: Normal range of motion.  Skin:    General: Skin is warm and dry.     Findings: No rash.  Neurological:     Mental Status: She is alert and oriented to person, place, and time.     Gait: Gait is intact.  Psychiatric:        Mood and Affect: Mood and affect normal.        Cognition and Memory: Memory normal.        Judgment: Judgment normal.      I spent 20 minutes dedicated to the care of this patient (face-to-face and non-face-to-face) on the date of the encounter to include what is described in the assessment and plan.,  Delon Hope, NP 06/23/2024 12:12 PM

## 2024-06-23 NOTE — Assessment & Plan Note (Addendum)
 Reports this is a chronic problem.  She is currently taking Tylenol  arthritis twice daily which is helping some. We discussed trying lidocaine  patches with acute pain or while she is gardening.  Prescription sent to pharmacy.

## 2024-06-23 NOTE — Assessment & Plan Note (Addendum)
-   She denies any B symptoms or infections in the last 6 months. - She had TAVR procedure for aortic stenosis on 09/15/2023 when her hemoglobin dropped down to 9.3. - Labs on 06/16/2024: Hemoglobin 11.9.  MCV 69.4.  LDH is normal.  Reticulocyte count is normal 2.7.  DAT was positive for IgG and complement.  No antibodies were detected on Elute.  LFTs show elevated total bilirubin predominantly indirect bilirubin from hemolysis. - Continue prednisone  5 mg daily and folic acid  1 mg daily. - RTC 6 months for follow-up with repeat labs.

## 2024-06-29 DIAGNOSIS — M5442 Lumbago with sciatica, left side: Secondary | ICD-10-CM | POA: Diagnosis not present

## 2024-06-29 DIAGNOSIS — M9902 Segmental and somatic dysfunction of thoracic region: Secondary | ICD-10-CM | POA: Diagnosis not present

## 2024-06-29 DIAGNOSIS — M9903 Segmental and somatic dysfunction of lumbar region: Secondary | ICD-10-CM | POA: Diagnosis not present

## 2024-06-29 DIAGNOSIS — M9905 Segmental and somatic dysfunction of pelvic region: Secondary | ICD-10-CM | POA: Diagnosis not present

## 2024-06-29 DIAGNOSIS — M25562 Pain in left knee: Secondary | ICD-10-CM | POA: Diagnosis not present

## 2024-07-04 ENCOUNTER — Ambulatory Visit: Payer: Medicare HMO | Admitting: Internal Medicine

## 2024-07-04 ENCOUNTER — Encounter: Payer: Self-pay | Admitting: Internal Medicine

## 2024-07-04 VITALS — BP 128/70 | HR 79 | Temp 97.9°F | Resp 12 | Ht <= 58 in | Wt 130.1 lb

## 2024-07-04 DIAGNOSIS — H04123 Dry eye syndrome of bilateral lacrimal glands: Secondary | ICD-10-CM | POA: Diagnosis not present

## 2024-07-04 DIAGNOSIS — J3089 Other allergic rhinitis: Secondary | ICD-10-CM

## 2024-07-04 MED ORDER — IPRATROPIUM BROMIDE 0.03 % NA SOLN: 1.0000 | Freq: Three times a day (TID) | NASAL | 5 refills | Status: AC | PRN

## 2024-07-04 MED ORDER — AZELASTINE HCL 0.1 % NA SOLN: 2.0000 | Freq: Two times a day (BID) | NASAL | 5 refills | Status: AC | PRN

## 2024-07-04 MED ORDER — FEXOFENADINE HCL 180 MG PO TABS: 180.0000 mg | ORAL_TABLET | Freq: Every day | ORAL | 5 refills | Status: AC

## 2024-07-04 NOTE — Patient Instructions (Addendum)
 Chronic Rhinitis - SPT 02/2022: positive to dust mites - Use nasal saline rinses before nose sprays such as with Neilmed Sinus Rinse.  Use distilled water.   - Use Azelastine  2 sprays each nostril twice daily as needed for runny nose, drainage, sneezing, congestion. Aim upward and outward. - Use Ipratroprium 1-2 sprays up to three times daily as needed for runny nose. Aim upward and outward. - Use Allegra  180 mg daily.   Dry Eyes - Use cold compress. - Use systane eye drops and refresh eye drops. - Follow up with your eye doctor if symptoms worsen.

## 2024-07-04 NOTE — Progress Notes (Addendum)
   FOLLOW UP Date of Service/Encounter:  07/04/24   Subjective:  Dawn Lin (DOB: Aug 09, 1941) is a 83 y.o. female who [facility] for follow up for chronic rhinitis.   History obtained from: chart review and patient. Last visit was on 02/08/2024 and at the time was having congestion, fatigue, drainage, cough.  Discussed likely viral infection and now post infectious cough with post nasal drainage, added Azelastine .  Reports on and off drainage but she is able to manage it.  Using Ipratropium almost daily and sometimes Azelastine .  On allegra  daily also.   Recently having trouble with dry eyes and feeling gritty.  No itching.  Using refresh eye drops.  Follows with eye doctor Dr Octavia. Denies any pain or vision changes.   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 Wendel and Dr. Maryjane   Severe aortic stenosis     Objective:  BP 128/70   Pulse 79   Temp 97.9 F (36.6 C)   Resp 12   Ht 4' 9.68 (1.465 m)   Wt 130 lb 2 oz (59 kg)   LMP  (LMP Unknown)   SpO2 96%   BMI 27.50 kg/m  Body mass index is 27.5 kg/m. Physical Exam: GEN: alert, well developed HEENT: slight erythema of bl conjunctiva, nose with mild inferior turbinate hypertrophy, pink nasal mucosa, no rhinorrhea, + cobblestoning HEART: regular rate and rhythm, no murmur LUNGS: clear to auscultation bilaterally, no coughing, unlabored respiration SKIN: no rashes or lesions  Assessment:   1. Perennial allergic  rhinitis   2. Dry eyes     Plan/Recommendations:   Chronic Rhinitis - On and of symptoms but manageable.  - SPT 02/2022: positive to dust mites - Use nasal saline rinses before nose sprays such as with Neilmed Sinus Rinse.  Use distilled water.   - Use Azelastine  2 sprays each nostril twice daily as needed for runny nose, drainage, sneezing, congestion. Aim upward and outward. - Use Ipratroprium 1-2 sprays up to three times daily as needed for runny nose. Aim upward and outward. - Use Allegra  180 mg daily.   Dry Eyes - Use cold compress. - Use systane eye drops and refresh eye drops. - Follow up with your eye doctor if symptoms worsen.        Return in about 1 year (around 07/04/2025).  Arleta Blanch, MD [facility]

## 2024-07-12 DIAGNOSIS — M810 Age-related osteoporosis without current pathological fracture: Secondary | ICD-10-CM | POA: Diagnosis not present

## 2024-07-12 DIAGNOSIS — R5383 Other fatigue: Secondary | ICD-10-CM | POA: Diagnosis not present

## 2024-07-12 DIAGNOSIS — Z79899 Other long term (current) drug therapy: Secondary | ICD-10-CM | POA: Diagnosis not present

## 2024-07-12 DIAGNOSIS — I10 Essential (primary) hypertension: Secondary | ICD-10-CM | POA: Diagnosis not present

## 2024-07-27 ENCOUNTER — Encounter (INDEPENDENT_AMBULATORY_CARE_PROVIDER_SITE_OTHER): Payer: Self-pay | Admitting: Gastroenterology

## 2024-07-29 DIAGNOSIS — M9902 Segmental and somatic dysfunction of thoracic region: Secondary | ICD-10-CM | POA: Diagnosis not present

## 2024-07-29 DIAGNOSIS — M5442 Lumbago with sciatica, left side: Secondary | ICD-10-CM | POA: Diagnosis not present

## 2024-07-29 DIAGNOSIS — M9903 Segmental and somatic dysfunction of lumbar region: Secondary | ICD-10-CM | POA: Diagnosis not present

## 2024-07-29 DIAGNOSIS — M546 Pain in thoracic spine: Secondary | ICD-10-CM | POA: Diagnosis not present

## 2024-07-29 DIAGNOSIS — M9905 Segmental and somatic dysfunction of pelvic region: Secondary | ICD-10-CM | POA: Diagnosis not present

## 2024-08-15 ENCOUNTER — Emergency Department (HOSPITAL_COMMUNITY)

## 2024-08-15 ENCOUNTER — Emergency Department (HOSPITAL_COMMUNITY)
Admission: EM | Admit: 2024-08-15 | Discharge: 2024-08-15 | Disposition: A | Discharge status: Home / Self Care | Attending: Emergency Medicine | Admitting: Emergency Medicine

## 2024-08-15 ENCOUNTER — Encounter (HOSPITAL_COMMUNITY): Payer: Self-pay | Admitting: Emergency Medicine

## 2024-08-15 ENCOUNTER — Other Ambulatory Visit: Payer: Self-pay

## 2024-08-15 DIAGNOSIS — R55 Syncope and collapse: Secondary | ICD-10-CM | POA: Diagnosis not present

## 2024-08-15 DIAGNOSIS — M79669 Pain in unspecified lower leg: Secondary | ICD-10-CM | POA: Diagnosis not present

## 2024-08-15 DIAGNOSIS — R0989 Other specified symptoms and signs involving the circulatory and respiratory systems: Secondary | ICD-10-CM | POA: Diagnosis not present

## 2024-08-15 DIAGNOSIS — Z79899 Other long term (current) drug therapy: Secondary | ICD-10-CM | POA: Insufficient documentation

## 2024-08-15 DIAGNOSIS — R531 Weakness: Secondary | ICD-10-CM | POA: Diagnosis not present

## 2024-08-15 DIAGNOSIS — S80811A Abrasion, right lower leg, initial encounter: Secondary | ICD-10-CM | POA: Diagnosis not present

## 2024-08-15 DIAGNOSIS — W19XXXA Unspecified fall, initial encounter: Secondary | ICD-10-CM | POA: Diagnosis not present

## 2024-08-15 DIAGNOSIS — I509 Heart failure, unspecified: Secondary | ICD-10-CM | POA: Diagnosis not present

## 2024-08-15 DIAGNOSIS — R42 Dizziness and giddiness: Secondary | ICD-10-CM | POA: Diagnosis present

## 2024-08-15 DIAGNOSIS — S0083XA Contusion of other part of head, initial encounter: Secondary | ICD-10-CM | POA: Diagnosis not present

## 2024-08-15 DIAGNOSIS — Z7982 Long term (current) use of aspirin: Secondary | ICD-10-CM | POA: Diagnosis not present

## 2024-08-15 DIAGNOSIS — R918 Other nonspecific abnormal finding of lung field: Secondary | ICD-10-CM | POA: Diagnosis not present

## 2024-08-15 DIAGNOSIS — S8011XA Contusion of right lower leg, initial encounter: Secondary | ICD-10-CM

## 2024-08-15 DIAGNOSIS — M1710 Unilateral primary osteoarthritis, unspecified knee: Secondary | ICD-10-CM | POA: Diagnosis not present

## 2024-08-15 LAB — CBC WITH DIFFERENTIAL/PLATELET
Abs Immature Granulocytes: 0.03 K/uL (ref 0.00–0.07)
Basophils Absolute: 0.1 K/uL (ref 0.0–0.1)
Basophils Relative: 1 %
Eosinophils Absolute: 0 K/uL (ref 0.0–0.5)
Eosinophils Relative: 0 %
HCT: 41.9 % (ref 36.0–46.0)
Hemoglobin: 13.3 g/dL (ref 12.0–15.0)
Immature Granulocytes: 0 %
Lymphocytes Relative: 9 %
Lymphs Abs: 0.8 K/uL (ref 0.7–4.0)
MCH: 20.4 pg — ABNORMAL LOW (ref 26.0–34.0)
MCHC: 31.7 g/dL (ref 30.0–36.0)
MCV: 64.4 fL — ABNORMAL LOW (ref 80.0–100.0)
Monocytes Absolute: 0.4 K/uL (ref 0.1–1.0)
Monocytes Relative: 5 %
Neutro Abs: 7.6 K/uL (ref 1.7–7.7)
Neutrophils Relative %: 85 %
Platelets: 249 K/uL (ref 150–400)
RBC: 6.51 MIL/uL — ABNORMAL HIGH (ref 3.87–5.11)
RDW: 17.9 % — ABNORMAL HIGH (ref 11.5–15.5)
WBC: 8.9 K/uL (ref 4.0–10.5)
nRBC: 0.2 % (ref 0.0–0.2)

## 2024-08-15 LAB — COMPREHENSIVE METABOLIC PANEL WITH GFR
ALT: 13 U/L (ref 0–44)
AST: 27 U/L (ref 15–41)
Albumin: 4.6 g/dL (ref 3.5–5.0)
Alkaline Phosphatase: 63 U/L (ref 38–126)
Anion gap: 12 (ref 5–15)
BUN: 14 mg/dL (ref 8–23)
CO2: 26 mmol/L (ref 22–32)
Calcium: 9.6 mg/dL (ref 8.9–10.3)
Chloride: 106 mmol/L (ref 98–111)
Creatinine, Ser: 0.65 mg/dL (ref 0.44–1.00)
GFR, Estimated: >60 mL/min (ref >60)
Glucose, Bld: 109 mg/dL — ABNORMAL HIGH (ref 70–99)
Potassium: 4 mmol/L (ref 3.5–5.1)
Sodium: 143 mmol/L (ref 135–145)
Total Bilirubin: 1.2 mg/dL (ref 0.0–1.2)
Total Protein: 6.8 g/dL (ref 6.5–8.1)

## 2024-08-15 LAB — URINALYSIS, ROUTINE W REFLEX MICROSCOPIC
Bilirubin Urine: NEGATIVE
Glucose, UA: NEGATIVE mg/dL
Hgb urine dipstick: NEGATIVE
Ketones, ur: NEGATIVE mg/dL
Leukocytes,Ua: NEGATIVE
Nitrite: NEGATIVE
Protein, ur: NEGATIVE mg/dL
Specific Gravity, Urine: 1.005 (ref 1.005–1.030)
pH: 8 (ref 5.0–8.0)

## 2024-08-15 LAB — PRO BRAIN NATRIURETIC PEPTIDE: Pro Brain Natriuretic Peptide: 125 pg/mL (ref <300.0)

## 2024-08-15 LAB — TROPONIN T, HIGH SENSITIVITY: Troponin T High Sensitivity: <15 ng/L (ref 0–19)

## 2024-08-15 LAB — CBG MONITORING, ED: Glucose-Capillary: 120 mg/dL — ABNORMAL HIGH (ref 70–99)

## 2024-08-15 MED ORDER — MECLIZINE HCL 12.5 MG PO TABS
25.0000 mg | ORAL_TABLET | Freq: Once | ORAL | Status: AC
  Administered 2024-08-15: 25 mg via ORAL

## 2024-08-15 MED ORDER — SODIUM CHLORIDE 0.9 % IV BOLUS
500.0000 mL | Freq: Once | INTRAVENOUS | Status: AC
  Administered 2024-08-15: 500 mL via INTRAVENOUS

## 2024-08-15 MED ORDER — IOHEXOL 350 MG/ML SOLN
75.0000 mL | Freq: Once | INTRAVENOUS | Status: AC | PRN
  Administered 2024-08-15: 75 mL via INTRAVENOUS

## 2024-08-15 NOTE — Discharge Instructions (Signed)
 Please follow-up closely with your primary care doctor on an outpatient basis.  Return to emergency department immediately for any new or worsening symptoms.

## 2024-08-15 NOTE — ED Provider Notes (Signed)
 [place] EMERGENCY DEPARTMENT AT [facility] Provider Note   CSN: [id number] Arrival date & time: 08/15/24  1241     Patient presents with: Fall and Weakness   Katiria Lin is a 83 y.o. female.   Patient is an 83 year old female who presents to the emergency department with a chief complaint of intermittent dizziness, generalized weakness, intermittent diplopia and a fall.  Patient notes that she had worsening dizziness this morning upon standing as well as associated generalized weakness.  This did cause her to fall onto her knees.  Patient states that she did not strike her head or injure her neck or back during the fall.  She notes that she has had no associated chest pain, shortness of breath, palpitations.  She denies any abdominal pain, nausea, vomiting, diarrhea.  She notes that she does have a history of CHF and has had no increased edema to her lower extremities.  She denies any active dizziness at this time.  She does note that she has had a strongodor to her urine and has had some increased urinary frequency as well.   Fall  Weakness      Prior to Admission medications   Medication Sig Start Date End Date Taking? Authorizing Provider  acetaminophen  (TYLENOL ) 650 MG CR tablet Take 650 mg by mouth every 8 (eight) hours as needed for pain.    [provider]  Alpha-D-Galactosidase (BEANO PO) Take 2 capsules by mouth daily as needed (bloating).    [provider]  amLODipine  (NORVASC ) 10 MG tablet Take 1 tablet (10 mg total) by mouth daily. 11/06/23   Sebastian Lamarr SAUNDERS, PA-C  Ascorbic Acid (VITAMIN C PO) Take 564 mg by mouth daily. 282 mg each    [provider]  aspirin  EC 81 MG tablet Take 81 mg by mouth daily.    [provider]  azelastine  (ASTELIN ) 0.1 % nasal spray Place 2 sprays into both nostrils 2 (two) times daily as needed for rhinitis or allergies. Use in each nostril as directed 07/04/24   Tobie Arleta SQUIBB,  MD  azithromycin  (ZITHROMAX ) 500 MG tablet Take 1 tablet (500 mg total) by mouth as directed. Take one tablet 1 hour before any dental work including cleanings. 09/25/23   Sebastian Lamarr SAUNDERS, PA-C  Calcium  Carb-Cholecalciferol (CALCIUM  500 + D PO) Take 1 tablet by mouth 2 (two) times daily.    [provider]  carboxymethylcellulose (REFRESH PLUS) 0.5 % SOLN Place 1 drop into both eyes 3 (three) times daily as needed.    [provider]  cyanocobalamin  (VITAMIN B12) 1000 MCG tablet Take 1,000 mcg by mouth daily. Sublingual    [provider]  diclofenac Sodium (VOLTAREN ARTHRITIS PAIN) 1 % GEL Apply 2 g topically daily as needed (Back pain).    [provider]  fexofenadine  (ALLEGRA ) 180 MG tablet Take 1 tablet (180 mg total) by mouth daily. 07/04/24   Tobie Arleta SQUIBB, MD  folic acid  (FOLVITE ) 1 MG tablet Take 2 mg by mouth daily.    [provider]  furosemide  (LASIX ) 20 MG tablet Take 1 tablet (20 mg total) by mouth daily as needed (swelling). 11/06/23   Sebastian Lamarr SAUNDERS, PA-C  guaifenesin  (HUMIBID E) 400 MG TABS tablet Take 400-1,200 mg by mouth daily as needed (Drainage).    [provider]  HYDROcodone  bit-homatropine (HYCODAN) 5-1.5 MG/5ML syrup Take 5 mLs by mouth at bedtime as needed for cough. 08/31/23   [provider]  ipratropium (  ATROVENT ) 0.03 % nasal spray Place 1 spray into both nostrils 3 (three) times daily as needed for rhinitis. 07/04/24   Tobie Arleta SQUIBB, MD  lidocaine  (LIDODERM ) 5 % Place 1 patch onto the skin daily. Remove & Discard patch within 12 hours or as directed by MD 06/23/24   Geofm Delon BRAVO, NP  losartan  (COZAAR ) 25 MG tablet Take 0.5 tablets (12.5 mg total) by mouth daily. 11/06/23 06/23/24  Sebastian Lamarr SAUNDERS, PA-C  meclizine (ANTIVERT) 12.5 MG tablet Take 12.5 mg by mouth every 6 (six) hours as needed for dizziness. 11/19/19   [provider]  Multiple Vitamin (MULTIVITAMIN WITH MINERALS) TABS  tablet Take 2 tablets by mouth daily. Gummy    [provider]  potassium chloride  SA (KLOR-CON  M) 20 MEQ tablet Take 1 tablet (20 mEq total) by mouth as needed (when taking the lasix  for swelling). 11/06/23   Sebastian Lamarr SAUNDERS, PA-C  predniSONE  (DELTASONE ) 5 MG tablet Take 5 mg by mouth daily with breakfast. 11/19/21   [provider]  rosuvastatin  (CRESTOR ) 5 MG tablet Take 1 tablet (5 mg total) by mouth every morning. 11/06/23   Sebastian Lamarr SAUNDERS, PA-C  SALINE NASAL MIST NA  02/03/24   [provider]  simethicone  (MYLICON) 80 MG chewable tablet Chew 160 mg by mouth every 6 (six) hours as needed for flatulence.    [provider]    Allergies: Amoxicillin and Lisinopril     Review of Systems  Neurological:  Positive for weakness.  All other systems reviewed and are negative.   Updated Vital Signs BP (!) 172/67 (BP Location: Right Arm)   Pulse 71   Temp 98.4 F (36.9 C) (Oral)   Resp 15   LMP  (LMP Unknown)   SpO2 94%   Physical Exam Vitals and nursing note reviewed.  Constitutional:      General: She is not in acute distress.    Appearance: Normal appearance. She is not ill-appearing.  HENT:     Head: Normocephalic and atraumatic.     Nose: Nose normal.     Mouth/Throat:     Mouth: Mucous membranes are moist.  Eyes:     Extraocular Movements: Extraocular movements intact.     Conjunctiva/sclera: Conjunctivae normal.     Pupils: Pupils are equal, round, and reactive to light.  Cardiovascular:     Rate and Rhythm: Normal rate and regular rhythm.     Pulses: Normal pulses.     Heart sounds: Normal heart sounds. No murmur heard.    No gallop.  Pulmonary:     Effort: Pulmonary effort is normal. No respiratory distress.     Breath sounds: Normal breath sounds. No stridor. No wheezing, rhonchi or rales.  Abdominal:     General: Abdomen is flat. Bowel sounds are normal. There is no distension.     Palpations: Abdomen is soft.      Tenderness: There is no abdominal tenderness. There is no guarding.  Musculoskeletal:        General: Normal range of motion.     Cervical back: Normal range of motion and neck supple. No rigidity or tenderness.     Comments: Tenderness palpation noted over the superior right tib-fib region with overlying abrasions, nontender palpation remainder bilateral lower extremities, nontender palpation over thoracic or lumbar spine, no step-off or deformity, DP and PT pulses are 2+ lower extremities and radial pulses 2+ distally bilateral upper extremities, full range of motion is noted throughout, pelvis is stable to  AP and lateral compression, no tenderness palpation of the bilateral hips, no obvious deformity or bruising, no skin breakdown or ulceration  Skin:    General: Skin is warm and dry.     Findings: No bruising or rash.  Neurological:     General: No focal deficit present.     Mental Status: She is alert and oriented to person, place, and time. Mental status is at baseline.     Cranial Nerves: No cranial nerve deficit.     Sensory: No sensory deficit.     Motor: No weakness.     Coordination: Coordination normal.  Psychiatric:        Mood and Affect: Mood normal.        Behavior: Behavior normal.        Thought Content: Thought content normal.        Judgment: Judgment normal.     (all labs ordered are listed, but only abnormal results are displayed) Labs Reviewed  URINE CULTURE  CBC WITH DIFFERENTIAL/PLATELET  COMPREHENSIVE METABOLIC PANEL WITH GFR  URINALYSIS, ROUTINE W REFLEX MICROSCOPIC  PRO BRAIN NATRIURETIC PEPTIDE  CBG MONITORING, ED  TROPONIN T, HIGH SENSITIVITY    EKG: None  Radiology: No results found.   Procedures   Medications Ordered in the ED - No data to display                                  Medical Decision Making Amount and/or Complexity of Data Reviewed Labs: ordered. Radiology: ordered.  Risk Prescription drug management.   This patient  presents to the ED for concern of weakness, dizziness, fall differential diagnosis includes dehydration, electrolyte derangement, acute kidney injury, sepsis, pneumonia, urinary tract infection, CVA, TIA, intracranial hemorrhage, pulmonary embolus, ACS    Additional history obtained:  Additional history obtained from none External records from outside source obtained and reviewed including none   Lab Tests:  I Ordered, and personally interpreted labs.  The pertinent results include: No leukocytosis, no anemia, normal kidney function liver function, normal electrolytes, unremarkable urinalysis, negative BNP, negative troponin   Imaging Studies ordered:  I ordered imaging studies including CT scan head, CTA chest, MRI brain, chest x-ray, x-ray of right tib-fib I independently visualized and interpreted imaging which showed no acute intracranial hemorrhage, no pulmonary embolus, atelectasis, no ischemic change on MRI, no acute osseous injury of right tib-fib region I agree with the radiologist interpretation   Medicines ordered and prescription drug management:  I ordered medication including IV fluids, meclizine for dizziness Reevaluation of the patient after these medicines showed that the patient improved I have reviewed the patients home medicines and have made adjustments as needed   Problem List / ED Course:  Patient is doing much better at this time and is stable for discharge home.  Patient notes that she does feel greatly improved with treatment in the emergency department.  She was able to ambulate with nursing staff under her own power and without difficulty.  Patient does note that she does wish for discharge home at this time.  MRI of the brain and CT of head was unremarkable with no indication for CVA.  Patient has no pulmonary embolus noted on CT of the chest.  She did have negative troponin and BNP.  EKG had no acute ischemic changes.  Urinalysis demonstrates no indication  for urinary tract infection.  Blood work was unremarkable at this time.  The  need for close follow-up with PCP was discussed as well as strict turn precautions for any new or worsening symptoms.  Patient voiced understanding and had no additional questions.   Social Determinants of Health:  None        Final diagnoses:  None    ED Discharge Orders     None          Dawn Lin 08/15/24 2121    Melvenia Motto, MD 08/15/24 2249

## 2024-08-15 NOTE — ED Triage Notes (Signed)
 Pt bib rcems w/ c/o a fall. Pt reports she has been weak recently and had spells of dizziness causing her to fall today. PT reports she fell on her knees. Pt also reports a foul smell to her urine with frequency. Pt denies painful urination. Odor is noticeable at bedside.

## 2024-08-17 LAB — URINE CULTURE

## 2024-08-25 MED FILL — Sodium Chloride IV Soln 0.9%: INTRAVENOUS | Qty: 500 | Status: AC

## 2024-09-14 ENCOUNTER — Other Ambulatory Visit: Payer: Self-pay | Admitting: Nurse Practitioner

## 2024-09-14 DIAGNOSIS — Z1231 Encounter for screening mammogram for malignant neoplasm of breast: Secondary | ICD-10-CM

## 2024-09-22 ENCOUNTER — Inpatient Hospital Stay (HOSPITAL_COMMUNITY)
Admission: RE | Admit: 2024-09-22 | Discharge: 2024-09-22 | Attending: Nurse Practitioner | Admitting: Nurse Practitioner

## 2024-09-22 ENCOUNTER — Encounter (HOSPITAL_COMMUNITY): Payer: Self-pay

## 2024-09-22 DIAGNOSIS — Z1231 Encounter for screening mammogram for malignant neoplasm of breast: Secondary | ICD-10-CM | POA: Diagnosis not present

## 2024-09-28 NOTE — Progress Notes (Unsigned)
 [facility]   MULTIDISCIPLINARY HEART VALVE CLINIC                                     Cardiology Office Note:    Date:  09/29/2024   ID:  Dawn Lin, DOB 27-Jul-1941, MRN [id number]  PCP:  Rosamond Leta NOVAK, MD  [facility] Cardiologist:  Alvan Carrier, MD  [facility] Structural heart: Lurena MARLA Red, MD [facility] Electrophysiologist:  None   Referring MD: Rosamond Leta NOVAK, MD   1 year s/p TAVR   History of Present Illness:    Dawn Lin is a 83 y.o. 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  She is followed by heme onc for beta thalassemia trait and autoimmune hemolytic anemia. She had been followed over time for aortic stenosis. Echo 07/29/23 showed EF 70% and progression to severe AS with a mean grad 40 mmHg, AVA 0.70 cm2, and mild AI. [facility] 08/06/23 showed no CAD and normal filling pressures. S/p TAVR with a 26 mm Medtronic Fx THV via the TF approach on 09/15/23. Post operative echo showed EF 65%, normally functioning TAVR with a mean gradient of 11 mmHg and no PVL. She was discharged on Asprin 81 mg daily.   Seen in ER on 08/15/24 for dizziness. MRI of the brain and CT of head was unremarkable with no indication for CVA. Patient has no pulmonary embolus noted on CT of the chest. Extensive work up negative.   Today the patient presents to clinic for follow up. Here with her daughter in law, [place]. No CP or SOB. No LE edema, orthopnea or PND. No dizziness or syncope. No blood in stool or urine. No palpitations. Mostly limited by spinal stenosis and back pain. She did have a fall getting out of bed recently. She did not seek care.    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 Red and Dr. Maryjane   Severe aortic stenosis      Current Medications: Active Medications[1]    ROS:   Please see the history of present illness.    All other systems reviewed and are negative.  EKGs       Risk Assessment/Calculations:           Physical Exam:    VS:  BP 128/62   Pulse 76   Ht 4' 9.5 (1.461 m)   Wt 128 lb 3.2 oz (58.2 kg)   LMP  (LMP Unknown)   SpO2 98%   BMI 27.26 kg/m     Wt Readings from Last 3 Encounters:  09/29/24 128 lb 3.2 oz (58.2 kg)  07/04/24 130 lb 2 oz (59 kg)  06/23/24 129 lb 3 oz (58.6 kg)     GEN: Well nourished, well  developed in no acute distress NECK: No JVD CARDIAC: RRR, no murmurs, rubs, gallops RESPIRATORY:  Clear to auscultation without rales, wheezing or rhonchi  ABDOMEN: Soft, non-tender, non-distended EXTREMITIES:  No edema; No deformity.    ASSESSMENT:    1. S/P TAVR (transcatheter aortic valve replacement)   2. Chronic heart failure with preserved ejection fraction (HFpEF) (HCC)   3. Essential hypertension   4. Mixed hyperlipidemia   5. Other non-autoimmune hemolytic anemias (HCC)     PLAN:    In order of problems listed above:  Severe AS s/p TAVR:  -- Echo today shows EF 65%, normally functioning TAVR with a mean gradient of 8 mm hg and no PVL.  -- NYHA class I symptoms; mostly limited by back pain.   -- Continue Aspirin  81mg  daily.  -- SBE prophylaxis discussed; she has azithromycin  due to a PCN allergy .  -- Continue regular follow up with Dr. Alvan with annual echo.  HFpEF:  -- Appears euvolemic.  -- Continue Lasix  20 mg/KClor 20 meq as needed -- Recent creat 0.65 and K 4.0 on  recent labs (personally reviewed).    HTN:  -- BP well controlled.  -- Continue on amlodipine  10mg  daily and losartan  12.5mg  daily as well as PRN Lasix Dereck   HLD:  -- Continue Crestor  5mg  daily.    Autoimmune hemolytic anemia:  -- Followed by heme onc. Continue prednisone  5 mg daily and folic acid  2 mg daily. -- Recent Hg 13.3 on recent labs (personally reviewed).     Medication Adjustments/Labs and Tests Ordered: Current medicines are reviewed at length with the patient today.  Concerns regarding medicines are outlined above.  Orders Placed This Encounter  Procedures   ECHOCARDIOGRAM COMPLETE   No orders of the defined types were placed in this encounter.   Patient Instructions  Medication Instructions:  Your physician recommends that you continue on your current medications as directed. Please refer to the Current Medication list given to you today.  *If you need a refill on your cardiac medications before your next appointment, please call your pharmacy*  Lab Work: None needed If you have labs (blood work) drawn today and your tests are completely normal, you will receive your results only by: MyChart Message (if you have MyChart) OR A paper copy in the mail If you have any lab test that is abnormal or we need to change your treatment, we will call you to review the results.  Testing/Procedures: 09/25/2025 Your physician has requested that you have an echocardiogram. Echocardiography is a painless test that uses sound waves to create images of your heart. It provides your doctor with information about the size and shape of your heart and how well your hearts chambers and valves are working. This procedure takes approximately one hour. There are no restrictions for this procedure. Please do NOT wear cologne, perfume, aftershave, or lotions (deodorant is allowed). Please arrive 15 minutes prior to your appointment time.  Please note: We ask at that you not bring children  with you during ultrasound (echo/ vascular) testing. Due to room size and safety concerns, children are not allowed in the ultrasound rooms during exams. Our front office staff cannot provide observation of children in our lobby area while testing is being conducted. An adult accompanying a patient to their appointment will only be allowed in the ultrasound room at the discretion of the ultrasound technician under special circumstances. We apologize for any inconvenience.   Follow-Up: At [facility], you and your health  needs are our priority.  As part of our continuing mission to provide you with exceptional heart care, our providers are all part of one team.  This team includes your primary Cardiologist (physician) and Advanced Practice Providers or APPs (Physician Assistants and Nurse Practitioners) who all work together to provide you with the care you need, when you need it.  Your next appointment:   12 month(s)  Provider:   Dorn Ross, MD    We recommend signing up for the patient portal called MyChart.  Sign up information is provided on this After Visit Summary.  MyChart is used to connect with patients for Virtual Visits (Telemedicine).  Patients are able to view lab/test results, encounter notes, upcoming appointments, etc.  Non-urgent messages can be sent to your provider as well.   To learn more about what you can do with MyChart, go to [email or web address].            Signed, Lamarr Hummer, PA-C  09/29/2024 3:18 PM    [facility]     [1]  Current Meds  Medication Sig   acetaminophen  (TYLENOL ) 650 MG CR tablet Take 650 mg by mouth every 8 (eight) hours as needed for pain.   Alpha-D-Galactosidase (BEANO PO) Take 2 capsules by mouth daily as needed (bloating).   amLODipine  (NORVASC ) 10 MG tablet Take 1 tablet (10 mg total) by mouth daily.   Ascorbic Acid (VITAMIN C PO) Take 564 mg by mouth daily. 282 mg each   aspirin  EC 81 MG  tablet Take 81 mg by mouth daily.   azelastine  (ASTELIN ) 0.1 % nasal spray Place 2 sprays into both nostrils 2 (two) times daily as needed for rhinitis or allergies. Use in each nostril as directed   azithromycin  (ZITHROMAX ) 500 MG tablet Take 1 tablet (500 mg total) by mouth as directed. Take one tablet 1 hour before any dental work including cleanings.   Calcium  Carb-Cholecalciferol (CALCIUM  500 + D PO) Take 1 tablet by mouth 2 (two) times daily.   carboxymethylcellulose (REFRESH PLUS) 0.5 % SOLN Place 1 drop into both eyes 3 (three) times daily as needed.   diclofenac Sodium (VOLTAREN ARTHRITIS PAIN) 1 % GEL Apply 2 g topically daily as needed (Back pain).   folic acid  (FOLVITE ) 1 MG tablet Take 2 mg by mouth daily.   furosemide  (LASIX ) 20 MG tablet Take 1 tablet (20 mg total) by mouth daily as needed (swelling).   HYDROcodone  bit-homatropine (HYCODAN) 5-1.5 MG/5ML syrup Take 5 mLs by mouth at bedtime as needed for cough.   ipratropium (ATROVENT ) 0.03 % nasal spray Place 1 spray into both nostrils 3 (three) times daily as needed for rhinitis.   lidocaine  (LIDODERM ) 5 % Place 1 patch onto the skin daily. Remove & Discard patch within 12 hours or as directed by MD   losartan  (COZAAR ) 25 MG tablet Take 0.5 tablets (12.5 mg total) by mouth daily.   meclizine  (ANTIVERT ) 12.5 MG tablet Take 12.5 mg by mouth every 6 (six) hours as needed for dizziness.   Multiple Vitamin (MULTIVITAMIN WITH MINERALS) TABS tablet Take 2 tablets by mouth daily. Gummy   potassium chloride  SA (KLOR-CON  M) 20 MEQ tablet Take 1 tablet (20 mEq total) by mouth as needed (when taking the lasix  for swelling).   predniSONE  (DELTASONE ) 5 MG tablet Take 5 mg by mouth daily with breakfast.   rosuvastatin  (CRESTOR ) 5 MG tablet Take 1 tablet (5 mg total) by mouth every morning.   SALINE NASAL MIST NA  simethicone  (MYLICON) 80 MG chewable tablet Chew 160 mg by mouth every 6 (six) hours as needed for flatulence.

## 2024-09-29 ENCOUNTER — Ambulatory Visit (HOSPITAL_COMMUNITY)
Admission: RE | Admit: 2024-09-29 | Discharge: 2024-09-29 | Attending: Cardiovascular Disease | Admitting: Cardiovascular Disease

## 2024-09-29 ENCOUNTER — Ambulatory Visit (INDEPENDENT_AMBULATORY_CARE_PROVIDER_SITE_OTHER): Admitting: Physician Assistant

## 2024-09-29 VITALS — BP 128/62 | HR 76 | Ht <= 58 in | Wt 128.2 lb

## 2024-09-29 DIAGNOSIS — D594 Other nonautoimmune hemolytic anemias: Secondary | ICD-10-CM | POA: Insufficient documentation

## 2024-09-29 DIAGNOSIS — E782 Mixed hyperlipidemia: Secondary | ICD-10-CM | POA: Diagnosis not present

## 2024-09-29 DIAGNOSIS — I5032 Chronic diastolic (congestive) heart failure: Secondary | ICD-10-CM | POA: Insufficient documentation

## 2024-09-29 DIAGNOSIS — I350 Nonrheumatic aortic (valve) stenosis: Secondary | ICD-10-CM

## 2024-09-29 DIAGNOSIS — Z952 Presence of prosthetic heart valve: Secondary | ICD-10-CM | POA: Diagnosis not present

## 2024-09-29 DIAGNOSIS — I10 Essential (primary) hypertension: Secondary | ICD-10-CM | POA: Insufficient documentation

## 2024-09-29 LAB — ECHOCARDIOGRAM COMPLETE
AV Mean grad: 8 mmHg
AV Peak grad: 14.5 mmHg
Ao pk vel: 1.9 m/s
Area-P 1/2: 3.05 cm2
S' Lateral: 2.5 cm

## 2024-09-29 NOTE — Patient Instructions (Signed)
 Medication Instructions:  Your physician recommends that you continue on your current medications as directed. Please refer to the Current Medication list given to you today.  *If you need a refill on your cardiac medications before your next appointment, please call your pharmacy*  Lab Work: None needed If you have labs (blood work) drawn today and your tests are completely normal, you will receive your results only by: MyChart Message (if you have MyChart) OR A paper copy in the mail If you have any lab test that is abnormal or we need to change your treatment, we will call you to review the results.  Testing/Procedures: 09/25/2025 Your physician has requested that you have an echocardiogram. Echocardiography is a painless test that uses sound waves to create images of your heart. It provides your doctor with information about the size and shape of your heart and how well your hearts chambers and valves are working. This procedure takes approximately one hour. There are no restrictions for this procedure. Please do NOT wear cologne, perfume, aftershave, or lotions (deodorant is allowed). Please arrive 15 minutes prior to your appointment time.  Please note: We ask at that you not bring children with you during ultrasound (echo/ vascular) testing. Due to room size and safety concerns, children are not allowed in the ultrasound rooms during exams. Our front office staff cannot provide observation of children in our lobby area while testing is being conducted. An adult accompanying a patient to their appointment will only be allowed in the ultrasound room at the discretion of the ultrasound technician under special circumstances. We apologize for any inconvenience.   Follow-Up: At [facility], you and your health needs are our priority.  As part of our continuing mission to provide you with exceptional heart care, our providers are all part of one team.  This team includes your primary  Cardiologist (physician) and Advanced Practice Providers or APPs (Physician Assistants and Nurse Practitioners) who all work together to provide you with the care you need, when you need it.  Your next appointment:   12 month(s)  Provider:   Dorn Ross, MD    We recommend signing up for the patient portal called MyChart.  Sign up information is provided on this After Visit Summary.  MyChart is used to connect with patients for Virtual Visits (Telemedicine).  Patients are able to view lab/test results, encounter notes, upcoming appointments, etc.  Non-urgent messages can be sent to your provider as well.   To learn more about what you can do with MyChart, go to [email or web address].

## 2024-09-30 ENCOUNTER — Other Ambulatory Visit (HOSPITAL_COMMUNITY): Payer: Medicare HMO

## 2024-09-30 ENCOUNTER — Ambulatory Visit: Payer: Medicare HMO

## 2024-10-25 NOTE — Progress Notes (Signed)
 Aliena Ghrist                                          MRN: [id number]   10/25/2024   The [facility]BCI Quality Team Specialist reviewed this patient medical record for the purposes of chart review for care gap closure. The following were reviewed: abstraction for care gap closure-controlling blood pressure.    VBCI Quality Team

## 2024-12-15 ENCOUNTER — Inpatient Hospital Stay

## 2024-12-22 ENCOUNTER — Ambulatory Visit: Admitting: Oncology

## 2024-12-22 ENCOUNTER — Inpatient Hospital Stay: Admitting: Oncology

## 2025-07-05 ENCOUNTER — Ambulatory Visit: Admitting: Allergy & Immunology

## 2025-09-25 ENCOUNTER — Other Ambulatory Visit (HOSPITAL_COMMUNITY)

## 2025-09-25 ENCOUNTER — Ambulatory Visit (HOSPITAL_COMMUNITY)
